# Patient Record
Sex: Female | Born: 1995 | Race: White | Hispanic: No | Marital: Married | Smoking: Never smoker
Health system: Southern US, Community
[De-identification: ages and names within clinical notes are randomized; demographics above are authoritative.]

## PROBLEM LIST (undated history)

## (undated) ENCOUNTER — Inpatient Hospital Stay: Payer: Self-pay

## (undated) DIAGNOSIS — K219 Gastro-esophageal reflux disease without esophagitis: Secondary | ICD-10-CM

## (undated) DIAGNOSIS — M419 Scoliosis, unspecified: Secondary | ICD-10-CM

## (undated) DIAGNOSIS — T753XXA Motion sickness, initial encounter: Secondary | ICD-10-CM

## (undated) DIAGNOSIS — I7300 Raynaud's syndrome without gangrene: Secondary | ICD-10-CM

## (undated) DIAGNOSIS — M5126 Other intervertebral disc displacement, lumbar region: Secondary | ICD-10-CM

## (undated) DIAGNOSIS — G43909 Migraine, unspecified, not intractable, without status migrainosus: Secondary | ICD-10-CM

## (undated) DIAGNOSIS — M238X2 Other internal derangements of left knee: Secondary | ICD-10-CM

## (undated) DIAGNOSIS — O1490 Unspecified pre-eclampsia, unspecified trimester: Secondary | ICD-10-CM

## (undated) DIAGNOSIS — I493 Ventricular premature depolarization: Secondary | ICD-10-CM

## (undated) HISTORY — DX: Scoliosis, unspecified: M41.9

## (undated) HISTORY — DX: Migraine, unspecified, not intractable, without status migrainosus: G43.909

## (undated) HISTORY — PX: DILATION AND CURETTAGE OF UTERUS: SHX78

## (undated) HISTORY — DX: Raynaud's syndrome without gangrene: I73.00

## (undated) HISTORY — PX: WISDOM TOOTH EXTRACTION: SHX21

---
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
## 2014-06-24 ENCOUNTER — Ambulatory Visit (INDEPENDENT_AMBULATORY_CARE_PROVIDER_SITE_OTHER): Payer: 59 | Admitting: Obstetrics and Gynecology

## 2014-06-24 ENCOUNTER — Encounter: Payer: Self-pay | Admitting: Obstetrics and Gynecology

## 2014-06-24 VITALS — BP 118/77 | HR 86 | Ht 67.0 in | Wt 169.8 lb

## 2014-06-24 DIAGNOSIS — R110 Nausea: Secondary | ICD-10-CM | POA: Diagnosis not present

## 2014-06-24 DIAGNOSIS — N912 Amenorrhea, unspecified: Secondary | ICD-10-CM

## 2014-06-24 DIAGNOSIS — Z3201 Encounter for pregnancy test, result positive: Secondary | ICD-10-CM

## 2014-06-24 DIAGNOSIS — N911 Secondary amenorrhea: Secondary | ICD-10-CM

## 2014-06-24 LAB — POCT URINE PREGNANCY: PREG TEST UR: POSITIVE — AB

## 2014-06-24 NOTE — Progress Notes (Signed)
Subjective:    Sydney James is a 19 y.o. P0 female who presents for evaluation of amenorrhea. She believes she could be pregnant. Pregnancy is desired. Sexual Activity: single partner, contraception: none. Current symptoms also include: nausea and positive home pregnancy test. Last period was normal.   Patient's last menstrual period was 05/17/2014.   The following portions of the patient's history were reviewed and updated as appropriate: allergies, current medications, past family history, past medical history, past social history, past surgical history and problem list.  Review of Systems Pertinent items are noted in HPI.     Objective:    BP 118/77 mmHg  Pulse 86  Ht 5\' 7"  (1.702 m)  Wt 169 lb 12.8 oz (77.021 kg)  BMI 26.59 kg/m2  LMP 05/17/2014 General: alert and no acute distress    Lab Review Urine HCG: positive    Assessment:    Absence of menstruation.     Plan:    Pregnancy Test: Positive: EDC: 02/21/2015, ERGA 5.3 weeks.  Briefly discussed pre-natal care options. Encouraged well-balanced diet, plenty of rest when needed, pre-natal vitamins daily and walking for exercise. Discussed self-help for nausea, avoiding OTC medications until consulting provider or pharmacist, other than Tylenol as needed, minimal caffeine (1-2 cups daily) and avoiding alcohol. She will schedule her initial OB visit in the next month Feel free to call with any questions. Patient desires to be seen by midwife.  Will schedule NOB intake and prenatal care accordingly.     Hildred Laser, MD [facility]

## 2014-06-25 DIAGNOSIS — N911 Secondary amenorrhea: Secondary | ICD-10-CM | POA: Insufficient documentation

## 2014-06-25 NOTE — Patient Instructions (Signed)
First Trimester of Pregnancy The first trimester of pregnancy is from week 1 until the end of week 12 (months 1 through 3). A week after a sperm fertilizes an egg, the egg will implant on the wall of the uterus. This embryo will begin to develop into a baby. Genes from you and your partner are forming the baby. The female genes determine whether the baby is a boy or a girl. At 6-8 weeks, the eyes and face are formed, and the heartbeat can be seen on ultrasound. At the end of 12 weeks, all the baby's organs are formed.  Now that you are pregnant, you will want to do everything you can to have a healthy baby. Two of the most important things are to get good prenatal care and to follow your health care provider's instructions. Prenatal care is all the medical care you receive before the baby's birth. This care will help prevent, find, and treat any problems during the pregnancy and childbirth. BODY CHANGES Your body goes through many changes during pregnancy. The changes vary from woman to woman.   You may gain or lose a couple of pounds at first.  You may feel sick to your stomach (nauseous) and throw up (vomit). If the vomiting is uncontrollable, call your health care provider.  You may tire easily.  You may develop headaches that can be relieved by medicines approved by your health care provider.  You may urinate more often. Painful urination may mean you have a bladder infection.  You may develop heartburn as a result of your pregnancy.  You may develop constipation because certain hormones are causing the muscles that push waste through your intestines to slow down.  You may develop hemorrhoids or swollen, bulging veins (varicose veins).  Your breasts may begin to grow larger and become tender. Your nipples may stick out more, and the tissue that surrounds them (areola) may become darker.  Your gums may bleed and may be sensitive to brushing and flossing.  Dark spots or blotches (chloasma,  mask of pregnancy) may develop on your face. This will likely fade after the baby is born.  Your menstrual periods will stop.  You may have a loss of appetite.  You may develop cravings for certain kinds of food.  You may have changes in your emotions from day to day, such as being excited to be pregnant or being concerned that something may go wrong with the pregnancy and baby.  You may have more vivid and strange dreams.  You may have changes in your hair. These can include thickening of your hair, rapid growth, and changes in texture. Some women also have hair loss during or after pregnancy, or hair that feels dry or thin. Your hair will most likely return to normal after your baby is born. WHAT TO EXPECT AT YOUR PRENATAL VISITS During a routine prenatal visit:  You will be weighed to make sure you and the baby are growing normally.  Your blood pressure will be taken.  Your abdomen will be measured to track your baby's growth.  The fetal heartbeat will be listened to starting around week 10 or 12 of your pregnancy.  Test results from any previous visits will be discussed. Your health care provider may ask you:  How you are feeling.  If you are feeling the baby move.  If you have had any abnormal symptoms, such as leaking fluid, bleeding, severe headaches, or abdominal cramping.  If you have any questions. Other tests   that may be performed during your first trimester include:  Blood tests to find your blood type and to check for the presence of any previous infections. They will also be used to check for low iron levels (anemia) and Rh antibodies. Later in the pregnancy, blood tests for diabetes will be done along with other tests if problems develop.  Urine tests to check for infections, diabetes, or protein in the urine.  An ultrasound to confirm the proper growth and development of the baby.  An amniocentesis to check for possible genetic problems.  Fetal screens for  spina bifida and Down syndrome.  You may need other tests to make sure you and the baby are doing well. HOME CARE INSTRUCTIONS  Medicines  Follow your health care provider's instructions regarding medicine use. Specific medicines may be either safe or unsafe to take during pregnancy.  Take your prenatal vitamins as directed.  If you develop constipation, try taking a stool softener if your health care provider approves. Diet  Eat regular, well-balanced meals. Choose a variety of foods, such as meat or vegetable-based protein, fish, milk and low-fat dairy products, vegetables, fruits, and whole grain breads and cereals. Your health care provider will help you determine the amount of weight gain that is right for you.  Avoid raw meat and uncooked cheese. These carry germs that can cause birth defects in the baby.  Eating four or five small meals rather than three large meals a day may help relieve nausea and vomiting. If you start to feel nauseous, eating a few soda crackers can be helpful. Drinking liquids between meals instead of during meals also seems to help nausea and vomiting.  If you develop constipation, eat more high-fiber foods, such as fresh vegetables or fruit and whole grains. Drink enough fluids to keep your urine clear or pale yellow. Activity and Exercise  Exercise only as directed by your health care provider. Exercising will help you:  Control your weight.  Stay in shape.  Be prepared for labor and delivery.  Experiencing pain or cramping in the lower abdomen or low back is a good sign that you should stop exercising. Check with your health care provider before continuing normal exercises.  Try to avoid standing for long periods of time. Move your legs often if you must stand in one place for a long time.  Avoid heavy lifting.  Wear low-heeled shoes, and practice good posture.  You may continue to have sex unless your health care provider directs you  otherwise. Relief of Pain or Discomfort  Wear a good support bra for breast tenderness.   Take warm sitz baths to soothe any pain or discomfort caused by hemorrhoids. Use hemorrhoid cream if your health care provider approves.   Rest with your legs elevated if you have leg cramps or low back pain.  If you develop varicose veins in your legs, wear support hose. Elevate your feet for 15 minutes, 3-4 times a day. Limit salt in your diet. Prenatal Care  Schedule your prenatal visits by the twelfth week of pregnancy. They are usually scheduled monthly at first, then more often in the last 2 months before delivery.  Write down your questions. Take them to your prenatal visits.  Keep all your prenatal visits as directed by your health care provider. Safety  Wear your seat belt at all times when driving.  Make a list of emergency phone numbers, including numbers for family, friends, the hospital, and police and fire departments. General Tips    Ask your health care provider for a referral to a local prenatal education class. Begin classes no later than at the beginning of month 6 of your pregnancy.  Ask for help if you have counseling or nutritional needs during pregnancy. Your health care provider can offer advice or refer you to specialists for help with various needs.  Do not use hot tubs, steam rooms, or saunas.  Do not douche or use tampons or scented sanitary pads.  Do not cross your legs for long periods of time.  Avoid cat litter boxes and soil used by cats. These carry germs that can cause birth defects in the baby and possibly loss of the fetus by miscarriage or stillbirth.  Avoid all smoking, herbs, alcohol, and medicines not prescribed by your health care provider. Chemicals in these affect the formation and growth of the baby.  Schedule a dentist appointment. At home, brush your teeth with a soft toothbrush and be gentle when you floss. SEEK MEDICAL CARE IF:   You have  dizziness.  You have mild pelvic cramps, pelvic pressure, or nagging pain in the abdominal area.  You have persistent nausea, vomiting, or diarrhea.  You have a bad smelling vaginal discharge.  You have pain with urination.  You notice increased swelling in your face, hands, legs, or ankles. SEEK IMMEDIATE MEDICAL CARE IF:   You have a fever.  You are leaking fluid from your vagina.  You have spotting or bleeding from your vagina.  You have severe abdominal cramping or pain.  You have rapid weight gain or loss.  You vomit blood or material that looks like coffee grounds.  You are exposed to German measles and have never had them.  You are exposed to fifth disease or chickenpox.  You develop a severe headache.  You have shortness of breath.  You have any kind of trauma, such as from a fall or a car accident. Document Released: 12/14/2000 Document Revised: 05/06/2013 Document Reviewed: 10/30/2012 ExitCare Patient Information 2015 [employer]. This information is not intended to replace advice given to you by your health care provider. Make sure you discuss any questions you have with your health care provider.  

## 2014-07-15 ENCOUNTER — Ambulatory Visit: Payer: 59 | Admitting: Obstetrics and Gynecology

## 2014-07-15 VITALS — BP 123/73 | HR 83 | Wt 170.1 lb

## 2014-07-15 DIAGNOSIS — Z3491 Encounter for supervision of normal pregnancy, unspecified, first trimester: Secondary | ICD-10-CM

## 2014-07-15 LAB — OB RESULTS CONSOLE HEPATITIS B SURFACE ANTIGEN: Hepatitis B Surface Ag: NEGATIVE

## 2014-07-15 LAB — OB RESULTS CONSOLE GC/CHLAMYDIA
CHLAMYDIA, DNA PROBE: NEGATIVE
Gonorrhea: NEGATIVE

## 2014-07-15 LAB — OB RESULTS CONSOLE HIV ANTIBODY (ROUTINE TESTING): HIV: NONREACTIVE

## 2014-07-15 LAB — OB RESULTS CONSOLE ABO/RH: RH TYPE: POSITIVE

## 2014-07-15 LAB — OB RESULTS CONSOLE VARICELLA ZOSTER ANTIBODY, IGG: Varicella: IMMUNE

## 2014-07-15 LAB — OB RESULTS CONSOLE RUBELLA ANTIBODY, IGM: Rubella: IMMUNE

## 2014-07-15 NOTE — Progress Notes (Cosign Needed)
Pt is here for NOB nurse intake, all info reviewed with pt, pt is having some nausea declined medication at this time Will review info and let our office know if desires any 1st trimester screening test

## 2014-07-16 ENCOUNTER — Telehealth: Payer: Self-pay | Admitting: Obstetrics and Gynecology

## 2014-07-16 DIAGNOSIS — O219 Vomiting of pregnancy, unspecified: Secondary | ICD-10-CM

## 2014-07-16 LAB — CBC WITH DIFFERENTIAL/PLATELET
BASOS: 0 %
Basophils Absolute: 0 10*3/uL (ref 0.0–0.2)
EOS (ABSOLUTE): 0.2 10*3/uL (ref 0.0–0.4)
EOS: 2 %
Hematocrit: 39.4 % (ref 34.0–46.6)
Hemoglobin: 13.2 g/dL (ref 11.1–15.9)
IMMATURE GRANULOCYTES: 0 %
Immature Grans (Abs): 0 10*3/uL (ref 0.0–0.1)
LYMPHS ABS: 2.9 10*3/uL (ref 0.7–3.1)
Lymphs: 28 %
MCH: 29.5 pg (ref 26.6–33.0)
MCHC: 33.5 g/dL (ref 31.5–35.7)
MCV: 88 fL (ref 79–97)
Monocytes Absolute: 1 10*3/uL — ABNORMAL HIGH (ref 0.1–0.9)
Monocytes: 9 %
Neutrophils Absolute: 6.2 10*3/uL (ref 1.4–7.0)
Neutrophils: 61 %
PLATELETS: 317 10*3/uL (ref 150–379)
RBC: 4.47 x10E6/uL (ref 3.77–5.28)
RDW: 14 % (ref 12.3–15.4)
WBC: 10.3 10*3/uL (ref 3.4–10.8)

## 2014-07-16 LAB — URINALYSIS, ROUTINE W REFLEX MICROSCOPIC
BILIRUBIN UA: NEGATIVE
Glucose, UA: NEGATIVE
KETONES UA: NEGATIVE
Leukocytes, UA: NEGATIVE
Nitrite, UA: NEGATIVE
PH UA: 7 (ref 5.0–7.5)
PROTEIN UA: NEGATIVE
RBC, UA: NEGATIVE
Specific Gravity, UA: 1.022 (ref 1.005–1.030)
Urobilinogen, Ur: 0.2 mg/dL (ref 0.2–1.0)

## 2014-07-16 LAB — ABO AND RH: RH TYPE: POSITIVE

## 2014-07-16 LAB — ANTIBODY SCREEN: Antibody Screen: NEGATIVE

## 2014-07-16 MED ORDER — DOXYLAMINE-PYRIDOXINE 10-10 MG PO TBEC: 10.0000 mg | DELAYED_RELEASE_TABLET | Freq: Every day | ORAL | Status: DC

## 2014-07-16 NOTE — Telephone Encounter (Signed)
8 WK 5 DAYS PREGNANT, PT IS VERY NAUSEATED/ AND VOMITTING. SHE WOULD LIKE RX FOR IT. ([facility] [address])

## 2014-07-16 NOTE — Telephone Encounter (Signed)
RX for diclegis sent in. Please inform pt.

## 2014-07-17 LAB — TOXOPLASMA ANTIBODIES- IGG AND  IGM
Toxoplasma Antibody- IgM: <3 AU/mL (ref 0.0–7.9)
Toxoplasma IgG Ratio: <3 IU/mL (ref 0.0–7.1)

## 2014-07-17 LAB — RUBELLA SCREEN: Rubella Antibodies, IGG: 1.81 index (ref >0.99)

## 2014-07-17 LAB — GC/CHLAMYDIA PROBE AMP
Chlamydia trachomatis, NAA: NEGATIVE
NEISSERIA GONORRHOEAE BY PCR: NEGATIVE

## 2014-07-17 LAB — HEP, RPR, HIV PANEL: RPR: NONREACTIVE

## 2014-07-17 LAB — URINE CULTURE

## 2014-07-17 LAB — VARICELLA ZOSTER ANTIBODY, IGG: VARICELLA: 1339 {index} (ref >165)

## 2014-08-07 ENCOUNTER — Ambulatory Visit (INDEPENDENT_AMBULATORY_CARE_PROVIDER_SITE_OTHER): Payer: 59 | Admitting: Obstetrics and Gynecology

## 2014-08-07 ENCOUNTER — Encounter: Payer: Self-pay | Admitting: Obstetrics and Gynecology

## 2014-08-07 VITALS — BP 125/84 | HR 84 | Wt 169.3 lb

## 2014-08-07 DIAGNOSIS — Z3491 Encounter for supervision of normal pregnancy, unspecified, first trimester: Secondary | ICD-10-CM

## 2014-08-07 LAB — POCT URINALYSIS DIPSTICK
Bilirubin, UA: NEGATIVE
GLUCOSE UA: NEGATIVE
Ketones, UA: 5
LEUKOCYTES UA: NEGATIVE
NITRITE UA: NEGATIVE
PH UA: 5
Protein, UA: NEGATIVE
SPEC GRAV UA: 1.015
Urobilinogen, UA: 0.2

## 2014-08-07 NOTE — Progress Notes (Signed)
NEW OB HISTORY AND PHYSICAL  SUBJECTIVE:       Sydney James is a 19 y.o. G1P0 female, Patient's last menstrual period was 05/17/2014 (exact date)., Estimated Date of Delivery: 02/21/15, [redacted]w[redacted]d, presents today for establishment of Prenatal Care. She has no unusual complaints and complains of nausea with vomiting for >30 days      Gynecologic History Patient's last menstrual period was 05/17/2014 (exact date). Normal Contraception: none Last Pap: NA. Results were: NA  Obstetric History OB History  Gravida Para Term Preterm AB SAB TAB Ectopic Multiple Living  1             # Outcome Date GA Lbr Len/2nd Weight Sex Delivery Anes PTL Lv  1 Current               Past Medical History  Diagnosis Date  . Scoliosis     Back brace for 2 years  . Migraine     History reviewed. No pertinent past surgical history.  Current Outpatient Prescriptions on File Prior to Visit  Medication Sig Dispense Refill  . Prenatal Vit-Fe Fumarate-FA (MULTIVITAMIN-PRENATAL) 27-0.8 MG TABS tablet Take 1 tablet by mouth daily at 12 noon.    . Doxylamine-Pyridoxine 10-10 MG TBEC Take 10 mg by mouth at bedtime. Take 2 tablets at bedtime, if no relief may increase to taking 1 in the am, and 1 at noon (Patient not taking: Reported on 08/07/2014) 60 tablet 3   No current facility-administered medications on file prior to visit.    Allergies  Allergen Reactions  . Aloe   . Lubricants     Has to have water based lubricant  . Tape     History   Social History  . Marital Status: Married    Spouse Name: N/A  . Number of Children: N/A  . Years of Education: N/A   Occupational History  . Not on file.   Social History Main Topics  . Smoking status: Never Smoker   . Smokeless tobacco: Never Used  . Alcohol Use: No  . Drug Use: No  . Sexual Activity: Yes    Birth Control/ Protection: None     Comment: Pregnant   Other Topics Concern  . Not on file   Social History Narrative    Family History   Problem Relation Age of Onset  . Hypertension Father   . Hypertension Mother     The following portions of the patient's history were reviewed and updated as appropriate: allergies, current medications, past OB history, past medical history, past surgical history, past family history, past social history, and problem list.    OBJECTIVE: Initial Physical Exam (New OB)  GENERAL APPEARANCE: alert, well appearing, in no apparent distress, oriented to person, place and time, well hydrated HEAD: normocephalic, atraumatic MOUTH: mucous membranes moist, pharynx normal without lesions THYROID: no thyromegaly or masses present BREASTS: no masses noted, no significant tenderness, no palpable axillary nodes, no skin changes LUNGS: clear to auscultation, no wheezes, rales or rhonchi, symmetric air entry HEART: regular rate and rhythm, no murmurs ABDOMEN: soft, nontender, nondistended, no abnormal masses, no epigastric pain EXTREMITIES: no redness or tenderness in the calves or thighs SKIN: normal coloration and turgor, no rashes LYMPH NODES: no adenopathy palpable NEUROLOGIC: alert, oriented, normal speech, no focal findings or movement disorder noted  PELVIC EXAM EXTERNAL GENITALIA: normal appearing vulva with no masses, tenderness or lesions VAGINA: no abnormal discharge or lesions CERVIX: no lesions or cervical motion tenderness UTERUS: gravid and consistent  with 12 weeks ADNEXA: no masses palpable and nontender  ASSESSMENT: Normal pregnancy  PLAN:  [employer] obtained Prenatal care See orders

## 2014-08-07 NOTE — Progress Notes (Signed)
Pt is having some nausea, has Rx at pharmacy just hasnt picked it up

## 2014-08-08 ENCOUNTER — Other Ambulatory Visit: Payer: Self-pay | Admitting: Obstetrics and Gynecology

## 2014-08-08 MED ORDER — ONDANSETRON 4 MG PO TBDP: 4.0000 mg | ORAL_TABLET | Freq: Four times a day (QID) | ORAL | Status: DC | PRN

## 2014-08-15 ENCOUNTER — Encounter: Payer: Self-pay | Admitting: Obstetrics and Gynecology

## 2014-08-19 LAB — CYSTIC FIBROSIS DIAGNOSTIC STUDY: Interpretation-CFDNA:: NEGATIVE

## 2014-08-22 ENCOUNTER — Encounter: Payer: Self-pay | Admitting: Obstetrics and Gynecology

## 2014-08-26 ENCOUNTER — Encounter: Payer: Self-pay | Admitting: Emergency Medicine

## 2014-08-26 ENCOUNTER — Emergency Department
Admission: EM | Admit: 2014-08-26 | Discharge: 2014-08-26 | Disposition: A | Discharge status: Home / Self Care | Payer: 59 | Attending: Emergency Medicine | Admitting: Emergency Medicine

## 2014-08-26 DIAGNOSIS — O210 Mild hyperemesis gravidarum: Secondary | ICD-10-CM | POA: Diagnosis not present

## 2014-08-26 DIAGNOSIS — Z3A14 14 weeks gestation of pregnancy: Secondary | ICD-10-CM | POA: Insufficient documentation

## 2014-08-26 LAB — URINALYSIS COMPLETE WITH MICROSCOPIC (ARMC ONLY)
BILIRUBIN URINE: NEGATIVE
Glucose, UA: NEGATIVE mg/dL
Hgb urine dipstick: NEGATIVE
KETONES UR: NEGATIVE mg/dL
LEUKOCYTES UA: NEGATIVE
Nitrite: NEGATIVE
PH: 6 (ref 5.0–8.0)
PROTEIN: NEGATIVE mg/dL
SPECIFIC GRAVITY, URINE: 1.008 (ref 1.005–1.030)

## 2014-08-26 LAB — CBC
HEMATOCRIT: 38.3 % (ref 35.0–47.0)
HEMOGLOBIN: 12.7 g/dL (ref 12.0–16.0)
MCH: 29.3 pg (ref 26.0–34.0)
MCHC: 33.2 g/dL (ref 32.0–36.0)
MCV: 88 fL (ref 80.0–100.0)
Platelets: 249 10*3/uL (ref 150–440)
RBC: 4.35 MIL/uL (ref 3.80–5.20)
RDW: 13.1 % (ref 11.5–14.5)
WBC: 10.7 10*3/uL (ref 3.6–11.0)

## 2014-08-26 LAB — COMPREHENSIVE METABOLIC PANEL
ALK PHOS: 59 U/L (ref 38–126)
ALT: 17 U/L (ref 14–54)
ANION GAP: 9 (ref 5–15)
AST: 21 U/L (ref 15–41)
Albumin: 3.9 g/dL (ref 3.5–5.0)
BILIRUBIN TOTAL: 0.2 mg/dL — AB (ref 0.3–1.2)
BUN: 8 mg/dL (ref 6–20)
CALCIUM: 9.3 mg/dL (ref 8.9–10.3)
CO2: 24 mmol/L (ref 22–32)
Chloride: 104 mmol/L (ref 101–111)
Creatinine, Ser: 0.51 mg/dL (ref 0.44–1.00)
GLUCOSE: 99 mg/dL (ref 65–99)
POTASSIUM: 3.3 mmol/L — AB (ref 3.5–5.1)
Sodium: 137 mmol/L (ref 135–145)
TOTAL PROTEIN: 7.4 g/dL (ref 6.5–8.1)

## 2014-08-26 MED ORDER — METOCLOPRAMIDE HCL 5 MG/ML IJ SOLN
10.0000 mg | Freq: Once | INTRAMUSCULAR | Status: AC
  Administered 2014-08-26: 10 mg via INTRAVENOUS
  Filled 2014-08-26: qty 2

## 2014-08-26 MED ORDER — SODIUM CHLORIDE 0.9 % IV SOLN
Freq: Once | INTRAVENOUS | Status: AC
  Administered 2014-08-26: 19:00:00 via INTRAVENOUS

## 2014-08-26 MED ORDER — METOCLOPRAMIDE HCL 10 MG PO TABS: 10.0000 mg | ORAL_TABLET | Freq: Four times a day (QID) | ORAL | Status: DC | PRN

## 2014-08-26 NOTE — ED Notes (Signed)
Pt to ed with c/o vomiting x 2 days, unable to hold fluids down for last 2 days.  Pt reports she is approx [redacted] weeks pregnant.  Reports she has tried diclegis without relief.

## 2014-08-26 NOTE — Discharge Instructions (Signed)
Hyperemesis Gravidarum °Hyperemesis gravidarum is a severe form of nausea and vomiting that happens during pregnancy. Hyperemesis is worse than morning sickness. It may cause you to have nausea or vomiting all day for many days. It may keep you from eating and drinking enough food and liquids. Hyperemesis usually occurs during the first half (the first 20 weeks) of pregnancy. It often goes away once a woman is in her second half of pregnancy. However, sometimes hyperemesis continues through an entire pregnancy.  °CAUSES  °The cause of this condition is not completely known but is thought to be related to changes in the body's hormones when pregnant. It could be from the high level of the pregnancy hormone or an increase in estrogen in the body.  °SIGNS AND SYMPTOMS  °· Severe nausea and vomiting. °· Nausea that does not go away. °· Vomiting that does not allow you to keep any food down. °· Weight loss and body fluid loss (dehydration). °· Having no desire to eat or not liking food you have previously enjoyed. °DIAGNOSIS  °Your health care provider will do a physical exam and ask you about your symptoms. He or she may also order blood tests and urine tests to make sure something else is not causing the problem.  °TREATMENT  °You may only need medicine to control the problem. If medicines do not control the nausea and vomiting, you will be treated in the hospital to prevent dehydration, increased acid in the blood (acidosis), weight loss, and changes in the electrolytes in your body that may harm the unborn baby (fetus). You may need IV fluids.  °HOME CARE INSTRUCTIONS  °· Only take over-the-counter or prescription medicines as directed by your health care provider. °· Try eating a couple of dry crackers or toast in the morning before getting out of bed. °· Avoid foods and smells that upset your stomach. °· Avoid fatty and spicy foods. °· Eat 5-6 small meals a day. °· Do not drink when eating meals. Drink between  meals. °· For snacks, eat high-protein foods, such as cheese. °· Eat or suck on things that have ginger in them. Ginger helps nausea. °· Avoid food preparation. The smell of food can spoil your appetite. °· Avoid iron pills and iron in your multivitamins until after 3-4 months of being pregnant. However, consult with your health care provider before stopping any prescribed iron pills. °SEEK MEDICAL CARE IF:  °· Your abdominal pain increases. °· You have a severe headache. °· You have vision problems. °· You are losing weight. °SEEK IMMEDIATE MEDICAL CARE IF:  °· You are unable to keep fluids down. °· You vomit blood. °· You have constant nausea and vomiting. °· You have excessive weakness. °· You have extreme thirst. °· You have dizziness or fainting. °· You have a fever or persistent symptoms for more than 2-3 days. °· You have a fever and your symptoms suddenly get worse. °MAKE SURE YOU:  °· Understand these instructions. °· Will watch your condition. °· Will get help right away if you are not doing well or get worse. °Document Released: 12/20/2004 Document Revised: 10/10/2012 Document Reviewed: 08/01/2012 °ExitCare® Patient Information ©2015 [employer]. This information is not intended to replace advice given to you by your health care provider. Make sure you discuss any questions you have with your health care provider. ° °

## 2014-08-26 NOTE — ED Provider Notes (Signed)
[facility] Provider Note  ____________________________________________  Time seen: Approximately 650 PM  I have reviewed the triage vital signs and the nursing notes.   HISTORY  Chief Complaint Emesis During Pregnancy    HPI Sydney James is a 19 y.o. female who is [redacted] weeks pregnant who is coming in with 3 days of worsening nausea and vomiting. The patient says that she is unable to keep any fluids or solids down. Is taking Dike Cletus at home without any relief. Denies any abdominal pain, vaginal discharge or bleeding. She is a G1. Has follow-up with an obstetrician gynecologist. She is also taking prenatal vitamins.Fetal heart tones documented at 146.   Past Medical History  Diagnosis Date  . Scoliosis     Back brace for 2 years  . Migraine     Patient Active Problem List   Diagnosis Date Noted  . Amenorrhea, secondary 06/25/2014    History reviewed. No pertinent past surgical history.  Current Outpatient Rx  Name  Route  Sig  Dispense  Refill  . Doxylamine-Pyridoxine 10-10 MG TBEC   Oral   Take 10 mg by mouth at bedtime. Take 2 tablets at bedtime, if no relief may increase to taking 1 in the am, and 1 at noon Patient not taking: Reported on 08/07/2014   60 tablet   3   . ondansetron (ZOFRAN ODT) 4 MG disintegrating tablet   Oral   Take 1 tablet (4 mg total) by mouth every 6 (six) hours as needed for nausea.   20 tablet   0   . Prenatal Vit-Fe Fumarate-FA (MULTIVITAMIN-PRENATAL) 27-0.8 MG TABS tablet   Oral   Take 1 tablet by mouth daily at 12 noon.           Allergies Aloe; Lubricants; and Tape  Family History  Problem Relation Age of Onset  . Hypertension Father   . Hypertension Mother     Social History Social History  Substance Use Topics  . Smoking status: Never Smoker   . Smokeless tobacco: Never Used  . Alcohol Use: No    Review of Systems Constitutional: No fever/chills Eyes: No visual  changes. ENT: No sore throat. Cardiovascular: Denies chest pain. Respiratory: Denies shortness of breath. Gastrointestinal: No abdominal pain. No diarrhea.  No constipation. Genitourinary: Negative for dysuria. Musculoskeletal: Negative for back pain. Skin: Negative for rash. Neurological: Negative for headaches, focal weakness or numbness.  10-point ROS otherwise negative.  ____________________________________________   PHYSICAL EXAM:  VITAL SIGNS: ED Triage Vitals  Enc Vitals Group     BP 08/26/14 1752 125/81 mmHg     Pulse Rate 08/26/14 1752 87     Resp 08/26/14 1752 20     Temp 08/26/14 1752 97.7 F (36.5 C)     Temp Source 08/26/14 1752 Oral     SpO2 08/26/14 1752 98 %     Weight 08/26/14 1752 158 lb (71.668 kg)     Height 08/26/14 1752  (1.702 m)     Head Cir --      Peak Flow --      Pain Score 08/26/14 1753 0     Pain Loc --      Pain Edu? --      Excl. in GC? --     Constitutional: Alert and oriented. Well appearing and in no acute distress. Eyes: Conjunctivae are normal. PERRL. EOMI. Head: Atraumatic. Nose: No congestion/rhinnorhea. Mouth/Throat: Mucous membranes are moist.  Oropharynx non-erythematous. Neck: No stridor.  Cardiovascular: Normal rate, regular rhythm. Grossly normal heart sounds.  Good peripheral circulation. Respiratory: Normal respiratory effort.  No retractions. Lungs CTAB. Gastrointestinal: Soft and nontender. No distention. No abdominal bruits. No CVA tenderness. Musculoskeletal: No lower extremity tenderness nor edema.  No joint effusions. Neurologic:  Normal speech and language. No gross focal neurologic deficits are appreciated. No gait instability. Skin:  Skin is warm, dry and intact. No rash noted. Psychiatric: Mood and affect are normal. Speech and behavior are normal.  ____________________________________________   LABS (all labs ordered are listed, but only abnormal results are displayed)  Labs Reviewed   COMPREHENSIVE METABOLIC PANEL - Abnormal; Notable for the following:    Potassium 3.3 (*)    Total Bilirubin 0.2 (*)    All other components within normal limits  URINALYSIS COMPLETEWITH MICROSCOPIC ([facility] ONLY) - Abnormal; Notable for the following:    Color, Urine YELLOW (*)    APPearance HAZY (*)    Bacteria, UA RARE (*)    Squamous Epithelial / LPF 6-30 (*)    All other components within normal limits  CBC   ____________________________________________  EKG   ____________________________________________  RADIOLOGY   ____________________________________________   PROCEDURES   ____________________________________________   INITIAL IMPRESSION / ASSESSMENT AND PLAN / ED COURSE  Pertinent labs & imaging results that were available during my care of the patient were reviewed by me and considered in my medical decision making (see chart for details).  ----------------------------------------- 8:04 PM on 08/26/2014 -----------------------------------------  Patient without any nausea at this time. Now tolerating crackers and juice. To continue diclegis and prenatal vitamins at home.  Will rx reglan prn.  To follow up with ob/gyn as scheduled on September 1. ____________________________________________   FINAL CLINICAL IMPRESSION(S) / ED DIAGNOSES  Acute hyperemesis gravidarum, resolved. Initial visit.    Myrna Blazer, MD 08/26/14 2006

## 2014-09-01 ENCOUNTER — Telehealth: Payer: Self-pay | Admitting: Obstetrics and Gynecology

## 2014-09-01 NOTE — Telephone Encounter (Signed)
Pt called and she has not had an ultrasound since she started coming her, she is about 15 weeks her next appt is 9/1. She has talked to other people that come there and they have said they got an ultrasound sooner and she wanted to know if she could get one just to see the baby, I explained to her that Insurance will not pay for her to have an [place] if she just wants to see the baby and I explained to her that she will probably have one in 4 weeks after her 9/1 visit due to it being time for her anatomy scan and I also explained to her that every provider here is different and taht you do not do an [place] unless its medically necessary. Pt is not having any problems no bleeding or cramping she just wants to see the baby.

## 2014-09-04 ENCOUNTER — Ambulatory Visit (INDEPENDENT_AMBULATORY_CARE_PROVIDER_SITE_OTHER): Payer: 59 | Admitting: Obstetrics and Gynecology

## 2014-09-04 ENCOUNTER — Encounter: Payer: Self-pay | Admitting: Obstetrics and Gynecology

## 2014-09-04 VITALS — BP 115/52 | HR 75 | Wt 167.8 lb

## 2014-09-04 DIAGNOSIS — Z331 Pregnant state, incidental: Secondary | ICD-10-CM

## 2014-09-04 LAB — POCT URINALYSIS DIPSTICK
Bilirubin, UA: NEGATIVE
Blood, UA: NEGATIVE
Glucose, UA: NEGATIVE
Ketones, UA: NEGATIVE
LEUKOCYTES UA: NEGATIVE
NITRITE UA: NEGATIVE
PH UA: 6
PROTEIN UA: NEGATIVE
Spec Grav, UA: 1.015
Urobilinogen, UA: 0.2

## 2014-09-04 NOTE — Progress Notes (Signed)
[name]-only needing Zofran 1-2 x day, to try to increase water intake; reviewed labs all normal, anatomy scan next visit.

## 2014-09-04 NOTE — Progress Notes (Signed)
[name]-still having some nausea, is taking the Zofran, headaches, is having some constipation

## 2014-09-04 NOTE — Patient Instructions (Signed)
Second Trimester of Pregnancy The second trimester is from week 13 through week 28, months 4 through 6. The second trimester is often a time when you feel your best. Your body has also adjusted to being pregnant, and you begin to feel better physically. Usually, morning sickness has lessened or quit completely, you may have more energy, and you may have an increase in appetite. The second trimester is also a time when the fetus is growing rapidly. At the end of the sixth month, the fetus is about 9 inches long and weighs about 1 pounds. You will likely begin to feel the baby move (quickening) between 18 and 20 weeks of the pregnancy. BODY CHANGES Your body goes through many changes during pregnancy. The changes vary from woman to woman.   Your weight will continue to increase. You will notice your lower abdomen bulging out.  You may begin to get stretch marks on your hips, abdomen, and breasts.  You may develop headaches that can be relieved by medicines approved by your health care provider.  You may urinate more often because the fetus is pressing on your bladder.  You may develop or continue to have heartburn as a result of your pregnancy.  You may develop constipation because certain hormones are causing the muscles that push waste through your intestines to slow down.  You may develop hemorrhoids or swollen, bulging veins (varicose veins).  You may have back pain because of the weight gain and pregnancy hormones relaxing your joints between the bones in your pelvis and as a result of a shift in weight and the muscles that support your balance.  Your breasts will continue to grow and be tender.  Your gums may bleed and may be sensitive to brushing and flossing.  Dark spots or blotches (chloasma, mask of pregnancy) may develop on your face. This will likely fade after the baby is born.  A dark line from your belly button to the pubic area (linea nigra) may appear. This will likely fade  after the baby is born.  You may have changes in your hair. These can include thickening of your hair, rapid growth, and changes in texture. Some women also have hair loss during or after pregnancy, or hair that feels dry or thin. Your hair will most likely return to normal after your baby is born. WHAT TO EXPECT AT YOUR PRENATAL VISITS During a routine prenatal visit:  You will be weighed to make sure you and the fetus are growing normally.  Your blood pressure will be taken.  Your abdomen will be measured to track your baby's growth.  The fetal heartbeat will be listened to.  Any test results from the previous visit will be discussed. Your health care provider may ask you:  How you are feeling.  If you are feeling the baby move.  If you have had any abnormal symptoms, such as leaking fluid, bleeding, severe headaches, or abdominal cramping.  If you have any questions. Other tests that may be performed during your second trimester include:  Blood tests that check for:  Low iron levels (anemia).  Gestational diabetes (between 24 and 28 weeks).  Rh antibodies.  Urine tests to check for infections, diabetes, or protein in the urine.  An ultrasound to confirm the proper growth and development of the baby.  An amniocentesis to check for possible genetic problems.  Fetal screens for spina bifida and Down syndrome. HOME CARE INSTRUCTIONS   Avoid all smoking, herbs, alcohol, and unprescribed   drugs. These chemicals affect the formation and growth of the baby.  Follow your health care provider's instructions regarding medicine use. There are medicines that are either safe or unsafe to take during pregnancy.  Exercise only as directed by your health care provider. Experiencing uterine cramps is a good sign to stop exercising.  Continue to eat regular, healthy meals.  Wear a good support bra for breast tenderness.  Do not use hot tubs, steam rooms, or saunas.  Wear your  seat belt at all times when driving.  Avoid raw meat, uncooked cheese, cat litter boxes, and soil used by cats. These carry germs that can cause birth defects in the baby.  Take your prenatal vitamins.  Try taking a stool softener (if your health care provider approves) if you develop constipation. Eat more high-fiber foods, such as fresh vegetables or fruit and whole grains. Drink plenty of fluids to keep your urine clear or pale yellow.  Take warm sitz baths to soothe any pain or discomfort caused by hemorrhoids. Use hemorrhoid cream if your health care provider approves.  If you develop varicose veins, wear support hose. Elevate your feet for 15 minutes, 3-4 times a day. Limit salt in your diet.  Avoid heavy lifting, wear low heel shoes, and practice good posture.  Rest with your legs elevated if you have leg cramps or low back pain.  Visit your dentist if you have not gone yet during your pregnancy. Use a soft toothbrush to brush your teeth and be gentle when you floss.  A sexual relationship may be continued unless your health care provider directs you otherwise.  Continue to go to all your prenatal visits as directed by your health care provider. SEEK MEDICAL CARE IF:   You have dizziness.  You have mild pelvic cramps, pelvic pressure, or nagging pain in the abdominal area.  You have persistent nausea, vomiting, or diarrhea.  You have a bad smelling vaginal discharge.  You have pain with urination. SEEK IMMEDIATE MEDICAL CARE IF:   You have a fever.  You are leaking fluid from your vagina.  You have spotting or bleeding from your vagina.  You have severe abdominal cramping or pain.  You have rapid weight gain or loss.  You have shortness of breath with chest pain.  You notice sudden or extreme swelling of your face, hands, ankles, feet, or legs.  You have not felt your baby move in over an hour.  You have severe headaches that do not go away with  medicine.  You have vision changes. Document Released: 12/14/2000 Document Revised: 12/25/2012 Document Reviewed: 02/21/2012 ExitCare Patient Information 2015 [employer]. This information is not intended to replace advice given to you by your health care provider. Make sure you discuss any questions you have with your health care provider.  

## 2014-09-15 ENCOUNTER — Ambulatory Visit (INDEPENDENT_AMBULATORY_CARE_PROVIDER_SITE_OTHER): Payer: 59 | Admitting: Obstetrics and Gynecology

## 2014-09-15 DIAGNOSIS — Z23 Encounter for immunization: Secondary | ICD-10-CM

## 2014-09-25 ENCOUNTER — Other Ambulatory Visit: Payer: Self-pay | Admitting: *Deleted

## 2014-09-25 MED ORDER — ONDANSETRON 4 MG PO TBDP: 4.0000 mg | ORAL_TABLET | Freq: Four times a day (QID) | ORAL | Status: DC | PRN

## 2014-09-30 ENCOUNTER — Ambulatory Visit: Payer: 59

## 2014-09-30 ENCOUNTER — Encounter: Payer: Self-pay | Admitting: Obstetrics and Gynecology

## 2014-09-30 ENCOUNTER — Ambulatory Visit (INDEPENDENT_AMBULATORY_CARE_PROVIDER_SITE_OTHER): Payer: 59 | Admitting: Obstetrics and Gynecology

## 2014-09-30 VITALS — BP 105/72 | HR 76 | Wt 170.6 lb

## 2014-09-30 DIAGNOSIS — Z3492 Encounter for supervision of normal pregnancy, unspecified, second trimester: Secondary | ICD-10-CM

## 2014-09-30 DIAGNOSIS — Z331 Pregnant state, incidental: Secondary | ICD-10-CM | POA: Diagnosis not present

## 2014-09-30 LAB — POCT URINALYSIS DIPSTICK
Bilirubin, UA: NEGATIVE
Glucose, UA: NEGATIVE
Ketones, UA: NEGATIVE
Leukocytes, UA: NEGATIVE
NITRITE UA: NEGATIVE
PH UA: 7
PROTEIN UA: NEGATIVE
RBC UA: NEGATIVE
SPEC GRAV UA: 1.015
UROBILINOGEN UA: 0.2

## 2014-09-30 NOTE — Patient Instructions (Signed)
Second Trimester of Pregnancy The second trimester is from week 13 through week 28, months 4 through 6. The second trimester is often a time when you feel your best. Your body has also adjusted to being pregnant, and you begin to feel better physically. Usually, morning sickness has lessened or quit completely, you may have more energy, and you may have an increase in appetite. The second trimester is also a time when the fetus is growing rapidly. At the end of the sixth month, the fetus is about 9 inches long and weighs about 1 pounds. You will likely begin to feel the baby move (quickening) between 18 and 20 weeks of the pregnancy. BODY CHANGES Your body goes through many changes during pregnancy. The changes vary from woman to woman.   Your weight will continue to increase. You will notice your lower abdomen bulging out.  You may begin to get stretch marks on your hips, abdomen, and breasts.  You may develop headaches that can be relieved by medicines approved by your health care provider.  You may urinate more often because the fetus is pressing on your bladder.  You may develop or continue to have heartburn as a result of your pregnancy.  You may develop constipation because certain hormones are causing the muscles that push waste through your intestines to slow down.  You may develop hemorrhoids or swollen, bulging veins (varicose veins).  You may have back pain because of the weight gain and pregnancy hormones relaxing your joints between the bones in your pelvis and as a result of a shift in weight and the muscles that support your balance.  Your breasts will continue to grow and be tender.  Your gums may bleed and may be sensitive to brushing and flossing.  Dark spots or blotches (chloasma, mask of pregnancy) may develop on your face. This will likely fade after the baby is born.  A dark line from your belly button to the pubic area (linea nigra) may appear. This will likely fade  after the baby is born.  You may have changes in your hair. These can include thickening of your hair, rapid growth, and changes in texture. Some women also have hair loss during or after pregnancy, or hair that feels dry or thin. Your hair will most likely return to normal after your baby is born. WHAT TO EXPECT AT YOUR PRENATAL VISITS During a routine prenatal visit:  You will be weighed to make sure you and the fetus are growing normally.  Your blood pressure will be taken.  Your abdomen will be measured to track your baby's growth.  The fetal heartbeat will be listened to.  Any test results from the previous visit will be discussed. Your health care provider may ask you:  How you are feeling.  If you are feeling the baby move.  If you have had any abnormal symptoms, such as leaking fluid, bleeding, severe headaches, or abdominal cramping.  If you have any questions. Other tests that may be performed during your second trimester include:  Blood tests that check for:  Low iron levels (anemia).  Gestational diabetes (between 24 and 28 weeks).  Rh antibodies.  Urine tests to check for infections, diabetes, or protein in the urine.  An ultrasound to confirm the proper growth and development of the baby.  An amniocentesis to check for possible genetic problems.  Fetal screens for spina bifida and Down syndrome. HOME CARE INSTRUCTIONS   Avoid all smoking, herbs, alcohol, and unprescribed   drugs. These chemicals affect the formation and growth of the baby.  Follow your health care provider's instructions regarding medicine use. There are medicines that are either safe or unsafe to take during pregnancy.  Exercise only as directed by your health care provider. Experiencing uterine cramps is a good sign to stop exercising.  Continue to eat regular, healthy meals.  Wear a good support bra for breast tenderness.  Do not use hot tubs, steam rooms, or saunas.  Wear your  seat belt at all times when driving.  Avoid raw meat, uncooked cheese, cat litter boxes, and soil used by cats. These carry germs that can cause birth defects in the baby.  Take your prenatal vitamins.  Try taking a stool softener (if your health care provider approves) if you develop constipation. Eat more high-fiber foods, such as fresh vegetables or fruit and whole grains. Drink plenty of fluids to keep your urine clear or pale yellow.  Take warm sitz baths to soothe any pain or discomfort caused by hemorrhoids. Use hemorrhoid cream if your health care provider approves.  If you develop varicose veins, wear support hose. Elevate your feet for 15 minutes, 3-4 times a day. Limit salt in your diet.  Avoid heavy lifting, wear low heel shoes, and practice good posture.  Rest with your legs elevated if you have leg cramps or low back pain.  Visit your dentist if you have not gone yet during your pregnancy. Use a soft toothbrush to brush your teeth and be gentle when you floss.  A sexual relationship may be continued unless your health care provider directs you otherwise.  Continue to go to all your prenatal visits as directed by your health care provider. SEEK MEDICAL CARE IF:   You have dizziness.  You have mild pelvic cramps, pelvic pressure, or nagging pain in the abdominal area.  You have persistent nausea, vomiting, or diarrhea.  You have a bad smelling vaginal discharge.  You have pain with urination. SEEK IMMEDIATE MEDICAL CARE IF:   You have a fever.  You are leaking fluid from your vagina.  You have spotting or bleeding from your vagina.  You have severe abdominal cramping or pain.  You have rapid weight gain or loss.  You have shortness of breath with chest pain.  You notice sudden or extreme swelling of your face, hands, ankles, feet, or legs.  You have not felt your baby move in over an hour.  You have severe headaches that do not go away with  medicine.  You have vision changes. Document Released: 12/14/2000 Document Revised: 12/25/2012 Document Reviewed: 02/21/2012 ExitCare Patient Information 2015 [employer]. This information is not intended to replace advice given to you by your health care provider. Make sure you discuss any questions you have with your health care provider.  

## 2014-09-30 NOTE — Progress Notes (Signed)
ULTRASOUND REPORT  Location: [facility] Date of Service:   Indications:Anatomy U/S Findings:  Singleton intrauterine pregnancy is visualized with FHR at 152 BPM. Biometrics give an (U/S) Gestational age of 36 3/7 weeks and an (U/S) EDD of 02/28/2015; this correlates with the clinically established EDD of 02/21/2015.  Fetal presentation is Vertex.  EFW: 242 g, 9 oz. Placenta: posterior and remote from the cervix. AFI: appears adequate.  Anatomic survey is incomplete and normal; Gender - female  .   Left kidney is suboptimal- probable visualization of renal arteries. DA, AA, T spine, C spine, face, nose/lips, and brain anatomy were not obtained due to fetal position.   Survey of the adnexa demonstrates no adnexal masses. There is no free peritoneal fluid in the cul de sac.  Impression: 1. 18 3/7 week Viable Singleton Intrauterine pregnancy by U/S. 2. (U/S) EDD is consistent with Clinically established (LMP) EDD of 02/21/2015. 3. Incomplete Anatomy Scan  Recommendations: 1.Clinical correlation with the patient's History and Physical Exam. 2. Follow up ultrasound in 3 weeks  Lewis,Amber, [employer] reviewed and agree with findings.  Counseled patient at today's visit  Yolanda Bonine, CNM

## 2014-09-30 NOTE — Progress Notes (Signed)
[name]-denies any new complaints, Zofran is helping some with nausea

## 2014-10-28 ENCOUNTER — Ambulatory Visit: Payer: 59

## 2014-10-28 ENCOUNTER — Encounter: Payer: Self-pay | Admitting: Obstetrics and Gynecology

## 2014-10-28 ENCOUNTER — Ambulatory Visit (INDEPENDENT_AMBULATORY_CARE_PROVIDER_SITE_OTHER): Payer: 59 | Admitting: Obstetrics and Gynecology

## 2014-10-28 VITALS — BP 119/72 | HR 79 | Wt 179.2 lb

## 2014-10-28 DIAGNOSIS — Z3492 Encounter for supervision of normal pregnancy, unspecified, second trimester: Secondary | ICD-10-CM

## 2014-10-28 DIAGNOSIS — O283 Abnormal ultrasonic finding on antenatal screening of mother: Secondary | ICD-10-CM

## 2014-10-28 DIAGNOSIS — O289 Unspecified abnormal findings on antenatal screening of mother: Secondary | ICD-10-CM

## 2014-10-28 LAB — POCT URINALYSIS DIPSTICK
BILIRUBIN UA: NEGATIVE
Glucose, UA: NEGATIVE
Ketones, UA: NEGATIVE
LEUKOCYTES UA: NEGATIVE
NITRITE UA: NEGATIVE
PH UA: 6
PROTEIN UA: NEGATIVE
RBC UA: NEGATIVE
Spec Grav, UA: 1.02
Urobilinogen, UA: 0.2

## 2014-10-28 NOTE — Progress Notes (Signed)
Indications:F/U Anatomy  Findings:  Mason JimSingleton intrauterine pregnancy is visualized with FHR at 152 BPM. Biometrics give an (U/S) Gestational age of [redacted] weeks 3 days and an (U/S) EDD of 02/28/15; this correlates with the clinically established EDD of 02/21/15.  Fetal presentation is Breech.  EFW: 5.2g (1lb 2 oz). Placenta: posterior. MVP: 3.9cm.  Anatomic survey is complete; Gender - female . The left kidney was not visualized.  Both renal arteries were visualized using color flow doppler.     There is no free peritoneal fluid in the cul de sac.  Impression: 1. 22 week 3 day Viable Singleton Intrauterine pregnancy by U/S. 2. (U/S) EDD is consistent with Clinically established (LMP) EDD of 02/21/15. 3. The left kidney was not visualized.  Both renal arteries were visualized using color flow doppler.  Recommendations: 1.Clinical correlation with the patient's History and Physical Exam.   Boyce MediciMaria E Hill  Scan reviewed and agree with findings Reviewed with patient- [facility]  Tacia Hindley Ines BloomerBurr, CNM

## 2014-10-28 NOTE — Progress Notes (Signed)
[name]- pt is feeling some better from the nausea, some hip pain, otherwise doing well

## 2014-11-04 ENCOUNTER — Other Ambulatory Visit: Payer: Self-pay | Admitting: Obstetrics and Gynecology

## 2014-11-04 DIAGNOSIS — O283 Abnormal ultrasonic finding on antenatal screening of mother: Secondary | ICD-10-CM

## 2014-11-13 ENCOUNTER — Ambulatory Visit (HOSPITAL_BASED_OUTPATIENT_CLINIC_OR_DEPARTMENT_OTHER)
Admission: RE | Admit: 2014-11-13 | Discharge: 2014-11-13 | Disposition: A | Discharge status: Home / Self Care | Payer: 59 | Source: Ambulatory Visit | Attending: Obstetrics and Gynecology | Admitting: Obstetrics and Gynecology

## 2014-11-13 ENCOUNTER — Ambulatory Visit
Admission: RE | Admit: 2014-11-13 | Discharge: 2014-11-13 | Disposition: A | Discharge status: Home / Self Care | Payer: 59 | Source: Ambulatory Visit | Attending: Obstetrics and Gynecology | Admitting: Obstetrics and Gynecology

## 2014-11-13 DIAGNOSIS — Z79899 Other long term (current) drug therapy: Secondary | ICD-10-CM | POA: Diagnosis not present

## 2014-11-13 DIAGNOSIS — O283 Abnormal ultrasonic finding on antenatal screening of mother: Secondary | ICD-10-CM

## 2014-11-13 DIAGNOSIS — O358XX0 Maternal care for other (suspected) fetal abnormality and damage, not applicable or unspecified: Secondary | ICD-10-CM | POA: Insufficient documentation

## 2014-11-13 DIAGNOSIS — Z3A25 25 weeks gestation of pregnancy: Secondary | ICD-10-CM | POA: Insufficient documentation

## 2014-11-13 DIAGNOSIS — O35EXX0 Maternal care for other (suspected) fetal abnormality and damage, fetal genitourinary anomalies, not applicable or unspecified: Secondary | ICD-10-CM | POA: Insufficient documentation

## 2014-11-13 NOTE — Progress Notes (Signed)
[facility] Consultation   Chief Complaint: absent fetal kidney   HPI: Ms. Joella PrinceMichaela Vieau is a 19 y.o. G1P0 married female at 3237w5d by LMP 05/17/14 with EDC of 02/21/2015  who presents in consultation from  [facility] In[facility] BloomerBurr CNM for absent fetal kidney noted on u/s   Past Medical History: Patient  has a past medical history of Scoliosis and Migraine.  Past Surgical History: She  has no past surgical history on file.  Obstetric History:  OB History    Gravida Para Term Preterm AB TAB SAB Ectopic Multiple Living   1              Gynecologic History:  Patient's last menstrual period was 05/17/2014 (exact date).    Medications   Current Outpatient Prescriptions on File Prior to Encounter  Medication Sig Dispense Refill  . metoCLOPramide (REGLAN) 10 MG tablet Take 1 tablet (10 mg total) by mouth every 6 (six) hours as needed for nausea or vomiting. (Patient not taking: Reported on 10/28/2014) 5 tablet 0  . ondansetron (ZOFRAN ODT) 4 MG disintegrating tablet Take 1 tablet (4 mg total) by mouth every 6 (six) hours as needed for nausea. (Patient not taking: Reported on 11/13/2014) 30 tablet 3  . Prenatal Vit-Fe Fumarate-FA (MULTIVITAMIN-PRENATAL) 27-0.8 MG TABS tablet Take 1 tablet by mouth daily at 12 noon.     No current facility-administered medications on file prior to encounter.   Allergies: Patient is allergic to aloe; lubricants; and tape.  Social History: Patient  reports that she has never smoked. She has never used smokeless tobacco. She reports that she does not drink alcohol or use illicit drugs.  Family History: family history includes Hypertension in her father and mother. Mother has "thin membranes in her kidneys " that cause hematuria  Review of Systems A full 12 point review of systems was negative or as noted in the History of Present Illness.  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 Also higher rate of renal problems  in parents and siblings - none reported in their families. Left renal artery noted with color doppler - cannot rule out pelvic kidney  Mildly elevated BP - pt attributes to anxiety no h/o HTN Plan: I offered a fetal echo- which was declined  I recommended a third trimester scan which was scheduled.  Notify Peds so neonatal evaluation can be done.  Keep CNM appt next week for BP check   Total time spent with the patient was 30 minutes with greater than 50% spent in counseling and coordination of care. We appreciate this interesting consult and will be happy to be involved in the ongoing care of Ms. Willeen CassBennett in anyway her obstetricians desire.  Jimmey RalphElizabeth Alaija Ruble, MD [facility]aternal-Fetal Medicine [facility]

## 2014-11-13 NOTE — Addendum Note (Signed)
Encounter addended by: Jimmey RalphElizabeth Yudith Norlander, MD on: 11/13/2014 10:06 AM<BR>     Documentation filed: [employer], Orders

## 2014-11-28 ENCOUNTER — Observation Stay
Admission: EM | Admit: 2014-11-28 | Discharge: 2014-11-28 | Disposition: A | Discharge status: Home / Self Care | Payer: 59 | Attending: Obstetrics and Gynecology | Admitting: Obstetrics and Gynecology

## 2014-11-28 ENCOUNTER — Encounter: Payer: Self-pay | Admitting: *Deleted

## 2014-11-28 DIAGNOSIS — R030 Elevated blood-pressure reading, without diagnosis of hypertension: Secondary | ICD-10-CM | POA: Insufficient documentation

## 2014-11-28 DIAGNOSIS — O26892 Other specified pregnancy related conditions, second trimester: Principal | ICD-10-CM | POA: Insufficient documentation

## 2014-11-28 DIAGNOSIS — O35EXX0 Maternal care for other (suspected) fetal abnormality and damage, fetal genitourinary anomalies, not applicable or unspecified: Secondary | ICD-10-CM

## 2014-11-28 DIAGNOSIS — R42 Dizziness and giddiness: Secondary | ICD-10-CM | POA: Diagnosis present

## 2014-11-28 DIAGNOSIS — O358XX0 Maternal care for other (suspected) fetal abnormality and damage, not applicable or unspecified: Secondary | ICD-10-CM

## 2014-11-28 DIAGNOSIS — Z3A27 27 weeks gestation of pregnancy: Secondary | ICD-10-CM | POA: Insufficient documentation

## 2014-11-28 NOTE — Progress Notes (Signed)
Dr Valentino Saxoncherry notified of pt's presence. She will put in orders from home. Will monitor at least 1 hour and reassess.

## 2014-11-28 NOTE — Discharge Instructions (Signed)
Call provider or return to birthplace with: ? ?1. Regular contractions ?2. Leaking of fluid from your vagina ?3. Vaginal bleeding: Bright red or heavy like a period ?4. Decreased Fetal movement  ?

## 2014-11-28 NOTE — Plan of Care (Signed)
Pt presents to l/d with c/o feeling flushed and thinking her bp is up

## 2014-11-28 NOTE — Final Progress Note (Signed)
L&D OB Triage Note  HPI:  Sydney James is a 19 y.o. G1P0 female at 3186w6d. Estimated Date of Delivery: 02/21/15 who presented for "not feeling right".  Patient reports that she was at work and began feeling "strange and lightheaded".  Reports checking BPs at work which were normal.  Denies contractions, LOF, vaginal bleeding.  Notes active fetal movement.     ROS:  Review of Systems - Negative except as noted in HPI   Physical Exam:  Blood pressure 140/76, pulse 85, last menstrual period 05/17/2014.   Pulse Rate:  [85-113] 85 (11/25 1854) BP: (135-161)/(76-97) 140/76 mmHg (11/25 1854)  (161/97 BP noted with wrong cuff)  General appearance: alert and no distress Abdomen: soft, non-tender; bowel sounds normal; no masses,  no organomegaly.  Gravid.  Pelvic: pelvic exam deferred  Extremities: extremities normal, atraumatic, no cyanosis or edema   FETAL SURVEILLANCE TESTING SUMMARY  INDICATIONS: patient reassurance    OBJECTIVE RESULTS:  Mode: External Baseline Rate (A): 140 bpm Variability: Moderate Accelerations: 15 x 15 Decelerations: Variable (mild occasional)     Contraction Frequency (min): occ/irreg  Fetal surveillance: reassuring'   Labs:  Results for orders placed or performed in visit on 10/28/14  POCT urinalysis dipstick  Result Value Ref Range   Color, UA pale yellow    Clarity, UA clear    Glucose, UA neg    Bilirubin, UA neg    Ketones, UA neg    Spec Grav, UA 1.020    Blood, UA neg    pH, UA 6.0    Protein, UA neg    Urobilinogen, UA 0.2    Nitrite, UA neg    Leukocytes, UA Negative Negative    Assessment:  19 y.o. G1P0 at 3286w6d with:  1. Borderline elevated BPs, negative urine protein, likely developing GHTN   Plan:  1. Disscussed GHTN, warning signs of pre-eclampsia, and to return immediately for evaluation if symptomatic.  2. Patient's symptoms mostly subsided while in triage.  Advised to go home and rest today and tomorrow, avoid  stressors.  To keep next scheduled appt on next week.    Hildred LaserAnika Emmarose Klinke, MD [facility]

## 2014-12-02 ENCOUNTER — Ambulatory Visit (INDEPENDENT_AMBULATORY_CARE_PROVIDER_SITE_OTHER): Payer: 59 | Admitting: Obstetrics and Gynecology

## 2014-12-02 ENCOUNTER — Other Ambulatory Visit: Payer: Self-pay | Admitting: Obstetrics and Gynecology

## 2014-12-02 ENCOUNTER — Other Ambulatory Visit: Payer: 59

## 2014-12-02 ENCOUNTER — Encounter: Payer: Self-pay | Admitting: Obstetrics and Gynecology

## 2014-12-02 VITALS — BP 128/79 | HR 94 | Wt 190.4 lb

## 2014-12-02 DIAGNOSIS — Z3493 Encounter for supervision of normal pregnancy, unspecified, third trimester: Secondary | ICD-10-CM | POA: Diagnosis not present

## 2014-12-02 DIAGNOSIS — Z23 Encounter for immunization: Secondary | ICD-10-CM | POA: Diagnosis not present

## 2014-12-02 LAB — POCT URINALYSIS DIPSTICK
Bilirubin, UA: NEGATIVE
Ketones, UA: NEGATIVE
LEUKOCYTES UA: NEGATIVE
NITRITE UA: NEGATIVE
Protein, UA: NEGATIVE
RBC UA: NEGATIVE
Spec Grav, UA: 1.01
UROBILINOGEN UA: 0.2
pH, UA: 6

## 2014-12-02 MED ORDER — TETANUS-DIPHTH-ACELL PERTUSSIS 5-2.5-18.5 LF-MCG/0.5 IM SUSP
0.5000 mL | Freq: Once | INTRAMUSCULAR | Status: AC
  Administered 2014-12-02: 0.5 mL via INTRAMUSCULAR

## 2014-12-02 NOTE — Progress Notes (Signed)
[name]- blood consent signed, info on cord blood donation, CBC, ICC & BFC given. [facility]. URI viral x 1 week, to try tylenol cold & sinus. To continue to watch BP and as long as diastolic <90 OK.

## 2014-12-02 NOTE — Progress Notes (Signed)
[name]-pt is having some BP issues, went to ER 11/28/14, blood consent signed, glucola done, tdap given

## 2014-12-02 NOTE — Patient Instructions (Signed)

## 2014-12-03 ENCOUNTER — Other Ambulatory Visit: Payer: Self-pay | Admitting: Obstetrics and Gynecology

## 2014-12-03 DIAGNOSIS — R7309 Other abnormal glucose: Secondary | ICD-10-CM | POA: Insufficient documentation

## 2014-12-03 DIAGNOSIS — D649 Anemia, unspecified: Secondary | ICD-10-CM

## 2014-12-03 LAB — HEMOGLOBIN AND HEMATOCRIT, BLOOD
HEMATOCRIT: 32 % — AB (ref 34.0–46.6)
HEMOGLOBIN: 10.4 g/dL — AB (ref 11.1–15.9)

## 2014-12-03 LAB — GLUCOSE, 1 HOUR GESTATIONAL: Gestational Diabetes Screen: 160 mg/dL — ABNORMAL HIGH (ref 65–139)

## 2014-12-03 MED ORDER — FUSION PLUS PO CAPS: 1.0000 | ORAL_CAPSULE | Freq: Every day | ORAL | Status: DC

## 2014-12-05 ENCOUNTER — Other Ambulatory Visit: Payer: 59

## 2014-12-09 ENCOUNTER — Other Ambulatory Visit: Payer: 59

## 2014-12-09 ENCOUNTER — Other Ambulatory Visit: Payer: Self-pay | Admitting: Obstetrics and Gynecology

## 2014-12-09 DIAGNOSIS — R7309 Other abnormal glucose: Secondary | ICD-10-CM

## 2014-12-10 LAB — GESTATIONAL GLUCOSE TOLERANCE
GLUCOSE 1 HOUR GTT: 184 mg/dL — AB (ref 65–179)
GLUCOSE 2 HOUR GTT: 133 mg/dL (ref 65–154)
GLUCOSE 3 HOUR GTT: 96 mg/dL (ref 65–139)
GLUCOSE FASTING: 90 mg/dL (ref 65–94)

## 2014-12-15 ENCOUNTER — Ambulatory Visit (INDEPENDENT_AMBULATORY_CARE_PROVIDER_SITE_OTHER): Payer: 59 | Admitting: Certified Nurse Midwife

## 2014-12-15 VITALS — BP 123/86 | HR 89 | Wt 193.4 lb

## 2014-12-15 DIAGNOSIS — Z3490 Encounter for supervision of normal pregnancy, unspecified, unspecified trimester: Secondary | ICD-10-CM

## 2014-12-15 DIAGNOSIS — Z331 Pregnant state, incidental: Secondary | ICD-10-CM

## 2014-12-15 DIAGNOSIS — O99011 Anemia complicating pregnancy, first trimester: Secondary | ICD-10-CM | POA: Insufficient documentation

## 2014-12-15 LAB — POCT URINALYSIS DIPSTICK
BILIRUBIN UA: NEGATIVE
Blood, UA: NEGATIVE
GLUCOSE UA: NEGATIVE
Ketones, UA: NEGATIVE
NITRITE UA: NEGATIVE
Protein, UA: NEGATIVE
Spec Grav, UA: 1.01
UROBILINOGEN UA: NEGATIVE
pH, UA: 6

## 2014-12-15 NOTE — Progress Notes (Signed)
Pt c/o not sleeping well and braxton hicks contractions frequently and they sometimes stop her in her tracks.

## 2014-12-15 NOTE — Progress Notes (Signed)
Return OB.  Discussed preterm labor and fetal kick counts.  Patient having a boy "Sydney James"  [facility] and wants son circumcised.  She is planning cord blood donation.  She is scheduled for child birth class and desires to go un medicated.  Advised benadryl for sleep issues.  Patient plans to breast feed. Problem list reviewed and updated.

## 2014-12-25 ENCOUNTER — Ambulatory Visit
Admission: RE | Admit: 2014-12-25 | Discharge: 2014-12-25 | Disposition: A | Discharge status: Home / Self Care | Payer: 59 | Source: Ambulatory Visit | Attending: Obstetrics & Gynecology | Admitting: Obstetrics & Gynecology

## 2014-12-25 DIAGNOSIS — O358XX0 Maternal care for other (suspected) fetal abnormality and damage, not applicable or unspecified: Secondary | ICD-10-CM

## 2014-12-25 DIAGNOSIS — O35EXX0 Maternal care for other (suspected) fetal abnormality and damage, fetal genitourinary anomalies, not applicable or unspecified: Secondary | ICD-10-CM

## 2014-12-30 ENCOUNTER — Ambulatory Visit (INDEPENDENT_AMBULATORY_CARE_PROVIDER_SITE_OTHER): Payer: 59 | Admitting: Certified Nurse Midwife

## 2014-12-30 ENCOUNTER — Encounter: Payer: Self-pay | Admitting: Certified Nurse Midwife

## 2014-12-30 VITALS — BP 121/87 | HR 101 | Wt 196.1 lb

## 2014-12-30 DIAGNOSIS — Z369 Encounter for antenatal screening, unspecified: Secondary | ICD-10-CM

## 2014-12-30 DIAGNOSIS — R7302 Impaired glucose tolerance (oral): Secondary | ICD-10-CM

## 2014-12-30 DIAGNOSIS — Z1389 Encounter for screening for other disorder: Secondary | ICD-10-CM

## 2014-12-30 DIAGNOSIS — R7309 Other abnormal glucose: Secondary | ICD-10-CM

## 2014-12-30 DIAGNOSIS — Z36 Encounter for antenatal screening of mother: Secondary | ICD-10-CM

## 2014-12-30 LAB — POCT URINALYSIS DIPSTICK
BILIRUBIN UA: NEGATIVE
Blood, UA: NEGATIVE
GLUCOSE UA: NEGATIVE
KETONES UA: NEGATIVE
NITRITE UA: NEGATIVE
Protein, UA: NEGATIVE
Spec Grav, UA: 1.015
Urobilinogen, UA: NEGATIVE
pH, UA: 7

## 2014-12-30 NOTE — Progress Notes (Signed)
[name]-c/o pubic pain on occasion.  Discussed comfort measures:tylenol, tub baths and maternity belt.

## 2014-12-30 NOTE — Patient Instructions (Signed)

## 2015-01-04 NOTE — L&D Delivery Note (Signed)
Delivery Summary for Sydney James  Labor Events:   Preterm labor:   Rupture date:   Rupture time:   Rupture type: Intact  Fluid Color: Clear  Induction:   Augmentation:   Complications:   Cervical ripening:          Delivery:   Episiotomy:   Lacerations:   Repair suture:   Repair # of packets:   Blood loss (ml): 200   Information for the patient's newborn:  [name], [name] [[id number]]    Delivery 01/25/2015 3:15 PM by  Vaginal, Spontaneous Delivery Sex:  female Gestational Age: [redacted]w[redacted]d Delivery Clinician:  Hildred Laser Living?:         APGARS  One minute Five minutes Ten minutes  Skin color: 0   1      Heart rate: 2   2      Grimace: 2   2      Muscle tone: 2   2      Breathing: 2   2      Totals: 8  9      Presentation/position: Vertex     Resuscitation: None  Cord information:    Disposition of cord blood: Yes    Blood gases sent? No Complications: None  Placenta: Delivered: 01/25/2015 3:35 PM  Spontaneous  Intact appearance Newborn Measurements: Weight: 6 lb 4.2 oz (2840 g)  Height: 19.69"  Head circumference: 29 cm  Chest circumference: 29 cm  Other providers: Delivery Nurse Registered Nurse Marjie Skiff Clifton James  Additional  information: Forceps:   Vacuum:   Breech:   Observed anomalies          Delivery Note At 3:15 PM a viable and healthy female was delivered via Vaginal, Spontaneous Delivery (Presentation:vertex ; Right Occiput Anterior).  APGAR: 8, 9; weight 6 lb 4.2 oz (2840 g).   Placenta status: Intact, Spontaneous.  Cord:  with the following complications: None.  Cord pH: not obtained.   Anesthesia: Epidural  Episiotomy:  None Lacerations:  1st degree labial Suture Repair: 3.0 vicryl Est. Blood Loss (mL):  200  Mom to postpartum.  Baby to Couplet care / Skin to Skin.  Hildred Laser 01/25/2015, 9:44 PM

## 2015-01-13 ENCOUNTER — Encounter: Payer: 59 | Admitting: Obstetrics and Gynecology

## 2015-01-15 ENCOUNTER — Encounter: Payer: Self-pay | Admitting: Obstetrics and Gynecology

## 2015-01-15 ENCOUNTER — Encounter: Payer: 59 | Admitting: Obstetrics and Gynecology

## 2015-01-15 ENCOUNTER — Ambulatory Visit (INDEPENDENT_AMBULATORY_CARE_PROVIDER_SITE_OTHER): Payer: 59 | Admitting: Obstetrics and Gynecology

## 2015-01-15 VITALS — BP 134/79 | HR 93 | Wt 205.5 lb

## 2015-01-15 DIAGNOSIS — Z331 Pregnant state, incidental: Secondary | ICD-10-CM

## 2015-01-15 LAB — POCT URINALYSIS DIPSTICK
Bilirubin, UA: NEGATIVE
Blood, UA: NEGATIVE
Glucose, UA: NEGATIVE
Ketones, UA: NEGATIVE
LEUKOCYTES UA: NEGATIVE
Nitrite, UA: NEGATIVE
PH UA: 6.5
PROTEIN UA: NEGATIVE
SPEC GRAV UA: 1.01
UROBILINOGEN UA: 0.2

## 2015-01-15 NOTE — Progress Notes (Signed)
[name]- pt is having increased pelvic pressure, having braxton hicks qd multiple times

## 2015-01-15 NOTE — Progress Notes (Signed)
[name]- discussed normal end of pregnancy symptoms, culture next visit, has f/u appt with [facility] on the 19th.

## 2015-01-15 NOTE — Patient Instructions (Signed)
Braxton Hicks Contractions °Contractions of the uterus can occur throughout pregnancy. Contractions are not always a sign that you are in labor.  °WHAT ARE BRAXTON HICKS CONTRACTIONS?  °Contractions that occur before labor are called Braxton Hicks contractions, or false labor. Toward the end of pregnancy (32-34 weeks), these contractions can develop more often and may become more forceful. This is not true labor because these contractions do not result in opening (dilatation) and thinning of the cervix. They are sometimes difficult to tell apart from true labor because these contractions can be forceful and people have different pain tolerances. You should not feel embarrassed if you go to the hospital with false labor. Sometimes, the only way to tell if you are in true labor is for your health care provider to look for changes in the cervix. °If there are no prenatal problems or other health problems associated with the pregnancy, it is completely safe to be sent home with false labor and await the onset of true labor. °HOW CAN YOU TELL THE DIFFERENCE BETWEEN TRUE AND FALSE LABOR? °False Labor °· The contractions of false labor are usually shorter and not as hard as those of true labor.   °· The contractions are usually irregular.   °· The contractions are often felt in the front of the lower abdomen and in the groin.   °· The contractions may go away when you walk around or change positions while lying down.   °· The contractions get weaker and are shorter lasting as time goes on.   °· The contractions do not usually become progressively stronger, regular, and closer together as with true labor.   °True Labor °· Contractions in true labor last 30-70 seconds, become very regular, usually become more intense, and increase in frequency.   °· The contractions do not go away with walking.   °· The discomfort is usually felt in the top of the uterus and spreads to the lower abdomen and low back.   °· True labor can be  determined by your health care provider with an exam. This will show that the cervix is dilating and getting thinner.   °WHAT TO REMEMBER °· Keep up with your usual exercises and follow other instructions given by your health care provider.   °· Take medicines as directed by your health care provider.   °· Keep your regular prenatal appointments.   °· Eat and drink lightly if you think you are going into labor.   °· If Braxton Hicks contractions are making you uncomfortable:   °¨ Change your position from lying down or resting to walking, or from walking to resting.   °¨ Sit and rest in a tub of warm water.   °¨ Drink 2-3 glasses of water. Dehydration may cause these contractions.   °¨ Do slow and deep breathing several times an hour.   °WHEN SHOULD I SEEK IMMEDIATE MEDICAL CARE? °Seek immediate medical care if: °· Your contractions become stronger, more regular, and closer together.   °· You have fluid leaking or gushing from your vagina.   °· You have a fever.   °· You pass blood-tinged mucus.   °· You have vaginal bleeding.   °· You have continuous abdominal pain.   °· You have low back pain that you never had before.   °· You feel your baby's head pushing down and causing pelvic pressure.   °· Your baby is not moving as much as it used to.   °  °This information is not intended to replace advice given to you by your health care provider. Make sure you discuss any questions you have with your health care   provider. °  °Document Released: 12/20/2004 Document Revised: 12/25/2012 Document Reviewed: 10/01/2012 °Elsevier Interactive Patient Education ©2016 [employer]. ° °

## 2015-01-22 ENCOUNTER — Ambulatory Visit
Admission: RE | Admit: 2015-01-22 | Discharge: 2015-01-22 | Disposition: A | Discharge status: Home / Self Care | Payer: 59 | Source: Ambulatory Visit | Attending: Maternal & Fetal Medicine | Admitting: Maternal & Fetal Medicine

## 2015-01-22 DIAGNOSIS — Q602 Renal agenesis, unspecified: Secondary | ICD-10-CM

## 2015-01-22 DIAGNOSIS — O358XX0 Maternal care for other (suspected) fetal abnormality and damage, not applicable or unspecified: Secondary | ICD-10-CM

## 2015-01-22 DIAGNOSIS — Z3A35 35 weeks gestation of pregnancy: Secondary | ICD-10-CM

## 2015-01-23 ENCOUNTER — Encounter: Payer: Self-pay | Admitting: Obstetrics and Gynecology

## 2015-01-23 ENCOUNTER — Ambulatory Visit (INDEPENDENT_AMBULATORY_CARE_PROVIDER_SITE_OTHER): Payer: 59 | Admitting: Obstetrics and Gynecology

## 2015-01-23 ENCOUNTER — Inpatient Hospital Stay
Admission: EM | Admit: 2015-01-23 | Discharge: 2015-01-24 | Disposition: A | Discharge status: Home / Self Care | Payer: 59 | Source: Home / Self Care | Admitting: Obstetrics and Gynecology

## 2015-01-23 VITALS — BP 147/83 | HR 92 | Wt 215.9 lb

## 2015-01-23 DIAGNOSIS — Z3A36 36 weeks gestation of pregnancy: Secondary | ICD-10-CM

## 2015-01-23 DIAGNOSIS — O133 Gestational [pregnancy-induced] hypertension without significant proteinuria, third trimester: Secondary | ICD-10-CM

## 2015-01-23 DIAGNOSIS — O99011 Anemia complicating pregnancy, first trimester: Secondary | ICD-10-CM

## 2015-01-23 DIAGNOSIS — O358XX0 Maternal care for other (suspected) fetal abnormality and damage, not applicable or unspecified: Secondary | ICD-10-CM

## 2015-01-23 DIAGNOSIS — O1403 Mild to moderate pre-eclampsia, third trimester: Secondary | ICD-10-CM | POA: Insufficient documentation

## 2015-01-23 DIAGNOSIS — O35EXX0 Maternal care for other (suspected) fetal abnormality and damage, fetal genitourinary anomalies, not applicable or unspecified: Secondary | ICD-10-CM

## 2015-01-23 DIAGNOSIS — Z331 Pregnant state, incidental: Secondary | ICD-10-CM

## 2015-01-23 DIAGNOSIS — O163 Unspecified maternal hypertension, third trimester: Secondary | ICD-10-CM

## 2015-01-23 DIAGNOSIS — O139 Gestational [pregnancy-induced] hypertension without significant proteinuria, unspecified trimester: Secondary | ICD-10-CM

## 2015-01-23 LAB — POCT URINALYSIS DIPSTICK
BILIRUBIN UA: NEGATIVE
Blood, UA: NEGATIVE
GLUCOSE UA: NEGATIVE
KETONES UA: NEGATIVE
LEUKOCYTES UA: NEGATIVE
NITRITE UA: NEGATIVE
PH UA: 7
Spec Grav, UA: 1.01
Urobilinogen, UA: 0.2

## 2015-01-23 MED ORDER — BETAMETHASONE SOD PHOS & ACET 6 (3-3) MG/ML IJ SUSP
12.0000 mg | Freq: Once | INTRAMUSCULAR | Status: AC
  Administered 2015-01-24: 12 mg via INTRAMUSCULAR

## 2015-01-23 MED ORDER — ZOLPIDEM TARTRATE 5 MG PO TABS
5.0000 mg | ORAL_TABLET | Freq: Every evening | ORAL | Status: DC | PRN
  Administered 2015-01-23: 5 mg via ORAL
  Filled 2015-01-23: qty 1

## 2015-01-23 MED ORDER — BETAMETHASONE SOD PHOS & ACET 6 (3-3) MG/ML IJ SUSP
12.0000 mg | Freq: Once | INTRAMUSCULAR | Status: AC
  Administered 2015-01-23: 12 mg via INTRAMUSCULAR
  Filled 2015-01-23: qty 2

## 2015-01-23 NOTE — Progress Notes (Signed)
OB WORK IN- increased swelling, feet, hands, she feels like her face is puffy

## 2015-01-23 NOTE — Patient Instructions (Signed)

## 2015-01-23 NOTE — OB Triage Provider Note (Signed)
L&D OB Triage Note  Yvett Rossel is a 20 y.o. G1P0 female at [redacted]w[redacted]d, EDD Estimated Date of Delivery: 02/21/15 who presented to triage for complaints of elevated blood pressure at home, after being seen in office today, sent home on modified bedrest.  She was evaluated by the nurses with findings significant for PIH. Vital signs elevated but improve with left lateral positioning. An NST was performed and has been reviewed by myself. She was treated with betamethasone, 24 hour urine started. Reviewed all info. with Dr Valentino Saxon  NST INTERPRETATION: Indications: pregnancy-induced hypertension  Mode: External Baseline Rate (A): 140 bpm Variability: Moderate Accelerations: 15 x 15 Decelerations: None     Contraction Frequency (min): none  Impression: reactive   Plan: NST performed was reviewed and was found to be reactive.last BP 141/87. Will keep observation for 24 hours to collect 24 hr urine for protein and give steroid dose, monitor BP, will d/c tomorrow if stable.   Hafsa Lohn Suzan Nailer, CNM

## 2015-01-23 NOTE — Progress Notes (Signed)
Workin OB- reports sudden onset of swelling this am, denies decreased FM or HA or visual changes. PIH precautions discussed and sent home on modified bedrest, with start bi-weekly NST

## 2015-01-23 NOTE — OB Triage Note (Signed)
Pt here after taking BP at home and it was elevated. Pt seen in MD office earlier today. Labs done there.

## 2015-01-24 ENCOUNTER — Inpatient Hospital Stay
Admission: EM | Admit: 2015-01-24 | Discharge: 2015-01-27 | DRG: 775 | Disposition: A | Discharge status: Home / Self Care | Payer: 59 | Attending: Obstetrics and Gynecology | Admitting: Obstetrics and Gynecology

## 2015-01-24 DIAGNOSIS — Z3A36 36 weeks gestation of pregnancy: Secondary | ICD-10-CM

## 2015-01-24 DIAGNOSIS — O9902 Anemia complicating childbirth: Secondary | ICD-10-CM | POA: Diagnosis present

## 2015-01-24 DIAGNOSIS — R030 Elevated blood-pressure reading, without diagnosis of hypertension: Secondary | ICD-10-CM | POA: Diagnosis not present

## 2015-01-24 DIAGNOSIS — O164 Unspecified maternal hypertension, complicating childbirth: Secondary | ICD-10-CM | POA: Diagnosis present

## 2015-01-24 DIAGNOSIS — Z8249 Family history of ischemic heart disease and other diseases of the circulatory system: Secondary | ICD-10-CM

## 2015-01-24 DIAGNOSIS — Z3480 Encounter for supervision of other normal pregnancy, unspecified trimester: Secondary | ICD-10-CM | POA: Diagnosis not present

## 2015-01-24 DIAGNOSIS — Q602 Renal agenesis, unspecified: Secondary | ICD-10-CM

## 2015-01-24 DIAGNOSIS — O1404 Mild to moderate pre-eclampsia, complicating childbirth: Principal | ICD-10-CM | POA: Diagnosis present

## 2015-01-24 DIAGNOSIS — R51 Headache: Secondary | ICD-10-CM | POA: Diagnosis present

## 2015-01-24 DIAGNOSIS — D649 Anemia, unspecified: Secondary | ICD-10-CM | POA: Diagnosis present

## 2015-01-24 LAB — PROTEIN / CREATININE RATIO, URINE
Creatinine, Urine: 84.4 mg/dL
PROTEIN UR: 43.7 mg/dL
PROTEIN/CREAT RATIO: 518 mg/g{creat} — AB (ref 0–200)

## 2015-01-24 LAB — COMPREHENSIVE METABOLIC PANEL
A/G RATIO: 1.2 (ref 1.1–2.5)
ALBUMIN: 3.1 g/dL — AB (ref 3.5–5.5)
ALT: 16 IU/L (ref 0–32)
AST: 14 IU/L (ref 0–40)
Alkaline Phosphatase: 119 IU/L — ABNORMAL HIGH (ref 39–117)
BUN / CREAT RATIO: 19 (ref 8–20)
BUN: 8 mg/dL (ref 6–20)
Bilirubin Total: <0.2 mg/dL (ref 0.0–1.2)
CALCIUM: 8.9 mg/dL (ref 8.7–10.2)
CO2: 20 mmol/L (ref 18–29)
CREATININE: 0.43 mg/dL — AB (ref 0.57–1.00)
Chloride: 103 mmol/L (ref 96–106)
GFR, EST AFRICAN AMERICAN: 168 mL/min/{1.73_m2} (ref >59)
GFR, EST NON AFRICAN AMERICAN: 146 mL/min/{1.73_m2} (ref >59)
GLOBULIN, TOTAL: 2.6 g/dL (ref 1.5–4.5)
Glucose: 86 mg/dL (ref 65–99)
POTASSIUM: 4.5 mmol/L (ref 3.5–5.2)
SODIUM: 139 mmol/L (ref 134–144)
TOTAL PROTEIN: 5.7 g/dL — AB (ref 6.0–8.5)

## 2015-01-24 LAB — CBC
Hematocrit: 31 % — ABNORMAL LOW (ref 34.0–46.6)
Hemoglobin: 10.7 g/dL — ABNORMAL LOW (ref 11.1–15.9)
MCH: 29.5 pg (ref 26.6–33.0)
MCHC: 34.5 g/dL (ref 31.5–35.7)
MCV: 85 fL (ref 79–97)
PLATELETS: 286 10*3/uL (ref 150–379)
RBC: 3.63 x10E6/uL — AB (ref 3.77–5.28)
RDW: 13.9 % (ref 12.3–15.4)
WBC: 12.3 10*3/uL — ABNORMAL HIGH (ref 3.4–10.8)

## 2015-01-24 LAB — URIC ACID: URIC ACID: 5.5 mg/dL (ref 2.5–7.1)

## 2015-01-24 NOTE — Progress Notes (Signed)
Antenatal Progress Note  Subjective:     Patient ID: Sydney James is a 20 y.o. G1P0 female [redacted]w[redacted]d , Estimated Date of Delivery: 02/21/15 who was admitted for observation to rule out severe pre-eclampsia.  HD# 2.   Subjective:  Patient denies complaints today.    Review of Systems Denies contractions, leakage of fluids, vaginal bleeding, and reports good fetal movement. Denies headaches, blurred vision, RUQ pain.     Objective:   Filed Vitals:   01/24/15 0238 01/24/15 0428 01/24/15 0825 01/24/15 1159  BP: 134/93 115/66 128/82 141/89  Pulse: 78 74 75 93  Temp:  98.4 F (36.9 C) 98.4 F (36.9 C) 98.2 F (36.8 C)  TempSrc:  Oral Oral Oral  Resp:  General appearance: alert and no distress Lungs: clear to auscultation bilaterally Heart: regular rate and rhythm, S1, S2 normal, no murmur, click, rub or gallop Abdomen: soft, non-tender; bowel sounds normal; no masses,  no organomegaly and gravid Pelvic: deferred Extremities: extremities normal, atraumatic, no cyanosis or edema   NST performed today was reviewed and was found to be reactive.  FHT: baseline 135 bpm, accels present, decels absent.  Variability: moderate Toco: occasional contractions   Labs:  Results for orders placed or performed in visit on 01/23/15  Comprehensive metabolic panel  Result Value Ref Range   Glucose 86 65 - 99 mg/dL   BUN 8 6 - 20 mg/dL   Creatinine, Ser 1.61 (L) 0.57 - 1.00 mg/dL   GFR calc non Af Amer 146 >59 mL/min/1.73   GFR calc Af Amer 168 >59 mL/min/1.73   BUN/Creatinine Ratio 19 8 - 20   Sodium 139 134 - 144 mmol/L   Potassium 4.5 3.5 - 5.2 mmol/L   Chloride 103 96 - 106 mmol/L   CO2 20 18 - 29 mmol/L   Calcium 8.9 8.7 - 10.2 mg/dL   Total Protein 5.7 (L) 6.0 - 8.5 g/dL   Albumin 3.1 (L) 3.5 - 5.5 g/dL   Globulin, Total 2.6 1.5 - 4.5 g/dL   Albumin/Globulin Ratio 1.2 1.1 - 2.5   Bilirubin Total <0.2 0.0 - 1.2 mg/dL   Alkaline Phosphatase 119 (H) 39 - 117 IU/L   AST  14 0 - 40 IU/L   ALT 16 0 - 32 IU/L  CBC  Result Value Ref Range   WBC 12.3 (H) 3.4 - 10.8 x10E3/uL   RBC 3.63 (L) 3.77 - 5.28 x10E6/uL   Hemoglobin 10.7 (L) 11.1 - 15.9 g/dL   Hematocrit 09.6 (L) 04.5 - 46.6 %   MCV 85 79 - 97 fL   MCH 29.5 26.6 - 33.0 pg   MCHC 34.5 31.5 - 35.7 g/dL   RDW 40.9 81.1 - 91.4 %   Platelets 286 150 - 379 x10E3/uL  Uric acid  Result Value Ref Range   Uric Acid 5.5 2.5 - 7.1 mg/dL  Protein / creatinine ratio, urine  Result Value Ref Range   Creatinine, Urine 84.4 Not Estab. mg/dL   Protein, Ur 78.2 Not Estab. mg/dL   Protein/Creat Ratio 956 (H) 0 - 200 mg/g creat  POCT urinalysis dipstick  Result Value Ref Range   Color, UA yellow    Clarity, UA clear    Glucose, UA neg    Bilirubin, UA neg    Ketones, UA neg    Spec Grav, UA 1.010    Blood, UA neg    pH, UA 7.0    Protein, UA 3+  Urobilinogen, UA 0.2    Nitrite, UA neg    Leukocytes, UA Negative Negative   Results for Sydney, James (MRN [id number]) as of 01/24/2015 13:34  Ref. Range 01/23/2015 10:58  Protein Urine Random Latest Ref Range: Not Estab. mg/dL 82.9  Creatinine, Urine Latest Ref Range: Not Estab. mg/dL 56.2  Protein/Creat Ratio Latest Ref Range: 0-200 mg/g creat 518 (H)  Total Protein Latest Ref Range: 6.0-8.5 g/dL 5.7 (L)    Assessment:  20 y.o. female [redacted]w[redacted]d, Estimated Date of Delivery: 02/21/15 with:  Mild pre-eclampsia   Plan:  Continue to monitor BPs. BPs elevated at home (160s/100s). Have been in normal to mildly elevated range since admission.  No severe range BPs noted. Continue 24 hr urine protein collection.  Advised patient on appropriate monitoring of BPs at home (notes checking every hour).  Can decrease to 2-3 times daily.  Is receiving course of antenatal steroids this admission.  Due for final dose at 5 pm.  If BPs remain in mild range, can d/c home this evening, continue bed rest.  Pre-eclampsia precautions given.  Will need to have f/u twice weekly in  office for lab/BP monitoring and NSTs.  Plan for delivery after 37 weeks unless severe symptoms develop.    Hildred Laser, MD [facility]

## 2015-01-24 NOTE — Discharge Summary (Signed)
    OB Discharge Summary     Patient Name: Sydney James DOB: [birth year] MRN: [id number]  Date of admission: 01/23/2015 Delivering MD: This patient has no babies on file.  Date of discharge: 01/24/2015  Admitting diagnosis: 35.[redacted] weeks gestation,elevated blood pressues Intrauterine pregnancy: [redacted]w[redacted]d     Secondary diagnosis:  Active Problems:   PIH (pregnancy induced hypertension), antepartum  Additional problems: None     Discharge diagnosis: Preeclampsia (mild)                                          Complications: None  Hospital course:  The patient was admitted to observation for history of elevated blood pressures, rule out pre-eclampsia.  She underwent BP monitoring, a 24 hour urine protein collection, and received a course of antenatal steroids.  BPs remained below 160/100s, and patient was discharged after 24 hours of observation.    Physical exam  Filed Vitals:   01/24/15 1159 01/24/15 1537 01/24/15 1810 01/24/15 1849  BP: 141/89 120/75 155/96 145/83  Pulse: 93 83 78 74  Temp: 98.2 F (36.8 C) 97.6 F (36.4 C) 98.4 F (36.9 C)   TempSrc: Oral Oral Axillary   Resp: General: alert and no distress Abdomen: gravid, soft Pelvis: deferred DVT Evaluation: No evidence of DVT seen on physical exam. Negative Homan's sign. No cords or calf tenderness. No significant calf/ankle edema.   NST INTERPRETATION:  Indications: pregnancy-induced hypertension  Mode: External Baseline Rate (A): 135 bpm Variability: Moderate Accelerations: 15 x 15 Decelerations: None     Contraction Frequency (min): occasional   Impression: reactive    Labs: Lab Results  Component Value Date   WBC 12.3* 01/23/2015   HGB 12.7 08/26/2014   HCT 31.0* 01/23/2015   MCV 85 01/23/2015   PLT 286 01/23/2015   CMP Latest Ref Rng 01/23/2015  Glucose 65 - 99 mg/dL 86  BUN 6 - 20 mg/dL 8  Creatinine 2.02 - 5.42 mg/dL 7.06(C)  Sodium 376 - 283 mmol/L 139  Potassium 3.5 -  5.2 mmol/L 4.5  Chloride 96 - 106 mmol/L 103  CO2 18 - 29 mmol/L 20  Calcium 8.7 - 10.2 mg/dL 8.9  Total Protein 6.0 - 8.5 g/dL 1.5(V)  Total Bilirubin 0.0 - 1.2 mg/dL <7.6  Alkaline Phos 39 - 117 IU/L 119(H)  AST 0 - 40 IU/L 14  ALT 0 - 32 IU/L 16    Discharge instruction:  Follow up in office in 3 days for repeat NST, BP evaluation, and labs.   After visit meds:    Medication List    TAKE these medications        FUSION PLUS Caps  Take 1 capsule by mouth daily.     multivitamin-prenatal 27-0.8 MG Tabs tablet  Take 1 tablet by mouth daily at 12 noon.        Diet: routine diet  Activity: Modified bed rest   Outpatient follow up: 3 days   Follow up Appt:Future Appointments Date Time Provider Department Center  01/27/2015 10:30 AM Melody Suzan Nailer, CNM [facility] None  02/03/2015 10:00 AM Melody Suzan Nailer, CNM [facility] None  02/10/2015 8:45 AM Melody Suzan Nailer, CNM [facility] None     01/24/2015 Hildred Laser, MD

## 2015-01-24 NOTE — Discharge Instructions (Signed)

## 2015-01-25 ENCOUNTER — Inpatient Hospital Stay: Payer: 59 | Admitting: Anesthesiology

## 2015-01-25 DIAGNOSIS — O164 Unspecified maternal hypertension, complicating childbirth: Secondary | ICD-10-CM | POA: Diagnosis present

## 2015-01-25 DIAGNOSIS — O1404 Mild to moderate pre-eclampsia, complicating childbirth: Secondary | ICD-10-CM | POA: Diagnosis present

## 2015-01-25 DIAGNOSIS — Z3A36 36 weeks gestation of pregnancy: Secondary | ICD-10-CM | POA: Diagnosis not present

## 2015-01-25 DIAGNOSIS — D649 Anemia, unspecified: Secondary | ICD-10-CM | POA: Diagnosis present

## 2015-01-25 DIAGNOSIS — R51 Headache: Secondary | ICD-10-CM | POA: Diagnosis present

## 2015-01-25 DIAGNOSIS — O9902 Anemia complicating childbirth: Secondary | ICD-10-CM | POA: Diagnosis present

## 2015-01-25 DIAGNOSIS — Q602 Renal agenesis, unspecified: Secondary | ICD-10-CM | POA: Diagnosis not present

## 2015-01-25 DIAGNOSIS — Z8249 Family history of ischemic heart disease and other diseases of the circulatory system: Secondary | ICD-10-CM | POA: Diagnosis not present

## 2015-01-25 LAB — TYPE AND SCREEN
ABO/RH(D): O POS
Antibody Screen: NEGATIVE

## 2015-01-25 LAB — ABO/RH: ABO/RH(D): O POS

## 2015-01-25 LAB — CBC
HEMATOCRIT: 32.3 % — AB (ref 35.0–47.0)
HEMOGLOBIN: 10.7 g/dL — AB (ref 12.0–16.0)
MCH: 28.6 pg (ref 26.0–34.0)
MCHC: 33.2 g/dL (ref 32.0–36.0)
MCV: 86.3 fL (ref 80.0–100.0)
Platelets: 301 10*3/uL (ref 150–440)
RBC: 3.75 MIL/uL — ABNORMAL LOW (ref 3.80–5.20)
RDW: 13.9 % (ref 11.5–14.5)
WBC: 18.6 10*3/uL — AB (ref 3.6–11.0)

## 2015-01-25 LAB — CHLAMYDIA/NGC RT PCR (ARMC ONLY)
CHLAMYDIA TR: NOT DETECTED
N GONORRHOEAE: NOT DETECTED

## 2015-01-25 MED ORDER — LIDOCAINE HCL (PF) 1 % IJ SOLN
INTRAMUSCULAR | Status: DC | PRN
  Administered 2015-01-25: 1 mL via INTRADERMAL

## 2015-01-25 MED ORDER — OXYTOCIN 40 UNITS IN LACTATED RINGERS INFUSION - SIMPLE MED
1.0000 m[IU]/min | INTRAVENOUS | Status: DC
  Administered 2015-01-25: 1 m[IU]/min via INTRAVENOUS

## 2015-01-25 MED ORDER — FENTANYL 2.5 MCG/ML W/ROPIVACAINE 0.2% IN NS 100 ML EPIDURAL INFUSION (ARMC-ANES)
EPIDURAL | Status: AC
  Administered 2015-01-25: 10 mL/h via EPIDURAL
  Filled 2015-01-25: qty 100

## 2015-01-25 MED ORDER — NALBUPHINE HCL 10 MG/ML IJ SOLN: 5.0000 mg | Freq: Once | INTRAMUSCULAR | Status: DC | PRN

## 2015-01-25 MED ORDER — OXYCODONE-ACETAMINOPHEN 5-325 MG PO TABS
2.0000 | ORAL_TABLET | ORAL | Status: DC | PRN
  Filled 2015-01-25: qty 2

## 2015-01-25 MED ORDER — BUTORPHANOL TARTRATE 1 MG/ML IJ SOLN
1.0000 mg | INTRAMUSCULAR | Status: DC | PRN
  Administered 2015-01-25: 1 mg via INTRAVENOUS
  Filled 2015-01-25: qty 1

## 2015-01-25 MED ORDER — ZOLPIDEM TARTRATE 5 MG PO TABS: 5.0000 mg | ORAL_TABLET | Freq: Every evening | ORAL | Status: DC | PRN

## 2015-01-25 MED ORDER — NALBUPHINE HCL 10 MG/ML IJ SOLN: 5.0000 mg | INTRAMUSCULAR | Status: DC | PRN

## 2015-01-25 MED ORDER — OXYCODONE-ACETAMINOPHEN 5-325 MG PO TABS
1.0000 | ORAL_TABLET | ORAL | Status: DC | PRN
Start: 2015-01-25 — End: 2015-01-27
  Administered 2015-01-25: 1 via ORAL

## 2015-01-25 MED ORDER — ONDANSETRON HCL 4 MG PO TABS: 4.0000 mg | ORAL_TABLET | ORAL | Status: DC | PRN

## 2015-01-25 MED ORDER — LIDOCAINE-EPINEPHRINE (PF) 1.5 %-1:200000 IJ SOLN
INTRAMUSCULAR | Status: DC | PRN
  Administered 2015-01-25: 3 mL via EPIDURAL

## 2015-01-25 MED ORDER — PRENATAL MULTIVITAMIN CH
1.0000 | ORAL_TABLET | Freq: Every day | ORAL | Status: DC
  Administered 2015-01-26: 1 via ORAL
  Filled 2015-01-25: qty 1

## 2015-01-25 MED ORDER — ONDANSETRON HCL 4 MG/2ML IJ SOLN
4.0000 mg | Freq: Four times a day (QID) | INTRAMUSCULAR | Status: DC | PRN
  Administered 2015-01-25: 4 mg via INTRAVENOUS
  Filled 2015-01-25: qty 2

## 2015-01-25 MED ORDER — LACTATED RINGERS IV SOLN: 500.0000 mL | INTRAVENOUS | Status: DC | PRN

## 2015-01-25 MED ORDER — NALBUPHINE HCL 10 MG/ML IJ SOLN
5.0000 mg | INTRAMUSCULAR | Status: DC | PRN
Start: 2015-01-25 — End: 2015-01-25

## 2015-01-25 MED ORDER — LACTATED RINGERS IV SOLN
INTRAVENOUS | Status: DC
  Administered 2015-01-25 (×3): via INTRAVENOUS

## 2015-01-25 MED ORDER — DIPHENHYDRAMINE HCL 25 MG PO CAPS: 25.0000 mg | ORAL_CAPSULE | ORAL | Status: DC | PRN

## 2015-01-25 MED ORDER — SODIUM CHLORIDE 0.9 % IJ SOLN: 3.0000 mL | INTRAMUSCULAR | Status: DC | PRN

## 2015-01-25 MED ORDER — SIMETHICONE 80 MG PO CHEW: 80.0000 mg | CHEWABLE_TABLET | ORAL | Status: DC | PRN

## 2015-01-25 MED ORDER — DIBUCAINE 1 % RE OINT: 1.0000 "application " | TOPICAL_OINTMENT | RECTAL | Status: DC | PRN

## 2015-01-25 MED ORDER — BENZOCAINE-MENTHOL 20-0.5 % EX AERO
1.0000 "application " | INHALATION_SPRAY | CUTANEOUS | Status: DC | PRN
  Filled 2015-01-25: qty 56

## 2015-01-25 MED ORDER — SODIUM CHLORIDE 0.9 % IV SOLN
1.0000 g | INTRAVENOUS | Status: DC
Start: 2015-01-25 — End: 2015-01-25
  Administered 2015-01-25 (×3): 1 g via INTRAVENOUS
  Filled 2015-01-25 (×3): qty 1000

## 2015-01-25 MED ORDER — ONDANSETRON HCL 4 MG/2ML IJ SOLN: 4.0000 mg | Freq: Three times a day (TID) | INTRAMUSCULAR | Status: DC | PRN

## 2015-01-25 MED ORDER — FENTANYL 2.5 MCG/ML W/ROPIVACAINE 0.2% IN NS 100 ML EPIDURAL INFUSION (ARMC-ANES): 10.0000 mL/h | EPIDURAL | Status: DC

## 2015-01-25 MED ORDER — ACETAMINOPHEN 325 MG PO TABS: 650.0000 mg | ORAL_TABLET | ORAL | Status: DC | PRN

## 2015-01-25 MED ORDER — NALOXONE HCL 2 MG/2ML IJ SOSY
1.0000 ug/kg/h | PREFILLED_SYRINGE | INTRAVENOUS | Status: DC | PRN
  Filled 2015-01-25: qty 2

## 2015-01-25 MED ORDER — DIPHENHYDRAMINE HCL 50 MG/ML IJ SOLN: 12.5000 mg | INTRAMUSCULAR | Status: DC | PRN

## 2015-01-25 MED ORDER — LANOLIN HYDROUS EX OINT: TOPICAL_OINTMENT | CUTANEOUS | Status: DC | PRN

## 2015-01-25 MED ORDER — DOCUSATE SODIUM 100 MG PO CAPS
100.0000 mg | ORAL_CAPSULE | Freq: Two times a day (BID) | ORAL | Status: DC
  Administered 2015-01-26 – 2015-01-27 (×2): 100 mg via ORAL
  Filled 2015-01-25 (×2): qty 1

## 2015-01-25 MED ORDER — WITCH HAZEL-GLYCERIN EX PADS: 1.0000 "application " | MEDICATED_PAD | CUTANEOUS | Status: DC | PRN

## 2015-01-25 MED ORDER — LIDOCAINE HCL (PF) 1 % IJ SOLN: 30.0000 mL | INTRAMUSCULAR | Status: DC | PRN

## 2015-01-25 MED ORDER — NALOXONE HCL 0.4 MG/ML IJ SOLN: 0.4000 mg | INTRAMUSCULAR | Status: DC | PRN

## 2015-01-25 MED ORDER — BUPIVACAINE HCL (PF) 0.25 % IJ SOLN
INTRAMUSCULAR | Status: DC | PRN
  Administered 2015-01-25: 5 mL via EPIDURAL

## 2015-01-25 MED ORDER — OXYTOCIN BOLUS FROM INFUSION: 500.0000 mL | INTRAVENOUS | Status: DC

## 2015-01-25 MED ORDER — SODIUM CHLORIDE 0.9 % IV SOLN
2.0000 g | Freq: Once | INTRAVENOUS | Status: AC
  Administered 2015-01-25: 2 g via INTRAVENOUS
  Filled 2015-01-25: qty 2000

## 2015-01-25 MED ORDER — OXYTOCIN 40 UNITS IN LACTATED RINGERS INFUSION - SIMPLE MED
2.5000 [IU]/h | INTRAVENOUS | Status: DC
  Filled 2015-01-25: qty 1000

## 2015-01-25 MED ORDER — ONDANSETRON HCL 4 MG/2ML IJ SOLN: 4.0000 mg | INTRAMUSCULAR | Status: DC | PRN

## 2015-01-25 MED ORDER — DIPHENHYDRAMINE HCL 25 MG PO CAPS: 25.0000 mg | ORAL_CAPSULE | Freq: Four times a day (QID) | ORAL | Status: DC | PRN

## 2015-01-25 MED ORDER — TERBUTALINE SULFATE 1 MG/ML IJ SOLN: 0.2500 mg | Freq: Once | INTRAMUSCULAR | Status: DC | PRN

## 2015-01-25 MED ORDER — FERROUS SULFATE 325 (65 FE) MG PO TABS
325.0000 mg | ORAL_TABLET | Freq: Two times a day (BID) | ORAL | Status: DC
  Administered 2015-01-26 – 2015-01-27 (×3): 325 mg via ORAL
  Filled 2015-01-25 (×3): qty 1

## 2015-01-25 MED ORDER — IBUPROFEN 800 MG PO TABS
800.0000 mg | ORAL_TABLET | Freq: Four times a day (QID) | ORAL | Status: DC
  Administered 2015-01-25 – 2015-01-27 (×7): 800 mg via ORAL
  Filled 2015-01-25 (×7): qty 1

## 2015-01-25 NOTE — Anesthesia Preprocedure Evaluation (Signed)
Anesthesia Evaluation  Patient identified by MRN, date of birth, ID band Patient awake    Reviewed: Allergy & Precautions, H&P , NPO status , Patient's Chart, lab work & pertinent test results  History of Anesthesia Complications Negative for: history of anesthetic complications  Airway Mallampati: III  TM Distance: >3 FB Neck ROM: full    Dental  (+) Poor Dentition   Pulmonary neg pulmonary ROS,    Pulmonary exam normal breath sounds clear to auscultation       Cardiovascular Exercise Tolerance: Good hypertension, Normal cardiovascular exam Rhythm:regular Rate:Normal     Neuro/Psych  Headaches, negative neurological ROS  negative psych ROS   GI/Hepatic negative GI ROS, Neg liver ROS,   Endo/Other  negative endocrine ROS  Renal/GU negative Renal ROS  negative genitourinary   Musculoskeletal   Abdominal   Peds  Hematology negative hematology ROS (+)   Anesthesia Other Findings Past Medical History:   Scoliosis                                                      Comment:Back brace for 2 years   Migraine                                                    History reviewed. No pertinent surgical history.  BMI    Body Mass Index   33.66 kg/m 2      Reproductive/Obstetrics (+) Pregnancy                             Anesthesia Physical Anesthesia Plan  ASA: III  Anesthesia Plan: Epidural   Post-op Pain Management:    Induction:   Airway Management Planned:   Additional Equipment:   Intra-op Plan:   Post-operative Plan:   Informed Consent: I have reviewed the patients History and Physical, chart, labs and discussed the procedure including the risks, benefits and alternatives for the proposed anesthesia with the patient or authorized representative who has indicated his/her understanding and acceptance.   Dental Advisory Given  Plan Discussed with: Anesthesiologist, CRNA  and Surgeon  Anesthesia Plan Comments: (Patient and husband consented that due to her scoliosis she is at increased risks from epidural placement, including but not limited to, inability to place catheter, one sided block, dural puncture and inadequate analgesia.  They both voiced understanding.)        Anesthesia Quick Evaluation

## 2015-01-25 NOTE — OB Triage Note (Signed)
Patient presented to L&D complaining of leaking of fluid. Denies vaginal bleeding, or decreased fetal movement

## 2015-01-25 NOTE — Anesthesia Procedure Notes (Signed)
Epidural Patient location during procedure: OB Start time: 01/25/2015 9:47 AM End time: 01/25/2015 9:50 AM  Staffing Anesthesiologist: Margorie John K Performed by: anesthesiologist   Preanesthetic Checklist Completed: patient identified, site marked, surgical consent, pre-op evaluation, timeout performed, IV checked, risks and benefits discussed and monitors and equipment checked  Epidural Patient position: sitting Prep: Betadine Patient monitoring: heart rate, continuous pulse ox and blood pressure Approach: midline Location: L4-L5 Injection technique: LOR saline  Needle:  Needle type: Tuohy  Needle gauge: 17 G Needle length: 9 cm and 9 Needle insertion depth: 7 cm Catheter type: closed end flexible Catheter size: 19 Gauge Catheter at skin depth: 11 cm Test dose: negative and 1.5% lidocaine with Epi 1:200 K  Assessment Sensory level: T8 Events: blood not aspirated, injection not painful, no injection resistance, negative IV test and no paresthesia  Additional Notes   Patient tolerated the insertion well without immediate complications.Reason for block:procedure for pain

## 2015-01-25 NOTE — H&P (Signed)
Obstetric History and Physical  Sydney James is a 20 y.o. G1P0 with IUP at [redacted]w[redacted]d presenting for PROM overnight (approximately 11 pm). Patient states she has been having  irregular, every 4-5 minutes contractions, none vaginal bleeding, ruptured, clear fluid membranes, with active fetal movement.  Of note, patient was discharged yesterday from 24 hr observation of BPs, currently with mild pre-eclampsia.  Patient received a full course of antenatal steroids during recent admission.   Prenatal Course Source of Care: [facility] with onset of care at 12 weeks Pregnancy complications or risks: Patient Active Problem List   Diagnosis Date Noted  . Labor and delivery, indication for care 01/25/2015  . PIH (pregnancy induced hypertension), antepartum 01/23/2015  . Anemia affecting pregnancy in first trimester, antepartum 12/15/2014  . Elevated glucose tolerance test 12/03/2014  . Anemia 12/03/2014  . Lightheadedness 11/28/2014  . Renal agenesis, fetal, affecting care of mother, antepartum 11/13/2014  . Amenorrhea, secondary 06/25/2014   She plans to breastfeed She desires oral contraceptives (estrogen/progesterone) for postpartum contraception.   Prenatal labs and studies: ABO, Rh: --/--/O POS (01/22 0126) Antibody: NEG (01/22 0059) Rubella: 1.81 (07/12 0420) RPR: Non Reactive (07/12 0420)  HBsAg: Negative (07/12 0000)  HIV: Non-reactive (07/12 0000)  GBS: unknown 1 hr Glucola  Abnormal (elevated, but 3 hr GTT normal.  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 Scoliosis     Back brace for 2 years  . Migraine     History reviewed. No pertinent past surgical history.  OB History  Gravida Para Term Preterm AB SAB TAB Ectopic Multiple Living  1             # Outcome Date GA Lbr Len/2nd Weight Sex Delivery Anes PTL Lv  1 Current               Social History   Social History  . Marital Status: Married    Spouse Name: N/A  . Number of Children: N/A  . Years of Education: N/A   Social History Main Topics  . Smoking status: Never Smoker   . Smokeless tobacco: Never Used  . Alcohol Use: No  . Drug Use: No  . Sexual Activity: Yes    Birth Control/ Protection: None     Comment: Pregnant   Other Topics Concern  . None   Social History Narrative    Family History  Problem Relation Age of Onset  . Hypertension Father   . Hypertension Mother     Prescriptions prior to admission  Medication Sig Dispense Refill Last Dose  . Iron-FA-B Cmp-C-Biot-Probiotic (FUSION PLUS) CAPS Take 1 capsule by mouth daily. 60 capsule 1 Taking  . Prenatal Vit-Fe Fumarate-FA (MULTIVITAMIN-PRENATAL) 27-0.8 MG TABS tablet Take 1 tablet by mouth daily at 12 noon.   Taking    Allergies  Allergen Reactions  . Aloe   . Lubricants     Has to have water based lubricant  . Tape     Review of Systems: Negative except for what is mentioned in HPI.  Physical Exam: BP 145/74 mmHg  Pulse 73  Temp(Src) 98.2 F (36.8 C) (Oral)  Resp  16  Ht  (1.702 m)  Wt 215 lb (97.523 kg)  BMI 33.67 kg/m2  SpO2 99%  LMP 05/17/2014 (Exact Date) CONSTITUTIONAL: Well-developed, well-nourished female in no acute distress.  HENT:  Normocephalic, atraumatic, External right and left ear normal. Oropharynx is clear and moist EYES: Conjunctivae and EOM are normal. Pupils are equal, round, and reactive to light. No scleral icterus.  NECK: Normal range of motion, supple, no masses SKIN: Skin is warm and dry. No rash noted. Not diaphoretic. No erythema. No  pallor. NEUROLGIC: Alert and oriented to person, place, and time. Normal reflexes, muscle tone coordination. No cranial nerve deficit noted. PSYCHIATRIC: Normal mood and affect. Normal behavior. Normal judgment and thought content. CARDIOVASCULAR: Normal heart rate noted, regular rhythm RESPIRATORY: Effort and breath sounds normal, no problems with respiration noted ABDOMEN: Soft, nontender, nondistended, gravid. MUSCULOSKELETAL: Normal range of motion. No edema and no tenderness. 2+ distal pulses.  Cervical Exam: Dilatation 4 cm   Effacement 90%   Station -1  Presentation: cephalic FHT:  Baseline rate 125 bpm   Variability moderate  Accelerations present   Decelerations none Contractions: Every 2-4 mins   Pertinent Labs/Studies:   Results for orders placed or performed during the hospital encounter of 01/24/15 (from the past 24 hour(s))  CBC     Status: Abnormal   Collection Time: 01/25/15 12:59 AM  Result Value Ref Range   WBC 18.6 (H) 3.6 - 11.0 K/uL   RBC 3.75 (L) 3.80 - 5.20 MIL/uL   Hemoglobin 10.7 (L) 12.0 - 16.0 g/dL   HCT 16.1 (L) 09.6 - 04.5 %   MCV 86.3 80.0 - 100.0 fL   MCH 28.6 26.0 - 34.0 pg   MCHC 33.2 32.0 - 36.0 g/dL   RDW 40.9 81.1 - 91.4 %   Platelets 301 150 - 440 K/uL  Type and screen [facility]     Status: None   Collection Time: 01/25/15 12:59 AM  Result Value Ref Range   ABO/RH(D) O POS    Antibody Screen NEG    Sample Expiration 01/28/2015   Chlamydia/NGC rt PCR ([facility] only)     Status: None   Collection Time: 01/25/15  1:25 AM  Result Value Ref Range   Specimen source GC/Chlam URINE, RANDOM    Chlamydia Tr NOT DETECTED NOT DETECTED   N gonorrhoeae NOT DETECTED NOT DETECTED  ABO/Rh     Status: None   Collection Time: 01/25/15  1:26 AM  Result Value Ref Range   ABO/RH(D) O POS    Results for Sydney James (MRN [id number]) as of 01/25/2015 11:28  Ref. Range 01/23/2015 10:58  Protein Urine Random Latest Ref Range: Not Estab.  mg/dL 08.6  Creatinine, Urine Latest Ref Range: Not Estab. mg/dL 57.8  Protein/Creat Ratio Latest Ref Range: 0-200 mg/g creat 518 (H)  Total Protein Latest Ref Range: 6.0-8.5 g/dL 5.7 (L)   Assessment : Sydney James is a 20 y.o. G1P0 at [redacted]w[redacted]d being admitted for labor.  Plan: Labor: Active management.  Induction with Pitocin, per protocol.  Continue to monitor BPs and signs/symptoms of severe pre-eclampsia.  FWB: Reassuring fetal heart tracing.  GBS unknown.  Will treat with Ampicillin.  Received full course of antenatal steroids this weekend.  Delivery plan: Hopeful for vaginal delivery.  Will make Pediatricians aware of fetal renal agenesis.    Hildred Laser, MD [facility]

## 2015-01-26 LAB — CULTURE, BETA STREP (GROUP B ONLY)

## 2015-01-26 LAB — CBC
HEMATOCRIT: 27 % — AB (ref 35.0–47.0)
Hemoglobin: 8.9 g/dL — ABNORMAL LOW (ref 12.0–16.0)
MCH: 28.9 pg (ref 26.0–34.0)
MCHC: 33 g/dL (ref 32.0–36.0)
MCV: 87.6 fL (ref 80.0–100.0)
Platelets: 236 10*3/uL (ref 150–440)
RBC: 3.09 MIL/uL — AB (ref 3.80–5.20)
RDW: 14 % (ref 11.5–14.5)
WBC: 17.7 10*3/uL — AB (ref 3.6–11.0)

## 2015-01-26 LAB — PROTEIN, URINE, 24 HOUR
COLLECTION INTERVAL-UPROT: 24 h
PROTEIN, 24H URINE: 815 mg/d — AB (ref 50–100)
Protein, Urine: 19 mg/dL
URINE TOTAL VOLUME-UPROT: 4290 mL

## 2015-01-26 LAB — RPR: RPR: NONREACTIVE

## 2015-01-26 NOTE — Progress Notes (Signed)
Post Partum Day #1 s/p SVD, 1st degree right labial laceration repair.  Subjective: no complaints, up ad lib, voiding, tolerating PO and + flatus  Objective: Blood pressure 136/96, pulse 66, temperature 97.1 F (36.2 C), temperature source Axillary, resp. rate 18, height  (1.702 m), weight 215 lb (97.523 kg), last menstrual period 05/17/2014, SpO2 93 %, unknown if currently breastfeeding.  Physical Exam:  General: alert and no distress Lochia: appropriate Uterine Fundus: firm Incision: None DVT Evaluation: No evidence of DVT seen on physical exam. Negative Homan's sign. No cords or calf tenderness. No significant calf/ankle edema.   Recent Labs  01/25/15 0059 01/26/15 0624  HGB 10.7* 8.9*  HCT 32.3* 27.0*    Assessment/Plan: Plan for discharge tomorrow, Breastfeeding, Circumcision prior to discharge and Contraception to be discussed Anemia of pregnancy - asymptomatic.  Will treat with ferrous sulfate BID.    LOS: 1 day   Hildred Laser 01/26/2015, 9:27 AM

## 2015-01-26 NOTE — Progress Notes (Signed)
Post Partum Day 1 Subjective: no complaints and up ad lib  Objective: Blood pressure 136/96, pulse 66, temperature 97.1 F (36.2 C), temperature source Axillary, resp. rate 18, height  (1.702 m), weight 97.523 kg (215 lb), last menstrual period 05/17/2014, SpO2 93 %, unknown if currently breastfeeding.  Physical Exam:  General: alert, cooperative, appears stated age and pale Lochia: appropriate Uterine Fundus: not examined, patient up in SCN    Recent Labs  01/25/15 0059 01/26/15 0624  HGB 10.7* 8.9*  HCT 32.3* 27.0*    Assessment/Plan: Plan for discharge tomorrow and Breastfeeding Infant feeding soley Breast;    LOS: 1 day   Melody N Shambley 01/26/2015, 10:59 AM

## 2015-01-27 ENCOUNTER — Encounter: Payer: 59 | Admitting: Obstetrics and Gynecology

## 2015-01-27 MED ORDER — DOCUSATE SODIUM 100 MG PO CAPS: 100.0000 mg | ORAL_CAPSULE | Freq: Two times a day (BID) | ORAL | Status: DC | PRN

## 2015-01-27 MED ORDER — FERROUS SULFATE 325 (65 FE) MG PO TABS: 325.0000 mg | ORAL_TABLET | Freq: Two times a day (BID) | ORAL | Status: DC

## 2015-01-27 NOTE — Progress Notes (Signed)
Patient understands all discharge instructions and the need to make follow up appointments. Patient discharge via wheelchair with auxillary. 

## 2015-01-27 NOTE — Discharge Instructions (Signed)
General Postpartum Discharge Instructions ° °Do not drink alcohol or take tranquilizers.  °Do not take medicine that has not been prescribed by your doctor.  °Take showers instead of baths until your doctor gives you permission to take baths.  °No sexual intercourse or placement of anything in the vagina for 6 weeks or as instructed by your doctor. °Only take prescription or over-the-counter medicines  for pain, discomfort, or fever as directed by your doctor. Take medicines (antibiotics) that kill germs if they are prescribed for you. °  °Call the office or go to the Emergency Room if:  °You feel sick to your stomach (nauseous).  °You start to throw up (vomit).  °You have trouble eating or drinking.  °You have an oral temperature above 101.  °You have constipation that is not helped by adjusting diet or increasing fluid intake. Pain medicines are a common cause of constipation.  °You have foul smelling vaginal discharge or odor.  °You have bleeding requiring changing more than 1 pad per hour. °You have any other concerns. ° °SEEK IMMEDIATE MEDICAL CARE IF:  °You have persistent dizziness.  °You have difficulty breathing or shortness of breath.  °You have an oral temperature above 102.5, not controlled by medicine.  ° ° ° °Bleeding: Your bleeding could continue up to 6 weeks, the flow should gradually decrease and the color should become dark then lightened over the next couple of weeks. If you notice you are bleeding heavily or passing clots larger than the size of your fist, PLEASE call your physician. No TAMPONS, DOUCHING, ENEMAS OR SEXUAL INTERCOURSE for 6 weeks.  ° °Stitches: Shower daily with mild soap and water. Stitches will dissolve over the next couple of weeks, if you experience any discomfort in the vaginal area you may sit in warm water 15-20 minutes, 3-4 times per day. Just enough water to cover vaginal area.  ° °AfterPains: This is the uterus contracting back to its normal position and size. Use  medications prescribed or recommended by your physician to help relieve this discomfort.  ° °Bowels/Hemorrhoids: Drink plenty of water and stay active. Increase fiber, fresh fruits and vegetables in your diet.  ° °Rest/Activity: Rest when the baby is resting ° °Bathing: Shower daily! ° °Diet: Continue to eat extra calories until your follow up visit to help replenish nutrients and vitamins. If breastfeeding eat an extra 500-1000 and increase your fluid intake to 12 glasses a day.  ° °Contraception: Consult with your physician on what method of birth control you would like to use.  ° °Postpartum "BLUES": It is common to emotional days after delivery, however if it persist for greater than 2 weeks or if you feel concerned please let your physician know immediately. This is hormone driven and nothing you can control so please let someone know how you feel. ° °Follow Up Visit: Please schedule a follow up visit with your physician.  °

## 2015-01-27 NOTE — Discharge Summary (Signed)
Obstetric Discharge Summary Reason for Admission: onset of labor Prenatal Procedures: NST, Preeclampsia (mild) and ultrasound Intrapartum Procedures: spontaneous vaginal delivery Postpartum Procedures: none Complications-Operative and Postpartum: 1st degree labial laceration (right)  CBC Latest Ref Rng 01/26/2015 01/25/2015 01/23/2015  WBC 3.6 - 11.0 K/uL 17.7(H) 18.6(H) 12.3(H)  Hemoglobin 12.0 - 16.0 g/dL 1.6(X) 10.7(L) -  Hematocrit 35.0 - 47.0 % 27.0(L) 32.3(L) 31.0(L)  Platelets 150 - 440 K/uL 236 301 286    Physical Exam:  General: alert and no distress Lochia: appropriate Uterine Fundus: firm Incision: None DVT Evaluation: No evidence of DVT seen on physical exam. Negative Homan's sign. No cords or calf tenderness. No significant calf/ankle edema.  Discharge Diagnoses: Premature labor and Preelampsia (mild)  Discharge Information: Date: 01/27/2015 Activity: pelvic rest Diet: routine Medications: PNV, Ibuprofen, Colace and Iron Condition: stable Instructions: refer to practice specific booklet Discharge to: home Follow-up Information    Follow up with Melody Suzan Nailer, CNM In 6 weeks.   Specialties:  Obstetrics and Gynecology, Radiology   Why:  Postpartum visit   Contact information:   [address] [place] [address] [phone]       Newborn Data: Live born female  Birth Weight: 6 lb 4.2 oz (2840 g) APGAR: 8, 9  Home with mother.  Sydney James 01/27/2015, 9:28 AM

## 2015-01-27 NOTE — Progress Notes (Signed)
Post Partum Day #1 s/p SVD, 1st degree right labial laceration repair, h/o mild pre-elampsia.  Subjective: no complaints, up ad lib, voiding, tolerating PO and + flatus  Objective: [name] Kitchen Physical Exam:  General: alert and no distress Lochia: appropriate Uterine Fundus: firm Incision: None DVT Evaluation: No evidence of DVT seen on physical exam. Negative Homan's sign. No cords or calf tenderness. No significant calf/ankle edema.   Recent Labs  01/25/15 0059 01/26/15 0624  HGB 10.7* 8.9*  HCT 32.3* 27.0*    Assessment/Plan: Discharge home, Breastfeeding, Circumcision prior to discharge and Contraception desires Depo Provera  Anemia of pregnancy - asymptomatic.  Will treat with ferrous sulfate BID.  Mild pre-eclampsia - BPs with mild elevations.  Does not require medication postpartum.    LOS: 2 days   Hildred Laser 01/27/2015, 9:26 AM

## 2015-01-27 NOTE — Anesthesia Postprocedure Evaluation (Signed)
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  Procedure(s) Performed: * No procedures listed *  Patient location during evaluation: Mother Baby Anesthesia Type: Epidural Level of consciousness: awake and alert and oriented Pain management: pain level controlled Vital Signs Assessment: post-procedure vital signs reviewed and stable Respiratory status: spontaneous breathing and nonlabored ventilation Cardiovascular status: blood pressure returned to baseline and stable Postop Assessment: no headache and no backache Anesthetic complications: no    Last Vitals:  Filed Vitals:   01/27/15 0355 01/27/15 0356  BP: 125/84   Pulse: 72 73  Temp: 36.7 C   Resp: 18     Last Pain:  Filed Vitals:   01/27/15 0356  PainSc: 0-No pain                 Ginger Carne

## 2015-01-29 ENCOUNTER — Ambulatory Visit: Payer: Self-pay

## 2015-01-29 NOTE — Lactation Note (Signed)
This note was copied from the chart of Sydney James. Lactation Consultation Note  Patient Name: Sydney Miyoko Hashimi [id number] Date: 01/29/2015 Reason for consult: Follow-up assessment   Maternal Data  A nipple shield was used.  Feeding Feeding Type: Breast Fed (and bottle fed) Nipple Type: Slow - flow Length of feed: 40 min  LATCH Score/Interventions Latch: Repeated attempts needed to sustain latch, nipple held in mouth throughout feeding, stimulation needed to elicit sucking reflex. Intervention(s): Assist with latch;Adjust position;Breast compression  Audible Swallowing: Spontaneous and intermittent Intervention(s): Alternate breast massage;Hand expression  Type of Nipple: Flat Intervention(s): Reverse pressure  Comfort (Breast/Nipple): Engorged, cracked, bleeding, large blisters, severe discomfort Problem noted: Cracked, bleeding, blisters, bruises;Engorgment     Intervention(s): Breastfeeding basics reviewed;Support Pillows;Position options     Lactation Tools Discussed/Used     Consult Status Consult Status: Follow-up Follow-up type: In-patient    Trudee Grip 01/29/2015, 5:31 PM

## 2015-01-31 ENCOUNTER — Encounter: Payer: Self-pay | Admitting: Emergency Medicine

## 2015-01-31 ENCOUNTER — Emergency Department
Admission: EM | Admit: 2015-01-31 | Discharge: 2015-01-31 | Disposition: A | Discharge status: Home / Self Care | Payer: 59 | Attending: Emergency Medicine | Admitting: Emergency Medicine

## 2015-01-31 DIAGNOSIS — Z792 Long term (current) use of antibiotics: Secondary | ICD-10-CM | POA: Insufficient documentation

## 2015-01-31 DIAGNOSIS — I159 Secondary hypertension, unspecified: Secondary | ICD-10-CM | POA: Diagnosis not present

## 2015-01-31 DIAGNOSIS — O1093 Unspecified pre-existing hypertension complicating the puerperium: Secondary | ICD-10-CM | POA: Diagnosis present

## 2015-01-31 DIAGNOSIS — N390 Urinary tract infection, site not specified: Secondary | ICD-10-CM

## 2015-01-31 DIAGNOSIS — B9789 Other viral agents as the cause of diseases classified elsewhere: Secondary | ICD-10-CM | POA: Insufficient documentation

## 2015-01-31 DIAGNOSIS — Z79899 Other long term (current) drug therapy: Secondary | ICD-10-CM | POA: Diagnosis not present

## 2015-01-31 DIAGNOSIS — O1043 Pre-existing secondary hypertension complicating the puerperium: Secondary | ICD-10-CM | POA: Diagnosis not present

## 2015-01-31 DIAGNOSIS — R51 Headache: Secondary | ICD-10-CM | POA: Insufficient documentation

## 2015-01-31 DIAGNOSIS — R319 Hematuria, unspecified: Secondary | ICD-10-CM

## 2015-01-31 DIAGNOSIS — O8620 Urinary tract infection following delivery, unspecified: Secondary | ICD-10-CM | POA: Diagnosis not present

## 2015-01-31 HISTORY — DX: Unspecified pre-eclampsia, unspecified trimester: O14.90

## 2015-01-31 LAB — URINALYSIS COMPLETE WITH MICROSCOPIC (ARMC ONLY)
Bilirubin Urine: NEGATIVE
Glucose, UA: NEGATIVE mg/dL
Ketones, ur: NEGATIVE mg/dL
NITRITE: NEGATIVE
PH: 7 (ref 5.0–8.0)
PROTEIN: 30 mg/dL — AB
Specific Gravity, Urine: 1.008 (ref 1.005–1.030)

## 2015-01-31 LAB — CBC WITH DIFFERENTIAL/PLATELET
BASOS PCT: 0 %
Basophils Absolute: 0 10*3/uL (ref 0–0.1)
EOS ABS: 0.4 10*3/uL (ref 0–0.7)
EOS PCT: 3 %
HCT: 30.6 % — ABNORMAL LOW (ref 35.0–47.0)
Hemoglobin: 10.4 g/dL — ABNORMAL LOW (ref 12.0–16.0)
Lymphocytes Relative: 13 %
Lymphs Abs: 1.6 10*3/uL (ref 1.0–3.6)
MCH: 29.5 pg (ref 26.0–34.0)
MCHC: 33.8 g/dL (ref 32.0–36.0)
MCV: 87.2 fL (ref 80.0–100.0)
MONO ABS: 0.8 10*3/uL (ref 0.2–0.9)
MONOS PCT: 7 %
Neutro Abs: 9.6 10*3/uL — ABNORMAL HIGH (ref 1.4–6.5)
Neutrophils Relative %: 77 %
PLATELETS: 336 10*3/uL (ref 150–440)
RBC: 3.51 MIL/uL — ABNORMAL LOW (ref 3.80–5.20)
RDW: 14.3 % (ref 11.5–14.5)
WBC: 12.5 10*3/uL — ABNORMAL HIGH (ref 3.6–11.0)

## 2015-01-31 LAB — COMPREHENSIVE METABOLIC PANEL
ALBUMIN: 2.7 g/dL — AB (ref 3.5–5.0)
ALT: 49 U/L (ref 14–54)
ANION GAP: 11 (ref 5–15)
AST: 28 U/L (ref 15–41)
Alkaline Phosphatase: 104 U/L (ref 38–126)
BUN: 12 mg/dL (ref 6–20)
CALCIUM: 8.5 mg/dL — AB (ref 8.9–10.3)
CO2: 21 mmol/L — AB (ref 22–32)
CREATININE: 0.67 mg/dL (ref 0.44–1.00)
Chloride: 107 mmol/L (ref 101–111)
GFR calc non Af Amer: >60 mL/min (ref >60)
GLUCOSE: 99 mg/dL (ref 65–99)
Potassium: 3.6 mmol/L (ref 3.5–5.1)
SODIUM: 139 mmol/L (ref 135–145)
TOTAL PROTEIN: 6.6 g/dL (ref 6.5–8.1)
Total Bilirubin: 0.6 mg/dL (ref 0.3–1.2)

## 2015-01-31 MED ORDER — CEPHALEXIN 500 MG PO CAPS: 500.0000 mg | ORAL_CAPSULE | Freq: Two times a day (BID) | ORAL | Status: DC

## 2015-01-31 MED ORDER — CEPHALEXIN 500 MG PO CAPS
500.0000 mg | ORAL_CAPSULE | Freq: Once | ORAL | Status: AC
  Administered 2015-01-31: 500 mg via ORAL
  Filled 2015-01-31: qty 1

## 2015-01-31 MED ORDER — LABETALOL HCL 200 MG PO TABS
200.0000 mg | ORAL_TABLET | Freq: Once | ORAL | Status: AC
  Administered 2015-01-31: 200 mg via ORAL
  Filled 2015-01-31: qty 1

## 2015-01-31 MED ORDER — LABETALOL HCL 200 MG PO TABS: 200.0000 mg | ORAL_TABLET | Freq: Two times a day (BID) | ORAL | Status: DC

## 2015-01-31 NOTE — ED Notes (Signed)
Pt states her BP has been "high, in the 160s". Upon room assignment BP 144/88.

## 2015-01-31 NOTE — ED Notes (Signed)
Reports vaginal delivery on Sunday, preeclampsia and still having swelling and htn.

## 2015-01-31 NOTE — ED Provider Notes (Signed)
[facility] Emergency Department Provider Note   ____________________________________________  Time seen: Approximately 12 PM I have reviewed the triage vital signs and the triage nursing note.  HISTORY  Chief Complaint Hypertension   Historian Patient  HPI Sydney James is a 20 y.o. female who vaginally delivered on Sunday, 6 days ago, at approximately 36 weeks due to spontaneous labor. The day before she had been admitted for high blood pressures and rule out preeclampsia. She was in the middle of receiving her 24-hour urine collection when her water broke and she delivered her son.  She reports lower extremity edema throughout the last few weeks of her pregnancy and continuing on now. She has been checking her blood pressure at home and has been in the 140s systolically and up to the 160s over 100s. She's had a mild generalized headache for a couple of days. No upper abdominal pain. No easy bruising. Denies frequency of urination. Denies lower abdominal pain.  No chest pain or trouble breathing or shortness of breath. Symptoms are mild to moderate.    Past Medical History  Diagnosis Date  . Scoliosis     Back brace for 2 years  . Migraine   . Preeclampsia   . Scoliosis     Patient Active Problem List   Diagnosis Date Noted  . Labor and delivery, indication for care 01/25/2015  . PIH (pregnancy induced hypertension), antepartum 01/23/2015  . Anemia affecting pregnancy in first trimester, antepartum 12/15/2014  . Elevated glucose tolerance test 12/03/2014  . Anemia 12/03/2014  . Lightheadedness 11/28/2014  . Renal agenesis, fetal, affecting care of mother, antepartum 11/13/2014  . Amenorrhea, secondary 06/25/2014    History reviewed. No pertinent past surgical history.  Current Outpatient Rx  Name  Route  Sig  Dispense  Refill  . ferrous sulfate 325 (65 FE) MG tablet   Oral   Take 1 tablet (325 mg total) by mouth 2 (two) times daily with  a meal.   60 tablet   2   . Prenatal Vit-Fe Fumarate-FA (MULTIVITAMIN-PRENATAL) 27-0.8 MG TABS tablet   Oral   Take 1 tablet by mouth daily at 12 noon.         . cephALEXin (KEFLEX) 500 MG capsule   Oral   Take 1 capsule (500 mg total) by mouth 2 (two) times daily.   13 capsule   0   . docusate sodium (COLACE) 100 MG capsule   Oral   Take 1 capsule (100 mg total) by mouth 2 (two) times daily as needed for mild constipation.   60 capsule   1   . labetalol (NORMODYNE) 200 MG tablet   Oral   Take 1 tablet (200 mg total) by mouth 2 (two) times daily.   60 tablet   0     Allergies Aloe; Lubricants; and Tape  Family History  Problem Relation Age of Onset  . Hypertension Father   . Hypertension Mother     Social History Social History  Substance Use Topics  . Smoking status: Never Smoker   . Smokeless tobacco: Never Used  . Alcohol Use: No    Review of Systems  Constitutional: Negative for fever. Eyes: Negative for visual changes. ENT: Negative for sore throat. Cardiovascular: Negative for chest pain. Respiratory: Negative for shortness of breath. Gastrointestinal: Negative for abdominal pain, vomiting and diarrhea. Genitourinary: Negative for dysuria. Musculoskeletal: Negative for back pain. Skin: Negative for rash. Neurological: Positive for headache. 10 point Review of Systems otherwise negative  ____________________________________________   PHYSICAL EXAM:  VITAL SIGNS: ED Triage Vitals  Enc Vitals Group     BP 01/31/15 1023 161/96 mmHg     Pulse Rate 01/31/15 1023 93     Resp 01/31/15 1023 20     Temp 01/31/15 1023 98.3 F (36.8 C)     Temp Source 01/31/15 1023 Oral     SpO2 01/31/15 1023 97 %     Weight 01/31/15 1023 193 lb (87.544 kg)     Height 01/31/15 1023  (1.702 m)     Head Cir --      Peak Flow --      Pain Score 01/31/15 1024 1     Pain Loc --      Pain Edu? --      Excl. in GC? --      Constitutional: Alert and  oriented. Well appearing and in no distress. Eyes: Conjunctivae are normal. PERRL. Normal extraocular movements. ENT   Head: Normocephalic and atraumatic.   Nose: No congestion/rhinnorhea.   Mouth/Throat: Mucous membranes are moist.   Neck: No stridor. Cardiovascular/Chest: Normal rate, regular rhythm.  No murmurs, rubs, or gallops. Respiratory: Normal respiratory effort without tachypnea nor retractions. Breath sounds are clear and equal bilaterally. No wheezes/rales/rhonchi. Gastrointestinal: Soft. No distention, no guarding, no rebound. Nontender in 4 quadrants.  Genitourinary/rectal:Deferred Musculoskeletal: Nontender with normal range of motion in all extremities. No joint effusions.  No lower extremity tenderness. 2+ lower extremity pitting edema bilaterally Neurologic:  Normal speech and language. No gross or focal neurologic deficits are appreciated. Skin:  Skin is warm, dry and intact. No rash noted. Psychiatric: Mood and affect are normal. Speech and behavior are normal. Patient exhibits appropriate insight and judgment.  ____________________________________________   EKG I, Governor Rooks, MD, the attending physician have personally viewed and interpreted all ECGs.  None ____________________________________________  LABS (pertinent positives/negatives)  Urinalysis 30 protein, leukocytes 3+, red blood cells and white blood cells too numerous to count, rare bacteria and squamous epithelial cells 0-5 next line comprehensive metabolic panel without significant abnormality with normal LFTs White blood count 12.5, hemoglobin 10.4 and platelet count 336  ____________________________________________  RADIOLOGY All Xrays were viewed by me. Imaging interpreted by Radiologist.  None __________________________________________  PROCEDURES  Procedure(s) performed: None  Critical Care performed: None  ____________________________________________   ED COURSE /  ASSESSMENT AND PLAN  Pertinent labs & imaging results that were available during my care of the patient were reviewed by me and considered in my medical decision making (see chart for details).   Patient is here with elevated blood pressure and mild generalized headache, and needs to be ruled out for preeclampsia.  No right upper quadrant tenderness on exam. She does have lower extremity pitting edema 2+ bilateral lower x-rays. No shortness of breath.  On laboratory evaluation, her LFTs are within normal limits, no low platelets, normal creatinine, and mild proteinuria at 30.  No evidence of preeclampsia complication. Patient will be started on antihypertensive.  Her urinalysis is consistent with urinary tract infection. I did press on her abdomen again, she has no suprapubic tenderness. Denies any change in vaginal discharge and I'm not suspicious for endometritis.  I discussed this case with Dr. Valentino Saxon, OB/GYN who recommended labetalol 200 mg twice a day and follow-up in the next 2-4 days in the office. Patient will call on Monday. She will be started on Keflex for urinary tract infection. A culture was sent.    CONSULTATIONS:   Dr. Valentino Saxon,  OB gyn, phone consultation.   Patient / Family / Caregiver informed of clinical course, medical decision-making process, and agree with plan.   I discussed return precautions, follow-up instructions, and discharged instructions with patient and/or family.   ___________________________________________   FINAL CLINICAL IMPRESSION(S) / ED DIAGNOSES   Final diagnoses:  Secondary hypertension, unspecified  Urinary tract infection with hematuria, site unspecified              Note: This dictation was prepared with Dragon dictation. Any transcriptional errors that result from this process are unintentional   Governor Rooks, MD 01/31/15 1349

## 2015-01-31 NOTE — Discharge Instructions (Signed)
You were evaluated for high blood pressure and your exam and evaluation are reassuring other than the high blood pressure. You're being started on labetalol for blood pressure, twice daily. He will also found to have evidence of urinary tract infection and are being placed on antibiotic Keflex.  Return to the emergency room for any worsening condition including blood pressure higher than 200/110, headache, confusion, weakness or numbness, new or worsening lower extremity swelling, any abdominal pain especially in the right upper abdomen, or any easy bruising or bleeding.    Urinary Tract Infection Urinary tract infections (UTIs) can develop anywhere along your urinary tract. Your urinary tract is your body's drainage system for removing wastes and extra water. Your urinary tract includes two kidneys, two ureters, a bladder, and a urethra. Your kidneys are a pair of bean-shaped organs. Each kidney is about the size of your fist. They are located below your ribs, one on each side of your spine. CAUSES Infections are caused by microbes, which are microscopic organisms, including fungi, viruses, and bacteria. These organisms are so small that they can only be seen through a microscope. Bacteria are the microbes that most commonly cause UTIs. SYMPTOMS  Symptoms of UTIs may vary by age and gender of the patient and by the location of the infection. Symptoms in young women typically include a frequent and intense urge to urinate and a painful, burning feeling in the bladder or urethra during urination. Older women and men are more likely to be tired, shaky, and weak and have muscle aches and abdominal pain. A fever may mean the infection is in your kidneys. Other symptoms of a kidney infection include pain in your back or sides below the ribs, nausea, and vomiting. DIAGNOSIS To diagnose a UTI, your caregiver will ask you about your symptoms. Your caregiver will also ask you to provide a urine sample. The urine  sample will be tested for bacteria and white blood cells. White blood cells are made by your body to help fight infection. TREATMENT  Typically, UTIs can be treated with medication. Because most UTIs are caused by a bacterial infection, they usually can be treated with the use of antibiotics. The choice of antibiotic and length of treatment depend on your symptoms and the type of bacteria causing your infection. HOME CARE INSTRUCTIONS  If you were prescribed antibiotics, take them exactly as your caregiver instructs you. Finish the medication even if you feel better after you have only taken some of the medication.  Drink enough water and fluids to keep your urine clear or pale yellow.  Avoid caffeine, tea, and carbonated beverages. They tend to irritate your bladder.  Empty your bladder often. Avoid holding urine for long periods of time.  Empty your bladder before and after sexual intercourse.  After a bowel movement, women should cleanse from front to back. Use each tissue only once. SEEK MEDICAL CARE IF:   You have back pain.  You develop a fever.  Your symptoms do not begin to resolve within 3 days. SEEK IMMEDIATE MEDICAL CARE IF:   You have severe back pain or lower abdominal pain.  You develop chills.  You have nausea or vomiting.  You have continued burning or discomfort with urination. MAKE SURE YOU:   Understand these instructions.  Will watch your condition.  Will get help right away if you are not doing well or get worse.   This information is not intended to replace advice given to you by your health care  provider. Make sure you discuss any questions you have with your health care provider.   Document Released: 09/29/2004 Document Revised: 09/10/2014 Document Reviewed: 01/28/2011 Elsevier Interactive Patient Education 2016 [employer].  Hypertension Hypertension is another name for high blood pressure. High blood pressure forces your heart to work harder  to pump blood. A blood pressure reading has two numbers, which includes a higher number over a lower number (example: 110/72). HOME CARE   Have your blood pressure rechecked by your doctor.  Only take medicine as told by your doctor. Follow the directions carefully. The medicine does not work as well if you skip doses. Skipping doses also puts you at risk for problems.  Do not smoke.  Monitor your blood pressure at home as told by your doctor. GET HELP IF:  You think you are having a reaction to the medicine you are taking.  You have repeat headaches or feel dizzy.  You have puffiness (swelling) in your ankles.  You have trouble with your vision. GET HELP RIGHT AWAY IF:   You get a very bad headache and are confused.  You feel weak, numb, or faint.  You get chest or belly (abdominal) pain.  You throw up (vomit).  You cannot breathe very well. MAKE SURE YOU:   Understand these instructions.  Will watch your condition.  Will get help right away if you are not doing well or get worse.   This information is not intended to replace advice given to you by your health care provider. Make sure you discuss any questions you have with your health care provider.   Document Released: 06/08/2007 Document Revised: 12/25/2012 Document Reviewed: 10/12/2012 Elsevier Interactive Patient Education 2016 [employer] Inc[employer]   Preeclampsia and Eclampsia Preeclampsia is a serious condition that develops only during pregnancy. It is also called toxemia of pregnancy. This condition causes high blood pressure along with other symptoms, such as swelling and headaches. These may develop as the condition gets worse. Preeclampsia may occur 20 weeks or later into your pregnancy.  Diagnosing and treating preeclampsia early is very important. If not treated early, it can cause serious problems for you and your baby. One problem it can lead to is eclampsia, which is a condition that causes muscle jerking or  shaking (convulsions) in the mother. Delivering your baby is the best treatment for preeclampsia or eclampsia.  RISK FACTORS The cause of preeclampsia is not known. You may be more likely to develop preeclampsia if you have certain risk factors. These include:   Being pregnant for the first time.  Having preeclampsia in a past pregnancy.  Having a family history of preeclampsia.  Having high blood pressure.  Being pregnant with twins or triplets.  Being 26 or older.  Being African American.  Having kidney disease or diabetes.  Having medical conditions such as lupus or blood diseases.  Being very overweight (obese). SIGNS AND SYMPTOMS  The earliest signs of preeclampsia are:  High blood pressure.  Increased protein in your urine. Your health care provider will check for this at every prenatal visit. Other symptoms that can develop include:   Severe headaches.  Sudden weight gain.  Swelling of your hands, face, legs, and feet.  Feeling sick to your stomach (nauseous) and throwing up (vomiting).  Vision problems (blurred or double vision).  Numbness in your face, arms, legs, and feet.  Dizziness.  Slurred speech.  Sensitivity to bright lights.  Abdominal pain. DIAGNOSIS  There are no screening tests for preeclampsia. Your  health care provider will ask you about symptoms and check for signs of preeclampsia during your prenatal visits. You may also have tests, including:  Urine testing.  Blood testing.  Checking your baby's heart rate.  Checking the health of your baby and your placenta using images created with sound waves (ultrasound). TREATMENT  You can work out the best treatment approach together with your health care provider. It is very important to keep all prenatal appointments. If you have an increased risk of preeclampsia, you may need more frequent prenatal exams.  Your health care provider may prescribe bed rest.  You may have to eat as little  salt as possible.  You may need to take medicine to lower your blood pressure if the condition does not respond to more conservative measures.  You may need to stay in the hospital if your condition is severe. There, treatment will focus on controlling your blood pressure and fluid retention. You may also need to take medicine to prevent seizures.  If the condition gets worse, your baby may need to be delivered early to protect you and the baby. You may have your labor started with medicine (be induced), or you may have a cesarean delivery.  Preeclampsia usually goes away after the baby is born. HOME CARE INSTRUCTIONS   Only take over-the-counter or prescription medicines as directed by your health care provider.  Lie on your left side while resting. This keeps pressure off your baby.  Elevate your feet while resting.  Get regular exercise. Ask your health care provider what type of exercise is safe for you.  Avoid caffeine and alcohol.  Do not smoke.  Drink 6-8 glasses of water every day.  Eat a balanced diet that is low in salt. Do not add salt to your food.  Avoid stressful situations as much as possible.  Get plenty of rest and sleep.  Keep all prenatal appointments and tests as scheduled. SEEK MEDICAL CARE IF:  You are gaining more weight than expected.  You have any headaches, abdominal pain, or nausea.  You are bruising more than usual.  You feel dizzy or light-headed. SEEK IMMEDIATE MEDICAL CARE IF:   You develop sudden or severe swelling anywhere in your body. This usually happens in the legs.  You gain 5 lb (2.3 kg) or more in a week.  You have a severe headache, dizziness, problems with your vision, or confusion.  You have severe abdominal pain.  You have lasting nausea or vomiting.  You have a seizure.  You have trouble moving any part of your body.  You develop numbness in your body.  You have trouble speaking.  You have any abnormal  bleeding.  You develop a stiff neck.  You pass out. MAKE SURE YOU:   Understand these instructions.  Will watch your condition.  Will get help right away if you are not doing well or get worse.   This information is not intended to replace advice given to you by your health care provider. Make sure you discuss any questions you have with your health care provider.   Document Released: 12/18/1999 Document Revised: 12/25/2012 Document Reviewed: 10/12/2012 Elsevier Interactive Patient Education [employer]

## 2015-01-31 NOTE — ED Notes (Signed)
Pt verbalized understanding of discharge instructions. NAD at this time. 

## 2015-02-01 LAB — URINE CULTURE

## 2015-02-03 ENCOUNTER — Encounter: Payer: 59 | Admitting: Obstetrics and Gynecology

## 2015-02-05 ENCOUNTER — Ambulatory Visit (INDEPENDENT_AMBULATORY_CARE_PROVIDER_SITE_OTHER): Payer: 59 | Admitting: Obstetrics and Gynecology

## 2015-02-05 ENCOUNTER — Encounter: Payer: Self-pay | Admitting: Obstetrics and Gynecology

## 2015-02-05 VITALS — BP 113/84 | HR 82 | Ht 67.0 in | Wt 190.4 lb

## 2015-02-05 DIAGNOSIS — O165 Unspecified maternal hypertension, complicating the puerperium: Secondary | ICD-10-CM

## 2015-02-05 MED ORDER — NORETHINDRONE 0.35 MG PO TABS: 1.0000 | ORAL_TABLET | Freq: Every day | ORAL | Status: DC

## 2015-02-05 NOTE — Progress Notes (Signed)
Here for BP check at 2 weeks PP, denies concerns, down 25#s Filed Vitals:   02/05/15 1652  Height:  (1.702 m)  Weight: 190 lb 6.4 oz (86.365 kg)  113/73 A: PP HTN  P: continue current BP med and dose, add Camila this Sunday for [facility]. RTC in 4 weeks for routine PPV.  Sydney James [place], CNM

## 2015-02-10 ENCOUNTER — Encounter: Payer: 59 | Admitting: Obstetrics and Gynecology

## 2015-03-10 ENCOUNTER — Encounter: Payer: Self-pay | Admitting: Obstetrics and Gynecology

## 2015-03-10 ENCOUNTER — Ambulatory Visit (INDEPENDENT_AMBULATORY_CARE_PROVIDER_SITE_OTHER): Payer: 59 | Admitting: Obstetrics and Gynecology

## 2015-03-10 DIAGNOSIS — R197 Diarrhea, unspecified: Secondary | ICD-10-CM

## 2015-03-10 MED ORDER — METOCLOPRAMIDE HCL 10 MG PO TABS: 20.0000 mg | ORAL_TABLET | Freq: Three times a day (TID) | ORAL | Status: DC

## 2015-03-10 MED ORDER — ACIDOPHILUS 90-25 MG PO CHEW: 1.0000 | CHEWABLE_TABLET | Freq: Three times a day (TID) | ORAL | Status: DC

## 2015-03-10 NOTE — Progress Notes (Signed)
  Subjective:     Sydney James is a 20 y.o. female who presents for a postpartum visit. She is 6 weeks postpartum following a spontaneous vaginal delivery. I have fully reviewed the prenatal and intrapartum course. The delivery was at 36 gestational weeks. Outcome: spontaneous vaginal delivery. Anesthesia: epidural. Postpartum course has been complicated by low milk supply. Baby's course has been uneventful. Baby is feeding by breast. Bleeding no bleeding. Bowel function is abnormal: diarrhea daily and decreased appetite since delivery- did get antibiotics in labor. Complains of clitoral tenderness since delivery. Bladder function is normal. Patient is sexually active. Contraception method is condoms. Postpartum depression screening: negative.  The following portions of the patient's history were reviewed and updated as appropriate: allergies, current medications, past family history, past medical history, past social history, past surgical history and problem list.  Review of Systems Pertinent items noted in HPI and remainder of comprehensive ROS otherwise negative.   Objective:    BP 133/87 mmHg  Pulse 82  Ht 5\' 7"  (1.702 m)  Wt 183 lb 4.8 oz (83.144 kg)  BMI 28.70 kg/m2  Breastfeeding? Yes  General:  alert, cooperative, appears stated age and mildly obese   Breasts:  inspection negative, no nipple discharge or bleeding, no masses or nodularity palpable  Lungs: clear to auscultation bilaterally  Heart:  regular rate and rhythm, S1, S2 normal, no murmur, click, rub or gallop  Abdomen: soft, non-tender; bowel sounds normal; no masses,  no organomegaly   Vulva:  normal  Vagina: normal vagina, no discharge, exudate, lesion, or erythema  Cervix:  multiparous appearance  Corpus: normal size, contour, position, consistency, mobility, non-tender  Adnexa:  normal adnexa and no mass, fullness, tenderness  Rectal Exam: Not performed.        Assessment:     6 weeks postpartum exam. Pap smear  not done at today's visit.   Plan:    1. Contraception: condoms and oral progesterone-only contraceptive 2. Estrace cream to apply to clitoris nightly x 10 days and prn. Diarrhea secondary to antibiotics- to add acidophilous daily 3. Follow up in: 4 months or as needed.

## 2015-03-10 NOTE — Patient Instructions (Signed)
  Place postpartum visit patient instructions here.  

## 2015-03-11 ENCOUNTER — Other Ambulatory Visit: Payer: Self-pay | Admitting: Obstetrics and Gynecology

## 2015-03-11 DIAGNOSIS — E559 Vitamin D deficiency, unspecified: Secondary | ICD-10-CM

## 2015-03-11 LAB — CBC
Hematocrit: 40.9 % (ref 34.0–46.6)
Hemoglobin: 13.7 g/dL (ref 11.1–15.9)
MCH: 28.2 pg (ref 26.6–33.0)
MCHC: 33.5 g/dL (ref 31.5–35.7)
MCV: 84 fL (ref 79–97)
PLATELETS: 320 10*3/uL (ref 150–379)
RBC: 4.85 x10E6/uL (ref 3.77–5.28)
RDW: 13.8 % (ref 12.3–15.4)
WBC: 6.7 10*3/uL (ref 3.4–10.8)

## 2015-03-11 LAB — IRON: IRON: 50 ug/dL (ref 27–159)

## 2015-03-11 LAB — VITAMIN D 25 HYDROXY (VIT D DEFICIENCY, FRACTURES): VIT D 25 HYDROXY: 17.1 ng/mL — AB (ref 30.0–100.0)

## 2015-03-11 MED ORDER — VITAMIN D (ERGOCALCIFEROL) 1.25 MG (50000 UNIT) PO CAPS: 50000.0000 [IU] | ORAL_CAPSULE | ORAL | Status: DC

## 2015-05-26 ENCOUNTER — Encounter: Payer: Self-pay | Admitting: Obstetrics and Gynecology

## 2015-06-05 ENCOUNTER — Encounter: Payer: Self-pay | Admitting: Obstetrics and Gynecology

## 2015-06-09 ENCOUNTER — Encounter: Payer: Self-pay | Admitting: Obstetrics and Gynecology

## 2015-06-12 ENCOUNTER — Encounter: Payer: Self-pay | Admitting: Obstetrics and Gynecology

## 2015-07-10 ENCOUNTER — Encounter: Payer: Self-pay | Admitting: Obstetrics and Gynecology

## 2015-07-10 ENCOUNTER — Ambulatory Visit (INDEPENDENT_AMBULATORY_CARE_PROVIDER_SITE_OTHER): Payer: 59 | Admitting: Obstetrics and Gynecology

## 2015-07-10 ENCOUNTER — Other Ambulatory Visit: Payer: Self-pay | Admitting: Obstetrics and Gynecology

## 2015-07-10 VITALS — BP 138/84 | HR 76 | Ht 67.0 in | Wt 183.4 lb

## 2015-07-10 DIAGNOSIS — F419 Anxiety disorder, unspecified: Secondary | ICD-10-CM

## 2015-07-10 DIAGNOSIS — Z01419 Encounter for gynecological examination (general) (routine) without abnormal findings: Secondary | ICD-10-CM | POA: Diagnosis not present

## 2015-07-10 DIAGNOSIS — E663 Overweight: Secondary | ICD-10-CM | POA: Diagnosis not present

## 2015-07-10 MED ORDER — NORGESTIMATE-ETH ESTRADIOL 0.25-35 MG-MCG PO TABS: 1.0000 | ORAL_TABLET | Freq: Every day | ORAL | Status: DC

## 2015-07-10 MED ORDER — ALPRAZOLAM 0.5 MG PO TABS: 0.5000 mg | ORAL_TABLET | Freq: Three times a day (TID) | ORAL | Status: DC | PRN

## 2015-07-10 NOTE — Patient Instructions (Addendum)
  Place annual gynecologic exam patient instructions here.   Generalized Anxiety Disorder Generalized anxiety disorder (GAD) is a mental disorder. It interferes with life functions, including relationships, work, and school. GAD is different from normal anxiety, which everyone experiences at some point in their lives in response to specific life events and activities. Normal anxiety actually helps us prepare for and get through these life events and activities. Normal anxiety goes away after the event or activity is over.  GAD causes anxiety that is not necessarily related to specific events or activities. It also causes excess anxiety in proportion to specific events or activities. The anxiety associated with GAD is also difficult to control. GAD can vary from mild to severe. People with severe GAD can have intense waves of anxiety with physical symptoms (panic attacks).  SYMPTOMS The anxiety and worry associated with GAD are difficult to control. This anxiety and worry are related to many life events and activities and also occur more days than not for 6 months or longer. People with GAD also have three or more of the following symptoms (one or more in children):  Restlessness.   Fatigue.  Difficulty concentrating.   Irritability.  Muscle tension.  Difficulty sleeping or unsatisfying sleep. DIAGNOSIS GAD is diagnosed through an assessment by your health care provider. Your health care provider will ask you questions aboutyour mood,physical symptoms, and events in your life. Your health care provider may ask you about your medical history and use of alcohol or drugs, including prescription medicines. Your health care provider may also do a physical exam and blood tests. Certain medical conditions and the use of certain substances can cause symptoms similar to those associated with GAD. Your health care provider may refer you to a mental health specialist for further  evaluation. TREATMENT The following therapies are usually used to treat GAD:   Medication. Antidepressant medication usually is prescribed for long-term daily control. Antianxiety medicines may be added in severe cases, especially when panic attacks occur.   Talk therapy (psychotherapy). Certain types of talk therapy can be helpful in treating GAD by providing support, education, and guidance. A form of talk therapy called cognitive behavioral therapy can teach you healthy ways to think about and react to daily life events and activities.  Stress managementtechniques. These include yoga, meditation, and exercise and can be very helpful when they are practiced regularly. A mental health specialist can help determine which treatment is best for you. Some people see improvement with one therapy. However, other people require a combination of therapies.   This information is not intended to replace advice given to you by your health care provider. Make sure you discuss any questions you have with your health care provider.   Document Released: 04/16/2012 Document Revised: 01/10/2014 Document Reviewed: 04/16/2012 Elsevier Interactive Patient Education [employer]2016 [employer]

## 2015-07-10 NOTE — Progress Notes (Signed)
  Subjective:     Sydney PrinceMichaela James is a 20 y.o. female and is here for a comprehensive physical exam. The patient reports problems - daily anxiety- worse since delivery and moving.  Social History   Social History  . Marital Status: Married    Spouse Name: N/A  . Number of Children: N/A  . Years of Education: N/A   Occupational History  . Not on file.   Social History Main Topics  . Smoking status: Never Smoker   . Smokeless tobacco: Never Used  . Alcohol Use: No  . Drug Use: No  . Sexual Activity: Yes    Birth Control/ Protection: None   Other Topics Concern  . Not on file   Social History Narrative   Health Maintenance  Topic Date Due  . PAP SMEAR  10/30/2014  . CHLAMYDIA SCREENING  07/15/2015  . INFLUENZA VACCINE  08/04/2015  . TETANUS/TDAP  12/01/2024  . HIV Screening  Completed    The following portions of the patient's history were reviewed and updated as appropriate: allergies, current medications, past family history, past medical history, past social history, past surgical history and problem list.  Review of Systems Pertinent items noted in HPI and remainder of comprehensive ROS otherwise negative.   Objective:    General appearance: alert, cooperative and appears stated age Neck: no adenopathy, no carotid bruit, no JVD, supple, symmetrical, trachea midline and thyroid not enlarged, symmetric, no tenderness/mass/nodules Lungs: clear to auscultation bilaterally Breasts: normal appearance, no masses or tenderness Heart: regular rate and rhythm, S1, S2 normal, no murmur, click, rub or gallop Abdomen: soft, non-tender; bowel sounds normal; no masses,  no organomegaly Pelvic: cervix normal in appearance, external genitalia normal, no adnexal masses or tenderness, no cervical motion tenderness, rectovaginal septum normal, uterus normal size, shape, and consistency and vagina normal without discharge    Assessment:    Healthy female exam. Overweight, anxiety  OCP use      Plan:  Changed pills to Sprintec since no longer breast feeding, rx for xanax 0.5mg  tid prn, will let me know if desires SSRI. Discussed weight loss before trying for another pregnancy in near future.  RTC 1 year or PRN  Jaielle Dlouhy, CNM   See After Visit Summary for Counseling Recommendations

## 2015-07-20 LAB — CYTOLOGY - PAP

## 2015-10-12 ENCOUNTER — Encounter: Payer: Self-pay | Admitting: Obstetrics and Gynecology

## 2016-01-27 ENCOUNTER — Encounter: Payer: Self-pay | Admitting: Obstetrics and Gynecology

## 2016-01-27 ENCOUNTER — Ambulatory Visit (INDEPENDENT_AMBULATORY_CARE_PROVIDER_SITE_OTHER): Payer: 59 | Admitting: Obstetrics and Gynecology

## 2016-01-27 VITALS — BP 131/85 | HR 90 | Ht 67.0 in | Wt 185.6 lb

## 2016-01-27 DIAGNOSIS — O219 Vomiting of pregnancy, unspecified: Secondary | ICD-10-CM

## 2016-01-27 DIAGNOSIS — N926 Irregular menstruation, unspecified: Secondary | ICD-10-CM

## 2016-01-27 DIAGNOSIS — E559 Vitamin D deficiency, unspecified: Secondary | ICD-10-CM

## 2016-01-27 LAB — POCT URINE PREGNANCY: PREG TEST UR: POSITIVE — AB

## 2016-01-27 NOTE — Progress Notes (Signed)
Subjective:     Patient ID: Sydney James, female   DOB: 05/30/1995, 21 y.o.   MRN: [id number]  HPI Reports light and short menses on 12/29/15 with + home UPT last week. Nausea x 1 week. Denies any other concerns. [facility]GA 1575w1d, [facility]  Review of Systems Negative except stated in HPI    Objective:   Physical Exam A&Ox4 Well groomed female Blood pressure 131/85, pulse 90, height 5\' 7"  (1.702 m), weight 185 lb 9.6 oz (84.2 kg), last menstrual period 12/25/2015, not currently breastfeeding. +UPT Pelvic exam not indicated    Assessment:     Missed menses H/O PIH with last pregnancy Nausea      Plan:     diclegis samples given and instructed on use. Pregnancy blood levels obtained today RTC in 2 weeks for viability scan and nurse intake/labs  Harlow MaresMelody Ariba James, CNM

## 2016-01-28 ENCOUNTER — Other Ambulatory Visit: Payer: Self-pay | Admitting: Obstetrics and Gynecology

## 2016-01-28 LAB — BETA HCG QUANT (REF LAB): hCG Quant: 2318 m[IU]/mL

## 2016-01-28 LAB — VITAMIN D 25 HYDROXY (VIT D DEFICIENCY, FRACTURES): VIT D 25 HYDROXY: 19.6 ng/mL — AB (ref 30.0–100.0)

## 2016-01-28 MED ORDER — VITAMIN D (ERGOCALCIFEROL) 1.25 MG (50000 UNIT) PO CAPS: 50000.0000 [IU] | ORAL_CAPSULE | ORAL | 2 refills | Status: DC

## 2016-02-12 ENCOUNTER — Other Ambulatory Visit: Payer: Self-pay

## 2016-02-19 ENCOUNTER — Ambulatory Visit (INDEPENDENT_AMBULATORY_CARE_PROVIDER_SITE_OTHER): Payer: Self-pay | Admitting: Obstetrics and Gynecology

## 2016-02-19 ENCOUNTER — Ambulatory Visit (INDEPENDENT_AMBULATORY_CARE_PROVIDER_SITE_OTHER): Payer: Self-pay

## 2016-02-19 VITALS — BP 111/77 | HR 78 | Ht 67.0 in | Wt 183.5 lb

## 2016-02-19 DIAGNOSIS — N926 Irregular menstruation, unspecified: Secondary | ICD-10-CM

## 2016-02-19 DIAGNOSIS — Z113 Encounter for screening for infections with a predominantly sexual mode of transmission: Secondary | ICD-10-CM

## 2016-02-19 DIAGNOSIS — Z1389 Encounter for screening for other disorder: Secondary | ICD-10-CM

## 2016-02-19 DIAGNOSIS — Z3401 Encounter for supervision of normal first pregnancy, first trimester: Secondary | ICD-10-CM

## 2016-02-19 NOTE — Progress Notes (Signed)
Sydney PrinceMichaela James presents for NOB nurse interview visit. Pregnancy confirmation done on 01/27/16 by [facility]. UPT-positive, [id number]. Ultrasound today resulted in EGA: 7.6wks, EDD: 10/01/2016.  G-2. P-1101 Pregnancy education material explained and given. No cats in the home. NOB labs ordered.  HIV labs and Drug screen were explained optional and she did not decline. Drug screen ordered. PNV encouraged. Genetic screening options discussed. Genetic testing: will do MaterniT.  Pt will have with NOB appt with provider. Pt has continued to stomach pains and having to go to bathroom, like when she had her first child. Take probiotic and this does help with pain but continues to go to bathroom. To discuss with provider at visit. Continues to c/o n/v and Diclegis helped but is out. Will get Vitamin B6 and Unisom OTC, but if this does not do as well as Diclegis will get prescription. Pt. To follow up with provider in 3-4 weeks for NOB physical.  All questions answered.

## 2016-02-19 NOTE — Patient Instructions (Signed)
Pregnancy and Zika Virus Disease Introduction Zika virus disease, or Zika, is an illness that can spread to people from mosquitoes that carry the virus. It may also spread from person to person through infected body fluids. Zika first occurred in [place], but recently it has spread to new areas. The virus occurs in tropical climates. The location of Zika continues to change. Most people who become infected with Zika virus do not develop serious illness. However, Zika may cause birth defects in an unborn baby whose mother is infected with the virus. It may also increase the risk of miscarriage. What are the symptoms of Zika virus disease? In many cases, people who have been infected with Zika virus do not develop any symptoms. If symptoms appear, they usually start about a week after the person is infected. Symptoms are usually mild. They may include:  Fever.  Rash.  Red eyes.  Joint pain. How does Zika virus disease spread? The main way that Zika virus spreads is through the bite of a certain type of mosquito. Unlike most types of mosquitos, which bite only at night, the type of mosquito that carries Zika virus bites both at night and during the day. Zika virus can also spread through sexual contact, through a blood transfusion, and from a mother to her baby before or during birth. Once you have had Zika virus disease, it is unlikely that you will get it again. Can I pass Zika to my baby during pregnancy? Yes, Zika can pass from a mother to her baby before or during birth. What problems can Zika cause for my baby? A woman who is infected with Zika virus while pregnant is at risk of having her baby born with a condition in which the brain or head is smaller than expected (microcephaly). Babies who have microcephaly can have developmental delays, seizures, hearing problems, and vision problems. Having Zika virus disease during pregnancy can also increase the risk of miscarriage. How can Zika  virus disease be prevented? There is no vaccine to prevent Zika. The best way to prevent the disease is to avoid infected mosquitoes and avoid exposure to body fluids that can spread the virus. Avoid any possible exposure to Zika by taking the following precautions. For women and their sex partners:  Avoid traveling to high-risk areas. The locations where Zika is being reported change often. To identify high-risk areas, check the CDC travel website: www.cdc.gov/zika/geo/index.html  If you or your sex partner must travel to a high-risk area, talk with a health care provider before and after traveling.  Take all precautions to avoid mosquito bites if you live in, or travel to, any of the high-risk areas. Insect repellents are safe to use during pregnancy.  Ask your health care provider when it is safe to have sexual contact. For women:  If you are pregnant or trying to become pregnant, avoid sexual contact with persons who may have been exposed to Zika virus, persons who have possible symptoms of Zika, or persons whose history you are unsure about. If you choose to have sexual contact with someone who may have been exposed to Zika virus, use condoms correctly during the entire duration of sexual activity, every time. Do not share sexual devices, as you may be exposed to body fluids.  Ask your health care provider about when it is safe to attempt pregnancy after a possible exposure to Zika virus. What steps should I take to avoid mosquito bites? Take these steps to avoid mosquito bites when you   are in a high-risk area:  Wear loose clothing that covers your arms and legs.  Limit your outdoor activities.  Do not open windows unless they have window screens.  Sleep under mosquito nets.  Use insect repellent. The best insect repellents have:  DEET, picaridin, oil of lemon eucalyptus (OLE), or IR3535 in them.  Higher amounts of an active ingredient in them.  Remember that insect repellents  are safe to use during pregnancy.  Do not use OLE on children who are younger than 3 years of age. Do not use insect repellent on babies who are younger than 2 months of age.  Cover your child's stroller with mosquito netting. Make sure the netting fits snugly and that any loose netting does not cover your child's mouth or nose. Do not use a blanket as a mosquito-protection cover.  Do not apply insect repellent underneath clothing.  If you are using sunscreen, apply the sunscreen before applying the insect repellent.  Treat clothing with permethrin. Do not apply permethrin directly to your skin. Follow label directions for safe use.  Get rid of standing water, where mosquitoes may reproduce. Standing water is often found in items such as buckets, bowls, animal food dishes, and flowerpots. When you return from traveling to any high-risk area, continue taking actions to protect yourself against mosquito bites for 3 weeks, even if you show no signs of illness. This will prevent spreading Zika virus to uninfected mosquitoes. What should I know about the sexual transmission of Zika? People can spread Zika to their sexual partners during vaginal, anal, or oral sex, or by sharing sexual devices. Many people with Zika do not develop symptoms, so a person could spread the disease without knowing that they are infected. The greatest risk is to women who are pregnant or who may become pregnant. Zika virus can live longer in semen than it can live in blood. Couples can prevent sexual transmission of the virus by:  Using condoms correctly during the entire duration of sexual activity, every time. This includes vaginal, anal, and oral sex.  Not sharing sexual devices. Sharing increases your risk of being exposed to body fluid from another person.  Avoiding all sexual activity until your health care provider says it is safe. Should I be tested for Zika virus? A sample of your blood can be tested for Zika  virus. A pregnant woman should be tested if she may have been exposed to the virus or if she has symptoms of Zika. She may also have additional tests done during her pregnancy, such ultrasound testing. Talk with your health care provider about which tests are recommended. This information is not intended to replace advice given to you by your health care provider. Make sure you discuss any questions you have with your health care provider. Document Released: 09/10/2014 Document Revised: 05/28/2015 Document Reviewed: 09/03/2014  2017 Elsevier Minor Illnesses and Medications in Pregnancy  Cold/Flu:  Sudafed for congestion- Robitussin (plain) for cough- Tylenol for discomfort.  Please follow the directions on the label.  Try not to take any more than needed.  OTC Saline nasal spray and air humidifier or cool-mist  Vaporizer to sooth nasal irritation and to loosen congestion.  It is also important to increase intake of non carbonated fluids, especially if you have a fever.  Constipation:  Colace-2 capsules at bedtime; Metamucil- follow directions on label; Senokot- 1 tablet at bedtime.  Any one of these medications can be used.  It is also very important to increase   fluids and fruits along with regular exercise.  If problem persists please call the office.  Diarrhea:  Kaopectate as directed on the label.  Eat a bland diet and increase fluids.  Avoid highly seasoned foods.  Headache:  Tylenol 1 or 2 tablets every 3-4 hours as needed  Indigestion:  Maalox, Mylanta, Tums or Rolaids- as directed on label.  Also try to eat small meals and avoid fatty, greasy or spicy foods.  Nausea with or without Vomiting:  Nausea in pregnancy is caused by increased levels of hormones in the body which influence the digestive system and cause irritation when stomach acids accumulate.  Symptoms usually subside after 1st trimester of pregnancy.  Try the following: 1. Keep saltines, graham crackers or dry toast by your bed to  eat upon awakening. 2. Don't let your stomach get empty.  Try to eat 5-6 small meals per day instead of 3 large ones. 3. Avoid greasy fatty or highly seasoned foods.  4. Take OTC Unisom 1 tablet at bed time along with OTC Vitamin B6 25-50 mg 3 times per day.    If nausea continues with vomiting and you are unable to keep down food and fluids you may need a prescription medication.  Please notify your provider.   Sore throat:  Chloraseptic spray, throat lozenges and or plain Tylenol.  Vaginal Yeast Infection:  OTC Monistat for 7 days as directed on label.  If symptoms do not resolve within a week notify provider.  If any of the above problems do not subside with recommended treatment please call the office for further assistance.   Do not take Aspirin, Advil, Motrin or Ibuprofen.  * * OTC= Over the counter Hyperemesis Gravidarum Hyperemesis gravidarum is a severe form of nausea and vomiting that happens during pregnancy. Hyperemesis is worse than morning sickness. It may cause you to have nausea or vomiting all day for many days. It may keep you from eating and drinking enough food and liquids. Hyperemesis usually occurs during the first half (the first 20 weeks) of pregnancy. It often goes away once a woman is in her second half of pregnancy. However, sometimes hyperemesis continues through an entire pregnancy. What are the causes? The cause of this condition is not known. It may be related to changes in chemicals (hormones) in the body during pregnancy, such as the high level of pregnancy hormone (human chorionic gonadotropin) or the increase in the female sex hormone (estrogen). What are the signs or symptoms? Symptoms of this condition include:  Severe nausea and vomiting.  Nausea that does not go away.  Vomiting that does not allow you to keep any food down.  Weight loss.  Body fluid loss (dehydration).  Having no desire to eat, or not liking food that you have previously  enjoyed. How is this diagnosed? This condition may be diagnosed based on:  A physical exam.  Your medical history.  Your symptoms.  Blood tests.  Urine tests. How is this treated? This condition may be managed with medicine. If medicines to do not help relieve nausea and vomiting, you may need to receive fluids through an IV tube at the hospital. Follow these instructions at home:  Take over-the-counter and prescription medicines only as told by your health care provider.  Avoid iron pills and multivitamins that contain iron for the first 3-4 months of pregnancy. If you take prescription iron pills, do not stop taking them unless your health care provider approves.  Take the following actions to help   prevent nausea and vomiting:  In the morning, before getting out of bed, try eating a couple of dry crackers or a piece of toast.  Avoid foods and smells that upset your stomach. Fatty and spicy foods may make nausea worse.  Eat 5-6 small meals a day.  Do not drink fluids while eating meals. Drink between meals.  Eat or suck on things that have ginger in them. Ginger can help relieve nausea.  Avoid food preparation. The smell of food can spoil your appetite or trigger nausea.  Follow instructions from your health care provider about eating or drinking restrictions.  For snacks, eat high-protein foods, such as cheese.  Keep all follow-up and pre-birth (prenatal) visits as told by your health care provider. This is important. Contact a health care provider if:  You have pain in your abdomen.  You have a severe headache.  You have vision problems.  You are losing weight. Get help right away if:  You cannot drink fluids without vomiting.  You vomit blood.  You have constant nausea and vomiting.  You are very weak.  You are very thirsty.  You feel dizzy.  You faint.  You have a fever or other symptoms that last for more than 2-3 days.  You have a fever and  your symptoms suddenly get worse. Summary  Hyperemesis gravidarum is a severe form of nausea and vomiting that happens during pregnancy.  Making some changes to your eating habits may help relieve nausea and vomiting.  This condition may be managed with medicine.  If medicines to do not help relieve nausea and vomiting, you may need to receive fluids through an IV tube at the hospital. This information is not intended to replace advice given to you by your health care provider. Make sure you discuss any questions you have with your health care provider. Document Released: 12/20/2004 Document Revised: 08/19/2015 Document Reviewed: 08/19/2015 Elsevier Interactive Patient Education  2017 [employer] Commonly Asked Questions During Pregnancy  Cats: A parasite can be excreted in cat feces.  To avoid exposure you need to have another person empty the little box.  If you must empty the litter box you will need to wear gloves.  Wash your hands after handling your cat.  This parasite can also be found in raw or undercooked meat so this should also be avoided.  Colds, Sore Throats, Flu: Please check your medication sheet to see what you can take for symptoms.  If your symptoms are unrelieved by these medications please call the office.  Dental Work: Most any dental work your dentist recommends is permitted.  X-rays should only be taken during the first trimester if absolutely necessary.  Your abdomen should be shielded with a lead apron during all x-rays.  Please notify your provider prior to receiving any x-rays.  Novocaine is fine; gas is not recommended.  If your dentist requires a note from us prior to dental work please call the office and we will provide one for you.  Exercise: Exercise is an important part of staying healthy during your pregnancy.  You may continue most exercises you were accustomed to prior to pregnancy.  Later in your pregnancy you will most likely notice you have difficulty  with activities requiring balance like riding a bicycle.  It is important that you listen to your body and avoid activities that put you at a higher risk of falling.  Adequate rest and staying well hydrated are a must!  If you have questions   about the safety of specific activities ask your provider.    Exposure to Children with illness: Try to avoid obvious exposure; report any symptoms to us when noted,  If you have chicken pos, red measles or mumps, you should be immune to these diseases.   Please do not take any vaccines while pregnant unless you have checked with your OB provider.  Fetal Movement: After 28 weeks we recommend you do "kick counts" twice daily.  Lie or sit down in a calm quiet environment and count your baby movements "kicks".  You should feel your baby at least 10 times per hour.  If you have not felt 10 kicks within the first hour get up, walk around and have something sweet to eat or drink then repeat for an additional hour.  If count remains less than 10 per hour notify your provider.  Fumigating: Follow your pest control agent's advice as to how long to stay out of your home.  Ventilate the area well before re-entering.  Hemorrhoids:   Most over-the-counter preparations can be used during pregnancy.  Check your medication to see what is safe to use.  It is important to use a stool softener or fiber in your diet and to drink lots of liquids.  If hemorrhoids seem to be getting worse please call the office.   Hot Tubs:  Hot tubs Jacuzzis and saunas are not recommended while pregnant.  These increase your internal body temperature and should be avoided.  Intercourse:  Sexual intercourse is safe during pregnancy as long as you are comfortable, unless otherwise advised by your provider.  Spotting may occur after intercourse; report any bright red bleeding that is heavier than spotting.  Labor:  If you know that you are in labor, please go to the hospital.  If you are unsure, please  call the office and let us help you decide what to do.  Lifting, straining, etc:  If your job requires heavy lifting or straining please check with your provider for any limitations.  Generally, you should not lift items heavier than that you can lift simply with your hands and arms (no back muscles)  Painting:  Paint fumes do not harm your pregnancy, but may make you ill and should be avoided if possible.  Latex or water based paints have less odor than oils.  Use adequate ventilation while painting.  Permanents & Hair Color:  Chemicals in hair dyes are not recommended as they cause increase hair dryness which can increase hair loss during pregnancy.  " Highlighting" and permanents are allowed.  Dye may be absorbed differently and permanents may not hold as well during pregnancy.  Sunbathing:  Use a sunscreen, as skin burns easily during pregnancy.  Drink plenty of fluids; avoid over heating.  Tanning Beds:  Because their possible side effects are still unknown, tanning beds are not recommended.  Ultrasound Scans:  Routine ultrasounds are performed at approximately 20 weeks.  You will be able to see your baby's general anatomy an if you would like to know the gender this can usually be determined as well.  If it is questionable when you conceived you may also receive an ultrasound early in your pregnancy for dating purposes.  Otherwise ultrasound exams are not routinely performed unless there is a medical necessity.  Although you can request a scan we ask that you pay for it when conducted because insurance does not cover " patient request" scans.  Work: If your pregnancy proceeds without complications you   may work until your due date, unless your physician or employer advises otherwise.  Round Ligament Pain/Pelvic Discomfort:  Sharp, shooting pains not associated with bleeding are fairly common, usually occurring in the second trimester of pregnancy.  They tend to be worse when standing up or when  you remain standing for long periods of time.  These are the result of pressure of certain pelvic ligaments called "round ligaments".  Rest, Tylenol and heat seem to be the most effective relief.  As the womb and fetus grow, they rise out of the pelvis and the discomfort improves.  Please notify the office if your pain seems different than that described.  It may represent a more serious condition.   

## 2016-02-20 LAB — CBC WITH DIFFERENTIAL/PLATELET
BASOS ABS: 0 10*3/uL (ref 0.0–0.2)
Basos: 0 %
EOS (ABSOLUTE): 0.1 10*3/uL (ref 0.0–0.4)
Eos: 1 %
HEMOGLOBIN: 13.5 g/dL (ref 11.1–15.9)
Hematocrit: 40 % (ref 34.0–46.6)
Immature Grans (Abs): 0 10*3/uL (ref 0.0–0.1)
Immature Granulocytes: 0 %
Lymphocytes Absolute: 2.4 10*3/uL (ref 0.7–3.1)
Lymphs: 26 %
MCH: 29.2 pg (ref 26.6–33.0)
MCHC: 33.8 g/dL (ref 31.5–35.7)
MCV: 87 fL (ref 79–97)
MONOCYTES: 6 %
Monocytes Absolute: 0.6 10*3/uL (ref 0.1–0.9)
NEUTROS ABS: 6.2 10*3/uL (ref 1.4–7.0)
Neutrophils: 67 %
Platelets: 335 10*3/uL (ref 150–379)
RBC: 4.62 x10E6/uL (ref 3.77–5.28)
RDW: 13.4 % (ref 12.3–15.4)
WBC: 9.3 10*3/uL (ref 3.4–10.8)

## 2016-02-20 LAB — ANTIBODY SCREEN: ANTIBODY SCREEN: NEGATIVE

## 2016-02-20 LAB — RPR: RPR Ser Ql: NONREACTIVE

## 2016-02-20 LAB — RH TYPE: RH TYPE: POSITIVE

## 2016-02-20 LAB — RUBELLA SCREEN: Rubella Antibodies, IGG: 1.99 index (ref >0.99)

## 2016-02-20 LAB — HEPATITIS B SURFACE ANTIGEN: HEP B S AG: NEGATIVE

## 2016-02-20 LAB — HIV ANTIBODY (ROUTINE TESTING W REFLEX): HIV Screen 4th Generation wRfx: NONREACTIVE

## 2016-02-20 LAB — VARICELLA ZOSTER ANTIBODY, IGG: VARICELLA: 1179 {index} (ref >165)

## 2016-02-20 LAB — ABO

## 2016-02-21 LAB — MONITOR DRUG PROFILE 14(MW)
Amphetamine Scrn, Ur: NEGATIVE ng/mL
BARBITURATE SCREEN URINE: NEGATIVE ng/mL
BENZODIAZEPINE SCREEN, URINE: NEGATIVE ng/mL
BUPRENORPHINE, URINE: NEGATIVE ng/mL
CANNABINOIDS UR QL SCN: NEGATIVE ng/mL
COCAINE(METAB.)SCREEN, URINE: NEGATIVE ng/mL
CREATININE(CRT), U: 27.1 mg/dL (ref 20.0–300.0)
Fentanyl, Urine: NEGATIVE pg/mL
MEPERIDINE SCREEN, URINE: NEGATIVE ng/mL
METHADONE SCREEN, URINE: NEGATIVE ng/mL
OPIATE SCREEN URINE: NEGATIVE ng/mL
OXYCODONE+OXYMORPHONE UR QL SCN: NEGATIVE ng/mL
PHENCYCLIDINE QUANTITATIVE URINE: NEGATIVE ng/mL
Ph of Urine: 6.1 (ref 4.5–8.9)
Propoxyphene Scrn, Ur: NEGATIVE ng/mL
SPECIFIC GRAVITY: 1.005
Tramadol Screen, Urine: NEGATIVE ng/mL

## 2016-02-21 LAB — URINALYSIS, ROUTINE W REFLEX MICROSCOPIC
BILIRUBIN UA: NEGATIVE
Glucose, UA: NEGATIVE
Ketones, UA: NEGATIVE
Nitrite, UA: NEGATIVE
PH UA: 6.5 (ref 5.0–7.5)
Protein, UA: NEGATIVE
RBC UA: NEGATIVE
Specific Gravity, UA: 1.006 (ref 1.005–1.030)
UUROB: 0.2 mg/dL (ref 0.2–1.0)

## 2016-02-21 LAB — MICROSCOPIC EXAMINATION: Casts: NONE SEEN /lpf

## 2016-02-21 LAB — NICOTINE SCREEN, URINE: Cotinine Ql Scrn, Ur: NEGATIVE ng/mL

## 2016-02-22 LAB — GC/CHLAMYDIA PROBE AMP
Chlamydia trachomatis, NAA: NEGATIVE
NEISSERIA GONORRHOEAE BY PCR: NEGATIVE

## 2016-02-24 ENCOUNTER — Other Ambulatory Visit: Payer: Self-pay | Admitting: Obstetrics and Gynecology

## 2016-02-24 DIAGNOSIS — R8271 Bacteriuria: Secondary | ICD-10-CM

## 2016-02-24 MED ORDER — FLUCONAZOLE 100 MG PO TABS: 100.0000 mg | ORAL_TABLET | Freq: Every day | ORAL | 0 refills | Status: DC

## 2016-02-26 ENCOUNTER — Other Ambulatory Visit: Payer: Self-pay | Admitting: Obstetrics and Gynecology

## 2016-02-26 LAB — URINE CULTURE, OB REFLEX

## 2016-02-26 LAB — CULTURE, OB URINE

## 2016-03-04 ENCOUNTER — Encounter: Payer: Self-pay | Admitting: Obstetrics and Gynecology

## 2016-03-04 ENCOUNTER — Other Ambulatory Visit: Payer: Self-pay | Admitting: *Deleted

## 2016-03-04 MED ORDER — DOXYLAMINE-PYRIDOXINE 10-10 MG PO TBEC: 1.0000 | DELAYED_RELEASE_TABLET | Freq: Every day | ORAL | 2 refills | Status: DC

## 2016-03-11 ENCOUNTER — Encounter: Payer: Self-pay | Admitting: Obstetrics and Gynecology

## 2016-03-14 ENCOUNTER — Encounter: Payer: Self-pay | Admitting: Obstetrics and Gynecology

## 2016-03-15 ENCOUNTER — Ambulatory Visit: Admitting: Anesthesiology

## 2016-03-15 ENCOUNTER — Encounter: Payer: Self-pay | Admitting: Obstetrics and Gynecology

## 2016-03-15 ENCOUNTER — Encounter: Payer: Self-pay | Admitting: *Deleted

## 2016-03-15 ENCOUNTER — Other Ambulatory Visit (INDEPENDENT_AMBULATORY_CARE_PROVIDER_SITE_OTHER)

## 2016-03-15 ENCOUNTER — Encounter
Admission: RE | Disposition: A | Discharge status: Home / Self Care | Payer: Self-pay | Source: Ambulatory Visit | Attending: Obstetrics and Gynecology

## 2016-03-15 ENCOUNTER — Ambulatory Visit
Admission: RE | Admit: 2016-03-15 | Discharge: 2016-03-15 | Disposition: A | Discharge status: Home / Self Care | Source: Ambulatory Visit | Attending: Obstetrics and Gynecology | Admitting: Obstetrics and Gynecology

## 2016-03-15 ENCOUNTER — Ambulatory Visit (INDEPENDENT_AMBULATORY_CARE_PROVIDER_SITE_OTHER): Admitting: Obstetrics and Gynecology

## 2016-03-15 VITALS — BP 138/82 | HR 77 | Wt 184.9 lb

## 2016-03-15 DIAGNOSIS — Z3492 Encounter for supervision of normal pregnancy, unspecified, second trimester: Secondary | ICD-10-CM | POA: Diagnosis not present

## 2016-03-15 DIAGNOSIS — R8271 Bacteriuria: Secondary | ICD-10-CM

## 2016-03-15 DIAGNOSIS — O021 Missed abortion: Secondary | ICD-10-CM | POA: Diagnosis not present

## 2016-03-15 DIAGNOSIS — O209 Hemorrhage in early pregnancy, unspecified: Secondary | ICD-10-CM | POA: Diagnosis not present

## 2016-03-15 DIAGNOSIS — Z9889 Other specified postprocedural states: Secondary | ICD-10-CM

## 2016-03-15 HISTORY — PX: DILATION AND EVACUATION: SHX1459

## 2016-03-15 LAB — CBC WITH DIFFERENTIAL/PLATELET
Basophils Absolute: 0 10*3/uL (ref 0–0.1)
Basophils Relative: 0 %
Eosinophils Absolute: 0.2 10*3/uL (ref 0–0.7)
Eosinophils Relative: 2 %
HCT: 40.6 % (ref 35.0–47.0)
HEMOGLOBIN: 13.7 g/dL (ref 12.0–16.0)
LYMPHS ABS: 2.5 10*3/uL (ref 1.0–3.6)
LYMPHS PCT: 30 %
MCH: 29.8 pg (ref 26.0–34.0)
MCHC: 33.8 g/dL (ref 32.0–36.0)
MCV: 88 fL (ref 80.0–100.0)
MONOS PCT: 6 %
Monocytes Absolute: 0.5 10*3/uL (ref 0.2–0.9)
NEUTROS PCT: 62 %
Neutro Abs: 5.1 10*3/uL (ref 1.4–6.5)
Platelets: 275 10*3/uL (ref 150–440)
RBC: 4.61 MIL/uL (ref 3.80–5.20)
RDW: 13.4 % (ref 11.5–14.5)
WBC: 8.3 10*3/uL (ref 3.6–11.0)

## 2016-03-15 LAB — RAPID HIV SCREEN (HIV 1/2 AB+AG)
HIV 1/2 Antibodies: NONREACTIVE
HIV-1 P24 Antigen - HIV24: NONREACTIVE

## 2016-03-15 LAB — TYPE AND SCREEN
ABO/RH(D): O POS
ANTIBODY SCREEN: NEGATIVE

## 2016-03-15 LAB — POCT URINALYSIS DIPSTICK
Bilirubin, UA: NEGATIVE
Glucose, UA: NEGATIVE
KETONES UA: NEGATIVE
Nitrite, UA: NEGATIVE
PH UA: 6
Spec Grav, UA: 1.01
Urobilinogen, UA: 0.2

## 2016-03-15 SURGERY — DILATION AND EVACUATION, UTERUS
Anesthesia: General | Site: Vagina | Wound class: Clean Contaminated

## 2016-03-15 MED ORDER — MIDAZOLAM HCL 5 MG/5ML IJ SOLN
INTRAMUSCULAR | Status: DC | PRN
  Administered 2016-03-15: 2 mg via INTRAVENOUS

## 2016-03-15 MED ORDER — ONDANSETRON HCL 4 MG/2ML IJ SOLN: 4.0000 mg | Freq: Once | INTRAMUSCULAR | Status: DC | PRN

## 2016-03-15 MED ORDER — ONDANSETRON HCL 4 MG/2ML IJ SOLN
INTRAMUSCULAR | Status: DC | PRN
  Administered 2016-03-15: 4 mg via INTRAVENOUS

## 2016-03-15 MED ORDER — MIDAZOLAM HCL 2 MG/2ML IJ SOLN
INTRAMUSCULAR | Status: AC
  Filled 2016-03-15: qty 2

## 2016-03-15 MED ORDER — PROPOFOL 10 MG/ML IV BOLUS
INTRAVENOUS | Status: DC | PRN
  Administered 2016-03-15: 180 mg via INTRAVENOUS

## 2016-03-15 MED ORDER — ONDANSETRON HCL 4 MG/2ML IJ SOLN
INTRAMUSCULAR | Status: AC
  Filled 2016-03-15: qty 2

## 2016-03-15 MED ORDER — LIDOCAINE HCL (PF) 2 % IJ SOLN
INTRAMUSCULAR | Status: AC
  Filled 2016-03-15: qty 2

## 2016-03-15 MED ORDER — SEVOFLURANE IN SOLN
RESPIRATORY_TRACT | Status: AC
  Filled 2016-03-15: qty 250

## 2016-03-15 MED ORDER — KETOROLAC TROMETHAMINE 30 MG/ML IJ SOLN
INTRAMUSCULAR | Status: AC
  Filled 2016-03-15: qty 1

## 2016-03-15 MED ORDER — DEXAMETHASONE SODIUM PHOSPHATE 10 MG/ML IJ SOLN
INTRAMUSCULAR | Status: DC | PRN
  Administered 2016-03-15: 5 mg via INTRAVENOUS

## 2016-03-15 MED ORDER — OXYCODONE-ACETAMINOPHEN 5-325 MG PO TABS: 1.0000 | ORAL_TABLET | ORAL | 0 refills | Status: DC | PRN

## 2016-03-15 MED ORDER — FENTANYL CITRATE (PF) 100 MCG/2ML IJ SOLN
INTRAMUSCULAR | Status: DC | PRN
  Administered 2016-03-15 (×2): 25 ug via INTRAVENOUS
  Administered 2016-03-15: 50 ug via INTRAVENOUS

## 2016-03-15 MED ORDER — FENTANYL CITRATE (PF) 100 MCG/2ML IJ SOLN
INTRAMUSCULAR | Status: AC
  Filled 2016-03-15: qty 2

## 2016-03-15 MED ORDER — IBUPROFEN 800 MG PO TABS: 800.0000 mg | ORAL_TABLET | Freq: Three times a day (TID) | ORAL | 1 refills | Status: DC

## 2016-03-15 MED ORDER — DEXAMETHASONE SODIUM PHOSPHATE 10 MG/ML IJ SOLN
INTRAMUSCULAR | Status: AC
  Filled 2016-03-15: qty 1

## 2016-03-15 MED ORDER — LIDOCAINE HCL (CARDIAC) 20 MG/ML IV SOLN
INTRAVENOUS | Status: DC | PRN
  Administered 2016-03-15: 60 mg via INTRAVENOUS

## 2016-03-15 MED ORDER — LACTATED RINGERS IV SOLN
INTRAVENOUS | Status: DC
  Administered 2016-03-15: 14:00:00 via INTRAVENOUS

## 2016-03-15 MED ORDER — KETOROLAC TROMETHAMINE 30 MG/ML IJ SOLN
INTRAMUSCULAR | Status: DC | PRN
  Administered 2016-03-15: 30 mg via INTRAVENOUS

## 2016-03-15 MED ORDER — PROPOFOL 10 MG/ML IV BOLUS
INTRAVENOUS | Status: AC
  Filled 2016-03-15: qty 20

## 2016-03-15 MED ORDER — FENTANYL CITRATE (PF) 100 MCG/2ML IJ SOLN: 25.0000 ug | INTRAMUSCULAR | Status: DC | PRN

## 2016-03-15 SURGICAL SUPPLY — 19 items
CATH ROBINSON RED A/P 16FR (CATHETERS) ×2 IMPLANT
DRSG TELFA 3X8 NADH (GAUZE/BANDAGES/DRESSINGS) ×2 IMPLANT
GLOVE BIO SURGEON STRL SZ8 (GLOVE) ×2 IMPLANT
GOWN STRL REUS W/ TWL LRG LVL3 (GOWN DISPOSABLE) ×1 IMPLANT
GOWN STRL REUS W/ TWL XL LVL3 (GOWN DISPOSABLE) ×1 IMPLANT
GOWN STRL REUS W/TWL LRG LVL3 (GOWN DISPOSABLE) ×1
GOWN STRL REUS W/TWL XL LVL3 (GOWN DISPOSABLE) ×1
KIT BERKELEY 1ST TRIMESTER 3/8 (MISCELLANEOUS) ×2 IMPLANT
KIT RM TURNOVER CYSTO AR (KITS) ×2 IMPLANT
PACK DNC HYST (MISCELLANEOUS) ×2 IMPLANT
PAD OB MATERNITY 4.3X12.25 (PERSONAL CARE ITEMS) ×2 IMPLANT
PAD PREP 24X41 OB/GYN DISP (PERSONAL CARE ITEMS) ×2 IMPLANT
SET BERKELEY SUCTION TUBING (SUCTIONS) ×2 IMPLANT
SOL PREP PVP 2OZ (MISCELLANEOUS) ×2
SOLUTION PREP PVP 2OZ (MISCELLANEOUS) ×1 IMPLANT
SPONGE XRAY 4X4 16PLY STRL (MISCELLANEOUS) ×2 IMPLANT
TOWEL OR 17X26 4PK STRL BLUE (TOWEL DISPOSABLE) ×2 IMPLANT
VACURETTE 10 RIGID CVD (CANNULA) ×2 IMPLANT
VACURETTE 8 RIGID CVD (CANNULA) IMPLANT

## 2016-03-15 NOTE — Anesthesia Procedure Notes (Signed)
Procedure Name: LMA Insertion Date/Time: 03/15/2016 3:02 PM Performed by: Lily KocherPERALTA, Sydney Fredericks Pre-anesthesia Checklist: Patient identified, Patient being monitored, Timeout performed, Emergency Drugs available and Suction available Patient Re-evaluated:Patient Re-evaluated prior to inductionOxygen Delivery Method: Circle system utilized Preoxygenation: Pre-oxygenation with 100% oxygen Intubation Type: IV induction Ventilation: Mask ventilation without difficulty LMA: LMA inserted LMA Size: 4.0 Tube type: Oral Number of attempts: 1 Placement Confirmation: positive ETCO2 and breath sounds checked- equal and bilateral Tube secured with: Tape Dental Injury: Teeth and Oropharynx as per pre-operative assessment

## 2016-03-15 NOTE — H&P (Signed)
Subjective:   PREOPERATIVE HISTORY AND PHYSICAL   Date of surgery: 03/15/2016 Diagnosis: Missed AB Procedure: Suction D&C    Patient is a 21 y.o. [id number] scheduled for suction D&C for management of missed abortion. During her new OB history and physical appointment today patient was noted to have an intrauterine fetal demise with ultrasound demonstrating crown-rump length of 9 weeks with fetus showing no fetal cardiac activity patient has had mild spotting and cramping without passage of tissue.   OB History    Gravida Para Term Preterm AB Living   2 1   1    0   SAB TAB Ectopic Multiple Live Births         0       stop Patient's last menstrual period was 12/25/2015 (approximate).    Past Medical History:  Diagnosis Date  . Migraine   . Preeclampsia   . Scoliosis    Back brace for 2 years  . Scoliosis     No past surgical history on file.  OB History  Gravida Para Term Preterm AB Living  2 1   1    0  SAB TAB Ectopic Multiple Live Births        0      # Outcome Date GA Lbr Len/2nd Weight Sex Delivery Anes PTL Lv  2 Current           1 Preterm 01/25/15 [redacted]w[redacted]d 03:30 / 01:45 6 lb 4.2 oz (2.84 kg) M Vag-Spont EPI        Social History   Social History  . Marital status: Married    Spouse name: N/A  . Number of children: N/A  . Years of education: N/A   Social History Main Topics  . Smoking status: Never Smoker  . Smokeless tobacco: Never Used  . Alcohol use No  . Drug use: No  . Sexual activity: Yes    Birth control/ protection: None   Other Topics Concern  . Not on file   Social History Narrative  . No narrative on file    Family History  Problem Relation Age of Onset  . Hypertension Father   . Hypertension Mother      (Not in a hospital admission)  Allergies  Allergen Reactions  . Aloe Other (See Comments)    Unknown reaction  . Lubricants     Has to have water based lubricant  . Tape Itching and Rash    Review of  Systems Constitutional: No recent fever/chills/sweats Respiratory: No recent cough/bronchitis Cardiovascular: No chest pain Gastrointestinal: No recent nausea/vomiting/diarrhea Genitourinary: No UTI symptoms Hematologic/lymphatic:No history of coagulopathy or recent blood thinner use    Objective:    BP 138/82   Pulse 77   Wt 184 lb 14.4 oz (83.9 kg)   LMP 12/25/2015 (Approximate)   BMI 28.96 kg/m   General:   Normal  Skin:   normal  HEENT:  Normal  Neck:  Supple without Adenopathy or Thyromegaly  Lungs:   Heart:              Breasts:   Abdomen:  Pelvis:  M/S   Extremeties:  Neuro:    clear to auscultation bilaterally   Normal without murmur   Not Examined   soft, non-tender; bowel sounds normal; no masses,  no organomegaly   Exam deferred to OR  No CVAT  Warm/Dry   Normal         Assessment:    Missed AB  Plan:  Suction  D&C  Preoperative counseling: Patient is to undergo suction D&C for missed AB. She is understanding of the planned procedure and is aware of and is accepting of all surgical risks which include but are not limited to bleeding, infection, pelvic organ injury with need for repair, uterine perforation, anesthesia risks, blood clot disorders, etc. All questions have been answered. Informed consent is given. Patient is ready and willing to proceed with surgery as scheduled.  Herold HarmsMartin A Melvern Ramone, MD  Note: This dictation was prepared with Dragon dictation along with smaller phrase technology. Any transcriptional errors that result from this process are unintentional.

## 2016-03-15 NOTE — Op Note (Signed)
Sydney PrinceMichaela James PROCEDURE DATE: 03/15/2016 3:38 PM  PREOPERATIVE DIAGNOSIS: MISSED AB POSTOPERATIVE DIAGNOSIS: MISSED AB PROCEDURE: Procedure(s): DILATATION AND EVACUATION (N/A) SURGEON:  Dr. Daphine DeutscherMartin A Orvell Careaga ASSISTANT: PA-S Frances FurbishKaren Siege ANESTHESIA: General  INDICATIONS: 21 y.o. G2P0100 with history ofMissed AB, Fetal Demise and Anembryonic Gestation" . at 11.[redacted] weeks gestation presents for surgical management.   Please see preoperative notes for further details.   FINDINGS:  Uterus wasAnterior and 10 week size. Tissue consistent with products of conception was removed and sent to Pathology.   I/O's: Total I/O In: 700 [I.V.:700] Out: 200 [Urine:100; Blood:100] SPECIMENS: POC  (1/2 to Pathology; 1/2 to Anora COMPLICATIONS: None immediate COUNTS:  YES  PROCEDURE IN DETAIL: The patient was brought to the operating room where she was placed into the supine position.  General LMA anesthesia was induced without incident. She was placed in the dorsal lithotomy position using candy cane stirrups, and was examined; A Betadine prep and drape was performed in sterile fashion.   A  Red Robinson catheter was used to drain the bladder of urine. A weighted speculum was placed into  the vagina and a single tooth tenaculum was applied to the anterior lip of the cervix. The cervix was gently dilated using Hank's Dilators to accommodate a 10 mm suction curette, that was gently advanced to the uterine fundus.  The suction device was then activated and the curette was slowly rotated to clear the uterine cavity of products of conception.  A sharp curettage with a serrated curette  was then performed to confirm complete emptying of the uterus. Minimal bleeding was encountered. The tenaculum was removed along with all instruments  from the  vagina.   The patient was awakened, mobilized and taken to the recovery room in satisfactory condition.  The procedure was well-tolerated.  The patient will be discharged to  home as per PACU criteria.  Routine postoperative instructions were given along with a prescription for analgesics.  She will follow up in the clinic in 1 week for postoperative evaluation.  Herold HarmsMartin A Emelio Schneller, MD [facility]

## 2016-03-15 NOTE — Progress Notes (Signed)
[name]- pt is having some spotting has been going on since Thursday night late, called office I gave her instructions on what to do, pt is having some lower abd cramping

## 2016-03-15 NOTE — Anesthesia Post-op Follow-up Note (Cosign Needed)
Anesthesia QCDR form completed.        

## 2016-03-15 NOTE — Anesthesia Postprocedure Evaluation (Signed)
Anesthesia Post Note  Patient: Sydney James  Procedure(s) Performed: Procedure(s) (LRB): DILATATION AND EVACUATION (N/A)  Patient location during evaluation: PACU Anesthesia Type: General Level of consciousness: awake and alert Pain management: pain level controlled Vital Signs Assessment: post-procedure vital signs reviewed and stable Respiratory status: spontaneous breathing, nonlabored ventilation, respiratory function stable and patient connected to nasal cannula oxygen Cardiovascular status: blood pressure returned to baseline and stable Postop Assessment: no signs of nausea or vomiting Anesthetic complications: no     Last Vitals:  Vitals:   03/15/16 1630 03/15/16 1653  BP: 118/74 121/71  Pulse: 70   Resp: 16 16  Temp: 36.8 C     Last Pain:  Vitals:   03/15/16 1617  TempSrc:   PainSc: 0-No pain                 Lenard SimmerAndrew Facundo Allemand

## 2016-03-15 NOTE — H&P (Signed)
H&P Encounter Date: 03/15/2016 10:00 AM Herold HarmsMartin A Dniya Neuhaus, MD  Obstetrics and Gynecology    [] Hide copied text [] Hover for attribution information Subjective:   PREOPERATIVE HISTORY AND PHYSICAL   Date of surgery: 03/15/2016 Diagnosis: Missed AB Procedure: Suction D&C    Patient is a 21 y.o. G2P011700female scheduled for suction D&C for management of missed abortion. During her new OB history and physical appointment today patient was noted to have an intrauterine fetal demise with ultrasound demonstrating crown-rump length of 9 weeks with fetus showing no fetal cardiac activity patient has had mild spotting and cramping without passage of tissue.           OB History    Gravida Para Term Preterm AB Living   2 1   1    0   SAB TAB Ectopic Multiple Live Births         0       stop Patient's last menstrual period was 12/25/2015 (approximate).      Past Medical History:  Diagnosis Date  . Migraine   . Preeclampsia   . Scoliosis    Back brace for 2 years  . Scoliosis     No past surgical history on file.                  OB History  Gravida Para Term Preterm AB Living  2 1   1    0  SAB TAB Ectopic Multiple Live Births        0      # Outcome Date GA Lbr Len/2nd Weight Sex Delivery Anes PTL Lv  2 Current           1 Preterm 01/25/15 4280w1d 03:30 / 01:45 6 lb 4.2 oz (2.84 kg) M Vag-Spont EPI        Social History        Social History  . Marital status: Married    Spouse name: N/A  . Number of children: N/A  . Years of education: N/A        Social History Main Topics  . Smoking status: Never Smoker  . Smokeless tobacco: Never Used  . Alcohol use No  . Drug use: No  . Sexual activity: Yes    Birth control/ protection: None       Other Topics Concern  . Not on file      Social History Narrative  . No narrative on file         Family History  Problem Relation Age of Onset  . Hypertension Father   .  Hypertension Mother      (Not in a hospital admission)       Allergies  Allergen Reactions  . Aloe Other (See Comments)    Unknown reaction  . Lubricants     Has to have water based lubricant  . Tape Itching and Rash    Review of Systems Constitutional: No recent fever/chills/sweats Respiratory: No recent cough/bronchitis Cardiovascular: No chest pain Gastrointestinal: No recent nausea/vomiting/diarrhea Genitourinary: No UTI symptoms Hematologic/lymphatic:No history of coagulopathy or recent blood thinner use    Objective:    BP 138/82   Pulse 77   Wt 184 lb 14.4 oz (83.9 kg)   LMP 12/25/2015 (Approximate)   BMI 28.96 kg/m   General:   Normal  Skin:   normal  HEENT:  Normal  Neck:  Supple without Adenopathy or Thyromegaly  Lungs:   Heart:  Breasts:   Abdomen:  Pelvis:  M/S   Extremeties:  Neuro:    clear to auscultation bilaterally   Normal without murmur   Not Examined   soft, non-tender; bowel sounds normal; no masses,  no organomegaly   Exam deferred to OR  No CVAT  Warm/Dry   Normal         Assessment:    Missed AB  Plan:  Suction D&C  Preoperative counseling: Patient is to undergo suction D&C for missed AB. She is understanding of the planned procedure and is aware of and is accepting of all surgical risks which include but are not limited to bleeding, infection, pelvic organ injury with need for repair, uterine perforation, anesthesia risks, blood clot disorders, etc. All questions have been answered. Informed consent is given. Patient is ready and willing to proceed with surgery as scheduled.  Herold Harms, MD  Note: This dictation was prepared with Dragon dictation along with smaller phrase technology. Any transcriptional errors that result from this process are unintentional.      Electronically signed by Herold Harms, MD at 03/15/2016 12:28 PM      Initial Prenatal on 03/15/2016         Routing History        Detailed Report

## 2016-03-15 NOTE — Discharge Instructions (Signed)
AMBULATORY SURGERY  °DISCHARGE INSTRUCTIONS ° ° °1) The drugs that you were given will stay in your system until tomorrow so for the next 24 hours you should not: ° °A) Drive an automobile °B) Make any legal decisions °C) Drink any alcoholic beverage ° ° °2) You may resume regular meals tomorrow.  Today it is better to start with liquids and gradually work up to solid foods. ° °You may eat anything you prefer, but it is better to start with liquids, then soup and crackers, and gradually work up to solid foods. ° ° °3) Please notify your doctor immediately if you have any unusual bleeding, trouble breathing, redness and pain at the surgery site, drainage, fever, or pain not relieved by medication. ° ° ° °4) Additional Instructions: ° ° ° ° ° ° ° °Please contact your physician with any problems or Same Day Surgery at [phone], Monday through Friday 6 am to 4 pm, or [place] [facility] [place] Main number at [phone]. °

## 2016-03-15 NOTE — Transfer of Care (Signed)
Immediate Anesthesia Transfer of Care Note  Patient: Joella PrinceMichaela Dier  Procedure(s) Performed: Procedure(s): DILATATION AND EVACUATION (N/A)  Patient Location: PACU  Anesthesia Type:General  Level of Consciousness: sedated  Airway & Oxygen Therapy: Patient Spontanous Breathing and Patient connected to face mask oxygen  Post-op Assessment: Report given to RN and Post -op Vital signs reviewed and stable  Post vital signs: Reviewed and stable  Last Vitals:  Vitals:   03/15/16 1358 03/15/16 1549  BP: 133/84 (P) 97/67  Pulse: 86 97/67  Resp: 20 14  Temp: 36.4 C (P) 36.4 C    Last Pain:  Vitals:   03/15/16 1358  TempSrc: Oral  PainSc: 2       Patients Stated Pain Goal: 1 (03/15/16 1358)  Complications: No apparent anesthesia complications

## 2016-03-15 NOTE — Anesthesia Preprocedure Evaluation (Signed)
Anesthesia Evaluation  Patient identified by MRN, date of birth, ID band Patient awake    Reviewed: Allergy & Precautions, NPO status , Patient's Chart, lab work & pertinent test results  Airway Mallampati: II  TM Distance: >3 FB     Dental   Pulmonary neg pulmonary ROS,    Pulmonary exam normal        Cardiovascular hypertension, Normal cardiovascular exam     Neuro/Psych  Headaches, negative psych ROS   GI/Hepatic negative GI ROS, Neg liver ROS,   Endo/Other  negative endocrine ROS  Renal/GU negative Renal ROS  negative genitourinary   Musculoskeletal scoliosis   Abdominal Normal abdominal exam  (+)   Peds negative pediatric ROS (+)  Hematology negative hematology ROS (+)   Anesthesia Other Findings Past Medical History: No date: Migraine No date: Preeclampsia No date: Scoliosis     Comment: Back brace for 2 years No date: Scoliosis  Reproductive/Obstetrics (+) Pregnancy                             Anesthesia Physical Anesthesia Plan  ASA: II  Anesthesia Plan: General   Post-op Pain Management:    Induction: Intravenous  Airway Management Planned: LMA and Oral ETT  Additional Equipment:   Intra-op Plan:   Post-operative Plan: Extubation in OR  Informed Consent: I have reviewed the patients History and Physical, chart, labs and discussed the procedure including the risks, benefits and alternatives for the proposed anesthesia with the patient or authorized representative who has indicated his/her understanding and acceptance.     Plan Discussed with: CRNA and Surgeon  Anesthesia Plan Comments:         Anesthesia Quick Evaluation

## 2016-03-15 NOTE — Progress Notes (Signed)
NEW OB HISTORY AND PHYSICAL  SUBJECTIVE:       Sydney James is a 21 y.o. 23P0100 female, Patient's last menstrual period was 12/25/2015 (approximate)., Estimated Date of Delivery: 09/30/16, 3159w4d, presents today for establishment of Prenatal Care. She has no unusual complaints and complains of moderate to heavy spotting with cramping last few days.      Gynecologic History Patient's last menstrual period was 12/25/2015 (approximate). Normal Contraception: none   Obstetric History OB History  Gravida Para Term Preterm AB Living  2 1   1    0  SAB TAB Ectopic Multiple Live Births        0      # Outcome Date GA Lbr Len/2nd Weight Sex Delivery Anes PTL Lv  2 Current           1 Preterm 01/25/15 [redacted]w[redacted]d 03:30 / 01:45 6 lb 4.2 oz (2.84 kg) M Vag-Spont EPI        Past Medical History:  Diagnosis Date  . Migraine   . Preeclampsia   . Scoliosis    Back brace for 2 years  . Scoliosis     No past surgical history on file.  Current Outpatient Prescriptions on File Prior to Visit  Medication Sig Dispense Refill  . Prenatal Vit-Fe Fumarate-FA (MULTIVITAMIN-PRENATAL) 27-0.8 MG TABS tablet Take 1 tablet by mouth daily at 12 noon. Reported on 07/10/2015    . Vitamin D, Ergocalciferol, (DRISDOL) 50000 units CAPS capsule Take 1 capsule (50,000 Units total) by mouth 2 (two) times a week. 30 capsule 2  . Doxylamine-Pyridoxine 10-10 MG TBEC Take 1 tablet by mouth daily. (Patient not taking: Reported on 03/15/2016) 60 tablet 2  . fluconazole (DIFLUCAN) 100 MG tablet Take 1 tablet (100 mg total) by mouth daily. (Patient not taking: Reported on 03/15/2016) 7 tablet 0  . Probiotic Product (ACIDOPHILUS) 90-25 MG CHEW Chew 1 tablet by mouth 3 x daily with food. (Patient not taking: Reported on 07/10/2015) 90 each 0   No current facility-administered medications on file prior to visit.     Allergies  Allergen Reactions  . Aloe Other (See Comments)    Unknown reaction  . Lubricants     Has to have  water based lubricant  . Tape Itching and Rash    Social History   Social History  . Marital status: Married    Spouse name: N/A  . Number of children: N/A  . Years of education: N/A   Occupational History  . Not on file.   Social History Main Topics  . Smoking status: Never Smoker  . Smokeless tobacco: Never Used  . Alcohol use No  . Drug use: No  . Sexual activity: Yes    Birth control/ protection: None   Other Topics Concern  . Not on file   Social History Narrative  . No narrative on file    Family History  Problem Relation Age of Onset  . Hypertension Father   . Hypertension Mother     The following portions of the patient's history were reviewed and updated as appropriate: allergies, current medications, past OB history, past medical history, past surgical history, past family history, past social history, and problem list.    OBJECTIVE: Initial Physical Exam (New OB)  GENERAL APPEARANCE: alert, well appearing, in no apparent distress, oriented to person, place and time HEAD: normocephalic, atraumatic MOUTH: mucous membranes moist, pharynx normal without lesions THYROID: no thyromegaly or masses present BREASTS: not examined LUNGS: clear to auscultation, no  wheezes, rales or rhonchi, symmetric air entry HEART: regular rate and rhythm, no murmurs ABDOMEN: soft, nontender, nondistended, no abnormal masses, no epigastric pain, FHT not heard and dark red blood noted. EXTREMITIES: no redness or tenderness in the calves or thighs SKIN: normal coloration and turgor, no rashes LYMPH NODES: no adenopathy palpable NEUROLOGIC: alert, oriented, normal speech, no focal findings or movement disorder noted  PELVIC EXAM EXTERNAL GENITALIA: normal appearing vulva with no masses, tenderness or lesions VAGINA: blood present, cervix closed and thick, non-tender.  ASSESSMENT: Missed abortion  PLAN: Desires D&C, Dr Greggory Keen with [facility].

## 2016-03-16 ENCOUNTER — Encounter: Payer: Self-pay | Admitting: Obstetrics and Gynecology

## 2016-03-16 LAB — RPR: RPR Ser Ql: NONREACTIVE

## 2016-03-17 LAB — SURGICAL PATHOLOGY

## 2016-03-22 ENCOUNTER — Ambulatory Visit (INDEPENDENT_AMBULATORY_CARE_PROVIDER_SITE_OTHER): Admitting: Obstetrics and Gynecology

## 2016-03-22 ENCOUNTER — Encounter: Payer: Self-pay | Admitting: Obstetrics and Gynecology

## 2016-03-22 VITALS — BP 145/83 | HR 94 | Ht 67.0 in | Wt 185.3 lb

## 2016-03-22 DIAGNOSIS — Z9889 Other specified postprocedural states: Secondary | ICD-10-CM

## 2016-03-22 DIAGNOSIS — O021 Missed abortion: Secondary | ICD-10-CM | POA: Insufficient documentation

## 2016-03-22 DIAGNOSIS — Z09 Encounter for follow-up examination after completed treatment for conditions other than malignant neoplasm: Secondary | ICD-10-CM

## 2016-03-22 NOTE — Patient Instructions (Signed)
1. Avoid intercourse for one more week 2.resume all other activities without restrictions 3. Continue with prenatal vitamins daily 4. Return if conception occurs or for annual exam as scheduled

## 2016-03-22 NOTE — Progress Notes (Signed)
Chief complaint: 1. Postop check 2. History of missed AB 3. Status post suction D&C  Patient presents for final postop check status post suction D&C for miscarriage. She is doing well with normal bowel and bladder function. She is not expressing any significant pain.She denies any significant vaginal bleeding or discharge. Emotionally she is coping well with her pregnancy loss. She and her husband are interested in trying to conceive in the near future.  OBJECTIVE: BP (!) 145/83   Pulse 94   Ht 5\' 7"  (1.702 m)   Wt 185 lb 4.8 oz (84.1 kg)   LMP 12/25/2015 (Approximate)   BMI 29.02 kg/m  Physical exam-deferred  Pathology: POC  ASSESSMENT: 1. Normal postop check status post suction D&C for missed AB  PLAN: 1. Avoid intercourse for one more week 2. Other activities without restriction 3. Continue taking prenatal vitamins 4. Return when conception occurs or for annual exam as scheduled.  Herold HarmsMartin A Kenlee Maler, MD   Note: This dictation was prepared with Dragon dictation along with smaller phrase technology. Any transcriptional errors that result from this process are unintentional.

## 2016-03-23 ENCOUNTER — Encounter: Admitting: Obstetrics and Gynecology

## 2016-03-31 ENCOUNTER — Encounter: Payer: Self-pay | Admitting: Obstetrics and Gynecology

## 2016-04-28 IMAGING — US US MFM OB FOLLOW-UP
1 series · 14 of 28 positions shown · non-contrast
Comparison: none

[Series 1: us mfm ob follow-up · 0.26mm/px · 14 of 53 slices shown]
[im 2/53]
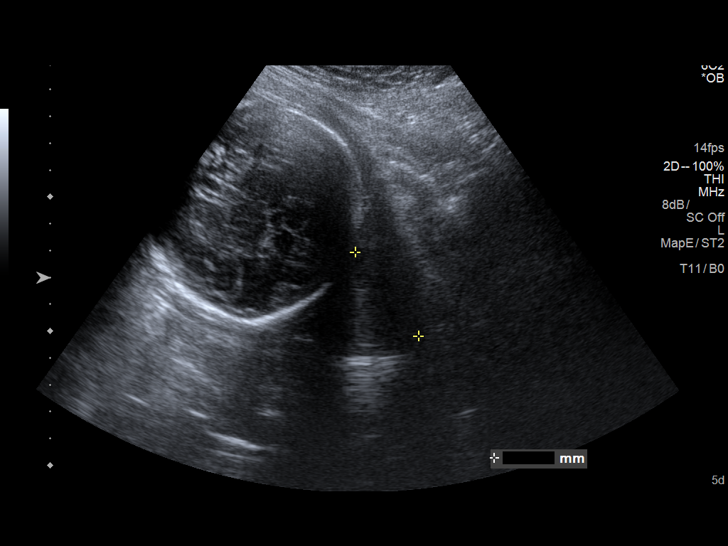
[im 6/53]
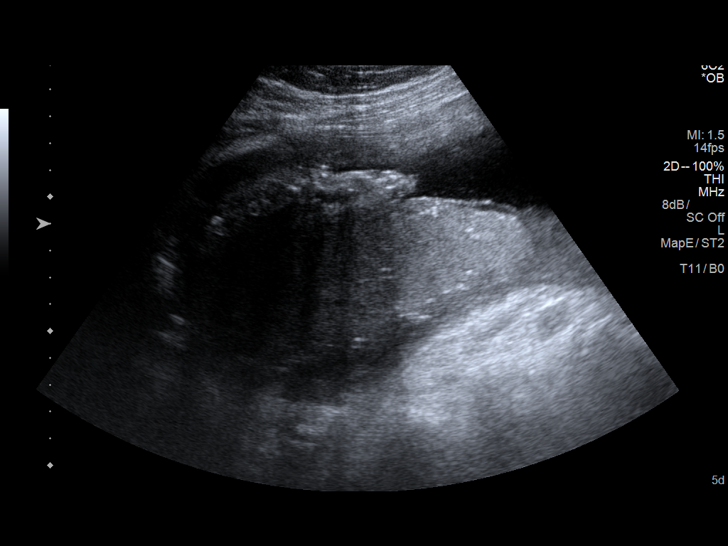
[im 10/53]
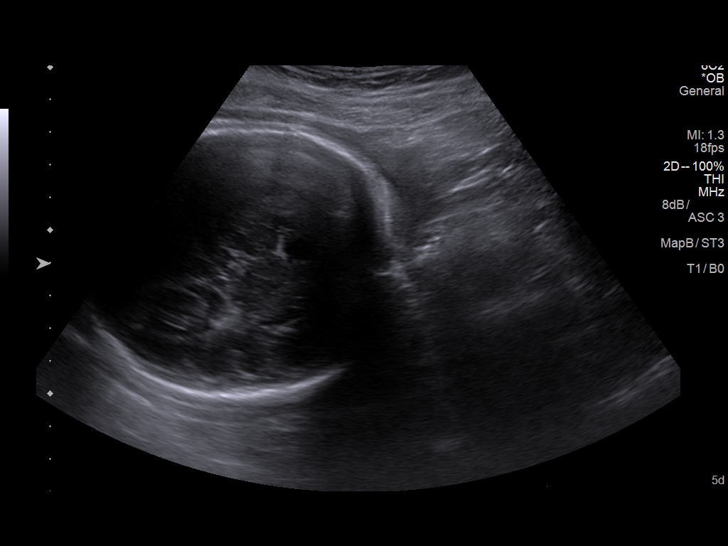
[im 14/53]
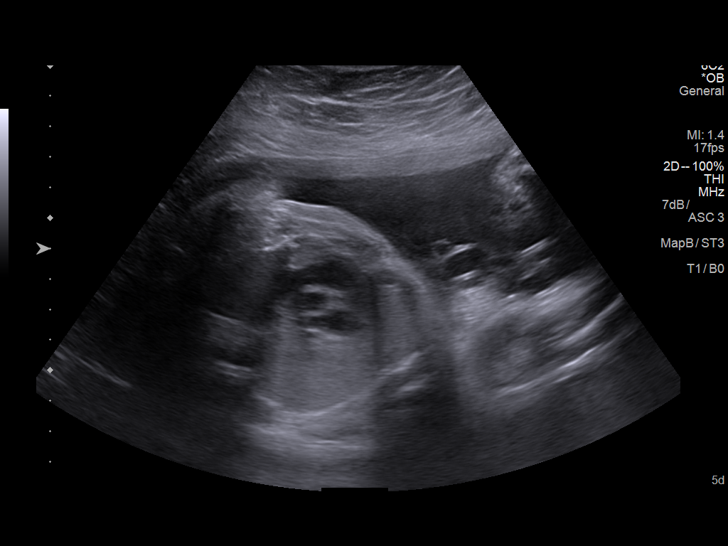
[im 18/53]
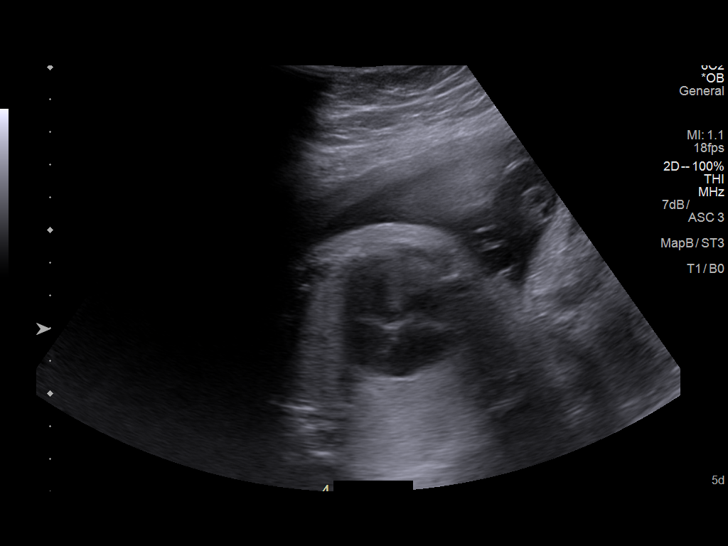
[im 22/53]
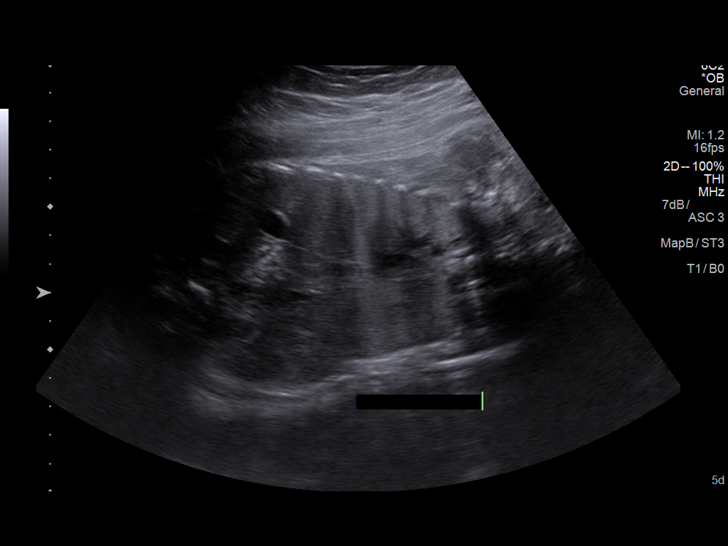
[im 26/53]
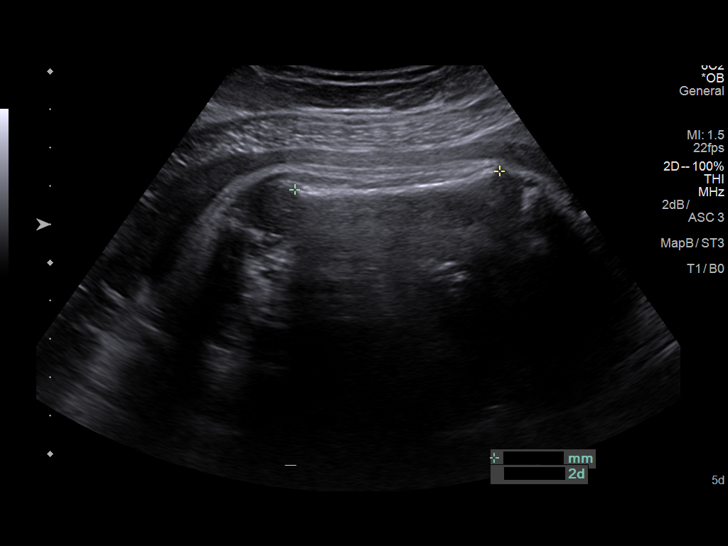
[im 29/53]
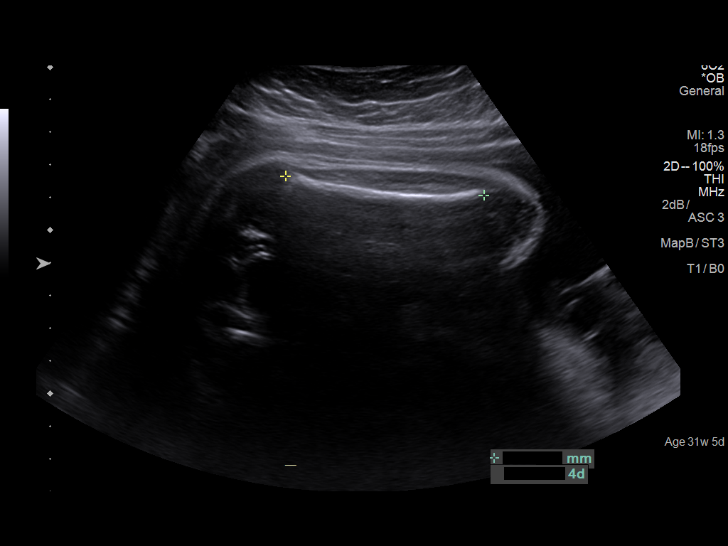
[im 33/53]
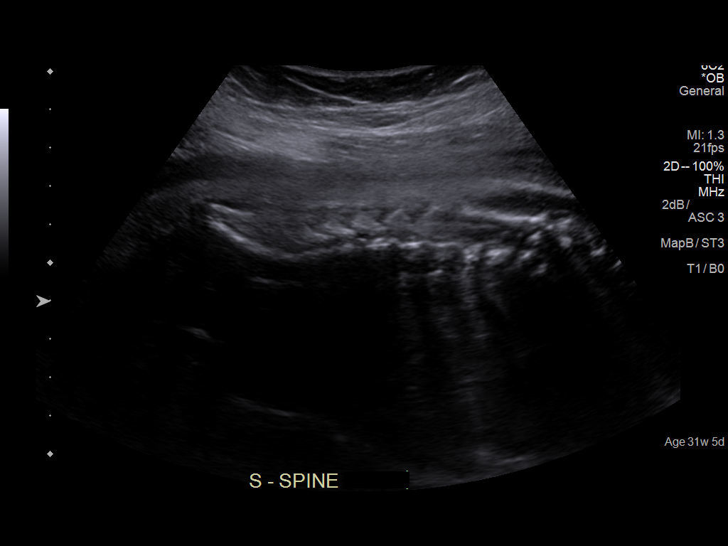
[im 37/53]
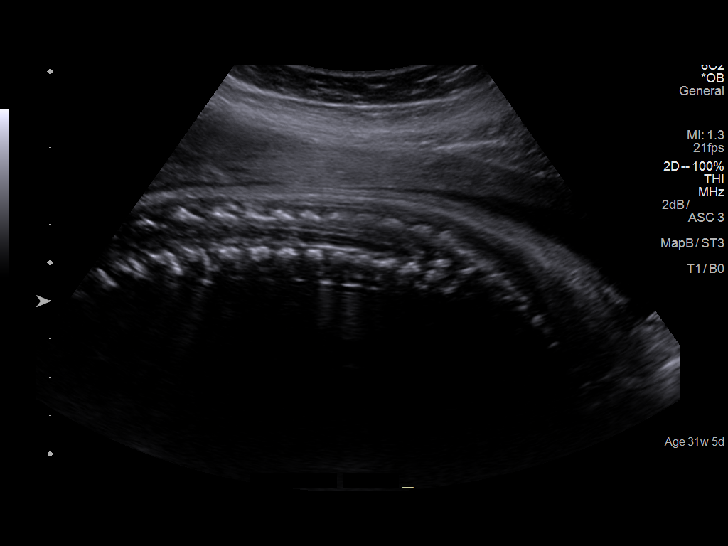
[im 41/53]
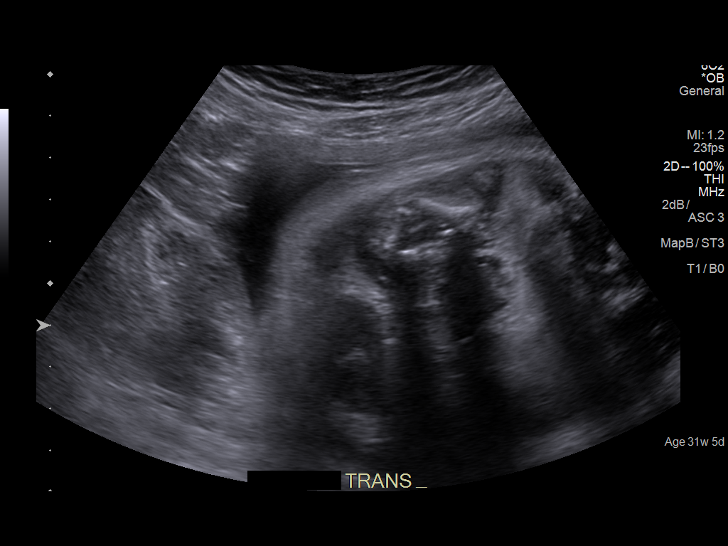
[im 45/53]
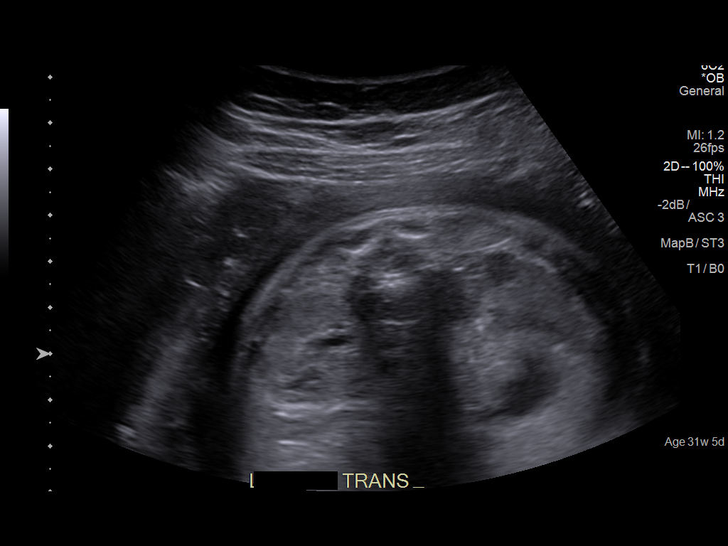
[im 49/53]
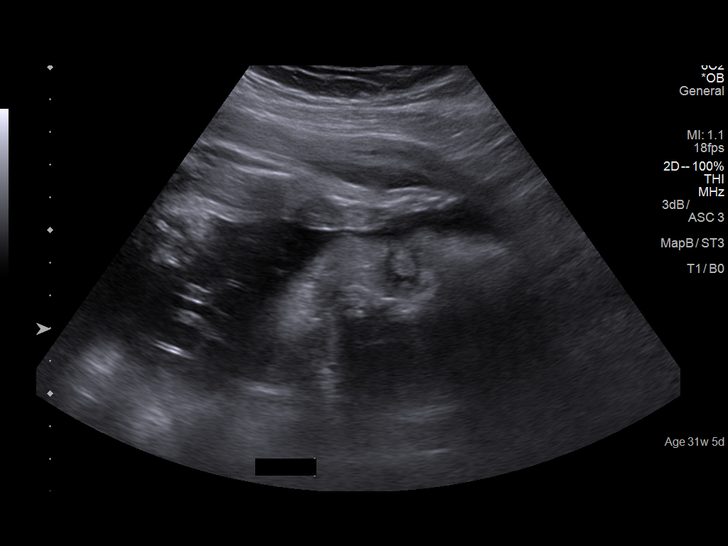
[im 53/53]
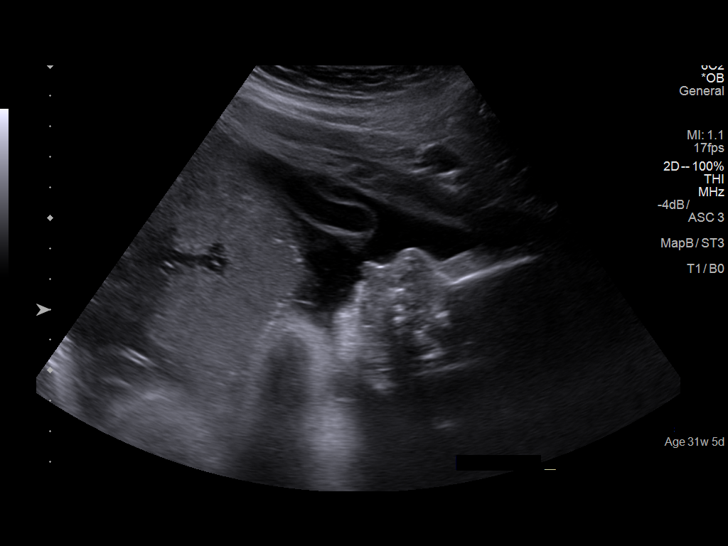

[14 of 28 positions shown; findings below may reference images not displayed]

Canned report from images found in remote index.

Refer to host system for actual result text.

## 2016-05-04 ENCOUNTER — Emergency Department
Admission: EM | Admit: 2016-05-04 | Discharge: 2016-05-04 | Disposition: A | Discharge status: Home / Self Care | Attending: Emergency Medicine | Admitting: Emergency Medicine

## 2016-05-04 ENCOUNTER — Encounter: Payer: Self-pay | Admitting: Emergency Medicine

## 2016-05-04 ENCOUNTER — Emergency Department

## 2016-05-04 DIAGNOSIS — W541XXA Struck by dog, initial encounter: Secondary | ICD-10-CM | POA: Diagnosis not present

## 2016-05-04 DIAGNOSIS — Y9389 Activity, other specified: Secondary | ICD-10-CM | POA: Diagnosis not present

## 2016-05-04 DIAGNOSIS — S4991XA Unspecified injury of right shoulder and upper arm, initial encounter: Secondary | ICD-10-CM | POA: Diagnosis present

## 2016-05-04 DIAGNOSIS — Y929 Unspecified place or not applicable: Secondary | ICD-10-CM | POA: Diagnosis not present

## 2016-05-04 DIAGNOSIS — M7541 Impingement syndrome of right shoulder: Secondary | ICD-10-CM | POA: Diagnosis not present

## 2016-05-04 DIAGNOSIS — Y999 Unspecified external cause status: Secondary | ICD-10-CM | POA: Diagnosis not present

## 2016-05-04 DIAGNOSIS — Z791 Long term (current) use of non-steroidal anti-inflammatories (NSAID): Secondary | ICD-10-CM | POA: Diagnosis not present

## 2016-05-04 MED ORDER — HYDROCODONE-ACETAMINOPHEN 5-325 MG PO TABS
1.0000 | ORAL_TABLET | Freq: Once | ORAL | Status: AC
  Administered 2016-05-04: 1 via ORAL
  Filled 2016-05-04: qty 1

## 2016-05-04 MED ORDER — KETOROLAC TROMETHAMINE 60 MG/2ML IM SOLN
60.0000 mg | Freq: Once | INTRAMUSCULAR | Status: AC
  Administered 2016-05-04: 60 mg via INTRAMUSCULAR
  Filled 2016-05-04: qty 2

## 2016-05-04 MED ORDER — MELOXICAM 15 MG PO TABS: 15.0000 mg | ORAL_TABLET | Freq: Every day | ORAL | 0 refills | Status: DC

## 2016-05-04 NOTE — ED Triage Notes (Signed)
Reports dog slammed her into door jam.  Patient complaining of right clavicle pain.

## 2016-05-04 NOTE — ED Notes (Signed)
See triage note..the patient c/o R shoulder/clavicle pain r/t injury. Pt denies other medical complaints at this time. Pt able to move arm w/ pain, no bruising noted. NAD

## 2016-05-04 NOTE — ED Provider Notes (Signed)
[facility] Emergency Department Provider Note  ____________________________________________  Time seen: Approximately 8:14 PM  I have reviewed the triage vital signs and the nursing notes.   HISTORY  Chief Complaint Clavicle Injury    HPI Sydney James is a 21 y.o. female who presents to emergency department complaining of right shoulder/clavicle injury. Patient reports that she was attempting to let her dog out when he jumped, pushing her into the door frame. Patient reports that she struck directly on the door frame with her right clavicle. She is having significant pain to the anterior right shoulder. She reports full range of motion of all digits right hand. No radicular symptoms. Patient reports that pain extends from the clavicle into the right upper arm. She has limited range of motion of her shoulder due to pain. No other injury. She did not hit her head or lose consciousness. No difficulty breathing, shortness of breath, coughing. No medications prior to arrival for pain.   Past Medical History:  Diagnosis Date  . Migraine   . Preeclampsia   . Scoliosis    Back brace for 2 years  . Scoliosis     Patient Active Problem List   Diagnosis Date Noted  . Status post D&C 03/22/2016  . Missed abortion 03/22/2016  . GBS bacteriuria 02/24/2016  . PIH (pregnancy induced hypertension), antepartum 01/23/2015    Past Surgical History:  Procedure Laterality Date  . DILATION AND EVACUATION N/A 03/15/2016   Procedure: DILATATION AND EVACUATION;  Surgeon: Herold Harms, MD;  Location: [facility] ORS;  Service: Gynecology;  Laterality: N/A;    Prior to Admission medications   Medication Sig Start Date End Date Taking? Authorizing Provider  ibuprofen (ADVIL,MOTRIN) 800 MG tablet Take 1 tablet (800 mg total) by mouth 3 (three) times daily. 03/15/16   Prentice Docker Defrancesco, MD  meloxicam (MOBIC) 15 MG tablet Take 1 tablet (15 mg total) by mouth daily. 05/04/16    Delorise Royals D'Arcy Abraha, PA-C  Prenatal Vit-Fe Fumarate-FA (MULTIVITAMIN-PRENATAL) 27-0.8 MG TABS tablet Take 1 tablet by mouth daily at 12 noon. Reported on 07/10/2015    Historical Provider, MD  Vitamin D, Ergocalciferol, (DRISDOL) 50000 units CAPS capsule Take 1 capsule (50,000 Units total) by mouth 2 (two) times a week. 01/28/16   Melody N Shambley, CNM    Allergies Aloe; Lubricants; and Tape  Family History  Problem Relation Age of Onset  . Hypertension Father   . Hypertension Mother     Social History Social History  Substance Use Topics  . Smoking status: Never Smoker  . Smokeless tobacco: Never Used  . Alcohol use No     Review of Systems  Constitutional: No fever/chills Eyes: No visual changes.  Cardiovascular: no chest pain. Respiratory: no cough. No SOB. Musculoskeletal: Positive for right shoulder/clavicular pain Skin: Negative for rash, abrasions, lacerations, ecchymosis. Neurological: Negative for headaches, focal weakness or numbness. 10-point ROS otherwise negative.  ____________________________________________   PHYSICAL EXAM:  VITAL SIGNS: ED Triage Vitals  Enc Vitals Group     BP 05/04/16 2005 135/85     Pulse Rate 05/04/16 2005 62     Resp 05/04/16 2005 18     Temp 05/04/16 2005 98 F (36.7 C)     Temp Source 05/04/16 2005 Oral     SpO2 05/04/16 2005 97 %     Weight --      Height --      Head Circumference --      Peak Flow --  Pain Score 05/04/16 2007 8     Pain Loc --      Pain Edu? --      Excl. in GC? --      Constitutional: Alert and oriented. Well appearing and in no acute distress. Eyes: Conjunctivae are normal. PERRL. EOMI. Head: Atraumatic. Neck: No stridor.  No cervical spine tenderness to palpation.  Cardiovascular: Normal rate, regular rhythm. Normal S1 and S2.  Good peripheral circulation. Respiratory: Normal respiratory effort without tachypnea or retractions. Lungs CTAB. Good air entry to the bases with no decreased or  absent breath sounds. Musculoskeletal: Limited range of motion to the right upper extremity. Patient has visible edema to the right anterior shoulder. Patient is nontender to palpation of the osseous and muscular structures of the posterior and lateral shoulder. Patient is tender to palpation starting at the [facility] approximately along the clavicle. No Palpable abnormality.. Radial pulses intact distally. Sensation intact all 5 digits right upper shoulder me. Neurologic:  Normal speech and language. No gross focal neurologic deficits are appreciated.  Skin:  Skin is warm, dry and intact. No rash noted. Psychiatric: Mood and affect are normal. Speech and behavior are normal. Patient exhibits appropriate insight and judgement.   ____________________________________________   LABS (all labs ordered are listed, but only abnormal results are displayed)  Labs Reviewed - No data to display ____________________________________________  EKG   ____________________________________________  RADIOLOGY Festus Barren Mattalynn Crandle, personally viewed and evaluated these images (plain radiographs) as part of my medical decision making, as well as reviewing the written report by the radiologist.  Dg Clavicle Right  Result Date: 05/04/2016 CLINICAL DATA:  Shoulder pain following walking into door frame, initial encounter EXAM: RIGHT CLAVICLE - 2+ VIEWS COMPARISON:  None. FINDINGS: There is no evidence of fracture or other focal bone lesions. Soft tissues are unremarkable. IMPRESSION: No acute abnormality noted. Electronically Signed   By: Alcide Clever M.D.   On: 05/04/2016 20:40    ____________________________________________    PROCEDURES  Procedure(s) performed:    Procedures    Medications  ketorolac (TORADOL) injection 60 mg (not administered)  HYDROcodone-acetaminophen (NORCO/VICODIN) 5-325 MG per tablet 1 tablet (1 tablet Oral Given 05/04/16 2037)      ____________________________________________   INITIAL IMPRESSION / ASSESSMENT AND PLAN / ED COURSE  Pertinent labs & imaging results that were available during my care of the patient were reviewed by me and considered in my medical decision making (see chart for details).  Review of the [place] CSRS was performed in accordance of the [facility] prior to dispensing any controlled drugs.     Patient's diagnosis is consistent with right shoulder injury resulting impingement syndrome of the right shoulder. Patient's x-rays returned reassuring results with no clavicle fracture orAC joint separation. Due to location of injury, symptoms, physical exam, likely mild impingement due to inflammation from trauma. No indication at this time for complete tearing of the rotator cuff tendons. Patient is given sling for symptom control. She is given shot of Toradol emergency department.. Patient will be discharged home with prescriptions for anti-inflammatory medications for symptom control. Patient is to follow up with orthopedics as needed or otherwise directed. Patient is given ED precautions to return to the ED for any worsening or new symptoms.     ____________________________________________  FINAL CLINICAL IMPRESSION(S) / ED DIAGNOSES  Final diagnoses:  Injury of right shoulder, initial encounter  Impingement syndrome of right shoulder      NEW MEDICATIONS STARTED DURING THIS VISIT:  New Prescriptions  MELOXICAM (MOBIC) 15 MG TABLET    Take 1 tablet (15 mg total) by mouth daily.        This chart was dictated using voice recognition software/Dragon. Despite best efforts to proofread, errors can occur which can change the meaning. Any change was purely unintentional.    Racheal Patches, PA-C 05/04/16 2207    Minna Antis, MD 05/04/16 2214

## 2016-06-01 ENCOUNTER — Encounter: Payer: Self-pay | Admitting: Obstetrics and Gynecology

## 2016-06-02 ENCOUNTER — Other Ambulatory Visit

## 2016-06-02 ENCOUNTER — Other Ambulatory Visit: Payer: Self-pay | Admitting: Obstetrics and Gynecology

## 2016-06-02 ENCOUNTER — Encounter: Payer: Self-pay | Admitting: Obstetrics and Gynecology

## 2016-06-02 DIAGNOSIS — N939 Abnormal uterine and vaginal bleeding, unspecified: Secondary | ICD-10-CM

## 2016-06-03 LAB — CBC
HEMATOCRIT: 40.4 % (ref 34.0–46.6)
Hemoglobin: 13.7 g/dL (ref 11.1–15.9)
MCH: 28.8 pg (ref 26.6–33.0)
MCHC: 33.9 g/dL (ref 31.5–35.7)
MCV: 85 fL (ref 79–97)
Platelets: 353 10*3/uL (ref 150–379)
RBC: 4.76 x10E6/uL (ref 3.77–5.28)
RDW: 12.7 % (ref 12.3–15.4)
WBC: 8.2 10*3/uL (ref 3.4–10.8)

## 2016-06-03 LAB — FERRITIN: Ferritin: 28 ng/mL (ref 15–150)

## 2016-07-13 ENCOUNTER — Encounter: Payer: Self-pay | Admitting: Obstetrics and Gynecology

## 2016-07-13 ENCOUNTER — Ambulatory Visit (INDEPENDENT_AMBULATORY_CARE_PROVIDER_SITE_OTHER): Admitting: Obstetrics and Gynecology

## 2016-07-13 VITALS — BP 136/81 | HR 87 | Ht 67.0 in | Wt 186.8 lb

## 2016-07-13 DIAGNOSIS — Z01419 Encounter for gynecological examination (general) (routine) without abnormal findings: Secondary | ICD-10-CM | POA: Diagnosis not present

## 2016-07-13 DIAGNOSIS — N920 Excessive and frequent menstruation with regular cycle: Secondary | ICD-10-CM | POA: Diagnosis not present

## 2016-07-13 DIAGNOSIS — E663 Overweight: Secondary | ICD-10-CM | POA: Diagnosis not present

## 2016-07-13 MED ORDER — CYANOCOBALAMIN 1000 MCG/ML IJ SOLN: 1000.0000 ug | INTRAMUSCULAR | 1 refills | Status: DC

## 2016-07-13 MED ORDER — MEDROXYPROGESTERONE ACETATE 150 MG/ML IM SUSP: 150.0000 mg | INTRAMUSCULAR | 4 refills | Status: DC

## 2016-07-13 NOTE — Progress Notes (Signed)
   Subjective:     Sydney PrinceMichaela James is a married white 21 y.o. female and is here for a comprehensive physical exam. Last pap on 07/2015 was negative.The patient reports no problems.  [facility], has to change pads every hour, even through the night, with large clots. Desires BC until weight loss happens.  Been exercising regular with personal trainer to lose weight. Lost 15# on [employer] diet.  Cut out gluten and dairy/sugar. Increased water intake.   Social History   Social History  . Marital status: Married    Spouse name: N/A  . Number of children: N/A  . Years of education: N/A   Occupational History  . Not on file.   Social History Main Topics  . Smoking status: Never Smoker  . Smokeless tobacco: Never Used  . Alcohol use No  . Drug use: No  . Sexual activity: Yes    Birth control/ protection: None   Other Topics Concern  . Not on file   Social History Narrative  . No narrative on file   Health Maintenance  Topic Date Due  . INFLUENZA VACCINE  08/03/2016  . CHLAMYDIA SCREENING  02/18/2017  . PAP SMEAR  07/10/2018  . TETANUS/TDAP  12/01/2024  . HIV Screening  Completed    The following portions of the patient's history were reviewed and updated as appropriate: allergies, current medications, past family history, past medical history, past social history, past surgical history and problem list.  Review of Systems A comprehensive review of systems was negative.   Objective:  Blood pressure 136/81, pulse 87, height 5\' 7"  (1.702 m), weight 186 lb 12.8 oz (84.7 kg), last menstrual period 07/05/2016, unknown if currently breastfeeding. Body mass index is 29.26 kg/m.    General appearance: alert, cooperative and appears stated age Neck: no adenopathy, no carotid bruit, no JVD, supple, symmetrical, trachea midline and thyroid not enlarged, symmetric, no tenderness/mass/nodules Lungs: clear to auscultation bilaterally Breasts: normal appearance, no  masses or tenderness Heart: regular rate and rhythm, S1, S2 normal, no murmur, click, rub or gallop Abdomen: soft, non-tender; bowel sounds normal; no masses,  no organomegaly Pelvic: cervix normal in appearance, external genitalia normal, no adnexal masses or tenderness, no cervical motion tenderness, rectovaginal septum normal, uterus normal size, shape, and consistency and vagina normal without discharge Extremities: extremities normal, atraumatic, no cyanosis or edema    Assessment:    Healthy female exam. Over weight; menorrhagia; contraception counseling     Plan:  Labs obtained, will follow up accordingly. RTC tomorrow with depo to start and also plans to start weight loss program.  Harlow MaresMelody Alix Lahmann, CNM   See After Visit Summary for Counseling Recommendations

## 2016-07-13 NOTE — Patient Instructions (Signed)
Place annual gynecologic exam patient instructions here.

## 2016-07-14 ENCOUNTER — Ambulatory Visit (INDEPENDENT_AMBULATORY_CARE_PROVIDER_SITE_OTHER): Admitting: Obstetrics and Gynecology

## 2016-07-14 ENCOUNTER — Encounter: Payer: Self-pay | Admitting: Obstetrics and Gynecology

## 2016-07-14 VITALS — BP 127/80 | HR 75 | Ht 67.0 in | Wt 184.3 lb

## 2016-07-14 DIAGNOSIS — E663 Overweight: Secondary | ICD-10-CM | POA: Diagnosis not present

## 2016-07-14 DIAGNOSIS — Z30013 Encounter for initial prescription of injectable contraceptive: Secondary | ICD-10-CM

## 2016-07-14 LAB — CBC
Hematocrit: 39.9 % (ref 34.0–46.6)
Hemoglobin: 13.1 g/dL (ref 11.1–15.9)
MCH: 28.4 pg (ref 26.6–33.0)
MCHC: 32.8 g/dL (ref 31.5–35.7)
MCV: 86 fL (ref 79–97)
PLATELETS: 321 10*3/uL (ref 150–379)
RBC: 4.62 x10E6/uL (ref 3.77–5.28)
RDW: 13 % (ref 12.3–15.4)
WBC: 7 10*3/uL (ref 3.4–10.8)

## 2016-07-14 LAB — COMPREHENSIVE METABOLIC PANEL
ALBUMIN: 4.3 g/dL (ref 3.5–5.5)
ALT: 25 IU/L (ref 0–32)
AST: 17 IU/L (ref 0–40)
Albumin/Globulin Ratio: 1.7 (ref 1.2–2.2)
Alkaline Phosphatase: 60 IU/L (ref 39–117)
BUN / CREAT RATIO: 19 (ref 9–23)
BUN: 13 mg/dL (ref 6–20)
Bilirubin Total: 0.3 mg/dL (ref 0.0–1.2)
CALCIUM: 9.3 mg/dL (ref 8.7–10.2)
CHLORIDE: 102 mmol/L (ref 96–106)
CO2: 23 mmol/L (ref 20–29)
CREATININE: 0.69 mg/dL (ref 0.57–1.00)
GFR calc non Af Amer: 124 mL/min/{1.73_m2} (ref >59)
GFR, EST AFRICAN AMERICAN: 143 mL/min/{1.73_m2} (ref >59)
GLUCOSE: 75 mg/dL (ref 65–99)
Globulin, Total: 2.5 g/dL (ref 1.5–4.5)
Potassium: 4.7 mmol/L (ref 3.5–5.2)
Sodium: 140 mmol/L (ref 134–144)
TOTAL PROTEIN: 6.8 g/dL (ref 6.0–8.5)

## 2016-07-14 LAB — FERRITIN: FERRITIN: 21 ng/mL (ref 15–150)

## 2016-07-14 LAB — B12 AND FOLATE PANEL
Folate: 11.8 ng/mL (ref >3.0)
VITAMIN B 12: 571 pg/mL (ref 232–1245)

## 2016-07-14 LAB — TSH: TSH: 1.36 u[IU]/mL (ref 0.450–4.500)

## 2016-07-14 MED ORDER — CYANOCOBALAMIN 1000 MCG/ML IJ SOLN
1000.0000 ug | Freq: Once | INTRAMUSCULAR | Status: AC
  Administered 2016-07-14: 1000 ug via INTRAMUSCULAR

## 2016-07-14 MED ORDER — MEDROXYPROGESTERONE ACETATE 150 MG/ML IM SUSP
150.0000 mg | Freq: Once | INTRAMUSCULAR | Status: AC
  Administered 2016-07-14: 150 mg via INTRAMUSCULAR

## 2016-07-14 NOTE — Patient Instructions (Signed)

## 2016-07-14 NOTE — Progress Notes (Signed)
Date last pap: 07/10/15 Last Depo-Provera: Initiated today 07/14/16 Side Effects if any: N/A Serum HCG indicated? No pt's first injection  Depo-Provera 150 mg IM given by: Dorita Fray. Yetta Marceaux, CMA Pt tolerated well. Injection given in RT gluteal muscle.  Next appointment due: 09/29/16 - 10/13/16   Pt presents for weight, B/P, B-12 injection. No side effects of medication-Phentermine, or B-12.  Weight loss/gain of __0___ lbs. As this is initial visit. Encouraged eating healthy and exercise.

## 2016-07-21 ENCOUNTER — Other Ambulatory Visit: Payer: Self-pay | Admitting: *Deleted

## 2016-07-21 ENCOUNTER — Telehealth: Payer: Self-pay | Admitting: Obstetrics and Gynecology

## 2016-07-21 MED ORDER — PHENTERMINE HCL 37.5 MG PO TABS: 37.5000 mg | ORAL_TABLET | Freq: Every day | ORAL | 2 refills | Status: DC

## 2016-07-22 NOTE — Telephone Encounter (Signed)
ERROR

## 2016-08-11 ENCOUNTER — Ambulatory Visit (INDEPENDENT_AMBULATORY_CARE_PROVIDER_SITE_OTHER): Admitting: Obstetrics and Gynecology

## 2016-08-11 ENCOUNTER — Encounter: Payer: Self-pay | Admitting: Obstetrics and Gynecology

## 2016-08-11 VITALS — BP 128/80 | HR 83 | Wt 179.0 lb

## 2016-08-11 DIAGNOSIS — E663 Overweight: Secondary | ICD-10-CM | POA: Diagnosis not present

## 2016-08-11 MED ORDER — CYANOCOBALAMIN 1000 MCG/ML IJ SOLN
1000.0000 ug | Freq: Once | INTRAMUSCULAR | Status: AC
  Administered 2016-08-11: 1000 ug via INTRAMUSCULAR

## 2016-08-11 NOTE — Progress Notes (Signed)
Pt is here for wt, bp check and b-12 inj She is doing well, denies any s/e and is very happy with her weight loss  08/11/16 wt- 179lb 07/14/16 wt- 184lb

## 2016-08-31 ENCOUNTER — Encounter: Payer: Self-pay | Admitting: Obstetrics and Gynecology

## 2016-09-12 ENCOUNTER — Encounter: Payer: Self-pay | Admitting: Obstetrics and Gynecology

## 2016-09-12 ENCOUNTER — Ambulatory Visit (INDEPENDENT_AMBULATORY_CARE_PROVIDER_SITE_OTHER): Admitting: Obstetrics and Gynecology

## 2016-09-12 VITALS — BP 133/84 | HR 84 | Wt 169.4 lb

## 2016-09-12 DIAGNOSIS — E663 Overweight: Secondary | ICD-10-CM | POA: Diagnosis not present

## 2016-09-12 MED ORDER — CYANOCOBALAMIN 1000 MCG/ML IJ SOLN
1000.0000 ug | Freq: Once | INTRAMUSCULAR | Status: AC
Start: 2016-09-12 — End: 2016-09-12
  Administered 2016-09-12: 1000 ug via INTRAMUSCULAR

## 2016-09-12 NOTE — Progress Notes (Signed)
Pt is here for wt, bp check,b-12 inj She is doing GREAT!!! Denies any s/e  09/12/16 wt- 169.4lb 08/11/16 wt- 179lb

## 2016-09-22 ENCOUNTER — Telehealth: Payer: Self-pay | Admitting: Obstetrics and Gynecology

## 2016-09-22 NOTE — Telephone Encounter (Signed)
Patient called to speak with you regarding blood pressure issues.

## 2016-09-23 NOTE — Telephone Encounter (Signed)
Discussed her BP being elevated, advised pt to cut phentermine in half and check BP over weekend and give [place] a call on Monday

## 2016-10-10 ENCOUNTER — Encounter: Payer: Self-pay | Admitting: Obstetrics and Gynecology

## 2016-10-10 ENCOUNTER — Ambulatory Visit (INDEPENDENT_AMBULATORY_CARE_PROVIDER_SITE_OTHER): Admitting: Obstetrics and Gynecology

## 2016-10-10 VITALS — BP 141/90 | HR 108 | Wt 164.0 lb

## 2016-10-10 DIAGNOSIS — E663 Overweight: Secondary | ICD-10-CM

## 2016-10-10 MED ORDER — CYANOCOBALAMIN 1000 MCG/ML IJ SOLN
1000.0000 ug | Freq: Once | INTRAMUSCULAR | Status: AC
  Administered 2016-10-10: 1000 ug via INTRAMUSCULAR

## 2016-10-10 NOTE — Progress Notes (Signed)
Pt is here for wt, bp check, b-12 inj, pt states she stopped taking the phentermine 09/22/16, due to BP elevated.  She is planning on restarting the medication this week.  Pt also discussed she has been bleeding since Aug 12,2018.  Would like to discuss the bleeding, advised pt we would get back to her thru my chart she voiced understanding.  10/10/16 wt- 164lb 9/10/189 wt- 169lb 08/11/16 wt- 179lb

## 2016-10-14 ENCOUNTER — Telehealth: Payer: Self-pay | Admitting: Obstetrics and Gynecology

## 2016-10-14 ENCOUNTER — Ambulatory Visit: Admitting: Obstetrics and Gynecology

## 2016-10-14 NOTE — Telephone Encounter (Signed)
Patient called and stated that she is waiting for a call back from Melody or Amy, No other information was disclosed. Please advise.

## 2016-10-18 NOTE — Telephone Encounter (Signed)
appt made for 10/25/16

## 2016-10-18 NOTE — Telephone Encounter (Signed)
Bleeding could be due to weight loss, but if it is more than spotting I will need to see her and examine her.

## 2016-10-18 NOTE — Telephone Encounter (Signed)
Hey pls see note where she came in last week for nurse visit, pls advise

## 2016-10-25 ENCOUNTER — Ambulatory Visit (INDEPENDENT_AMBULATORY_CARE_PROVIDER_SITE_OTHER): Admitting: Obstetrics and Gynecology

## 2016-10-25 ENCOUNTER — Encounter: Payer: Self-pay | Admitting: Obstetrics and Gynecology

## 2016-10-25 VITALS — BP 113/76 | HR 82 | Ht 67.0 in | Wt 169.9 lb

## 2016-10-25 DIAGNOSIS — N938 Other specified abnormal uterine and vaginal bleeding: Secondary | ICD-10-CM

## 2016-10-25 NOTE — Addendum Note (Signed)
Addended by: Rosine BeatLONTZ, AMY L on: 10/25/2016 03:42 PM   Modules accepted: Orders

## 2016-10-25 NOTE — Progress Notes (Signed)
Subjective:     Patient ID: Sydney James, female   DOB: 1995-11-18, 21 y.o.   MRN: [id number]  HPI Reports onset menses August 12th, heavy most of the time, then slowed down to 'panty liner amount' the first of October. Never stopped completely. No pain. Acne flared up at start of menses in August. But resolved a week ago.   This week feels really tired, feet swelling, and gained a few pounds back. Can't sleep well. Feels nauseated. Negative UPT 3 days ago.   Review of Systems Negative except stated above.    Objective:   Physical Exam A&Ox4 Well groomed female in no distress  Blood pressure 113/76, pulse 82, height 5\' 7"  (1.702 m), weight 169 lb 14.4 oz (77.1 kg), last menstrual period 08/14/2016, not currently breastfeeding. Body mass index is 26.61 kg/m.  Pelvic exam: normal external genitalia, vulva, vagina, cervix, uterus and adnexa. UPT-    Assessment:     DUB, BTB on Depo Fatigue overweight     Plan:     Reassured. Doesn't want to do depo anymore as they desire a pregnancy. Will restart PNV and wait till one normal menses before trying for pregnancy. Labs obtained- will follow up accordingly.  Alexiss Iturralde,CNM

## 2016-10-26 LAB — VITAMIN B12: VITAMIN B 12: 547 pg/mL (ref 232–1245)

## 2016-10-26 LAB — PROGESTERONE: Progesterone: 0.2 ng/mL

## 2016-10-26 LAB — FERRITIN: Ferritin: 15 ng/mL (ref 15–150)

## 2016-10-26 LAB — FSH/LH
FSH: 4 m[IU]/mL
LH: 6.1 m[IU]/mL

## 2016-10-26 LAB — BETA HCG QUANT (REF LAB): hCG Quant: <1 m[IU]/mL

## 2016-10-26 LAB — CBC
HEMATOCRIT: 42.2 % (ref 34.0–46.6)
HEMOGLOBIN: 13.9 g/dL (ref 11.1–15.9)
MCH: 28.4 pg (ref 26.6–33.0)
MCHC: 32.9 g/dL (ref 31.5–35.7)
MCV: 86 fL (ref 79–97)
Platelets: 279 10*3/uL (ref 150–379)
RBC: 4.89 x10E6/uL (ref 3.77–5.28)
RDW: 13.4 % (ref 12.3–15.4)
WBC: 7.9 10*3/uL (ref 3.4–10.8)

## 2016-10-26 LAB — ESTRADIOL: ESTRADIOL: 29.1 pg/mL

## 2016-10-26 LAB — THYROID PANEL WITH TSH
FREE THYROXINE INDEX: 1.8 (ref 1.2–4.9)
T3 Uptake Ratio: 27 % (ref 24–39)
T4, Total: 6.8 ug/dL (ref 4.5–12.0)
TSH: 1.01 u[IU]/mL (ref 0.450–4.500)

## 2016-10-26 LAB — VITAMIN D 25 HYDROXY (VIT D DEFICIENCY, FRACTURES): VIT D 25 HYDROXY: 24.9 ng/mL — AB (ref 30.0–100.0)

## 2016-10-27 ENCOUNTER — Encounter: Payer: Self-pay | Admitting: Obstetrics and Gynecology

## 2016-10-27 LAB — URINE CULTURE

## 2016-10-28 ENCOUNTER — Other Ambulatory Visit: Payer: Self-pay | Admitting: *Deleted

## 2016-10-28 MED ORDER — FLUCONAZOLE 150 MG PO TABS: 150.0000 mg | ORAL_TABLET | Freq: Every day | ORAL | 0 refills | Status: DC

## 2016-10-28 MED ORDER — AMPICILLIN 500 MG PO CAPS: 500.0000 mg | ORAL_CAPSULE | Freq: Four times a day (QID) | ORAL | 0 refills | Status: DC

## 2016-11-01 ENCOUNTER — Other Ambulatory Visit: Payer: Self-pay

## 2016-11-01 ENCOUNTER — Emergency Department

## 2016-11-01 ENCOUNTER — Encounter: Payer: Self-pay | Admitting: Emergency Medicine

## 2016-11-01 ENCOUNTER — Encounter: Payer: Self-pay | Admitting: *Deleted

## 2016-11-01 ENCOUNTER — Emergency Department
Admission: EM | Admit: 2016-11-01 | Discharge: 2016-11-01 | Disposition: A | Discharge status: Home / Self Care | Source: Home / Self Care

## 2016-11-01 ENCOUNTER — Emergency Department
Admission: EM | Admit: 2016-11-01 | Discharge: 2016-11-01 | Disposition: A | Discharge status: Home / Self Care | Attending: Emergency Medicine | Admitting: Emergency Medicine

## 2016-11-01 DIAGNOSIS — Z5321 Procedure and treatment not carried out due to patient leaving prior to being seen by health care provider: Secondary | ICD-10-CM

## 2016-11-01 DIAGNOSIS — R079 Chest pain, unspecified: Secondary | ICD-10-CM | POA: Diagnosis present

## 2016-11-01 DIAGNOSIS — K219 Gastro-esophageal reflux disease without esophagitis: Secondary | ICD-10-CM | POA: Diagnosis not present

## 2016-11-01 LAB — CBC
HCT: 40 % (ref 35.0–47.0)
HEMOGLOBIN: 13.5 g/dL (ref 12.0–16.0)
MCH: 29.2 pg (ref 26.0–34.0)
MCHC: 33.8 g/dL (ref 32.0–36.0)
MCV: 86.2 fL (ref 80.0–100.0)
PLATELETS: 243 10*3/uL (ref 150–440)
RBC: 4.64 MIL/uL (ref 3.80–5.20)
RDW: 13.4 % (ref 11.5–14.5)
WBC: 9.9 10*3/uL (ref 3.6–11.0)

## 2016-11-01 LAB — BASIC METABOLIC PANEL
Anion gap: 7 (ref 5–15)
BUN: 10 mg/dL (ref 6–20)
CALCIUM: 9 mg/dL (ref 8.9–10.3)
CHLORIDE: 104 mmol/L (ref 101–111)
CO2: 27 mmol/L (ref 22–32)
Creatinine, Ser: 0.77 mg/dL (ref 0.44–1.00)
Glucose, Bld: 105 mg/dL — ABNORMAL HIGH (ref 65–99)
Potassium: 3.5 mmol/L (ref 3.5–5.1)
SODIUM: 138 mmol/L (ref 135–145)

## 2016-11-01 LAB — POCT PREGNANCY, URINE: PREG TEST UR: NEGATIVE

## 2016-11-01 LAB — TROPONIN I

## 2016-11-01 MED ORDER — FAMOTIDINE 20 MG PO TABS: 20.0000 mg | ORAL_TABLET | Freq: Two times a day (BID) | ORAL | 1 refills | Status: DC

## 2016-11-01 MED ORDER — ALUM & MAG HYDROXIDE-SIMETH 400-400-40 MG/5ML PO SUSP
5.0000 mL | Freq: Four times a day (QID) | ORAL | 0 refills | Status: DC | PRN
Start: 2016-11-01 — End: 2017-02-06

## 2016-11-01 NOTE — ED Triage Notes (Signed)
Patient ambulatory to triage with steady gait, without difficulty or distress noted; pt reports mid CP radiating into back x "20 months" accomp by dizziness

## 2016-11-01 NOTE — ED Notes (Signed)
Vikki PortsValerie RN aware of placement in Room 9.

## 2016-11-01 NOTE — ED Provider Notes (Signed)
[facility] Emergency Department Provider Note  ____________________________________________  Time seen: Approximately 11:11 AM  I have reviewed the triage vital signs and the nursing notes.   HISTORY  Chief Complaint Chest Pain   HPI Sydney James is a 21 y.o. female no significant past medical history who presents for evaluation of chest pain. Patient reports that she's been having this chest pain for 20 months since having her son. She reports that the pain is sharp, located in the center of her chest, comes on 2-3 times a day, lasts 10-15 seconds and resolves without intervention. Yesterday evening she had a more severe episode that lasted for several hours which prompted her visit to the emergency room. She had a chest x-ray, EKG, and lab work done however left without being seen from the waiting room due to the wait time. She has no pain at this time but returned to finish her evaluation.Patient reports that the pain is usually worse at nighttime. She has no shortness of breath, no fever or chills, no nausea or vomiting, no diarrhea, no melena, no hemoptysis, no cough or congestion, no abdominal pain, no dysuria or hematuria. No personal or family history blood clots, no recent travel or immobilization, no leg pain or swelling, no exogenous hormones.  Past Medical History:  Diagnosis Date  . Migraine   . Preeclampsia   . Scoliosis    Back brace for 2 years  . Scoliosis     There are no active problems to display for this patient.   Past Surgical History:  Procedure Laterality Date  . DILATION AND EVACUATION N/A 03/15/2016   Procedure: DILATATION AND EVACUATION;  Surgeon: Herold HarmsMartin A Defrancesco, MD;  Location: [facility] ORS;  Service: Gynecology;  Laterality: N/A;    Prior to Admission medications   Medication Sig Start Date End Date Taking? Authorizing Provider  alum & mag hydroxide-simeth (MAALOX MAX) 400-400-40 MG/5ML suspension Take 5 mLs by mouth  every 6 (six) hours as needed for indigestion. 11/01/16   Nita SickleVeronese, [place], MD  ampicillin (PRINCIPEN) 500 MG capsule Take 1 capsule (500 mg total) by mouth 4 (four) times daily. 10/28/16   Lawhorn, Vanessa DurhamJenkins Michelle, CNM  cyanocobalamin (,VITAMIN B-12,) 1000 MCG/ML injection Inject 1 mL (1,000 mcg total) into the muscle every 30 (thirty) days. Patient not taking: Reported on 10/25/2016 07/13/16   Shambley, Melody N, CNM  famotidine (PEPCID) 20 MG tablet Take 1 tablet (20 mg total) by mouth 2 (two) times daily. 11/01/16 11/01/17  Nita SickleVeronese, [place], MD  fluconazole (DIFLUCAN) 150 MG tablet Take 1 tablet (150 mg total) by mouth daily. 10/28/16   Gunnar BullaLawhorn, Jenkins Michelle, CNM    Allergies Aloe; Lubricants; and Tape  Family History  Problem Relation Age of Onset  . Hypertension Father   . Hypertension Mother     Social History Social History  Substance Use Topics  . Smoking status: Never Smoker  . Smokeless tobacco: Never Used  . Alcohol use No    Review of Systems  Constitutional: Negative for fever. Eyes: Negative for visual changes. ENT: Negative for sore throat. Neck: No neck pain  Cardiovascular: + chest pain. Respiratory: Negative for shortness of breath. Gastrointestinal: Negative for abdominal pain, vomiting or diarrhea. Genitourinary: Negative for dysuria. Musculoskeletal: Negative for back pain. Skin: Negative for rash. Neurological: Negative for headaches, weakness or numbness. Psych: No SI or HI  ____________________________________________   PHYSICAL EXAM:  VITAL SIGNS: ED Triage Vitals  Enc Vitals Group     BP 11/01/16 1014  131/83     Pulse Rate 11/01/16 1014 90     Resp 11/01/16 1014 18     Temp 11/01/16 1014 98.7 F (37.1 C)     Temp Source 11/01/16 1014 Oral     SpO2 11/01/16 1014 98 %     Weight 11/01/16 1012 169 lb (76.7 kg)     Height 11/01/16 1012 5\' 7"  (1.702 m)     Head Circumference --      Peak Flow --      Pain Score 11/01/16 1011 8       Pain Loc --      Pain Edu? --      Excl. in GC? --     Constitutional: Alert and oriented. Well appearing and in no apparent distress. HEENT:      Head: Normocephalic and atraumatic.         Eyes: Conjunctivae are normal. Sclera is non-icteric.       Mouth/Throat: Mucous membranes are moist.       Neck: Supple with no signs of meningismus. Cardiovascular: Regular rate and rhythm. No murmurs, gallops, or rubs. 2+ symmetrical distal pulses are present in all extremities. No JVD. Respiratory: Normal respiratory effort. Lungs are clear to auscultation bilaterally. No wheezes, crackles, or rhonchi.  Gastrointestinal: Soft, non tender, and non distended with positive bowel sounds. No rebound or guarding. Genitourinary: No CVA tenderness. Musculoskeletal: Nontender with normal range of motion in all extremities. No edema, cyanosis, or erythema of extremities. Neurologic: Normal speech and language. Face is symmetric. Moving all extremities. No gross focal neurologic deficits are appreciated. Skin: Skin is warm, dry and intact. No rash noted. Psychiatric: Mood and affect are normal. Speech and behavior are normal.  ____________________________________________   LABS (all labs ordered are listed, but only abnormal results are displayed)  Labs Reviewed  POC URINE PREG, ED  POCT PREGNANCY, URINE   ____________________________________________  EKG  ED ECG REPORT I, Nita Sickle, the attending physician, personally viewed and interpreted this ECG.  Normal sinus rhythm, rate of 93, normal intervals, normal axis, no ST elevations or depressions. Unchanged from prior from this morning ____________________________________________  RADIOLOGY  CXR:  1.  No acute pulmonary process. 2. Mild increase in thoracic scoliosis from remote prior exam.   ____________________________________________   PROCEDURES  Procedure(s) performed: None Procedures Critical Care performed:   None ____________________________________________   INITIAL IMPRESSION / ASSESSMENT AND PLAN / ED COURSE  21 y.o. female no significant past medical history who presents for evaluation of atypical chest pain for 20 months. Patient is in no distress and has no pain at this time. Her EKG is unchanged from the one this morning. Patient had labs done 11 hours ago which showed negative troponin, BNP, CBC, pregnancy test, and chest x-ray. Do not believe patient needs any further imaging or lab work at this time since her symptoms have been ongoing for 20 months. The fact that they're worse at night time makes me wonder if patient has peptic ulcer disease or gastritis. I will start her on Pepcid daily and Maalox as needed and I will refer her to see a GI specialist. Heart score of 0. PERC negative. No abdominal tenderness on exam.      As part of my medical decision making, I reviewed the following data within the electronic MEDICAL RECORD NUMBER Nursing notes reviewed and incorporated, Labs reviewed , EKG interpreted , Old EKG reviewed, Old chart reviewed, Radiograph reviewed , Notes from prior ED visits and  [place] Controlled Substance Database    Pertinent labs & imaging results that were available during my care of the patient were reviewed by me and considered in my medical decision making (see chart for details).    ____________________________________________   FINAL CLINICAL IMPRESSION(S) / ED DIAGNOSES  Final diagnoses:  Chest pain, unspecified type  Gastroesophageal reflux disease, esophagitis presence not specified      NEW MEDICATIONS STARTED DURING THIS VISIT:  New Prescriptions   ALUM & MAG HYDROXIDE-SIMETH (MAALOX MAX) 400-400-40 MG/5ML SUSPENSION    Take 5 mLs by mouth every 6 (six) hours as needed for indigestion.   FAMOTIDINE (PEPCID) 20 MG TABLET    Take 1 tablet (20 mg total) by mouth 2 (two) times daily.     Note:  This document was prepared using Dragon voice recognition  software and may include unintentional dictation errors.    Nita Sickle, MD 11/01/16 [phone]

## 2016-11-01 NOTE — Discharge Instructions (Signed)

## 2016-11-01 NOTE — ED Triage Notes (Signed)
States mid chest pressure for 20 months that seems to be getting worse, states she LWBS yesterday in this ED, awake and alert in no acute distress

## 2016-11-02 ENCOUNTER — Other Ambulatory Visit: Payer: Self-pay | Admitting: Obstetrics and Gynecology

## 2016-11-02 DIAGNOSIS — E559 Vitamin D deficiency, unspecified: Secondary | ICD-10-CM | POA: Insufficient documentation

## 2016-11-02 MED ORDER — VITAMIN D (ERGOCALCIFEROL) 1.25 MG (50000 UNIT) PO CAPS: 50000.0000 [IU] | ORAL_CAPSULE | ORAL | 1 refills | Status: DC

## 2016-11-09 ENCOUNTER — Encounter: Payer: Self-pay | Admitting: Obstetrics and Gynecology

## 2017-01-05 ENCOUNTER — Encounter: Payer: Self-pay | Admitting: Obstetrics and Gynecology

## 2017-01-06 ENCOUNTER — Ambulatory Visit (INDEPENDENT_AMBULATORY_CARE_PROVIDER_SITE_OTHER): Admitting: Obstetrics and Gynecology

## 2017-01-06 ENCOUNTER — Encounter: Payer: Self-pay | Admitting: Obstetrics and Gynecology

## 2017-01-06 VITALS — BP 127/89 | HR 86 | Ht 67.0 in | Wt 181.6 lb

## 2017-01-06 DIAGNOSIS — E663 Overweight: Secondary | ICD-10-CM

## 2017-01-06 MED ORDER — CYANOCOBALAMIN 1000 MCG/ML IJ SOLN: 1000.0000 ug | INTRAMUSCULAR | 1 refills | Status: DC

## 2017-01-06 MED ORDER — PHENTERMINE HCL 37.5 MG PO TABS: 37.5000 mg | ORAL_TABLET | Freq: Every day | ORAL | 2 refills | Status: DC

## 2017-01-06 NOTE — Progress Notes (Signed)
SUBJECTIVE:  22 y.o. here for follow-up weight loss visit, previously seen 13 weeks ago. Denies any concerns and desires to restart medications for weight loss as she has regained 18#s. It worked well for her in the past OBJECTIVE:  BP 127/89   Pulse 86   Ht 5\' 7"  (1.702 m)   Wt 181 lb 9.6 oz (82.4 kg)   LMP 12/25/2016   BMI 28.44 kg/m   Body mass index is 28.44 kg/m. Patient appears well. ASSESSMENT:  Overweigth PLAN:  To restart medications. B12 105700mcg/ml injection given RTC in 4 weeks as planned  Melody SandersvilleShambley, CNM

## 2017-01-20 ENCOUNTER — Encounter: Admitting: Certified Nurse Midwife

## 2017-01-23 ENCOUNTER — Ambulatory Visit (INDEPENDENT_AMBULATORY_CARE_PROVIDER_SITE_OTHER): Admitting: Certified Nurse Midwife

## 2017-01-23 ENCOUNTER — Encounter: Payer: Self-pay | Admitting: Certified Nurse Midwife

## 2017-01-23 VITALS — BP 136/87 | HR 76 | Ht 67.0 in | Wt 178.7 lb

## 2017-01-23 DIAGNOSIS — N898 Other specified noninflammatory disorders of vagina: Secondary | ICD-10-CM | POA: Diagnosis not present

## 2017-01-23 MED ORDER — FLUCONAZOLE 150 MG PO TABS: 150.0000 mg | ORAL_TABLET | Freq: Once | ORAL | 0 refills | Status: AC

## 2017-01-23 NOTE — Progress Notes (Signed)
GYN ENCOUNTER NOTE  Subjective:       Sydney James is a 22 y.o. 712P0100 female is here for gynecologic evaluation of the following issues:  1. Increased vaginal discharge with an  odor and vaginal pain  That started last month right before her period. It increases after intercourse. She denies burning and fever. She denies any knew partners and potential for STD, she declines STD testing. She is unable to use monistat due to allergy.    Gynecologic History Patient's last menstrual period was 01/07/2016.   Obstetric History OB History  Gravida Para Term Preterm AB Living  2 1   1    0  SAB TAB Ectopic Multiple Live Births        0      # Outcome Date GA Lbr Len/2nd Weight Sex Delivery Anes PTL Lv  2 Gravida           1 Preterm 01/25/15 [id number] 03:30 / 01:45 6 lb 4.2 oz (2.84 kg) M Vag-Spont EPI        Past Medical History:  Diagnosis Date  . Migraine   . Preeclampsia   . Scoliosis    Back brace for 2 years  . Scoliosis     Past Surgical History:  Procedure Laterality Date  . DILATION AND EVACUATION N/A 03/15/2016   Procedure: DILATATION AND EVACUATION;  Surgeon: Herold HarmsMartin A Defrancesco, MD;  Location: [facility];  Service: Gynecology;  Laterality: N/A;    Current Outpatient Medications on File Prior to Visit  Medication Sig Dispense Refill  . phentermine (ADIPEX-P) 37.5 MG tablet Take 1 tablet (37.5 mg total) by mouth daily before breakfast. 30 tablet 2  . alum & mag hydroxide-simeth (MAALOX MAX) 400-400-40 MG/5ML suspension Take 5 mLs by mouth every 6 (six) hours as needed for indigestion. (Patient not taking: Reported on 01/06/2017) 355 mL 0  . cyanocobalamin (,VITAMIN B-12,) 1000 MCG/ML injection Inject 1 mL (1,000 mcg total) into the muscle every 30 (thirty) days. (Patient not taking: Reported on 01/23/2017) 10 mL 1  . famotidine (PEPCID) 20 MG tablet Take 1 tablet (20 mg total) by mouth 2 (two) times daily. (Patient not taking: Reported on 01/06/2017) 60 tablet 1  . fluconazole  (DIFLUCAN) 150 MG tablet Take 1 tablet (150 mg total) by mouth daily. (Patient not taking: Reported on 01/06/2017) 2 tablet 0  . Vitamin D, Ergocalciferol, (DRISDOL) 50000 units CAPS capsule Take 1 capsule (50,000 Units total) by mouth 2 (two) times a week. (Patient not taking: Reported on 01/23/2017) 30 capsule 1   No current facility-administered medications on file prior to visit.     Allergies  Allergen Reactions  . Aloe Other (See Comments)    Unknown reaction  . Lubricants     Has to have water based lubricant  . Tape Itching and Rash    Social History   Socioeconomic History  . Marital status: Married    Spouse name: Not on file  . Number of children: Not on file  . Years of education: Not on file  . Highest education level: Not on file  Social Needs  . Financial resource strain: Not on file  . Food insecurity - worry: Not on file  . Food insecurity - inability: Not on file  . Transportation needs - medical: Not on file  . Transportation needs - non-medical: Not on file  Occupational History  . Not on file  Tobacco Use  . Smoking status: Never Smoker  . Smokeless tobacco: Never Used  Substance and Sexual Activity  . Alcohol use: No  . Drug use: No  . Sexual activity: Yes  Other Topics Concern  . Not on file  Social History Narrative  . Not on file    Family History  Problem Relation Age of Onset  . Hypertension Father   . Hypertension Mother     The following portions of the patient's history were reviewed and updated as appropriate: allergies, current medications, past family history, past medical history, past social history, past surgical history and problem list.  Review of Systems Review of Systems - Negative except as mentioned in HPI Review of Systems - General ROS: negative for - chills, fatigue, fever, hot flashes, malaise or night sweats Hematological and Lymphatic ROS: negative for - bleeding problems or swollen lymph nodes Gastrointestinal ROS:  negative for - abdominal pain, blood in stools, change in bowel habits and nausea/vomiting Musculoskeletal ROS: negative for - joint pain, muscle pain or muscular weakness Genito-Urinary ROS: negative for - change in menstrual cycle, dysmenorrhea, dyspareunia, dysuria,genital ulcers, hematuria, incontinence, irregular/heavy menses, nocturia or pelvic pain. Positive:  genital discharge   Objective:   BP 136/87 (BP Location: Right Arm, Patient Position: Sitting, Cuff Size: Normal)   Pulse 76   Ht 5\' 7"  (1.702 m)   Wt 178 lb 11.2 oz (81.1 kg)   LMP 01/07/2016   BMI 27.99 kg/m  CONSTITUTIONAL: Well-developed, well-nourished female in no acute distress.  HENT:  Normocephalic, atraumatic.  NECK: Normal range of motion, supple, SKIN: Skin is warm and dry. No rash noted. Not diaphoretic. No erythema. No pallor. NEUROLGIC: Alert and oriented to person, place, and time.  PSYCHIATRIC: Normal mood and affect. Normal behavior. Normal judgment and thought content. CARDIOVASCULAR:Not Examined RESPIRATORY: Not Examined BREASTS: Not Examined ABDOMEN: Soft, non distended; Non tender.  No Organomegaly. PELVIC:  External Genitalia: Normal  BUS: Normal  Vagina: Normal, white discharge no odor noted, no redness or signs of any lesions   Cervix: Normal MUSCULOSKELETAL: Normal range of motion. No tenderness.  No cyanosis, clubbing, or edema.   Assessment:  Vaginal discharge   Plan:   Nuswab today, discussed use of boric acid during period, after intercourse , after antibiitc use to prevent vaginal infections. Pt verbalizes understanding. Diflucan ordered. Will follow up with results.   Doreene Burke, CNM

## 2017-01-23 NOTE — Addendum Note (Signed)
Addended by: Garfield CorneaMABRY, JASMINE L on: 01/23/2017 11:54 AM   Modules accepted: Orders

## 2017-01-23 NOTE — Patient Instructions (Signed)

## 2017-01-25 ENCOUNTER — Encounter: Payer: Self-pay | Admitting: Certified Nurse Midwife

## 2017-01-25 LAB — NUSWAB VG+, CANDIDA 6SP
CANDIDA KRUSEI, NAA: NEGATIVE
CHLAMYDIA TRACHOMATIS, NAA: NEGATIVE
Candida albicans, NAA: NEGATIVE
Candida glabrata, NAA: NEGATIVE
Candida lusitaniae, NAA: NEGATIVE
Candida parapsilosis, NAA: NEGATIVE
Candida tropicalis, NAA: NEGATIVE
NEISSERIA GONORRHOEAE, NAA: NEGATIVE
TRICH VAG BY NAA: NEGATIVE

## 2017-01-31 ENCOUNTER — Encounter: Payer: Self-pay | Admitting: Obstetrics and Gynecology

## 2017-02-01 ENCOUNTER — Other Ambulatory Visit

## 2017-02-01 ENCOUNTER — Encounter: Admitting: Certified Nurse Midwife

## 2017-02-06 ENCOUNTER — Encounter: Payer: Self-pay | Admitting: Certified Nurse Midwife

## 2017-02-06 ENCOUNTER — Other Ambulatory Visit

## 2017-02-06 ENCOUNTER — Ambulatory Visit (INDEPENDENT_AMBULATORY_CARE_PROVIDER_SITE_OTHER): Admitting: Certified Nurse Midwife

## 2017-02-06 ENCOUNTER — Ambulatory Visit: Admitting: Obstetrics and Gynecology

## 2017-02-06 VITALS — BP 125/82 | HR 92 | Wt 183.3 lb

## 2017-02-06 DIAGNOSIS — N939 Abnormal uterine and vaginal bleeding, unspecified: Secondary | ICD-10-CM

## 2017-02-06 MED ORDER — TRANEXAMIC ACID 650 MG PO TABS: 1300.0000 mg | ORAL_TABLET | Freq: Three times a day (TID) | ORAL | 0 refills | Status: AC

## 2017-02-06 NOTE — Progress Notes (Addendum)
GYN ENCOUNTER NOTE  Subjective:       Sydney James is a 22 y.o. G40P0100 female is here for gynecologic evaluation of the following issues:  1. Abnormal uterine bleeding. She states that she has had abnormal bleeding since having her D&C in  March after her miscarriage and using the depo provera injection in July. In November her cycle was 17-27, December 23-30 th , and in January she has bleed from the 4th-10th again on the 22nd-27th. She states she has spotting in between. She has associated symptoms of feeling light headed , dizzy, exhausted, nausea and vomiting x 1 in January. She states that she had nausea with pregnancy and thought it she maybe. She did a urine test that was negative. She has been trying to get pregnant since the summer.  Today she states she is spotting. She denies the bleeding being heavy, stating that she can wear panty liner all day on the days she is spotting without needing to change it.    Gynecologic History Patient's last menstrual period was 01/17/2017. Contraception: none /Last Pap:  07/2015 Results were: normal Last mammogram: n/a.  Obstetric History OB History  Gravida Para Term Preterm AB Living  2 1   1    0  SAB TAB Ectopic Multiple Live Births        0      # Outcome Date GA Lbr Len/2nd Weight Sex Delivery Anes PTL Lv  2 Gravida           1 Preterm 01/25/15 [redacted]w[redacted]d 03:30 / 01:45 6 lb 4.2 oz (2.84 kg) M Vag-Spont EPI        Past Medical History:  Diagnosis Date  . Migraine   . Preeclampsia   . Scoliosis    Back brace for 2 years  . Scoliosis     Past Surgical History:  Procedure Laterality Date  . DILATION AND EVACUATION N/A 03/15/2016   Procedure: DILATATION AND EVACUATION;  Surgeon: Herold Harms, MD;  Location: [facility];  Service: Gynecology;  Laterality: N/A;    Current Outpatient Medications on File Prior to Visit  Medication Sig Dispense Refill  . Vitamin D, Ergocalciferol, (DRISDOL) 50000 units CAPS capsule Take 1 capsule  (50,000 Units total) by mouth 2 (two) times a week. 30 capsule 1   No current facility-administered medications on file prior to visit.     Allergies  Allergen Reactions  . Aloe Other (See Comments)    Unknown reaction  . Lubricants     Has to have water based lubricant  . Tape Itching and Rash    Social History   Socioeconomic History  . Marital status: Married    Spouse name: Not on file  . Number of children: Not on file  . Years of education: Not on file  . Highest education level: Not on file  Social Needs  . Financial resource strain: Not on file  . Food insecurity - worry: Not on file  . Food insecurity - inability: Not on file  . Transportation needs - medical: Not on file  . Transportation needs - non-medical: Not on file  Occupational History  . Not on file  Tobacco Use  . Smoking status: Never Smoker  . Smokeless tobacco: Never Used  Substance and Sexual Activity  . Alcohol use: No  . Drug use: No  . Sexual activity: Yes    Birth control/protection: None  Other Topics Concern  . Not on file  Social History Narrative  .  Not on file    Family History  Problem Relation Age of Onset  . Hypertension Father   . Hypertension Mother     The following portions of the patient's history were reviewed and updated as appropriate: allergies, current medications, past family history, past medical history, past social history, past surgical history and problem list.  Review of Systems Review of Systems - Negative except as mentioned in HPI Review of Systems - General ROS: negative for - chills, fatigue, fever, hot flashes, night sweats. Positive :  malaise Hematological and Lymphatic ROS: negative for - bleeding problems or swollen lymph nodes Gastrointestinal ROS: negative for - abdominal pain, blood in stools, change in bowel habits and nausea/vomiting Musculoskeletal ROS: negative for - joint pain, muscle pain or muscular weakness Genito-Urinary ROS: negative  for - , dysmenorrhea, dyspareunia, dysuria, genital discharge, genital ulcers, hematuria, incontinence, heavy menses, nocturia or pelvic pain. Positive for  change in menstrual cycle / irregular  Objective:   BP 125/82   Pulse 92   Wt 183 lb 4.8 oz (83.1 kg)   LMP 01/17/2017 Comment: irregular  BMI 28.71 kg/m  CONSTITUTIONAL: Well-developed, well-nourished female in no acute distress.  HENT:  Normocephalic, atraumatic.  NECK: Normal range of motion  SKIN: Skin is warm and dry. No rash noted. Not diaphoretic. No erythema. No pallor. NEUROLGIC: Alert and oriented to person, place, and time.  PSYCHIATRIC: Normal mood and affect. Normal behavior. Normal judgment and thought content. CARDIOVASCULAR:Not Examined RESPIRATORY: Not Examined BREASTS: Not Examined ABDOMEN: Soft, non distended; Non tender.  No Organomegaly. PELVIC:  External Genitalia: Normal  BUS: Normal  Vagina: Normal, light amount of blood   Cervix: Normal, friable, ectropion present   Uterus: Normal size, shape,consistency, mobile  Adnexa: Normal  RV: Normal   Bladder: Nontender MUSCULOSKELETAL: Normal range of motion. No tenderness.  No cyanosis, clubbing, or edema.    Assessment:   1. Abnormal uterine bleeding - TSH - CBC - Beta HCG, Quant - Ferritin - Testosterone - US PELVIS (TRANSABDOMINAL ONLY); Future     Plan:   Discussed use of lysteda for prolonged bleeding. Order placed with instructions on use.  Labs and ultrasound ordered. Will follow up with results.   Doreene BurkeAnnie Ellanore Vanhook, CNM

## 2017-02-06 NOTE — Progress Notes (Signed)
PT feeling tired, n/v, no energy to do anything.

## 2017-02-06 NOTE — Patient Instructions (Signed)

## 2017-02-07 ENCOUNTER — Encounter: Payer: Self-pay | Admitting: Certified Nurse Midwife

## 2017-02-07 LAB — TSH: TSH: 1.3 u[IU]/mL (ref 0.450–4.500)

## 2017-02-07 LAB — CBC
Hematocrit: 41.5 % (ref 34.0–46.6)
Hemoglobin: 14.2 g/dL (ref 11.1–15.9)
MCH: 28.8 pg (ref 26.6–33.0)
MCHC: 34.2 g/dL (ref 31.5–35.7)
MCV: 84 fL (ref 79–97)
Platelets: 319 10*3/uL (ref 150–379)
RBC: 4.93 x10E6/uL (ref 3.77–5.28)
RDW: 13.4 % (ref 12.3–15.4)
WBC: 7.8 10*3/uL (ref 3.4–10.8)

## 2017-02-07 LAB — PROLACTIN: Prolactin: 17.1 ng/mL (ref 4.8–23.3)

## 2017-02-07 LAB — BETA HCG QUANT (REF LAB)

## 2017-02-07 LAB — TESTOSTERONE: Testosterone: 12 ng/dL (ref 8–48)

## 2017-02-07 LAB — FERRITIN: Ferritin: 22 ng/mL (ref 15–150)

## 2017-02-08 ENCOUNTER — Ambulatory Visit (INDEPENDENT_AMBULATORY_CARE_PROVIDER_SITE_OTHER)

## 2017-02-08 DIAGNOSIS — N939 Abnormal uterine and vaginal bleeding, unspecified: Secondary | ICD-10-CM | POA: Diagnosis not present

## 2017-02-13 ENCOUNTER — Encounter: Payer: Self-pay | Admitting: Certified Nurse Midwife

## 2017-03-28 ENCOUNTER — Other Ambulatory Visit: Payer: Self-pay | Admitting: Family Medicine

## 2017-03-28 DIAGNOSIS — R079 Chest pain, unspecified: Secondary | ICD-10-CM

## 2017-03-29 ENCOUNTER — Other Ambulatory Visit: Payer: Self-pay | Admitting: Sports Medicine

## 2017-03-29 DIAGNOSIS — M41125 Adolescent idiopathic scoliosis, thoracolumbar region: Secondary | ICD-10-CM

## 2017-03-30 ENCOUNTER — Ambulatory Visit
Admission: RE | Admit: 2017-03-30 | Discharge: 2017-03-30 | Disposition: A | Discharge status: Home / Self Care | Source: Ambulatory Visit | Attending: Sports Medicine | Admitting: Sports Medicine

## 2017-03-30 ENCOUNTER — Other Ambulatory Visit: Payer: Self-pay | Admitting: Sports Medicine

## 2017-03-30 DIAGNOSIS — M41125 Adolescent idiopathic scoliosis, thoracolumbar region: Secondary | ICD-10-CM | POA: Insufficient documentation

## 2017-04-05 ENCOUNTER — Other Ambulatory Visit: Payer: Self-pay | Admitting: Student

## 2017-04-05 DIAGNOSIS — M5416 Radiculopathy, lumbar region: Secondary | ICD-10-CM

## 2017-04-14 ENCOUNTER — Ambulatory Visit
Admission: RE | Admit: 2017-04-14 | Discharge: 2017-04-14 | Disposition: A | Discharge status: Home / Self Care | Source: Ambulatory Visit | Attending: Student | Admitting: Student

## 2017-04-19 ENCOUNTER — Ambulatory Visit
Admission: RE | Admit: 2017-04-19 | Discharge: 2017-04-19 | Disposition: A | Discharge status: Home / Self Care | Source: Ambulatory Visit | Attending: Student | Admitting: Student

## 2017-04-19 DIAGNOSIS — M5136 Other intervertebral disc degeneration, lumbar region: Secondary | ICD-10-CM | POA: Insufficient documentation

## 2017-04-19 DIAGNOSIS — M5126 Other intervertebral disc displacement, lumbar region: Secondary | ICD-10-CM | POA: Insufficient documentation

## 2017-04-19 DIAGNOSIS — M5416 Radiculopathy, lumbar region: Secondary | ICD-10-CM | POA: Diagnosis present

## 2017-05-23 ENCOUNTER — Ambulatory Visit (INDEPENDENT_AMBULATORY_CARE_PROVIDER_SITE_OTHER): Admitting: Certified Nurse Midwife

## 2017-05-23 ENCOUNTER — Encounter: Payer: Self-pay | Admitting: Certified Nurse Midwife

## 2017-05-23 VITALS — BP 123/84 | HR 91 | Ht 67.0 in | Wt 197.2 lb

## 2017-05-23 DIAGNOSIS — N898 Other specified noninflammatory disorders of vagina: Secondary | ICD-10-CM

## 2017-05-23 DIAGNOSIS — N9089 Other specified noninflammatory disorders of vulva and perineum: Secondary | ICD-10-CM | POA: Diagnosis not present

## 2017-05-23 MED ORDER — FLUCONAZOLE 150 MG PO TABS: 150.0000 mg | ORAL_TABLET | Freq: Once | ORAL | 0 refills | Status: AC

## 2017-05-23 NOTE — Patient Instructions (Signed)
Vaginal Yeast infection, Adult Vaginal yeast infection is a condition that causes soreness, swelling, and redness (inflammation) of the vagina. It also causes vaginal discharge. This is a common condition. Some women get this infection frequently. What are the causes? This condition is caused by a change in the normal balance of the yeast (candida) and bacteria that live in the vagina. This change causes an overgrowth of yeast, which causes the inflammation. What increases the risk? This condition is more likely to develop in:  Women who take antibiotic medicines.  Women who have diabetes.  Women who take birth control pills.  Women who are pregnant.  Women who douche often.  Women who have a weak defense (immune) system.  Women who have been taking steroid medicines for a long time.  Women who frequently wear tight clothing.  What are the signs or symptoms? Symptoms of this condition include:  White, thick vaginal discharge.  Swelling, itching, redness, and irritation of the vagina. The lips of the vagina (vulva) may be affected as well.  Pain or a burning feeling while urinating.  Pain during sex.  How is this diagnosed? This condition is diagnosed with a medical history and physical exam. This will include a pelvic exam. Your health care provider will examine a sample of your vaginal discharge under a microscope. Your health care provider may send this sample for testing to confirm the diagnosis. How is this treated? This condition is treated with medicine. Medicines may be over-the-counter or prescription. You may be told to use one or more of the following:  Medicine that is taken orally.  Medicine that is applied as a cream.  Medicine that is inserted directly into the vagina (suppository).  Follow these instructions at home:  Take or apply over-the-counter and prescription medicines only as told by your health care provider.  Do not have sex until your health  care provider has approved. Tell your sex partner that you have a yeast infection. That person should go to his or her health care provider if he or she develops symptoms.  Do not wear tight clothes, such as pantyhose or tight pants.  Avoid using tampons until your health care provider approves.  Eat more yogurt. This may help to keep your yeast infection from returning.  Try taking a sitz bath to help with discomfort. This is a warm water bath that is taken while you are sitting down. The water should only come up to your hips and should cover your buttocks. Do this 3-4 times per day or as told by your health care provider.  Do not douche.  Wear breathable, cotton underwear.  If you have diabetes, keep your blood sugar levels under control. Contact a health care provider if:  You have a fever.  Your symptoms go away and then return.  Your symptoms do not get better with treatment.  Your symptoms get worse.  You have new symptoms.  You develop blisters in or around your vagina.  You have blood coming from your vagina and it is not your menstrual period.  You develop pain in your abdomen. This information is not intended to replace advice given to you by your health care provider. Make sure you discuss any questions you have with your health care provider. Document Released: 09/29/2004 Document Revised: 06/03/2015 Document Reviewed: 06/23/2014 Elsevier Interactive Patient Education  2018 [employer]. Common Medications Safe in Pregnancy  Acne:      Constipation:  Benzoyl Peroxide     Colace  Clindamycin      Dulcolax Suppository  Topica Erythromycin     Fibercon  Salicylic Acid      Metamucil         Miralax AVOID:        Senakot   Accutane    Cough:  Retin-A       Cough Drops  Tetracycline      Phenergan w/ Codeine if Rx  Minocycline      Robitussin (Plain &  DM)  Antibiotics:     Crabs/Lice:  Ceclor       RID  Cephalosporins    AVOID:  E-Mycins      Kwell  Keflex  Macrobid/Macrodantin   Diarrhea:  Penicillin      Kao-Pectate  Zithromax      Imodium AD         PUSH FLUIDS AVOID:       Cipro     Fever:  Tetracycline      Tylenol (Regular or Extra  Minocycline       Strength)  Levaquin      Extra Strength-Do not          Exceed 8 tabs/24 hrs Caffeine:        <23m/day (equiv. To 1 cup of coffee or  approx. 3 12 oz sodas)         Gas: Cold/Hayfever:       Gas-X  Benadryl      Mylicon  Claritin       Phazyme  **Claritin-D        Chlor-Trimeton    Headaches:  Dimetapp      ASA-Free Excedrin  Drixoral-Non-Drowsy     Cold Compress  Mucinex (Guaifenasin)     Tylenol (Regular or Extra  Sudafed/Sudafed-12 Hour     Strength)  **Sudafed PE Pseudoephedrine   Tylenol Cold & Sinus     Vicks Vapor Rub  Zyrtec  **AVOID if Problems With Blood Pressure         Heartburn: Avoid lying down for at least 1 hour after meals  Aciphex      Maalox     Rash:  Milk of Magnesia     Benadryl    Mylanta       1% Hydrocortisone Cream  Pepcid  Pepcid Complete   Sleep Aids:  Prevacid      Ambien   Prilosec       Benadryl  Rolaids       Chamomile Tea  Tums (Limit 4/day)     Unisom  Zantac       Tylenol PM         Warm milk-add vanilla or  Hemorrhoids:       Sugar for taste  Anusol/Anusol H.C.  (RX: Analapram 2.5%)  Sugar Substitutes:  Hydrocortisone OTC     Ok in moderation  Preparation H      Tucks        Vaseline lotion applied to tissue with wiping    Herpes:     Throat:  Acyclovir      Oragel  Famvir  Valtrex     Vaccines:         Flu Shot Leg Cramps:       *Gardasil  Benadryl      Hepatitis A         Hepatitis B Nasal Spray:       Pneumovax  Saline Nasal Spray     Polio Booster  Tetanus Nausea:       Tuberculosis test or PPD  Vitamin B6 25 mg TID   AVOID:    Dramamine      *Gardasil  Emetrol       Live  Poliovirus  Ginger Root 250 mg QID    MMR (measles, mumps &  High Complex Carbs @ Bedtime    rebella)  Sea Bands-Accupressure    Varicella (Chickenpox)  Unisom 1/2 tab TID     *No known complications           If received before Pain:         Known pregnancy;   Darvocet       Resume series after  Lortab        Delivery  Percocet    Yeast:   Tramadol      Femstat  Tylenol 3      Gyne-lotrimin  Ultram       Monistat  Vicodin           MISC:         All Sunscreens           Hair Coloring/highlights          Insect Repellant's          (Including DEET)         Mystic Tans Fluconazole tablets What is this medicine? FLUCONAZOLE (floo KON na zole) is an antifungal medicine. It is used to treat certain kinds of fungal or yeast infections. This medicine may be used for other purposes; ask your health care provider or pharmacist if you have questions. COMMON BRAND NAME(S): Diflucan What should I tell my health care provider before I take this medicine? They need to know if you have any of these conditions: -history of irregular heart beat -kidney disease -an unusual or allergic reaction to fluconazole, other azole antifungals, medicines, foods, dyes, or preservatives -pregnant or trying to get pregnant -breast-feeding How should I use this medicine? Take this medicine by mouth. Follow the directions on the prescription label. Do not take your medicine more often than directed. Talk to your pediatrician regarding the use of this medicine in children. Special care may be needed. This medicine has been used in children as young as 58 months of age. Overdosage: If you think you have taken too much of this medicine contact a poison control center or emergency room at once. NOTE: This medicine is only for you. Do not share this medicine with others. What if I miss a dose? If you miss a dose, take it as soon as you can. If it is almost time for your next dose, take only that dose. Do not take  double or extra doses. What may interact with this medicine? Do not take this medicine with any of the following medications: -astemizole -certain medicines for irregular heart beat like dofetilide, dronedarone, quinidine -cisapride -erythromycin -lomitapide -other medicines that prolong the QT interval (cause an abnormal heart rhythm) -pimozide -terfenadine -thioridazine -tolvaptan -ziprasidone This medicine may also interact with the following medications: -antiviral medicines for HIV or AIDS -birth control pills -certain antibiotics like rifabutin, rifampin -certain medicines for blood pressure like amlodipine, isradipine, felodipine, hydrochlorothiazide, losartan, nifedipine -certain medicines for cancer like cyclophosphamide, vinblastine, vincristine -certain medicines for cholesterol like atorvastatin, lovastatin, fluvastatin, simvastatin -certain medicines for depression, anxiety, or psychotic disturbances like amitriptyline, midazolam, nortriptyline, triazolam -certain medicines for diabetes like glipizide, glyburide, tolbutamide -certain medicines for pain like alfentanil, fentanyl, methadone -certain medicines for seizures  like carbamazepine, phenytoin -certain medicines that treat or prevent blood clots like warfarin -halofantrine -medicines that lower your chance of fighting infection like cyclosporine, prednisone, tacrolimus -NSAIDS, medicines for pain and inflammation, like celecoxib, diclofenac, flurbiprofen, ibuprofen, meloxicam, naproxen -other medicines for fungal infections -sirolimus -theophylline -tofacitinib This list may not describe all possible interactions. Give your health care provider a list of all the medicines, herbs, non-prescription drugs, or dietary supplements you use. Also tell them if you smoke, drink alcohol, or use illegal drugs. Some items may interact with your medicine. What should I watch for while using this medicine? Visit your doctor or  health care professional for regular checkups. If you are taking this medicine for a long time you may need blood work. Tell your doctor if your symptoms do not improve. Some fungal infections need many weeks or months of treatment to cure. Alcohol can increase possible damage to your liver. Avoid alcoholic drinks. If you have a vaginal infection, do not have sex until you have finished your treatment. You can wear a sanitary napkin. Do not use tampons. Wear freshly washed cotton, not synthetic, panties. What side effects may I notice from receiving this medicine? Side effects that you should report to your doctor or health care professional as soon as possible: -allergic reactions like skin rash or itching, hives, swelling of the lips, mouth, tongue, or throat -dark urine -feeling dizzy or faint -irregular heartbeat or chest pain -redness, blistering, peeling or loosening of the skin, including inside the mouth -trouble breathing -unusual bruising or bleeding -vomiting -yellowing of the eyes or skin Side effects that usually do not require medical attention (report to your doctor or health care professional if they continue or are bothersome): -changes in how food tastes -diarrhea -headache -stomach upset or nausea This list may not describe all possible side effects. Call your doctor for medical advice about side effects. You may report side effects to FDA at 1-800-FDA-1088. Where should I keep my medicine? Keep out of the reach of children. Store at room temperature below 30 degrees C (86 degrees F). Throw away any medicine after the expiration date. NOTE: This sheet is a summary. It may not cover all possible information. If you have questions about this medicine, talk to your doctor, pharmacist, or health care provider.  2018 Elsevier/Gold Standard (2012-07-28 19:37:38)

## 2017-05-23 NOTE — Progress Notes (Signed)
Pt is here with c/o yeast infection. Pt has tried OTCs and nothing worked. C/o itching and burning. Pt states she is [redacted] weeks pregnant.

## 2017-05-23 NOTE — Progress Notes (Signed)
GYN ENCOUNTER NOTE  Subjective:       Sydney James is a 22 y.o. G66P0100 female here for gynecologic evaluation of the following issues:  1. Yeast infection unresponsive to home treatment.   Reports white vaginal discharge, vaginal itching and vulvar irritation. No relief with home treatment measures. States yeast infections typically happen "when they are trying to conceive" and requires Diflucan.   Denies difficulty breathing or respiratory distress, chest pain, abdominal pain, vaginal bleeding, dysuria, and leg pain or swelling.    Gynecologic History  Patient's last menstrual period was 04/29/2017 (exact date). Approximately 3 weeks 3 days pregnant.   Contraception: none  Last Pap: 07/2015. Results were: normal  Obstetric History  OB History  Gravida Para Term Preterm AB Living  0  SAB TAB Ectopic Multiple Live Births        0      # Outcome Date GA Lbr Len/2nd Weight Sex Delivery Anes PTL Lv  3 Current           2 Preterm 01/25/15 [redacted]w[redacted]d 03:30 / 01:45 6 lb 4.2 oz (2.84 kg) M Vag-Spont EPI    1 Gravida             Past Medical History:  Diagnosis Date  . Migraine   . Preeclampsia   . Scoliosis    Back brace for 2 years  . Scoliosis     Past Surgical History:  Procedure Laterality Date  . DILATION AND EVACUATION N/A 03/15/2016   Procedure: DILATATION AND EVACUATION;  Surgeon: Herold Harms, MD;  Location: [facility] ORS;  Service: Gynecology;  Laterality: N/A;    Current Outpatient Medications on File Prior to Visit  Medication Sig Dispense Refill  . ferrous sulfate 325 (65 FE) MG tablet Take by mouth.    [name] Kitchen PRENATAL 28-0.8 MG TABS Take by mouth.    . Prenatal Vit-Fe Fumarate-FA (PRENATAL MULTIVITAMIN) TABS tablet Take 1 tablet by mouth daily at 12 noon.    . Vitamin D, Ergocalciferol, (DRISDOL) 50000 units CAPS capsule Take 1 capsule (50,000 Units total) by mouth 2 (two) times a week. (Patient not taking: Reported on 05/23/2017) 30 capsule 1   No  current facility-administered medications on file prior to visit.     Allergies  Allergen Reactions  . Aloe Other (See Comments)    Unknown reaction  . Lubricants     Has to have water based lubricant  . Sulfa Antibiotics Itching and Rash  . Tape Itching and Rash    Social History   Socioeconomic History  . Marital status: Married    Spouse name: Not on file  . Number of children: Not on file  . Years of education: Not on file  . Highest education level: Not on file  Occupational History  . Not on file  Social Needs  . Financial resource strain: Not on file  . Food insecurity:    Worry: Not on file    Inability: Not on file  . Transportation needs:    Medical: Not on file    Non-medical: Not on file  Tobacco Use  . Smoking status: Never Smoker  . Smokeless tobacco: Never Used  Substance and Sexual Activity  . Alcohol use: No  . Drug use: No  . Sexual activity: Yes    Birth control/protection: None  Lifestyle  . Physical activity:    Days per week: Not on file    Minutes per session: Not on  file  . Stress: Not on file  Relationships  . Social connections:    Talks on phone: Not on file    Gets together: Not on file    Attends religious service: Not on file    Active member of club or organization: Not on file    Attends meetings of clubs or organizations: Not on file    Relationship status: Not on file  . Intimate partner violence:    Fear of current or ex partner: Not on file    Emotionally abused: Not on file    Physically abused: Not on file    Forced sexual activity: Not on file  Other Topics Concern  . Not on file  Social History Narrative  . Not on file    Family History  Problem Relation Age of Onset  . Hypertension Father   . Hypertension Mother     The following portions of the patient's history were reviewed and updated as appropriate: allergies, current medications, past family history, past medical history, past social history, past  surgical history and problem list.  Review of Systems  Review of Systems - Negative except as noted above.  History obtained from the patient.   Objective:   BP 123/84   Pulse 91   Ht  (1.702 m)   Wt 197 lb 4 oz (89.5 kg)   LMP 04/29/2017 (Exact Date)   BMI 30.89 kg/m    CONSTITUTIONAL: Well-developed, well-nourished female in no acute distress.   ABDOMEN: Soft, non distended; Non tender.  No Organomegaly.  PELVIC:  External Genitalia: Erythema noted  Vagina: Thick, white discharge present  Cervix: Normal  Uterus: Normal size, shape,consistency, mobile  Adnexa: Normal    Assessment:   1. Vaginal discharge  - NuSwab Vaginitis (VG)  2. Vaginal itching  - NuSwab Vaginitis (VG)  3. Vulvar irritation  - NuSwab Vaginitis (VG)    Plan:   Discussed risks associated with Diflucan dosing in early pregnancy, pt verbalized understanding.   Rx: Diflucan, see orders.   Reviewed red flag symptoms and when to call.   RTC as previously scheduled or sooner if needed.    Gunnar Bulla, CNM [facility], [facility]

## 2017-05-26 LAB — NUSWAB VAGINITIS (VG)
CANDIDA ALBICANS, NAA: POSITIVE — AB
Candida glabrata, NAA: NEGATIVE
Trich vag by NAA: NEGATIVE

## 2017-06-05 ENCOUNTER — Encounter: Payer: Self-pay | Admitting: Obstetrics and Gynecology

## 2017-06-06 ENCOUNTER — Encounter: Payer: Self-pay | Admitting: Obstetrics and Gynecology

## 2017-06-06 ENCOUNTER — Ambulatory Visit (INDEPENDENT_AMBULATORY_CARE_PROVIDER_SITE_OTHER): Admitting: Obstetrics and Gynecology

## 2017-06-06 ENCOUNTER — Other Ambulatory Visit (INDEPENDENT_AMBULATORY_CARE_PROVIDER_SITE_OTHER)

## 2017-06-06 VITALS — BP 149/93 | HR 88 | Ht 67.0 in | Wt 199.6 lb

## 2017-06-06 DIAGNOSIS — N926 Irregular menstruation, unspecified: Secondary | ICD-10-CM

## 2017-06-06 LAB — POCT URINE PREGNANCY: PREG TEST UR: POSITIVE — AB

## 2017-06-06 NOTE — Progress Notes (Signed)
  Subjective:     Patient ID: Sydney PrinceMichaela Camp, female   DOB: 1995/11/21, 22 y.o.   MRN: [id number]  HPI Here for pregnancy confirmation Unsure about exact LMP but thinks it was around 04/06/17,  Home pregnancy test + on 05/02/17 and then developed breast tenderness and fatigue. Denies spotting. Had sudden onset sharp ligament pain in left lower abdomen, especially when sneezes and coughs. Back pain due to bulging disc.  [id number] with 2 normal pregnancies followed by missed AB at 11 weeks with D&C, then 5 weeks SAB.  Review of Systems  Constitutional: Positive for fatigue.  Eyes: Negative.   Respiratory: Negative.   Cardiovascular: Negative.   Gastrointestinal: Negative.   Endocrine: Negative.   Musculoskeletal: Positive for back pain.  Skin: Negative.   Neurological: Positive for headaches.  Hematological: Negative.   Psychiatric/Behavioral: Negative.        Objective:   Physical Exam A&Ox4 Well groomed obese female in no distress Blood pressure (!) 149/93, pulse 88, height 5\' 7"  (1.702 m), weight 199 lb 9.6 oz (90.5 kg), last menstrual period 04/06/2017. UPT+   pelvic u/s today reveals:  Findings:  A single gestational sac is visualized within the uterus measuring approximately 5 3/[redacted] weeks gestation.  A yolk sac is not visualized at this time. A possible fetal pole is visualized, but is not large enough to register dates.  FHR: Very small possible fetal pole visualized, but no FHT at this time. **Possible** CRL measurement: 1.9 mm  Right Ovary measures 2.7 x 2.2 x 2.3 cm and contains a probable corpus luteal cyst.  Left Ovary measures 3.7 x 2.2 x 1.2 cm. It is normal appearance. There is evidence of a corpus luteal cyst in the right ovary. Survey of the adnexa demonstrates no adnexal masses. There is free peritoneal fluid in the cul de sac. Assessment:     Missed menses Previous SAB x 2 after 2 normal pregnancies 1. A gestational sac measuring approximately 5 3/[redacted] weeks  gestation is noted. 2. A possible fetal pole is identified within the sac, however, it is to small to register dates or obtain FHT. 3. Yolk sac is not visualized on today's exam. 4. Bilateral ovaries appear WNL. - Right ovary contains probable corpus luteal cyst.    Plan:     Reviewed findings, will repeat quantBhcg in 48h and u/s in 10 days.    Melody Shambley,CNM

## 2017-06-06 NOTE — Patient Instructions (Signed)
First Trimester of Pregnancy The first trimester of pregnancy is from week 1 until the end of week 13 (months 1 through 3). During this time, your baby will begin to develop inside you. At 6-8 weeks, the eyes and face are formed, and the heartbeat can be seen on ultrasound. At the end of 12 weeks, all the baby's organs are formed. Prenatal care is all the medical care you receive before the birth of your baby. Make sure you get good prenatal care and follow all of your doctor's instructions. Follow these instructions at home: Medicines  Take over-the-counter and prescription medicines only as told by your doctor. Some medicines are safe and some medicines are not safe during pregnancy.  Take a prenatal vitamin that contains at least 600 micrograms (mcg) of folic acid.  If you have trouble pooping (constipation), take medicine that will make your stool soft (stool softener) if your doctor approves. Eating and drinking  Eat regular, healthy meals.  Your doctor will tell you the amount of weight gain that is right for you.  Avoid raw meat and uncooked cheese.  If you feel sick to your stomach (nauseous) or throw up (vomit): ? Eat 4 or 5 small meals a day instead of 3 large meals. ? Try eating a few soda crackers. ? Drink liquids between meals instead of during meals.  To prevent constipation: ? Eat foods that are high in fiber, like fresh fruits and vegetables, whole grains, and beans. ? Drink enough fluids to keep your pee (urine) clear or pale yellow. Activity  Exercise only as told by your doctor. Stop exercising if you have cramps or pain in your lower belly (abdomen) or low back.  Do not exercise if it is too hot, too humid, or if you are in a place of great height (high altitude).  Try to avoid standing for long periods of time. Move your legs often if you must stand in one place for a long time.  Avoid heavy lifting.  Wear low-heeled shoes. Sit and stand up straight.  You  can have sex unless your doctor tells you not to. Relieving pain and discomfort  Wear a good support bra if your breasts are sore.  Take warm water baths (sitz baths) to soothe pain or discomfort caused by hemorrhoids. Use hemorrhoid cream if your doctor says it is okay.  Rest with your legs raised if you have leg cramps or low back pain.  If you have puffy, bulging veins (varicose veins) in your legs: ? Wear support hose or compression stockings as told by your doctor. ? Raise (elevate) your feet for 15 minutes, 3-4 times a day. ? Limit salt in your food. Prenatal care  Schedule your prenatal visits by the twelfth week of pregnancy.  Write down your questions. Take them to your prenatal visits.  Keep all your prenatal visits as told by your doctor. This is important. Safety  Wear your seat belt at all times when driving.  Make a list of emergency phone numbers. The list should include numbers for family, friends, the hospital, and police and fire departments. General instructions  Ask your doctor for a referral to a local prenatal class. Begin classes no later than at the start of month 6 of your pregnancy.  Ask for help if you need counseling or if you need help with nutrition. Your doctor can give you advice or tell you where to go for help.  Do not use hot tubs, steam rooms, or   saunas.  Do not douche or use tampons or scented sanitary pads.  Do not cross your legs for long periods of time.  Avoid all herbs and alcohol. Avoid drugs that are not approved by your doctor.  Do not use any tobacco products, including cigarettes, chewing tobacco, and electronic cigarettes. If you need help quitting, ask your doctor. You may get counseling or other support to help you quit.  Avoid cat litter boxes and soil used by cats. These carry germs that can cause birth defects in the baby and can cause a loss of your baby (miscarriage) or stillbirth.  Visit your dentist. At home, brush  your teeth with a soft toothbrush. Be gentle when you floss. Contact a doctor if:  You are dizzy.  You have mild cramps or pressure in your lower belly.  You have a nagging pain in your belly area.  You continue to feel sick to your stomach, you throw up, or you have watery poop (diarrhea).  You have a bad smelling fluid coming from your vagina.  You have pain when you pee (urinate).  You have increased puffiness (swelling) in your face, hands, legs, or ankles. Get help right away if:  You have a fever.  You are leaking fluid from your vagina.  You have spotting or bleeding from your vagina.  You have very bad belly cramping or pain.  You gain or lose weight rapidly.  You throw up blood. It may look like coffee grounds.  You are around people who have German measles, fifth disease, or chickenpox.  You have a very bad headache.  You have shortness of breath.  You have any kind of trauma, such as from a fall or a car accident. Summary  The first trimester of pregnancy is from week 1 until the end of week 13 (months 1 through 3).  To take care of yourself and your unborn baby, you will need to eat healthy meals, take medicines only if your doctor tells you to do so, and do activities that are safe for you and your baby.  Keep all follow-up visits as told by your doctor. This is important as your doctor will have to ensure that your baby is healthy and growing well. This information is not intended to replace advice given to you by your health care provider. Make sure you discuss any questions you have with your health care provider. Document Released: 06/08/2007 Document Revised: 12/29/2015 Document Reviewed: 12/29/2015 Elsevier Interactive Patient Education  2017 Elsevier Inc.  

## 2017-06-07 ENCOUNTER — Other Ambulatory Visit: Payer: Self-pay | Admitting: Obstetrics and Gynecology

## 2017-06-07 DIAGNOSIS — N926 Irregular menstruation, unspecified: Secondary | ICD-10-CM

## 2017-06-07 LAB — BETA HCG QUANT (REF LAB): HCG QUANT: 6583 m[IU]/mL

## 2017-06-08 ENCOUNTER — Other Ambulatory Visit

## 2017-06-08 ENCOUNTER — Encounter: Payer: Self-pay | Admitting: Obstetrics and Gynecology

## 2017-06-08 DIAGNOSIS — N926 Irregular menstruation, unspecified: Secondary | ICD-10-CM

## 2017-06-09 LAB — BETA HCG QUANT (REF LAB): hCG Quant: 11603 m[IU]/mL

## 2017-06-14 ENCOUNTER — Encounter: Admitting: Obstetrics and Gynecology

## 2017-06-20 ENCOUNTER — Ambulatory Visit (INDEPENDENT_AMBULATORY_CARE_PROVIDER_SITE_OTHER)

## 2017-06-20 DIAGNOSIS — N926 Irregular menstruation, unspecified: Secondary | ICD-10-CM

## 2017-07-03 ENCOUNTER — Other Ambulatory Visit (HOSPITAL_COMMUNITY)
Admission: RE | Admit: 2017-07-03 | Discharge: 2017-07-03 | Disposition: A | Discharge status: Home / Self Care | Source: Ambulatory Visit | Attending: Obstetrics and Gynecology | Admitting: Obstetrics and Gynecology

## 2017-07-03 ENCOUNTER — Ambulatory Visit: Admitting: Obstetrics and Gynecology

## 2017-07-03 VITALS — BP 130/84 | HR 84 | Ht 67.0 in | Wt 200.5 lb

## 2017-07-03 DIAGNOSIS — Z3491 Encounter for supervision of normal pregnancy, unspecified, first trimester: Secondary | ICD-10-CM | POA: Diagnosis present

## 2017-07-03 DIAGNOSIS — Z3A09 9 weeks gestation of pregnancy: Secondary | ICD-10-CM

## 2017-07-03 NOTE — Progress Notes (Signed)
Sydney PrinceMichaela James presents for NOB nurse interview visit. Pregnancy confirmation done here at [facility]. G- 3.  P- 1   . Pregnancy education material explained and given. There is  cats in the home. NOB labs ordered. (TSH/HbgA1c due to Increased BMI),. HIV labs and Drug screen were explained optional and she did not decline. Drug screen ordered. PNV encouraged. Genetic screening options discussed. Genetic testing was ordered she wanted to do Maternit 21 . Pt. To follow up with provider in _2_ weeks for NOB physical.  All questions answered.

## 2017-07-03 NOTE — Patient Instructions (Signed)
First Trimester of Pregnancy The first trimester of pregnancy is from week 1 until the end of week 13 (months 1 through 3). During this time, your baby will begin to develop inside you. At 6-8 weeks, the eyes and face are formed, and the heartbeat can be seen on ultrasound. At the end of 12 weeks, all the baby's organs are formed. Prenatal care is all the medical care you receive before the birth of your baby. Make sure you get good prenatal care and follow all of your doctor's instructions. Follow these instructions at home: Medicines  Take over-the-counter and prescription medicines only as told by your doctor. Some medicines are safe and some medicines are not safe during pregnancy.  Take a prenatal vitamin that contains at least 600 micrograms (mcg) of folic acid.  If you have trouble pooping (constipation), take medicine that will make your stool soft (stool softener) if your doctor approves. Eating and drinking  Eat regular, healthy meals.  Your doctor will tell you the amount of weight gain that is right for you.  Avoid raw meat and uncooked cheese.  If you feel sick to your stomach (nauseous) or throw up (vomit): ? Eat 4 or 5 small meals a day instead of 3 large meals. ? Try eating a few soda crackers. ? Drink liquids between meals instead of during meals.  To prevent constipation: ? Eat foods that are high in fiber, like fresh fruits and vegetables, whole grains, and beans. ? Drink enough fluids to keep your pee (urine) clear or pale yellow. Activity  Exercise only as told by your doctor. Stop exercising if you have cramps or pain in your lower belly (abdomen) or low back.  Do not exercise if it is too hot, too humid, or if you are in a place of great height (high altitude).  Try to avoid standing for long periods of time. Move your legs often if you must stand in one place for a long time.  Avoid heavy lifting.  Wear low-heeled shoes. Sit and stand up straight.  You  can have sex unless your doctor tells you not to. Relieving pain and discomfort  Wear a good support bra if your breasts are sore.  Take warm water baths (sitz baths) to soothe pain or discomfort caused by hemorrhoids. Use hemorrhoid cream if your doctor says it is okay.  Rest with your legs raised if you have leg cramps or low back pain.  If you have puffy, bulging veins (varicose veins) in your legs: ? Wear support hose or compression stockings as told by your doctor. ? Raise (elevate) your feet for 15 minutes, 3-4 times a day. ? Limit salt in your food. Prenatal care  Schedule your prenatal visits by the twelfth week of pregnancy.  Write down your questions. Take them to your prenatal visits.  Keep all your prenatal visits as told by your doctor. This is important. Safety  Wear your seat belt at all times when driving.  Make a list of emergency phone numbers. The list should include numbers for family, friends, the hospital, and police and fire departments. General instructions  Ask your doctor for a referral to a local prenatal class. Begin classes no later than at the start of month 6 of your pregnancy.  Ask for help if you need counseling or if you need help with nutrition. Your doctor can give you advice or tell you where to go for help.  Do not use hot tubs, steam rooms, or   saunas.  Do not douche or use tampons or scented sanitary pads.  Do not cross your legs for long periods of time.  Avoid all herbs and alcohol. Avoid drugs that are not approved by your doctor.  Do not use any tobacco products, including cigarettes, chewing tobacco, and electronic cigarettes. If you need help quitting, ask your doctor. You may get counseling or other support to help you quit.  Avoid cat litter boxes and soil used by cats. These carry germs that can cause birth defects in the baby and can cause a loss of your baby (miscarriage) or stillbirth.  Visit your dentist. At home, brush  your teeth with a soft toothbrush. Be gentle when you floss. Contact a doctor if:  You are dizzy.  You have mild cramps or pressure in your lower belly.  You have a nagging pain in your belly area.  You continue to feel sick to your stomach, you throw up, or you have watery poop (diarrhea).  You have a bad smelling fluid coming from your vagina.  You have pain when you pee (urinate).  You have increased puffiness (swelling) in your face, hands, legs, or ankles. Get help right away if:  You have a fever.  You are leaking fluid from your vagina.  You have spotting or bleeding from your vagina.  You have very bad belly cramping or pain.  You gain or lose weight rapidly.  You throw up blood. It may look like coffee grounds.  You are around people who have German measles, fifth disease, or chickenpox.  You have a very bad headache.  You have shortness of breath.  You have any kind of trauma, such as from a fall or a car accident. Summary  The first trimester of pregnancy is from week 1 until the end of week 13 (months 1 through 3).  To take care of yourself and your unborn baby, you will need to eat healthy meals, take medicines only if your doctor tells you to do so, and do activities that are safe for you and your baby.  Keep all follow-up visits as told by your doctor. This is important as your doctor will have to ensure that your baby is healthy and growing well. This information is not intended to replace advice given to you by your health care provider. Make sure you discuss any questions you have with your health care provider. Document Released: 06/08/2007 Document Revised: 12/29/2015 Document Reviewed: 12/29/2015 Elsevier Interactive Patient Education  2017 Elsevier Inc.  

## 2017-07-04 LAB — CBC WITH DIFFERENTIAL/PLATELET
BASOS: 0 %
Basophils Absolute: 0 10*3/uL (ref 0.0–0.2)
EOS (ABSOLUTE): 0.3 10*3/uL (ref 0.0–0.4)
Eos: 3 %
Hematocrit: 41.8 % (ref 34.0–46.6)
Hemoglobin: 14.4 g/dL (ref 11.1–15.9)
IMMATURE GRANULOCYTES: 1 %
Immature Grans (Abs): 0.1 10*3/uL (ref 0.0–0.1)
Lymphocytes Absolute: 2.8 10*3/uL (ref 0.7–3.1)
Lymphs: 28 %
MCH: 29.7 pg (ref 26.6–33.0)
MCHC: 34.4 g/dL (ref 31.5–35.7)
MCV: 86 fL (ref 79–97)
MONOS ABS: 0.8 10*3/uL (ref 0.1–0.9)
Monocytes: 8 %
NEUTROS PCT: 60 %
Neutrophils Absolute: 5.9 10*3/uL (ref 1.4–7.0)
PLATELETS: 330 10*3/uL (ref 150–450)
RBC: 4.85 x10E6/uL (ref 3.77–5.28)
RDW: 14.1 % (ref 12.3–15.4)
WBC: 9.8 10*3/uL (ref 3.4–10.8)

## 2017-07-04 LAB — URINALYSIS, ROUTINE W REFLEX MICROSCOPIC
Bilirubin, UA: NEGATIVE
GLUCOSE, UA: NEGATIVE
KETONES UA: NEGATIVE
Leukocytes, UA: NEGATIVE
NITRITE UA: NEGATIVE
Protein, UA: NEGATIVE
RBC UA: NEGATIVE
Specific Gravity, UA: 1.015 (ref 1.005–1.030)
Urobilinogen, Ur: 0.2 mg/dL (ref 0.2–1.0)
pH, UA: 6.5 (ref 5.0–7.5)

## 2017-07-04 LAB — TOXOPLASMA ANTIBODIES- IGG AND  IGM: Toxoplasma IgG Ratio: <3 IU/mL (ref 0.0–7.1)

## 2017-07-04 LAB — RPR: RPR: NONREACTIVE

## 2017-07-04 LAB — TSH: TSH: 1.26 u[IU]/mL (ref 0.450–4.500)

## 2017-07-04 LAB — RUBELLA SCREEN: RUBELLA: 2.25 {index} (ref >0.99)

## 2017-07-04 LAB — HIV ANTIBODY (ROUTINE TESTING W REFLEX): HIV Screen 4th Generation wRfx: NONREACTIVE

## 2017-07-04 LAB — HEMOGLOBIN A1C
Est. average glucose Bld gHb Est-mCnc: 111 mg/dL
Hgb A1c MFr Bld: 5.5 % (ref 4.8–5.6)

## 2017-07-04 LAB — VARICELLA ZOSTER ANTIBODY, IGG: VARICELLA: 1103 {index} (ref >165)

## 2017-07-04 LAB — GC/CHLAMYDIA PROBE AMP (~~LOC~~) NOT AT ARMC
Chlamydia: NEGATIVE
Neisseria Gonorrhea: NEGATIVE

## 2017-07-04 LAB — HEPATITIS B SURFACE ANTIGEN: Hepatitis B Surface Ag: NEGATIVE

## 2017-07-04 LAB — ABO AND RH: RH TYPE: POSITIVE

## 2017-07-04 LAB — ANTIBODY SCREEN: Antibody Screen: NEGATIVE

## 2017-07-05 LAB — MONITOR DRUG PROFILE 14(MW)
AMPHETAMINE SCREEN URINE: NEGATIVE ng/mL
BARBITURATE SCREEN URINE: NEGATIVE ng/mL
BENZODIAZEPINE SCREEN, URINE: NEGATIVE ng/mL
Buprenorphine, Urine: NEGATIVE ng/mL
CANNABINOIDS UR QL SCN: NEGATIVE ng/mL
Cocaine (Metab) Scrn, Ur: NEGATIVE ng/mL
Creatinine(Crt), U: 106.1 mg/dL (ref 20.0–300.0)
Fentanyl, Urine: NEGATIVE pg/mL
Meperidine Screen, Urine: NEGATIVE ng/mL
Methadone Screen, Urine: NEGATIVE ng/mL
OXYCODONE+OXYMORPHONE UR QL SCN: NEGATIVE ng/mL
Opiate Scrn, Ur: NEGATIVE ng/mL
PH UR, DRUG SCRN: 6.3 (ref 4.5–8.9)
PROPOXYPHENE SCREEN URINE: NEGATIVE ng/mL
Phencyclidine Qn, Ur: NEGATIVE ng/mL
SPECIFIC GRAVITY: 1.013
Tramadol Screen, Urine: NEGATIVE ng/mL

## 2017-07-05 LAB — URINE CULTURE

## 2017-07-09 LAB — MATERNIT 21 PLUS CORE, BLOOD
CHROMOSOME 18: NEGATIVE
Chromosome 13: NEGATIVE
Chromosome 21: NEGATIVE
Y CHROMOSOME: DETECTED

## 2017-07-10 ENCOUNTER — Telehealth: Payer: Self-pay

## 2017-07-10 NOTE — Telephone Encounter (Signed)
Pt informed of neg MaterniT 21. DOES NOT WANT TO KNOW GENDER! Gender put in envelope for pickup.

## 2017-07-19 ENCOUNTER — Other Ambulatory Visit: Payer: Self-pay | Admitting: Obstetrics and Gynecology

## 2017-07-19 ENCOUNTER — Encounter: Payer: Self-pay | Admitting: Obstetrics and Gynecology

## 2017-07-19 ENCOUNTER — Ambulatory Visit (INDEPENDENT_AMBULATORY_CARE_PROVIDER_SITE_OTHER): Admitting: Obstetrics and Gynecology

## 2017-07-19 VITALS — BP 133/92 | HR 84 | Wt 200.2 lb

## 2017-07-19 DIAGNOSIS — Z3491 Encounter for supervision of normal pregnancy, unspecified, first trimester: Secondary | ICD-10-CM | POA: Diagnosis not present

## 2017-07-19 LAB — POCT URINALYSIS DIPSTICK
BILIRUBIN UA: NEGATIVE
Glucose, UA: NEGATIVE
KETONES UA: NEGATIVE
Leukocytes, UA: NEGATIVE
NITRITE UA: NEGATIVE
PH UA: 6 (ref 5.0–8.0)
PROTEIN UA: NEGATIVE
RBC UA: NEGATIVE
Spec Grav, UA: 1.015 (ref 1.010–1.025)
UROBILINOGEN UA: 0.2 U/dL

## 2017-07-19 NOTE — Patient Instructions (Signed)
Second Trimester of Pregnancy The second trimester is from week 13 through week 28, month 4 through 6. This is often the time in pregnancy that you feel your best. Often times, morning sickness has lessened or quit. You may have more energy, and you may get hungry more often. Your unborn baby (fetus) is growing rapidly. At the end of the sixth month, he or she is about 9 inches long and weighs about 1 pounds. You will likely feel the baby move (quickening) between 18 and 20 weeks of pregnancy. Follow these instructions at home:  Avoid all smoking, herbs, and alcohol. Avoid drugs not approved by your doctor.  Do not use any tobacco products, including cigarettes, chewing tobacco, and electronic cigarettes. If you need help quitting, ask your doctor. You may get counseling or other support to help you quit.  Only take medicine as told by your doctor. Some medicines are safe and some are not during pregnancy.  Exercise only as told by your doctor. Stop exercising if you start having cramps.  Eat regular, healthy meals.  Wear a good support bra if your breasts are tender.  Do not use hot tubs, steam rooms, or saunas.  Wear your seat belt when driving.  Avoid raw meat, uncooked cheese, and liter boxes and soil used by cats.  Take your prenatal vitamins.  Take 1500-2000 milligrams of calcium daily starting at the 20th week of pregnancy until you deliver your baby.  Try taking medicine that helps you poop (stool softener) as needed, and if your doctor approves. Eat more fiber by eating fresh fruit, vegetables, and whole grains. Drink enough fluids to keep your pee (urine) clear or pale yellow.  Take warm water baths (sitz baths) to soothe pain or discomfort caused by hemorrhoids. Use hemorrhoid cream if your doctor approves.  If you have puffy, bulging veins (varicose veins), wear support hose. Raise (elevate) your feet for 15 minutes, 3-4 times a day. Limit salt in your diet.  Avoid heavy  lifting, wear low heals, and sit up straight.  Rest with your legs raised if you have leg cramps or low back pain.  Visit your dentist if you have not gone during your pregnancy. Use a soft toothbrush to brush your teeth. Be gentle when you floss.  You can have sex (intercourse) unless your doctor tells you not to.  Go to your doctor visits. Get help if:  You feel dizzy.  You have mild cramps or pressure in your lower belly (abdomen).  You have a nagging pain in your belly area.  You continue to feel sick to your stomach (nauseous), throw up (vomit), or have watery poop (diarrhea).  You have bad smelling fluid coming from your vagina.  You have pain with peeing (urination). Get help right away if:  You have a fever.  You are leaking fluid from your vagina.  You have spotting or bleeding from your vagina.  You have severe belly cramping or pain.  You lose or gain weight rapidly.  You have trouble catching your breath and have chest pain.  You notice sudden or extreme puffiness (swelling) of your face, hands, ankles, feet, or legs.  You have not felt the baby move in over an hour.  You have severe headaches that do not go away with medicine.  You have vision changes. This information is not intended to replace advice given to you by your health care provider. Make sure you discuss any questions you have with your health care   provider. Document Released: 03/16/2009 Document Revised: 05/28/2015 Document Reviewed: 02/21/2012 Elsevier Interactive Patient Education  2017 Elsevier Inc.  

## 2017-07-19 NOTE — Progress Notes (Signed)
NOB PE- pt has been sick with ear ache, did have 2 headaches over the weekend

## 2017-07-19 NOTE — Progress Notes (Signed)
NEW OB HISTORY AND PHYSICAL  SUBJECTIVE:       Sydney James is a 22 y.o. 173P0100 female, Patient's last menstrual period was 04/28/2017., Estimated Date of Delivery: 02/02/18, 2667w5d, presents today for establishment of Prenatal Care. She has no unusual complaints and complains of headache      Gynecologic History Patient's last menstrual period was 04/28/2017. Unknown Contraception: none Last Pap: 2017. Results were: normal  Obstetric History OB History  Gravida Para Term Preterm AB Living  3 1   1    0  SAB TAB Ectopic Multiple Live Births        0      # Outcome Date GA Lbr Len/2nd Weight Sex Delivery Anes PTL Lv  3 Current           2 Preterm 01/25/15 6875w1d 03:30 / 01:45 6 lb 4.2 oz (2.84 kg) M Vag-Spont EPI    1 Gravida             Past Medical History:  Diagnosis Date  . Migraine   . Preeclampsia   . Scoliosis    Back brace for 2 years  . Scoliosis     Past Surgical History:  Procedure Laterality Date  . DILATION AND EVACUATION N/A 03/15/2016   Procedure: DILATATION AND EVACUATION;  Surgeon: Herold HarmsMartin A Defrancesco, MD;  Location: [facility] ORS;  Service: Gynecology;  Laterality: N/A;    Current Outpatient Medications on File Prior to Visit  Medication Sig Dispense Refill  . amoxicillin-clavulanate (AUGMENTIN) 875-125 MG tablet Take 1 tablet by mouth 2 (two) times daily.    . ferrous sulfate 325 (65 FE) MG tablet Take by mouth.    . predniSONE (STERAPRED UNI-PAK 21 TAB) 5 MG (21) TBPK tablet Take 5 mg by mouth daily.    [name] Kitchen. PRENATAL 28-0.8 MG TABS Take by mouth.    . Prenatal Vit-Fe Fumarate-FA (PRENATAL MULTIVITAMIN) TABS tablet Take 1 tablet by mouth daily at 12 noon.    . Vitamin D, Ergocalciferol, (DRISDOL) 50000 units CAPS capsule Take 1 capsule (50,000 Units total) by mouth 2 (two) times a week. (Patient not taking: Reported on 05/23/2017) 30 capsule 1   No current facility-administered medications on file prior to visit.     Allergies  Allergen Reactions  .  Aloe Other (See Comments)    Unknown reaction  . Lubricants     Has to have water based lubricant  . Sulfa Antibiotics Itching and Rash  . Tape Itching and Rash    Social History   Socioeconomic History  . Marital status: Married    Spouse name: Not on file  . Number of children: Not on file  . Years of education: Not on file  . Highest education level: Not on file  Occupational History  . Not on file  Social Needs  . Financial resource strain: Not on file  . Food insecurity:    Worry: Not on file    Inability: Not on file  . Transportation needs:    Medical: Not on file    Non-medical: Not on file  Tobacco Use  . Smoking status: Never Smoker  . Smokeless tobacco: Never Used  Substance and Sexual Activity  . Alcohol use: No  . Drug use: No  . Sexual activity: Yes    Birth control/protection: None  Lifestyle  . Physical activity:    Days per week: Not on file    Minutes per session: Not on file  . Stress: Not on file  Relationships  .  Social connections:    Talks on phone: Not on file    Gets together: Not on file    Attends religious service: Not on file    Active member of club or organization: Not on file    Attends meetings of clubs or organizations: Not on file    Relationship status: Not on file  . Intimate partner violence:    Fear of current or ex partner: Not on file    Emotionally abused: Not on file    Physically abused: Not on file    Forced sexual activity: Not on file  Other Topics Concern  . Not on file  Social History Narrative  . Not on file    Family History  Problem Relation Age of Onset  . Hypertension Father   . Hypertension Mother     The following portions of the patient's history were reviewed and updated as appropriate: allergies, current medications, past OB history, past medical history, past surgical history, past family history, past social history, and problem list.    OBJECTIVE: Initial Physical Exam (New  OB)  GENERAL APPEARANCE: alert, well appearing, in no apparent distress, oriented to person, place and time, overweight HEAD: normocephalic, atraumatic MOUTH: mucous membranes moist, pharynx normal without lesions and dental hygiene good THYROID: no thyromegaly or masses present BREASTS: not examined LUNGS: clear to auscultation, no wheezes, rales or rhonchi, symmetric air entry HEART: regular rate and rhythm, no murmurs ABDOMEN: soft, nontender, nondistended, no abnormal masses, no epigastric pain, fundus not palpable and FHT present EXTREMITIES: no redness or tenderness in the calves or thighs SKIN: normal coloration and turgor, no rashes LYMPH NODES: no adenopathy palpable NEUROLOGIC: alert, oriented, normal speech, no focal findings or movement disorder noted  PELVIC EXAM EXTERNAL GENITALIA: normal appearing vulva with no masses, tenderness or lesions VAGINA: no abnormal discharge or lesions CERVIX: no lesions or cervical motion tenderness UTERUS: gravid ADNEXA: no masses palpable and nontender  ASSESSMENT: Normal pregnancy Prev. 36 week delivery due to pre-eclampsia  PLAN: Will start on baby ASA at 13 weeks. Prenatal care See orders

## 2017-07-21 LAB — CYTOLOGY - PAP

## 2017-08-17 ENCOUNTER — Other Ambulatory Visit: Payer: Self-pay

## 2017-08-17 ENCOUNTER — Ambulatory Visit (INDEPENDENT_AMBULATORY_CARE_PROVIDER_SITE_OTHER): Admitting: Certified Nurse Midwife

## 2017-08-17 VITALS — BP 115/79 | HR 95 | Wt 203.1 lb

## 2017-08-17 DIAGNOSIS — Z8759 Personal history of other complications of pregnancy, childbirth and the puerperium: Secondary | ICD-10-CM | POA: Insufficient documentation

## 2017-08-17 DIAGNOSIS — Z8751 Personal history of pre-term labor: Secondary | ICD-10-CM | POA: Insufficient documentation

## 2017-08-17 DIAGNOSIS — O9989 Other specified diseases and conditions complicating pregnancy, childbirth and the puerperium: Secondary | ICD-10-CM

## 2017-08-17 DIAGNOSIS — R102 Pelvic and perineal pain: Secondary | ICD-10-CM

## 2017-08-17 DIAGNOSIS — O99891 Other specified diseases and conditions complicating pregnancy: Secondary | ICD-10-CM

## 2017-08-17 DIAGNOSIS — O26899 Other specified pregnancy related conditions, unspecified trimester: Secondary | ICD-10-CM

## 2017-08-17 DIAGNOSIS — M549 Dorsalgia, unspecified: Secondary | ICD-10-CM

## 2017-08-17 DIAGNOSIS — Z3492 Encounter for supervision of normal pregnancy, unspecified, second trimester: Secondary | ICD-10-CM

## 2017-08-17 DIAGNOSIS — Z3A15 15 weeks gestation of pregnancy: Secondary | ICD-10-CM

## 2017-08-17 LAB — POCT URINALYSIS DIPSTICK
BILIRUBIN UA: NEGATIVE
Blood, UA: NEGATIVE
Glucose, UA: NEGATIVE
KETONES UA: NEGATIVE
Leukocytes, UA: NEGATIVE
Nitrite, UA: NEGATIVE
PROTEIN UA: NEGATIVE
Spec Grav, UA: <=1.005 — AB (ref 1.010–1.025)
Urobilinogen, UA: 0.2 E.U./dL
pH, UA: 7 (ref 5.0–8.0)

## 2017-08-17 MED ORDER — ASPIRIN EC 81 MG PO TBEC: 81.0000 mg | DELAYED_RELEASE_TABLET | Freq: Every day | ORAL | 2 refills | Status: DC

## 2017-08-17 MED ORDER — ABDOMINAL SUPPORT/L-XL MISC: 1.0000 | Freq: Every day | 0 refills | Status: DC

## 2017-08-17 NOTE — Progress Notes (Signed)
Pt is here for an [name] visit. 

## 2017-08-17 NOTE — Progress Notes (Signed)
[name]-  Reports new onset heart palpitations, worse after eating and with position change. Heart RRR. Discussed home treatment measures. Encouraged symptom log, will send to Cardiology if symptoms persist.  RX ASA, see orders. Education regarding preeclampsia prevention provided. Request belly band, RX given. Reviewed red flag symptoms and when to call. RTC x 4 weeks for anatomy scan & [name] or sooner if needed.    Sydney James, Nurse Practitioner Student [employer]

## 2017-08-17 NOTE — Patient Instructions (Addendum)
Common Medications Safe in Pregnancy  Acne:      Constipation:  Benzoyl Peroxide     Colace  Clindamycin      Dulcolax Suppository  Topica Erythromycin     Fibercon  Salicylic Acid      Metamucil         Miralax AVOID:        Senakot   Accutane    Cough:  Retin-A       Cough Drops  Tetracycline      Phenergan w/ Codeine if Rx  Minocycline      Robitussin (Plain & DM)  Antibiotics:     Crabs/Lice:  Ceclor       RID  Cephalosporins    AVOID:  E-Mycins      Kwell  Keflex  Macrobid/Macrodantin   Diarrhea:  Penicillin      Kao-Pectate  Zithromax      Imodium AD         PUSH FLUIDS AVOID:       Cipro     Fever:  Tetracycline      Tylenol (Regular or Extra  Minocycline       Strength)  Levaquin      Extra Strength-Do not          Exceed 8 tabs/24 hrs Caffeine:        <200mg/day (equiv. To 1 cup of coffee or  approx. 3 12 oz sodas)         Gas: Cold/Hayfever:       Gas-X  Benadryl      Mylicon  Claritin       Phazyme  **Claritin-D        Chlor-Trimeton    Headaches:  Dimetapp      ASA-Free Excedrin  Drixoral-Non-Drowsy     Cold Compress  Mucinex (Guaifenasin)     Tylenol (Regular or Extra  Sudafed/Sudafed-12 Hour     Strength)  **Sudafed PE Pseudoephedrine   Tylenol Cold & Sinus     Vicks Vapor Rub  Zyrtec  **AVOID if Problems With Blood Pressure         Heartburn: Avoid lying down for at least 1 hour after meals  Aciphex      Maalox     Rash:  Milk of Magnesia     Benadryl    Mylanta       1% Hydrocortisone Cream  Pepcid  Pepcid Complete   Sleep Aids:  Prevacid      Ambien   Prilosec       Benadryl  Rolaids       Chamomile Tea  Tums (Limit 4/day)     Unisom  Zantac       Tylenol PM         Warm milk-add vanilla or  Hemorrhoids:       Sugar for taste  Anusol/Anusol H.C.  (RX: Analapram 2.5%)  Sugar Substitutes:  Hydrocortisone OTC     Ok in moderation  Preparation H      Tucks        Vaseline lotion applied to tissue with  wiping    Herpes:     Throat:  Acyclovir      Oragel  Famvir  Valtrex     Vaccines:         Flu Shot Leg Cramps:       *Gardasil  Benadryl      Hepatitis A         Hepatitis B Nasal Spray:         Pneumovax  Saline Nasal Spray     Polio Booster         Tetanus Nausea:       Tuberculosis test or PPD  Vitamin B6 25 mg TID   AVOID:    Dramamine      *Gardasil  Emetrol       Live Poliovirus  Ginger Root 250 mg QID    MMR (measles, mumps &  High Complex Carbs @ Bedtime    rebella)  Sea Bands-Accupressure    Varicella (Chickenpox)  Unisom 1/2 tab TID     *No known complications           If received before Pain:         Known pregnancy;   Darvocet       Resume series after  Lortab        Delivery  Percocet    Yeast:   Tramadol      Femstat  Tylenol 3      Gyne-lotrimin  Ultram       Monistat  Vicodin           MISC:         All Sunscreens           Hair Coloring/highlights          Insect Repellant's          (Including DEET)         Mystic Tans WHAT OB PATIENTS CAN EXPECT   Confirmation of pregnancy and ultrasound ordered if medically indicated-[redacted] weeks gestation  New OB (NOB) intake with nurse and New OB (NOB) labs- [redacted] weeks gestation  New OB (NOB) physical examination with provider- 11/[redacted] weeks gestation  Flu vaccine-[redacted] weeks gestation  Anatomy scan-[redacted] weeks gestation  Glucose tolerance test, blood work to test for anemia, T-dap vaccine-[redacted] weeks gestation  Vaginal swabs/cultures-STD/Group B strep-[redacted] weeks gestation  Appointments every 4 weeks until 28 weeks  Every 2 weeks from 28 weeks until 36 weeks  Weekly visits from 36 weeks until delivery  Back Pain in Pregnancy Back pain during pregnancy is common. Back pain may be caused by several factors that are related to changes during your pregnancy. Follow these instructions at home: Managing pain, stiffness, and swelling  If directed, apply ice for sudden (acute) back pain. ? Put ice in a plastic bag. ? Place  a towel between your skin and the bag. ? Leave the ice on for 20 minutes, 2-3 times per day.  If directed, apply heat to the affected area before you exercise: ? Place a towel between your skin and the heat pack or heating pad. ? Leave the heat on for 20-30 minutes. ? Remove the heat if your skin turns bright red. This is especially important if you are unable to feel pain, heat, or cold. You may have a greater risk of getting burned. Activity  Exercise as told by your health care provider. Exercising is the best way to prevent or manage back pain.  Listen to your body when lifting. If lifting hurts, ask for help or bend your knees. This uses your leg muscles instead of your back muscles.  Squat down when picking up something from the floor. Do not bend over.  Only use bed rest as told by your health care provider. Bed rest should only be used for the most severe episodes of back pain. Standing, Sitting, and Lying Down  Do not stand in one place for long periods of time.  Use good posture when sitting. Make sure your head rests over your shoulders and is not hanging forward. Use a pillow on your lower back if necessary.  Try sleeping on your side, preferably the left side, with a pillow or two between your legs. If you are sore after a night's rest, your bed may be too soft. A firm mattress may provide more support for your back during pregnancy. General instructions  Do not wear high heels.  Eat a healthy diet. Try to gain weight within your health care provider's recommendations.  Use a maternity girdle, elastic sling, or back brace as told by your health care provider.  Take over-the-counter and prescription medicines only as told by your health care provider.  Keep all follow-up visits as told by your health care provider. This is important. This includes any visits with any specialists, such as a physical therapist. Contact a health care provider if:  Your back pain  interferes with your daily activities.  You have increasing pain in other parts of your body. Get help right away if:  You develop numbness, tingling, weakness, or problems with the use of your arms or legs.  You develop severe back pain that is not controlled with medicine.  You have a sudden change in bowel or bladder control.  You develop shortness of breath, dizziness, or you faint.  You develop nausea, vomiting, or sweating.  You have back pain that is a rhythmic, cramping pain similar to labor pains. Labor pain is usually 1-2 minutes apart, lasts for about 1 minute, and involves a bearing down feeling or pressure in your pelvis.  You have back pain and your water breaks or you have vaginal bleeding.  You have back pain or numbness that travels down your leg.  Your back pain developed after you fell.  You develop pain on one side of your back.  You see blood in your urine.  You develop skin blisters in the area of your back pain. This information is not intended to replace advice given to you by your health care provider. Make sure you discuss any questions you have with your health care provider. Document Released: 03/30/2005 Document Revised: 05/28/2015 Document Reviewed: 09/03/2014 Elsevier Interactive Patient Education  2018 [employer]. Round Ligament Pain The round ligament is a cord of muscle and tissue that helps to support the uterus. It can become a source of pain during pregnancy if it becomes stretched or twisted as the baby grows. The pain usually begins in the second trimester of pregnancy, and it can come and go until the baby is delivered. It is not a serious problem, and it does not cause harm to the baby. Round ligament pain is usually a short, sharp, and pinching pain, but it can also be a dull, lingering, and aching pain. The pain is felt in the lower side of the abdomen or in the groin. It usually starts deep in the groin and moves up to the outside of the  hip area. Pain can occur with:  A sudden change in position.  Rolling over in bed.  Coughing or sneezing.  Physical activity.  Follow these instructions at home: Watch your condition for any changes. Take these steps to help with your pain:  When the pain starts, relax. Then try: ? Sitting down. ? Flexing your knees up to your abdomen. ? Lying on your side with one pillow under your abdomen and another pillow between your legs. ? Sitting in a warm bath  for 15-20 minutes or until the pain goes away.  Take over-the-counter and prescription medicines only as told by your health care provider.  Move slowly when you sit and stand.  Avoid long walks if they cause pain.  Stop or lessen your physical activities if they cause pain.  Contact a health care provider if:  Your pain does not go away with treatment.  You feel pain in your back that you did not have before.  Your medicine is not helping. Get help right away if:  You develop a fever or chills.  You develop uterine contractions.  You develop vaginal bleeding.  You develop nausea or vomiting.  You develop diarrhea.  You have pain when you urinate. This information is not intended to replace advice given to you by your health care provider. Make sure you discuss any questions you have with your health care provider. Document Released: 09/29/2007 Document Revised: 05/28/2015 Document Reviewed: 02/26/2014 Elsevier Interactive Patient Education  [employer].

## 2017-08-26 ENCOUNTER — Encounter: Payer: Self-pay | Admitting: Emergency Medicine

## 2017-08-26 ENCOUNTER — Emergency Department
Admission: EM | Admit: 2017-08-26 | Discharge: 2017-08-26 | Disposition: A | Discharge status: Home / Self Care | Attending: Emergency Medicine | Admitting: Emergency Medicine

## 2017-08-26 ENCOUNTER — Other Ambulatory Visit: Payer: Self-pay

## 2017-08-26 ENCOUNTER — Emergency Department

## 2017-08-26 DIAGNOSIS — R102 Pelvic and perineal pain: Secondary | ICD-10-CM | POA: Insufficient documentation

## 2017-08-26 DIAGNOSIS — Z7982 Long term (current) use of aspirin: Secondary | ICD-10-CM | POA: Insufficient documentation

## 2017-08-26 DIAGNOSIS — O26892 Other specified pregnancy related conditions, second trimester: Secondary | ICD-10-CM | POA: Diagnosis present

## 2017-08-26 DIAGNOSIS — Z3A17 17 weeks gestation of pregnancy: Secondary | ICD-10-CM | POA: Insufficient documentation

## 2017-08-26 DIAGNOSIS — O26899 Other specified pregnancy related conditions, unspecified trimester: Secondary | ICD-10-CM

## 2017-08-26 DIAGNOSIS — R109 Unspecified abdominal pain: Secondary | ICD-10-CM

## 2017-08-26 LAB — CBC
HEMATOCRIT: 35.8 % (ref 35.0–47.0)
Hemoglobin: 12.4 g/dL (ref 12.0–16.0)
MCH: 30.2 pg (ref 26.0–34.0)
MCHC: 34.8 g/dL (ref 32.0–36.0)
MCV: 86.9 fL (ref 80.0–100.0)
Platelets: 285 10*3/uL (ref 150–440)
RBC: 4.12 MIL/uL (ref 3.80–5.20)
RDW: 13.2 % (ref 11.5–14.5)
WBC: 12.4 10*3/uL — AB (ref 3.6–11.0)

## 2017-08-26 LAB — COMPREHENSIVE METABOLIC PANEL
ALBUMIN: 3.4 g/dL — AB (ref 3.5–5.0)
ALT: 18 U/L (ref 0–44)
ANION GAP: 6 (ref 5–15)
AST: 18 U/L (ref 15–41)
Alkaline Phosphatase: 59 U/L (ref 38–126)
BUN: 7 mg/dL (ref 6–20)
CHLORIDE: 106 mmol/L (ref 98–111)
CO2: 24 mmol/L (ref 22–32)
Calcium: 8.9 mg/dL (ref 8.9–10.3)
Creatinine, Ser: 0.43 mg/dL — ABNORMAL LOW (ref 0.44–1.00)
GFR calc Af Amer: >60 mL/min (ref >60)
GFR calc non Af Amer: >60 mL/min (ref >60)
Glucose, Bld: 96 mg/dL (ref 70–99)
Potassium: 3.6 mmol/L (ref 3.5–5.1)
SODIUM: 136 mmol/L (ref 135–145)
Total Bilirubin: 0.2 mg/dL — ABNORMAL LOW (ref 0.3–1.2)
Total Protein: 6.7 g/dL (ref 6.5–8.1)

## 2017-08-26 LAB — URINALYSIS, COMPLETE (UACMP) WITH MICROSCOPIC
BACTERIA UA: NONE SEEN
BILIRUBIN URINE: NEGATIVE
Glucose, UA: NEGATIVE mg/dL
Hgb urine dipstick: NEGATIVE
KETONES UR: NEGATIVE mg/dL
LEUKOCYTES UA: NEGATIVE
Nitrite: NEGATIVE
PH: 6 (ref 5.0–8.0)
PROTEIN: NEGATIVE mg/dL
Specific Gravity, Urine: 1.017 (ref 1.005–1.030)

## 2017-08-26 MED ORDER — SODIUM CHLORIDE 0.9 % IV SOLN
1000.0000 mL | Freq: Once | INTRAVENOUS | Status: AC
  Administered 2017-08-26: 1000 mL via INTRAVENOUS

## 2017-08-26 NOTE — ED Provider Notes (Signed)
[facility] Emergency Department Provider Note   ____________________________________________    I have reviewed the triage vital signs and the nursing notes.   HISTORY  Chief Complaint Abdominal Pain     HPI Sydney James is a 22 y.o. female who presents with complaints of pelvic cramping.  Patient reports she is [redacted] weeks pregnant.  She reports throughout the day she has had significant cramping in her pelvis and feels a pressure in her vagina.  She has been resting throughout the day and trying to stay hydrated.  She is a G4, P1.  Follows with [facility].  Denies vaginal bleeding, no rush of fluids or discharge   Past Medical History:  Diagnosis Date  . Migraine   . Preeclampsia   . Scoliosis    Back brace for 2 years  . Scoliosis     Patient Active Problem List   Diagnosis Date Noted  . History of pre-eclampsia 08/17/2017  . History of preterm delivery 08/17/2017  . Vitamin D deficiency 11/02/2016    Past Surgical History:  Procedure Laterality Date  . DILATION AND EVACUATION N/A 03/15/2016   Procedure: DILATATION AND EVACUATION;  Surgeon: Herold HarmsMartin A Defrancesco, MD;  Location: [facility] ORS;  Service: Gynecology;  Laterality: N/A;    Prior to Admission medications   Medication Sig Start Date End Date Taking? Authorizing Provider  aspirin EC 81 MG tablet Take 1 tablet (81 mg total) by mouth daily. Take after 12 weeks for prevention of preeclampssia later in pregnancy 08/17/17  Yes Lawhorn, Vanessa DurhamJenkins Michelle, CNM  butalbital-acetaminophen-caffeine (FIORICET, [facility]) [phone] MG tablet Take 1 tablet by mouth daily as needed for headache.  08/16/17 08/26/17 Yes [provider]  Elastic Bandages & Supports (ABDOMINAL SUPPORT/L-XL) MISC 1 each by Does not apply route daily. Dispense one (1) abdominal support, [id number]. Diagnosis code: M54.5 08/17/17  Yes Lawhorn, Vanessa DurhamJenkins Michelle, CNM  ferrous sulfate 325 (65 FE) MG tablet Take 325 mg by mouth  daily with breakfast.    Yes [provider]  Prenatal Vit-Fe Fumarate-FA (PRENATAL MULTIVITAMIN) TABS tablet Take 1 tablet by mouth daily at 12 noon.   Yes [provider]     Allergies Aloe; Lubricants; Sulfa antibiotics; and Tape  Family History  Problem Relation Age of Onset  . Hypertension Father   . Hypertension Mother     Social History Social History   Tobacco Use  . Smoking status: Never Smoker  . Smokeless tobacco: Never Used  Substance Use Topics  . Alcohol use: No  . Drug use: No    Review of Systems  Constitutional: No fever/chills Eyes: No visual changes.  ENT: No sore throat. Cardiovascular: Denies chest pain. Respiratory: Denies shortness of breath. Gastrointestinal: As above Genitourinary: No dysuria Musculoskeletal: Negative for back pain. Skin: Negative for rash. Neurological: Negative for headaches or weakness   ____________________________________________   PHYSICAL EXAM:  VITAL SIGNS: ED Triage Vitals  Enc Vitals Group     BP 08/26/17 1909 (!) 138/94     Pulse Rate 08/26/17 1909 93     Resp 08/26/17 1909 20     Temp 08/26/17 1909 97.9 F (36.6 C)     Temp Source 08/26/17 1909 Oral     SpO2 08/26/17 1909 99 %     Weight 08/26/17 1910 92.1 kg (203 lb)     Height 08/26/17 1910 1.702 m (5\' 7" )     Head Circumference --      Peak Flow --  Pain Score 08/26/17 1910 0     Pain Loc --      Pain Edu? --      Excl. in GC? --     Constitutional: Alert and oriented. Eyes: Conjunctivae are normal.   Nose: No congestion/rhinnorhea. Mouth/Throat: Mucous membranes are moist.    Cardiovascular: Normal rate, regular rhythm. Grossly normal heart sounds.  Good peripheral circulation. Respiratory: Normal respiratory effort.  No retractions. L Gastrointestinal: Soft and nontender. No distention.   Musculoskeletal: No lower extremity tenderness nor edema.  Warm and well perfused Neurologic:  Normal speech and language. No  gross focal neurologic deficits are appreciated.  Skin:  Skin is warm, dry and intact. No rash noted. Psychiatric: Mood and affect are normal. Speech and behavior are normal.  ____________________________________________   LABS (all labs ordered are listed, but only abnormal results are displayed)  Labs Reviewed  CBC - Abnormal; Notable for the following components:      Result Value   WBC 12.4 (*)    All other components within normal limits  COMPREHENSIVE METABOLIC PANEL - Abnormal; Notable for the following components:   Creatinine, Ser 0.43 (*)    Albumin 3.4 (*)    Total Bilirubin 0.2 (*)    All other components within normal limits  URINALYSIS, COMPLETE (UACMP) WITH MICROSCOPIC - Abnormal; Notable for the following components:   Color, Urine YELLOW (*)    APPearance HAZY (*)    All other components within normal limits   ____________________________________________  EKG  None ____________________________________________  RADIOLOGY  Ultrasound demonstrates 17-week IUP, normal cervix ____________________________________________   PROCEDURES  Procedure(s) performed: No  Procedures   Critical Care performed: No ____________________________________________   INITIAL IMPRESSION / ASSESSMENT AND PLAN / ED COURSE  Pertinent labs & imaging results that were available during my care of the patient were reviewed by me and considered in my medical decision making (see chart for details).  Patient well-appearing in no acute distress.  Significant history of miscarriage.  No vaginal discharge fluid leakage or bleeding.  Ultrasound overall unremarkable.  Discussed with Dr. Logan Bores of GYN who recommends urinalysis but otherwise no acute intervention, supportive care.  Outpatient follow-up with his office    ____________________________________________   FINAL CLINICAL IMPRESSION(S) / ED DIAGNOSES  Final diagnoses:  Abdominal pain in pregnancy, second trimester         Note:  This document was prepared using Dragon voice recognition software and may include unintentional dictation errors.    Jene Every, MD 08/26/17 2219

## 2017-08-26 NOTE — ED Triage Notes (Signed)
[redacted] weeks pregnant, states began lower abdominal cramping about 0830 this am. No bleeding. Has had ultrasound, single intrauterine pregnancy.

## 2017-09-13 ENCOUNTER — Other Ambulatory Visit: Payer: Self-pay | Admitting: Certified Nurse Midwife

## 2017-09-13 ENCOUNTER — Ambulatory Visit (INDEPENDENT_AMBULATORY_CARE_PROVIDER_SITE_OTHER): Admitting: Certified Nurse Midwife

## 2017-09-13 VITALS — BP 113/75 | HR 79 | Wt 205.4 lb

## 2017-09-13 DIAGNOSIS — Z3492 Encounter for supervision of normal pregnancy, unspecified, second trimester: Secondary | ICD-10-CM

## 2017-09-13 DIAGNOSIS — Z3689 Encounter for other specified antenatal screening: Secondary | ICD-10-CM

## 2017-09-13 DIAGNOSIS — O9989 Other specified diseases and conditions complicating pregnancy, childbirth and the puerperium: Secondary | ICD-10-CM

## 2017-09-13 DIAGNOSIS — R002 Palpitations: Secondary | ICD-10-CM

## 2017-09-13 NOTE — Patient Instructions (Signed)
wWaterbirth Class  June 15, 2016  Wednesday 7:00p - 9:00p  [facility] [place], [place]  July 20, 2016  Wednesday 7:00p - 9:00p [facility] [place], [place]    August 24, 2016   Wednesday 7:00p - 9:000p [facility] [place], [place]  September 21, 2016  Wednesday  7:00p - 9:00p [facility] [place], [place]  October 19, 2016 Wednesday 7:00p - 9:00p [facility] [place], [place]  Interested in a waterbirth?  This informational class will help you discover whether waterbirth is the right fit for you.  Education about waterbirth itself, supplies you would need and how to assemble your support team is what you can expect from this class.  Some obstetrical practices require this class in order to pursue a waterbirth.  (Not all obstetrical practices offer waterbirth check with your healthcare provider)  Register only the expectant mom, but you are encouraged to bring your partner to class!  Fees & Payment No fee  Register Online [email or web address] Search Sydney James

## 2017-09-13 NOTE — Progress Notes (Signed)
[name], doing well. Continues to have increased heart beat every so often  That causes discomfort. Referral to cardiology placed. Discussed swelling in pregnancy and S&S of Pre-E. She is interested in water birth. Reviewed classes and volunteer [employer] information given. She was to ultrasound today but was not scheduled. She return asap for anatomy U/s. Follow up 4 wks for [name].   Doreene Burke, CNM

## 2017-09-14 ENCOUNTER — Ambulatory Visit (INDEPENDENT_AMBULATORY_CARE_PROVIDER_SITE_OTHER)

## 2017-09-14 DIAGNOSIS — Z363 Encounter for antenatal screening for malformations: Secondary | ICD-10-CM | POA: Diagnosis not present

## 2017-09-14 DIAGNOSIS — Z3689 Encounter for other specified antenatal screening: Secondary | ICD-10-CM

## 2017-09-15 ENCOUNTER — Encounter

## 2017-09-15 ENCOUNTER — Encounter: Payer: Self-pay | Admitting: Cardiovascular Disease

## 2017-09-15 ENCOUNTER — Ambulatory Visit (INDEPENDENT_AMBULATORY_CARE_PROVIDER_SITE_OTHER): Admitting: Cardiovascular Disease

## 2017-09-15 VITALS — BP 120/82 | HR 96 | Ht 67.0 in | Wt 205.8 lb

## 2017-09-15 DIAGNOSIS — R0602 Shortness of breath: Secondary | ICD-10-CM | POA: Diagnosis not present

## 2017-09-15 DIAGNOSIS — R002 Palpitations: Secondary | ICD-10-CM

## 2017-09-15 NOTE — Patient Instructions (Addendum)
Testing/Procedures: Your physician has requested that you have an echocardiogram. Echocardiography is a painless test that uses sound waves to create images of your heart. It provides your doctor with information about the size and shape of your heart and how well your heart's chambers and valves are working. This procedure takes approximately one hour. There are no restrictions for this procedure.  Your physician has recommended that you wear a holter monitor. Holter monitors are medical devices that record the heart's electrical activity. Doctors most often use these monitors to diagnose arrhythmias. Arrhythmias are problems with the speed or rhythm of the heartbeat. The monitor is a small, portable device. You can wear one while you do your normal daily activities. This is usually used to diagnose what is causing palpitations/syncope (passing out).    Follow-Up: Your physician recommends that you schedule a follow-up appointment as needed.    It was a pleasure seeing you today here in the office. Please do not hesitate to give us a call back if you have any further questions. [phone]  [place] CellarPamela A. RN, BSN

## 2017-09-15 NOTE — Progress Notes (Signed)
Cardiology Office Note   Date:  09/15/2017   ID:  Sydney PrinceMichaela James, DOB 1995-10-21, MRN [id number]  PCP:  Purcell NailsShambley, Melody N, CNM  Cardiologist:   Lorine BearsMuhammad Arden Axon, MD   Chief Complaint  Patient presents with  . other    Ref by Doreene BurkeAnnie Thompson mid-wife for palpitations & is [redacted] weeks pregnant. Meds reviewed by the pt. verbally.  Pt. c/o palpitations daily and shortness of breath with over exertion.       History of Present Illness: Sydney James is a 22 y.o. female who was referred by Doreene BurkeAnnie Thompson for evaluation of palpitations and shortness of breath.  She has no previous cardiac history.  She is [redacted] weeks pregnant.  This is her fifth pregnancy but she had miscarriages before and she has one 782-1/88-year-old boy.  She does not smoke or drink alcohol.  There is no family history of coronary artery disease, arrhythmia or sudden death. Over the last few months, she has experienced daily palpitations described as skipping and hard beats.  This has been associated with worsening exertional dyspnea with no orthopnea, PND or leg edema.  No chest discomfort.  No dizziness, syncope or presyncope.  She does not feel tachycardic. She does not consume any caffeinated products.  TSH in July was normal.  Recent labs were unremarkable except for mild hypoalbuminemia at 3.4.  She was not anemic.  Hemoglobin was 12.4.    Past Medical History:  Diagnosis Date  . Migraine   . Preeclampsia   . Raynaud's disease   . Scoliosis    Back brace for 2 years  . Scoliosis     Past Surgical History:  Procedure Laterality Date  . DILATION AND EVACUATION N/A 03/15/2016   Procedure: DILATATION AND EVACUATION;  Surgeon: Herold HarmsMartin A Defrancesco, MD;  Location: [facility] ORS;  Service: Gynecology;  Laterality: N/A;     Current Outpatient Medications  Medication Sig Dispense Refill  . aspirin EC 81 MG tablet Take 1 tablet (81 mg total) by mouth daily. Take after 12 weeks for prevention of preeclampssia later in  pregnancy 300 tablet 2  . Elastic Bandages & Supports (ABDOMINAL SUPPORT/L-XL) MISC 1 each by Does not apply route daily. Dispense one (1) abdominal support, [id number]. Diagnosis code: M54.5 1 each 0  . Prenatal Vit-Fe Fumarate-FA (PRENATAL MULTIVITAMIN) TABS tablet Take 1 tablet by mouth daily at 12 noon.     No current facility-administered medications for this visit.     Allergies:   Aloe; Lubricants; Sulfa antibiotics; and Tape    Social History:  The patient  reports that she has never smoked. She has never used smokeless tobacco. She reports that she does not drink alcohol or use drugs.   Family History:  The patient's family history includes Hypertension in her father and mother.    ROS:  Please see the history of present illness.   Otherwise, review of systems are positive for none.   All other systems are reviewed and negative.    PHYSICAL EXAM: VS:  BP 120/82 (BP Location: Right Arm, Patient Position: Sitting, Cuff Size: Normal)   Pulse 96   Ht 5\' 7"  (1.702 m)   Wt 205 lb 12 oz (93.3 kg)   LMP 04/28/2017   BMI 32.23 kg/m  , BMI Body mass index is 32.23 kg/m. GEN: Well nourished, well developed, in no acute distress  HEENT: normal  Neck: no JVD, carotid bruits, or masses Cardiac: RRR; no murmurs, rubs, or gallops,no edema  Respiratory:  clear  to auscultation bilaterally, normal work of breathing GI: soft, nontender, nondistended, + BS MS: no deformity or atrophy  Skin: warm and dry, no rash Neuro:  Strength and sensation are intact Psych: euthymic mood, full affect   EKG:  EKG is ordered today. The ekg ordered today demonstrates normal sinus rhythm with no significant ST or T wave changes.   Recent Labs: 07/03/2017: TSH 1.260 08/26/2017: ALT 18; BUN 7; Creatinine, Ser 0.43; Hemoglobin 12.4; Platelets 285; Potassium 3.6; Sodium 136    Lipid Panel No results found for: CHOL, TRIG, HDL, CHOLHDL, VLDL, LDLCALC, LDLDIRECT    Wt Readings from Last 3 Encounters:    09/15/17 205 lb 12 oz (93.3 kg)  09/13/17 205 lb 6 oz (93.2 kg)  08/26/17 203 lb (92.1 kg)      PAD Screen 09/15/2017  Previous PAD dx? No  Previous surgical procedure? No  Pain with walking? Yes  Subsides with rest? No  Feet/toe relief with dangling? No  Painful, non-healing ulcers? No  Extremities discolored? No      ASSESSMENT AND PLAN:  1.  Palpitations and dyspnea during pregnancy: Her symptoms are suggestive of premature beats.  I requested a 48-hour Holter monitor.  This has been associated with worsening exertional dyspnea and thus we have to rule out structural heart abnormalities.  I requested an echocardiogram. If cardiac work-up reveals only premature beats, no further investigation is needed and I reassured her about the benign nature of these findings. She does not consume significant amount of caffeine and thyroid function was normal.    Disposition:   FU with me as needed.  Signed,  Lorine Bears, MD  09/15/2017 11:11 AM    [facility]

## 2017-09-26 ENCOUNTER — Telehealth: Payer: Self-pay | Admitting: Cardiovascular Disease

## 2017-09-26 NOTE — Telephone Encounter (Signed)
Spoke with Sydney James Maduroobert .  No auth required for patient [facility] plan to see outpatient providers.  No precert auth required for echo or long term monitor.  Reference # [name].

## 2017-09-27 ENCOUNTER — Ambulatory Visit (INDEPENDENT_AMBULATORY_CARE_PROVIDER_SITE_OTHER)

## 2017-09-27 ENCOUNTER — Other Ambulatory Visit: Payer: Self-pay

## 2017-09-27 DIAGNOSIS — R002 Palpitations: Secondary | ICD-10-CM | POA: Diagnosis not present

## 2017-09-27 DIAGNOSIS — R0602 Shortness of breath: Secondary | ICD-10-CM

## 2017-09-28 ENCOUNTER — Telehealth: Payer: Self-pay | Admitting: Cardiovascular Disease

## 2017-09-28 NOTE — Telephone Encounter (Signed)
°  1. Is this related to a heart monitor you are wearing?  yes  2. What is your issue??  Patient is having a reaction to patches she thinks .  Sites are itchy and pink

## 2017-09-28 NOTE — Telephone Encounter (Signed)
The patient stated that she had to take the monitor off due to a reaction to the electrodes. She broke out in a rash and has been taking Benadryl for this.  She wore the 48 Holter for 24 hours.  Message routed tot he provider for his knowledge.

## 2017-10-03 ENCOUNTER — Ambulatory Visit
Admission: RE | Admit: 2017-10-03 | Discharge: 2017-10-03 | Disposition: A | Discharge status: Home / Self Care | Source: Ambulatory Visit | Attending: Cardiovascular Disease | Admitting: Cardiovascular Disease

## 2017-10-03 DIAGNOSIS — R002 Palpitations: Secondary | ICD-10-CM | POA: Insufficient documentation

## 2017-10-12 ENCOUNTER — Ambulatory Visit (INDEPENDENT_AMBULATORY_CARE_PROVIDER_SITE_OTHER): Admitting: Certified Nurse Midwife

## 2017-10-12 VITALS — BP 120/82 | HR 97 | Wt 214.9 lb

## 2017-10-12 DIAGNOSIS — Z130 Encounter for screening for diseases of the blood and blood-forming organs and certain disorders involving the immune mechanism: Secondary | ICD-10-CM

## 2017-10-12 DIAGNOSIS — O09292 Supervision of pregnancy with other poor reproductive or obstetric history, second trimester: Secondary | ICD-10-CM

## 2017-10-12 DIAGNOSIS — Z8669 Personal history of other diseases of the nervous system and sense organs: Secondary | ICD-10-CM

## 2017-10-12 DIAGNOSIS — Z23 Encounter for immunization: Secondary | ICD-10-CM

## 2017-10-12 DIAGNOSIS — Z8759 Personal history of other complications of pregnancy, childbirth and the puerperium: Secondary | ICD-10-CM

## 2017-10-12 DIAGNOSIS — Z113 Encounter for screening for infections with a predominantly sexual mode of transmission: Secondary | ICD-10-CM

## 2017-10-12 DIAGNOSIS — Z131 Encounter for screening for diabetes mellitus: Secondary | ICD-10-CM

## 2017-10-12 DIAGNOSIS — Z3492 Encounter for supervision of normal pregnancy, unspecified, second trimester: Secondary | ICD-10-CM

## 2017-10-12 LAB — POCT URINALYSIS DIPSTICK OB
Bilirubin, UA: NEGATIVE
GLUCOSE, UA: NEGATIVE
KETONES UA: NEGATIVE
Leukocytes, UA: NEGATIVE
Nitrite, UA: NEGATIVE
POC,PROTEIN,UA: NEGATIVE
Spec Grav, UA: 1.015 (ref 1.010–1.025)
Urobilinogen, UA: 0.2 E.U./dL
pH, UA: 6.5 (ref 5.0–8.0)

## 2017-10-12 MED ORDER — METOCLOPRAMIDE HCL 10 MG PO TABS: 10.0000 mg | ORAL_TABLET | Freq: Four times a day (QID) | ORAL | 2 refills | Status: DC | PRN

## 2017-10-12 MED ORDER — MAGNESIUM 200 MG PO TABS: 1.0000 | ORAL_TABLET | Freq: Two times a day (BID) | ORAL | 4 refills | Status: DC

## 2017-10-12 NOTE — Progress Notes (Signed)
[name]- H/a x 10 days with n/v. Taking fioricet and tylenol - not helping. Flu shot given today.

## 2017-10-12 NOTE — Patient Instructions (Addendum)
Influenza (Flu) Vaccine (Inactivated or Recombinant): What You Need to Know 1. Why get vaccinated? Influenza ("flu") is a contagious disease that spreads around the [place] every year, usually between October and May. Flu is caused by influenza viruses, and is spread mainly by coughing, sneezing, and close contact. Anyone can get flu. Flu strikes suddenly and can last several days. Symptoms vary by age, but can include:  fever/chills  sore throat  muscle aches  fatigue  cough  headache  runny or stuffy nose  Flu can also lead to pneumonia and blood infections, and cause diarrhea and seizures in children. If you have a medical condition, such as heart or lung disease, flu can make it worse. Flu is more dangerous for some people. Infants and young children, people 65 years of age and older, pregnant women, and people with certain health conditions or a weakened immune system are at greatest risk. Each year thousands of people in the [place] die from flu, and many more are hospitalized. Flu vaccine can:  keep you from getting flu,  make flu less severe if you do get it, and  keep you from spreading flu to your family and other people. 2. Inactivated and recombinant flu vaccines A dose of flu vaccine is recommended every flu season. Children 6 months through 8 years of age may need two doses during the same flu season. Everyone else needs only one dose each flu season. Some inactivated flu vaccines contain a very small amount of a mercury-based preservative called thimerosal. Studies have not shown thimerosal in vaccines to be harmful, but flu vaccines that do not contain thimerosal are available. There is no live flu virus in flu shots. They cannot cause the flu. There are many flu viruses, and they are always changing. Each year a new flu vaccine is made to protect against three or four viruses that are likely to cause disease in the upcoming flu season. But even when the  vaccine doesn't exactly match these viruses, it may still provide some protection. Flu vaccine cannot prevent:  flu that is caused by a virus not covered by the vaccine, or  illnesses that look like flu but are not.  It takes about 2 weeks for protection to develop after vaccination, and protection lasts through the flu season. 3. Some people should not get this vaccine Tell the person who is giving you the vaccine:  If you have any severe, life-threatening allergies. If you ever had a life-threatening allergic reaction after a dose of flu vaccine, or have a severe allergy to any part of this vaccine, you may be advised not to get vaccinated. Most, but not all, types of flu vaccine contain a small amount of egg protein.  If you ever had Guillain-Barr Syndrome (also called GBS). Some people with a history of GBS should not get this vaccine. This should be discussed with your doctor.  If you are not feeling well. It is usually okay to get flu vaccine when you have a mild illness, but you might be asked to come back when you feel better.  4. Risks of a vaccine reaction With any medicine, including vaccines, there is a chance of reactions. These are usually mild and go away on their own, but serious reactions are also possible. Most people who get a flu shot do not have any problems with it. Minor problems following a flu shot include:  soreness, redness, or swelling where the shot was given  hoarseness  sore,   red or itchy eyes  cough  fever  aches  headache  itching  fatigue  If these problems occur, they usually begin soon after the shot and last 1 or 2 days. More serious problems following a flu shot can include the following:  There may be a small increased risk of Guillain-Barre Syndrome (GBS) after inactivated flu vaccine. This risk has been estimated at 1 or 2 additional cases per million people vaccinated. This is much lower than the risk of severe complications from  flu, which can be prevented by flu vaccine.  Young children who get the flu shot along with pneumococcal vaccine (PCV13) and/or DTaP vaccine at the same time might be slightly more likely to have a seizure caused by fever. Ask your doctor for more information. Tell your doctor if a child who is getting flu vaccine has ever had a seizure.  Problems that could happen after any injected vaccine:  People sometimes faint after a medical procedure, including vaccination. Sitting or lying down for about 15 minutes can help prevent fainting, and injuries caused by a fall. Tell your doctor if you feel dizzy, or have vision changes or ringing in the ears.  Some people get severe pain in the shoulder and have difficulty moving the arm where a shot was given. This happens very rarely.  Any medication can cause a severe allergic reaction. Such reactions from a vaccine are very rare, estimated at about 1 in a million doses, and would happen within a few minutes to a few hours after the vaccination. As with any medicine, there is a very remote chance of a vaccine causing a serious injury or death. The safety of vaccines is always being monitored. For more information, visit: http://www.aguilar.org/ 5. What if there is a serious reaction? What should I look for? Look for anything that concerns you, such as signs of a severe allergic reaction, very high fever, or unusual behavior. Signs of a severe allergic reaction can include hives, swelling of the face and throat, difficulty breathing, a fast heartbeat, dizziness, and weakness. These would start a few minutes to a few hours after the vaccination. What should I do?  If you think it is a severe allergic reaction or other emergency that can't wait, call 9-1-1 and get the person to the nearest hospital. Otherwise, call your doctor.  Reactions should be reported to the Vaccine Adverse Event Reporting System (VAERS). Your doctor should file this report, or you  can do it yourself through the VAERS web site at www.vaers.SamedayNews.es, or by calling 6094730752. ? VAERS does not give medical advice. 6. The National Vaccine Injury Compensation Program The [employer] Vaccine Injury Compensation Program (VICP) is a federal program that was created to compensate people who may have been injured by certain vaccines. Persons who believe they may have been injured by a vaccine can learn about the program and about filing a claim by calling 458-267-6070 or visiting the [employer] website at GoldCloset.com.ee. There is a time limit to file a claim for compensation. 7. How can I learn more?  Ask your healthcare provider. He or she can give you the vaccine package insert or suggest other sources of information.  Call your local or state health department.  Contact the Centers for Disease Control and Prevention (CDC): ? Call (540)164-9661 (1-800-CDC-INFO) or ? Visit CDC's website at https://gibson.com/ Vaccine Information Statement, Inactivated Influenza Vaccine (08/09/2013) This information is not intended to replace advice given to you by your health care provider. Make sure  you discuss any questions you have with your health care provider. Document Released: 10/14/2005 Document Revised: 09/10/2015 Document Reviewed: 09/10/2015 Elsevier Interactive Patient Education  2017 Elsevier Inc. WHAT OB PATIENTS CAN EXPECT   Confirmation of pregnancy and ultrasound ordered if medically indicated-[redacted] weeks gestation  New OB (NOB) intake with nurse and New OB (NOB) labs- [redacted] weeks gestation  New OB (NOB) physical examination with provider- 11/[redacted] weeks gestation  Flu vaccine-[redacted] weeks gestation  Anatomy scan-[redacted] weeks gestation  Glucose tolerance test, blood work to test for anemia, T-dap vaccine-[redacted] weeks gestation  Vaginal swabs/cultures-STD/Group B strep-[redacted] weeks gestation  Appointments every 4 weeks until 28 weeks  Every 2 weeks from 28 weeks until 36  weeks  Weekly visits from 36 weeks until delivery

## 2017-10-12 NOTE — Progress Notes (Addendum)
[name]-Reports migraine and nausea unresponsive to Fioricet. Discussed home treatment measures. Rx: Reglan and Magnesium, see orders. Advised follow up with Neuro and starting propanolol for migraine prophylaxis. Flu vaccine given, see orders. Anticipatory guidance regarding antenatal screening and course of prenatal care. Reviewed red flag symptoms and when to call. RTC x 4 weeks for 28 week labs and [name] or sooner if needed.

## 2017-10-13 ENCOUNTER — Telehealth: Payer: Self-pay | Admitting: *Deleted

## 2017-10-13 NOTE — Telephone Encounter (Signed)
-----   Message from Iran Ouch, MD sent at 10/13/2017  4:44 PM EDT -----  Holter monitor showed some PVCs which are likely responsible for her palpitations.  Overall, these are not serious and are not happening frequently enough to require treatment.

## 2017-10-13 NOTE — Telephone Encounter (Signed)
No answer. Left message to call back.   
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
## 2017-10-25 ENCOUNTER — Other Ambulatory Visit: Payer: Self-pay

## 2017-10-25 ENCOUNTER — Emergency Department
Admission: EM | Admit: 2017-10-25 | Discharge: 2017-10-26 | Disposition: A | Discharge status: Home / Self Care | Attending: Emergency Medicine | Admitting: Emergency Medicine

## 2017-10-25 ENCOUNTER — Encounter: Payer: Self-pay | Admitting: *Deleted

## 2017-10-25 DIAGNOSIS — M419 Scoliosis, unspecified: Secondary | ICD-10-CM | POA: Diagnosis not present

## 2017-10-25 DIAGNOSIS — O2343 Unspecified infection of urinary tract in pregnancy, third trimester: Secondary | ICD-10-CM | POA: Insufficient documentation

## 2017-10-25 DIAGNOSIS — Z8759 Personal history of other complications of pregnancy, childbirth and the puerperium: Secondary | ICD-10-CM | POA: Diagnosis not present

## 2017-10-25 DIAGNOSIS — Z3A25 25 weeks gestation of pregnancy: Secondary | ICD-10-CM | POA: Diagnosis not present

## 2017-10-25 DIAGNOSIS — G43109 Migraine with aura, not intractable, without status migrainosus: Secondary | ICD-10-CM | POA: Diagnosis not present

## 2017-10-25 DIAGNOSIS — O26893 Other specified pregnancy related conditions, third trimester: Secondary | ICD-10-CM | POA: Diagnosis not present

## 2017-10-25 DIAGNOSIS — O99355 Diseases of the nervous system complicating the puerperium: Secondary | ICD-10-CM | POA: Insufficient documentation

## 2017-10-25 DIAGNOSIS — Z3492 Encounter for supervision of normal pregnancy, unspecified, second trimester: Secondary | ICD-10-CM

## 2017-10-25 DIAGNOSIS — O2342 Unspecified infection of urinary tract in pregnancy, second trimester: Secondary | ICD-10-CM

## 2017-10-25 DIAGNOSIS — O99113 Other diseases of the blood and blood-forming organs and certain disorders involving the immune mechanism complicating pregnancy, third trimester: Secondary | ICD-10-CM | POA: Insufficient documentation

## 2017-10-25 NOTE — ED Triage Notes (Signed)
Pt reporting head pain and neck pain that started suddenly. Pt reports seeing stars with nausea also. No vomiting. Light sensitivity with intermittent near syncopal episodes. Pt took home medications with a small amount of relief but not complete relief.   Pt also [redacted] weeks pregnant with no complications of the pregnancy.

## 2017-10-26 LAB — CBC WITH DIFFERENTIAL/PLATELET
Abs Immature Granulocytes: 0.2 10*3/uL — ABNORMAL HIGH (ref 0.00–0.07)
BASOS ABS: 0.1 10*3/uL (ref 0.0–0.1)
BASOS PCT: 0 %
EOS ABS: 0.2 10*3/uL (ref 0.0–0.5)
EOS PCT: 2 %
HEMATOCRIT: 34 % — AB (ref 36.0–46.0)
Hemoglobin: 11.5 g/dL — ABNORMAL LOW (ref 12.0–15.0)
IMMATURE GRANULOCYTES: 2 %
LYMPHS ABS: 2.6 10*3/uL (ref 0.7–4.0)
Lymphocytes Relative: 21 %
MCH: 30.7 pg (ref 26.0–34.0)
MCHC: 33.8 g/dL (ref 30.0–36.0)
MCV: 90.9 fL (ref 80.0–100.0)
MONO ABS: 0.9 10*3/uL (ref 0.1–1.0)
Monocytes Relative: 8 %
NEUTROS ABS: 8.5 10*3/uL — AB (ref 1.7–7.7)
Neutrophils Relative %: 67 %
PLATELETS: 281 10*3/uL (ref 150–400)
RBC: 3.74 MIL/uL — ABNORMAL LOW (ref 3.87–5.11)
RDW: 12.6 % (ref 11.5–15.5)
WBC: 12.5 10*3/uL — AB (ref 4.0–10.5)
nRBC: 0 % (ref 0.0–0.2)

## 2017-10-26 LAB — COMPREHENSIVE METABOLIC PANEL
ALBUMIN: 3 g/dL — AB (ref 3.5–5.0)
ALT: 17 U/L (ref 0–44)
ANION GAP: 6 (ref 5–15)
AST: 16 U/L (ref 15–41)
Alkaline Phosphatase: 74 U/L (ref 38–126)
BILIRUBIN TOTAL: 0.5 mg/dL (ref 0.3–1.2)
BUN: 7 mg/dL (ref 6–20)
CHLORIDE: 107 mmol/L (ref 98–111)
CO2: 25 mmol/L (ref 22–32)
Calcium: 8.6 mg/dL — ABNORMAL LOW (ref 8.9–10.3)
Creatinine, Ser: 0.49 mg/dL (ref 0.44–1.00)
GFR calc Af Amer: >60 mL/min (ref >60)
GFR calc non Af Amer: >60 mL/min (ref >60)
GLUCOSE: 106 mg/dL — AB (ref 70–99)
POTASSIUM: 3.4 mmol/L — AB (ref 3.5–5.1)
Sodium: 138 mmol/L (ref 135–145)
Total Protein: 6.5 g/dL (ref 6.5–8.1)

## 2017-10-26 LAB — URINALYSIS, MICROSCOPIC (REFLEX)

## 2017-10-26 LAB — URINALYSIS, ROUTINE W REFLEX MICROSCOPIC
Bilirubin Urine: NEGATIVE
Glucose, UA: NEGATIVE mg/dL
Hgb urine dipstick: NEGATIVE
KETONES UR: NEGATIVE mg/dL
NITRITE: NEGATIVE
PROTEIN: NEGATIVE mg/dL
Specific Gravity, Urine: 1.02 (ref 1.005–1.030)
pH: 7 (ref 5.0–8.0)

## 2017-10-26 LAB — LACTATE DEHYDROGENASE: LDH: 114 U/L (ref 98–192)

## 2017-10-26 LAB — PROTEIN / CREATININE RATIO, URINE
Creatinine, Urine: 136 mg/dL
PROTEIN CREATININE RATIO: 0.13 mg/mg{creat} (ref 0.00–0.15)
TOTAL PROTEIN, URINE: 17 mg/dL

## 2017-10-26 MED ORDER — SODIUM CHLORIDE 0.9 % IV BOLUS
500.0000 mL | Freq: Once | INTRAVENOUS | Status: AC
  Administered 2017-10-26: 500 mL via INTRAVENOUS

## 2017-10-26 MED ORDER — METOCLOPRAMIDE HCL 5 MG/ML IJ SOLN
10.0000 mg | INTRAMUSCULAR | Status: AC
  Administered 2017-10-26: 10 mg via INTRAVENOUS
  Filled 2017-10-26: qty 2

## 2017-10-26 MED ORDER — CEPHALEXIN 500 MG PO CAPS
500.0000 mg | ORAL_CAPSULE | Freq: Once | ORAL | Status: AC
  Administered 2017-10-26: 500 mg via ORAL
  Filled 2017-10-26: qty 1

## 2017-10-26 MED ORDER — MAGNESIUM SULFATE 2 GM/50ML IV SOLN
2.0000 g | Freq: Once | INTRAVENOUS | Status: AC
  Administered 2017-10-26: 2 g via INTRAVENOUS
  Filled 2017-10-26: qty 50

## 2017-10-26 MED ORDER — DIPHENHYDRAMINE HCL 50 MG/ML IJ SOLN
12.5000 mg | INTRAMUSCULAR | Status: AC
  Administered 2017-10-26: 12.5 mg via INTRAVENOUS
  Filled 2017-10-26: qty 1

## 2017-10-26 MED ORDER — CEPHALEXIN 500 MG PO CAPS: 500.0000 mg | ORAL_CAPSULE | Freq: Two times a day (BID) | ORAL | 0 refills | Status: DC

## 2017-10-26 NOTE — ED Provider Notes (Signed)
[facility] Emergency Department Provider Note  ____________________________________________   First MD Initiated Contact with Patient 10/26/17 0015     (approximate)  I have reviewed the triage vital signs and the nursing notes.   HISTORY  Chief Complaint Migraine    HPI Sydney James is a 22 y.o. female G5 P1 Ab 3 at about [redacted] weeks gestation who presents for evaluation of migraine.  She reports that she developed posterior head and neck pain with relatively sudden onset about 10 or 11 hours ago which gradually got worse over the course the day.  She does have some visual disturbances that she describes as her migraine aura but is also having pain directly behind her left eye which is atypical for her and "seeing stars".  She has nausea.  The pain is worse with loud sounds and bright light and since she has been in the dark exam room her symptoms have gotten significantly better, going from severe to moderate.  She has had no vomiting.  She denies fever/chills, chest pain, shortness of breath, abdominal pain, pelvic cramping, and vaginal bleeding, and dysuria.  Nothing in particular made the symptoms worse.  She has been having migraines since she was a child and they have gotten a lot better except for when she has been pregnant, during which time she has them much more regularly.  She says this 1 feels different than prior migraines as described above but has improved since being in the ED.  She has had no focal numbness or weakness in any of her extremities, no difficulty with speech, no difficulty with ambulation.   Past Medical History:  Diagnosis Date  . Migraine   . Preeclampsia   . Raynaud's disease   . Scoliosis    Back brace for 2 years  . Scoliosis     Patient Active Problem List   Diagnosis Date Noted  . History of pre-eclampsia 08/17/2017  . History of preterm delivery 08/17/2017  . Vitamin D deficiency 11/02/2016    Past Surgical  History:  Procedure Laterality Date  . DILATION AND EVACUATION N/A 03/15/2016   Procedure: DILATATION AND EVACUATION;  Surgeon: Herold Harms, MD;  Location: [facility] ORS;  Service: Gynecology;  Laterality: N/A;    Prior to Admission medications   Medication Sig Start Date End Date Taking? Authorizing Provider  aspirin EC 81 MG tablet Take 1 tablet (81 mg total) by mouth daily. Take after 12 weeks for prevention of preeclampssia later in pregnancy 08/17/17   Lawhorn, Vanessa Taos, CNM  butalbital-acetaminophen-caffeine (FIORICET WITH CODEINE) [phone] MG capsule Take 1 capsule by mouth every 4 (four) hours as needed for headache.    [provider]  cephALEXin (KEFLEX) 500 MG capsule Take 1 capsule (500 mg total) by mouth 2 (two) times daily. 10/26/17   Loleta Rose, MD  Elastic Bandages & Supports (ABDOMINAL SUPPORT/L-XL) MISC 1 each by Does not apply route daily. Dispense one (1) abdominal support, [id number]. Diagnosis code: M54.5 08/17/17   Gunnar Bulla, CNM  Magnesium 200 MG TABS Take 1 tablet (200 mg total) by mouth 2 (two) times daily. 10/12/17   Lawhorn, Vanessa Livingston, CNM  metoCLOPramide (REGLAN) 10 MG tablet Take 1 tablet (10 mg total) by mouth 4 (four) times daily as needed for nausea or vomiting. 10/12/17   Gunnar Bulla, CNM  Prenatal Vit-Fe Fumarate-FA (PRENATAL MULTIVITAMIN) TABS tablet Take 1 tablet by mouth daily at 12 noon.    [provider]  Allergies Aloe; Lubricants; Sulfa antibiotics; and Tape  Family History  Problem Relation Age of Onset  . Hypertension Father   . Hypertension Mother     Social History Social History   Tobacco Use  . Smoking status: Never Smoker  . Smokeless tobacco: Never Used  Substance Use Topics  . Alcohol use: No  . Drug use: No    Review of Systems Constitutional: No fever/chills Eyes: Migraine aura and "seeing stars" as described above ENT: No sore throat. Cardiovascular:  Denies chest pain. Respiratory: Denies shortness of breath. Gastrointestinal: No abdominal pain.  Nausea, no vomiting.  No diarrhea.  No constipation. Genitourinary: Negative for dysuria. Musculoskeletal: Negative for neck pain.  Negative for back pain. Integumentary: Negative for rash. Neurological: Generalized headache worse in the posterior and behind the left eye as described above.  No focal numbness nor weakness.   ____________________________________________   PHYSICAL EXAM:  VITAL SIGNS: ED Triage Vitals  Enc Vitals Group     BP 10/25/17 2059 140/87     Pulse Rate 10/25/17 2059 94     Resp 10/25/17 2059 16     Temp 10/25/17 2059 98.2 F (36.8 C)     Temp Source 10/25/17 2059 Oral     SpO2 10/25/17 2059 97 %     Weight 10/25/17 2100 97.5 kg (214 lb 14.4 oz)     Height --      Head Circumference --      Peak Flow --      Pain Score 10/25/17 2059 9     Pain Loc --      Pain Edu? --      Excl. in GC? --     Constitutional: Alert and oriented.  Appears mildly uncomfortable but generally appropriate. Eyes: Conjunctivae are normal. PERRL. EOMI. photophobia. Head: Atraumatic. Nose: No congestion/rhinnorhea. Mouth/Throat: Mucous membranes are moist. Neck: No stridor.  No meningeal signs.   Cardiovascular: Normal rate, regular rhythm. Good peripheral circulation. Grossly normal heart sounds. Respiratory: Normal respiratory effort.  No retractions. Lungs CTAB. Gastrointestinal: Soft and nontender.  Gravid uterus as expected for dates.  No distention.  Musculoskeletal: No lower extremity tenderness nor edema. No gross deformities of extremities. Neurologic:  Normal speech and language. No gross focal neurologic deficits are appreciated.  Skin:  Skin is warm, dry and intact. No rash noted. Psychiatric: Mood and affect are normal. Speech and behavior are normal.  ____________________________________________   LABS (all labs ordered are listed, but only abnormal results  are displayed)  Labs Reviewed  CBC WITH DIFFERENTIAL/PLATELET - Abnormal; Notable for the following components:      Result Value   WBC 12.5 (*)    RBC 3.74 (*)    Hemoglobin 11.5 (*)    HCT 34.0 (*)    Neutro Abs 8.5 (*)    Abs Immature Granulocytes 0.20 (*)    All other components within normal limits  COMPREHENSIVE METABOLIC PANEL - Abnormal; Notable for the following components:   Potassium 3.4 (*)    Glucose, Bld 106 (*)    Calcium 8.6 (*)    Albumin 3.0 (*)    All other components within normal limits  URINALYSIS, ROUTINE W REFLEX MICROSCOPIC - Abnormal; Notable for the following components:   APPearance HAZY (*)    Leukocytes, UA TRACE (*)    All other components within normal limits  URINALYSIS, MICROSCOPIC (REFLEX) - Abnormal; Notable for the following components:   Bacteria, UA MANY (*)    All other components within  normal limits  URINE CULTURE  LACTATE DEHYDROGENASE  PROTEIN / CREATININE RATIO, URINE   ____________________________________________  EKG  None - EKG not ordered by ED physician ____________________________________________  RADIOLOGY   ED MD interpretation: No indication for imaging  Official radiology report(s): No results found.  ____________________________________________   PROCEDURES  Critical Care performed: No   Procedure(s) performed:   Procedures   ____________________________________________   INITIAL IMPRESSION / ASSESSMENT AND PLAN / ED COURSE  As part of my medical decision making, I reviewed the following data within the electronic MEDICAL RECORD NUMBER Nursing notes reviewed and incorporated, Labs reviewed , Old chart reviewed and Notes from prior ED visits    Differential diagnosis includes, but is not limited to, complicated migraine during pregnancy, preeclampsia, cavernous sinus thrombosis, pseudotumor cerebri, stress headache, SAH.  The patient's signs and symptoms are strongly consistent and suggestive of  complicated migraine particularly given her history.  She has no focal neurological deficits and is feeling better after being in a dark and quiet room.  At this point I do not feel she would benefit from any imaging.  Her initial blood pressure was a little bit elevated at 140 systolic so I have ordered "preeclampsia labs" which I will review separately as the results come back, but when I saw her, after she was settled and and her pain improved, her blood pressure was 120/70 which is reassuring.  She has had no complications of pregnancy such as vaginal bleeding and there is no indication to further investigate the pregnancy.  I will treat with normal saline 500 mL bolus, metoclopramide 10 mg IV, Benadryl 12.5 mill grams IV, and magnesium 2 g IV.  I will hold off on the Toradol that I usually give even though I have been told by OB/GYN's that it is safe and relatively low doses during pregnancy but since she is in her second trimester I think it is appropriate to avoid NSAIDs.  She requires additional medication she can receive additional Reglan or even Haldol which is been shown to be quite effective for migraines.  I will reassess after treatment of the patient and her husband understand and agree with the plan.  Clinical Course as of Oct 26 221  Thu Oct 26, 2017  0032 No thrombocytopenia  Platelets: 281 [CF]  0039 WNL  LDH: 114 [CF]  0039 Normal LFTs  Comprehensive metabolic panel(!) [CF]  0113 Urinalysis suggests UTI which needs to be treated empirically during pregnancy.  I have given a first dose of Keflex 500 mg, ordered a urine culture, and written a prescription for Keflex as outpatient.  Urinalysis, Microscopic (reflex)(!) [CF]  0113 Within normal limits  Protein Creatinine Ratio: 0.13 [CF]  0113 Negative protein and urinalysis  Protein: NEGATIVE [CF]  0221 The patient states she feels completely better and wants to go home.  I think that is appropriate.  She knows about her UTI and  knows to follow-up as an outpatient with OB/GYN.  She has no sign of preeclampsia.  I gave my usual and customary return precautions.   [CF]    Clinical Course User Index [CF] Loleta Rose, MD    ____________________________________________  FINAL CLINICAL IMPRESSION(S) / ED DIAGNOSES  Final diagnoses:  Migraine with aura and without status migrainosus, not intractable  Second trimester pregnancy  UTI (urinary tract infection) during pregnancy, second trimester     MEDICATIONS GIVEN DURING THIS VISIT:  Medications  magnesium sulfate IVPB 2 g 50 mL (2 g Intravenous New Bag/Given  10/26/17 0111)  sodium chloride 0.9 % bolus 500 mL (0 mLs Intravenous Stopped 10/26/17 0148)  metoCLOPramide (REGLAN) injection 10 mg (10 mg Intravenous Given 10/26/17 0106)  diphenhydrAMINE (BENADRYL) injection 12.5 mg (12.5 mg Intravenous Given 10/26/17 0106)  cephALEXin (KEFLEX) capsule 500 mg (500 mg Oral Given 10/26/17 0115)     ED Discharge Orders         Ordered    cephALEXin (KEFLEX) 500 MG capsule  2 times daily     10/26/17 0113           Note:  This document was prepared using Dragon voice recognition software and may include unintentional dictation errors.    Loleta Rose, MD 10/26/17 [phone]

## 2017-10-26 NOTE — Discharge Instructions (Signed)
You have been seen in the Emergency Department (ED) for a migraine.  Please use Tylenol or other medications you have at home as needed for symptoms, but only as written on the box, and take any regular medications that have been prescribed for you.  As we have discussed, please follow up with your doctor as soon as possible regarding todays ED visit and your headache symptoms.   You also have a urinary tract infection (UTI) for which we prescribed a week-long course of antibiotics.  Please complete the treatment and follow up with your doctor to talk about whether or not repeat testing is indicated.  Call your doctor or return to the Emergency Department (ED) if you have a worsening headache, sudden and severe headache, confusion, slurred speech, facial droop, weakness or numbness in any arm or leg, extreme fatigue, or other symptoms that concern you.

## 2017-10-27 LAB — URINE CULTURE
Culture: NO GROWTH
Special Requests: NORMAL

## 2017-11-02 ENCOUNTER — Ambulatory Visit (INDEPENDENT_AMBULATORY_CARE_PROVIDER_SITE_OTHER): Admitting: Obstetrics and Gynecology

## 2017-11-02 VITALS — BP 129/89 | HR 77 | Wt 218.2 lb

## 2017-11-02 DIAGNOSIS — B379 Candidiasis, unspecified: Secondary | ICD-10-CM

## 2017-11-02 DIAGNOSIS — Z331 Pregnant state, incidental: Secondary | ICD-10-CM | POA: Diagnosis not present

## 2017-11-02 DIAGNOSIS — Z3492 Encounter for supervision of normal pregnancy, unspecified, second trimester: Secondary | ICD-10-CM

## 2017-11-02 DIAGNOSIS — R102 Pelvic and perineal pain: Secondary | ICD-10-CM

## 2017-11-02 LAB — POCT URINALYSIS DIPSTICK OB
BILIRUBIN UA: NEGATIVE
GLUCOSE, UA: NEGATIVE
KETONES UA: NEGATIVE
Leukocytes, UA: NEGATIVE
Nitrite, UA: NEGATIVE
POC,PROTEIN,UA: NEGATIVE
RBC UA: NEGATIVE
SPEC GRAV UA: 1.01 (ref 1.010–1.025)
Urobilinogen, UA: 0.2 E.U./dL
pH, UA: 7 (ref 5.0–8.0)

## 2017-11-02 MED ORDER — FLUCONAZOLE 150 MG PO TABS: 150.0000 mg | ORAL_TABLET | Freq: Once | ORAL | 0 refills | Status: AC

## 2017-11-02 NOTE — Progress Notes (Signed)
OB WORK IN- pelvic pressure, lots of mucous d/c

## 2017-11-02 NOTE — Progress Notes (Signed)
Work in [employer] increased pressure and lower pelvic pain for a few weeks, reports severe lower round ligament pains, and low back pain. Microscopic wet-mount exam shows hyphae, lactobacilli. Reassured of normal findings except for yeast infection which is most likely due to current antibiotic use.

## 2017-11-06 ENCOUNTER — Ambulatory Visit: Admitting: Cardiovascular Disease

## 2017-11-10 ENCOUNTER — Other Ambulatory Visit

## 2017-11-10 ENCOUNTER — Encounter: Payer: Self-pay | Admitting: Certified Nurse Midwife

## 2017-11-10 ENCOUNTER — Ambulatory Visit (INDEPENDENT_AMBULATORY_CARE_PROVIDER_SITE_OTHER): Admitting: Certified Nurse Midwife

## 2017-11-10 VITALS — BP 115/78 | HR 98 | Wt 221.1 lb

## 2017-11-10 DIAGNOSIS — Z3492 Encounter for supervision of normal pregnancy, unspecified, second trimester: Secondary | ICD-10-CM

## 2017-11-10 DIAGNOSIS — Z8669 Personal history of other diseases of the nervous system and sense organs: Secondary | ICD-10-CM

## 2017-11-10 DIAGNOSIS — Z130 Encounter for screening for diseases of the blood and blood-forming organs and certain disorders involving the immune mechanism: Secondary | ICD-10-CM

## 2017-11-10 DIAGNOSIS — Z23 Encounter for immunization: Secondary | ICD-10-CM

## 2017-11-10 DIAGNOSIS — Z131 Encounter for screening for diabetes mellitus: Secondary | ICD-10-CM

## 2017-11-10 DIAGNOSIS — Z113 Encounter for screening for infections with a predominantly sexual mode of transmission: Secondary | ICD-10-CM

## 2017-11-10 DIAGNOSIS — Z8759 Personal history of other complications of pregnancy, childbirth and the puerperium: Secondary | ICD-10-CM

## 2017-11-10 LAB — POCT URINALYSIS DIPSTICK OB
Bilirubin, UA: NEGATIVE
Blood, UA: NEGATIVE
Glucose, UA: NEGATIVE
Ketones, UA: NEGATIVE
Leukocytes, UA: NEGATIVE
NITRITE UA: NEGATIVE
PH UA: 6.5 (ref 5.0–8.0)
PROTEIN: NEGATIVE
Spec Grav, UA: 1.015 (ref 1.010–1.025)
UROBILINOGEN UA: 0.2 U/dL

## 2017-11-10 MED ORDER — TETANUS-DIPHTH-ACELL PERTUSSIS 5-2.5-18.5 LF-MCG/0.5 IM SUSP
0.5000 mL | Freq: Once | INTRAMUSCULAR | Status: AC
  Administered 2017-11-10: 0.5 mL via INTRAMUSCULAR

## 2017-11-10 NOTE — Progress Notes (Signed)
[name]: pt had consult for cardiology and wore Holter monitor. The "Holter monitor showed some PVCs which are likely responsible for her palpitations.  Overall, these are not serious and are not happening frequently enough to require treatment". Pt is undecided about water birth . She has attended the class. Position statements for [facility]  And ACOG given. PT is to read , next visit to bring in certificate and complete water birth consent form. TDap/BTC/RPR/CBC/glucose screen today. Will follow up in 2 wks.   Doreene Burke, CNM

## 2017-11-10 NOTE — Patient Instructions (Signed)
Glucose Tolerance Test During Pregnancy The glucose tolerance test (GTT) is a blood test used to determine if you have developed a type of diabetes during pregnancy (gestational diabetes). This is when your body does not properly process sugar (glucose) in the food you eat, resulting in high blood glucose levels. Typically, a GTT is done after you have had a 1-hour glucose test with results that indicate you possibly have gestational diabetes. It may also be done if:  You have a history of giving birth to very large babies or have experienced repeated fetal loss (stillbirth).  You have signs and symptoms of diabetes, such as: ? Changes in your vision. ? Tingling or numbness in your hands or feet. ? Changes in hunger, thirst, and urination not otherwise explained by your pregnancy.  The GTT lasts about 3 hours. You will be given a sugar-water solution to drink at the beginning of the test. You will have blood drawn before you drink the solution and then again 1, 2, and 3 hours after you drink it. You will not be allowed to eat or drink anything else during the test. You must remain at the testing location to make sure that your blood is drawn on time. You should also avoid exercising during the test, because exercise can alter test results. How do I prepare for this test? Eat normally for 3 days prior to the GTT test, including having plenty of carbohydrate-rich foods. Do not eat or drink anything except water during the final 12 hours before the test. In addition, your health care provider may ask you to stop taking certain medicines before the test. What do the results mean? It is your responsibility to obtain your test results. Ask the lab or department performing the test when and how you will get your results. Contact your health care provider to discuss any questions you have about your results. Range of Normal Values Ranges for normal values may vary among different labs and hospitals. You  should always check with your health care provider after having lab work or other tests done to discuss whether your values are considered within normal limits. Normal levels of blood glucose are as follows:  Fasting: less than 105 mg/dL.  1 hour after drinking the solution: less than 190 mg/dL.  2 hours after drinking the solution: less than 165 mg/dL.  3 hours after drinking the solution: less than 145 mg/dL.  Some substances can interfere with GTT results. These may include:  Blood pressure and heart failure medicines, including beta blockers, furosemide, and thiazides.  Anti-inflammatory medicines, including aspirin.  Nicotine.  Some psychiatric medicines.  Meaning of Results Outside Normal Value Ranges GTT test results that are above normal values may indicate a number of health problems, such as:  Gestational diabetes.  Acute stress response.  Cushing syndrome.  Tumors such as pheochromocytoma or glucagonoma.  Long-term kidney problems.  Pancreatitis.  Hyperthyroidism.  Current infection.  Discuss your test results with your health care provider. He or she will use the results to make a diagnosis and determine a treatment plan that is right for you. This information is not intended to replace advice given to you by your health care provider. Make sure you discuss any questions you have with your health care provider. Document Released: 06/21/2011 Document Revised: 05/28/2015 Document Reviewed: 04/26/2013 Elsevier Interactive Patient Education  2018 Elsevier Inc. Td Vaccine (Tetanus and Diphtheria): What You Need to Know 1. Why get vaccinated? Tetanus  and diphtheria are very serious   diseases. They are rare in the [place] today, but people who do become infected often have severe complications. Td vaccine is used to protect adolescents and adults from both of these diseases. Both tetanus and diphtheria are infections caused by bacteria. Diphtheria spreads  from person to person through coughing or sneezing. Tetanus-causing bacteria enter the body through cuts, scratches, or wounds. TETANUS (lockjaw) causes painful muscle tightening and stiffness, usually all over the body.  It can lead to tightening of muscles in the head and neck so you can't open your mouth, swallow, or sometimes even breathe. Tetanus kills about 1 out of every 10 people who are infected even after receiving the best medical care.  DIPHTHERIA can cause a thick coating to form in the back of the throat.  It can lead to breathing problems, paralysis, heart failure, and death.  Before vaccines, as many as 200,000 cases of diphtheria and hundreds of cases of tetanus were reported in the [place] each year. Since vaccination began, reports of cases for both diseases have dropped by about 99%. 2. Td vaccine Td vaccine can protect adolescents and adults from tetanus and diphtheria. Td is usually given as a booster dose every 10 years but it can also be given earlier after a severe and dirty wound or burn. Another vaccine, called Tdap, which protects against pertussis in addition to tetanus and diphtheria, is sometimes recommended instead of Td vaccine. Your doctor or the person giving you the vaccine can give you more information. Td may safely be given at the same time as other vaccines. 3. Some people should not get this vaccine  A person who has ever had a life-threatening allergic reaction after a previous dose of any tetanus or diphtheria containing vaccine, OR has a severe allergy to any part of this vaccine, should not get Td vaccine. Tell the person giving the vaccine about any severe allergies.  Talk to your doctor if you: ? had severe pain or swelling after any vaccine containing diphtheria or tetanus, ? ever had a condition called Guillain Barre Syndrome (GBS), ? aren't feeling well on the day the shot is scheduled. 4. What are the risks from Td vaccine? With any  medicine, including vaccines, there is a chance of side effects. These are usually mild and go away on their own. Serious reactions are also possible but are rare. Most people who get Td vaccine do not have any problems with it. Mild problems following Td vaccine: (Did not interfere with activities)  Pain where the shot was given (about 8 people in 10)  Redness or swelling where the shot was given (about 1 person in 4)  Mild fever (rare)  Headache (about 1 person in 4)  Tiredness (about 1 person in 4)  Moderate problems following Td vaccine: (Interfered with activities, but did not require medical attention)  Fever over 102F (rare)  Severe problems following Td vaccine: (Unable to perform usual activities; required medical attention)  Swelling, severe pain, bleeding and/or redness in the arm where the shot was given (rare).  Problems that could happen after any vaccine:  People sometimes faint after a medical procedure, including vaccination. Sitting or lying down for about 15 minutes can help prevent fainting, and injuries caused by a fall. Tell your doctor if you feel dizzy, or have vision changes or ringing in the ears.  Some people get severe pain in the shoulder and have difficulty moving the arm where a shot was given. This happens very rarely.    Any medication can cause a severe allergic reaction. Such reactions from a vaccine are very rare, estimated at fewer than 1 in a million doses, and would happen within a few minutes to a few hours after the vaccination. As with any medicine, there is a very remote chance of a vaccine causing a serious injury or death. The safety of vaccines is always being monitored. For more information, visit: www.cdc.gov/vaccinesafety/ 5. What if there is a serious reaction? What should I look for? Look for anything that concerns you, such as signs of a severe allergic reaction, very high fever, or unusual behavior. Signs of a severe allergic  reaction can include hives, swelling of the face and throat, difficulty breathing, a fast heartbeat, dizziness, and weakness. These would usually start a few minutes to a few hours after the vaccination. What should I do?  If you think it is a severe allergic reaction or other emergency that can't wait, call 9-1-1 or get the person to the nearest hospital. Otherwise, call your doctor.  Afterward, the reaction should be reported to the Vaccine Adverse Event Reporting System (VAERS). Your doctor might file this report, or you can do it yourself through the VAERS web site at www.vaers.hhs.gov, or by calling 1-800-822-7967. ? VAERS does not give medical advice. 6. The National Vaccine Injury Compensation Program The National Vaccine Injury Compensation Program (VICP) is a federal program that was created to compensate people who may have been injured by certain vaccines. Persons who believe they may have been injured by a vaccine can learn about the program and about filing a claim by calling 1-800-338-2382 or visiting the VICP website at www.hrsa.gov/vaccinecompensation. There is a time limit to file a claim for compensation. 7. How can I learn more?  Ask your doctor. He or she can give you the vaccine package insert or suggest other sources of information.  Call your local or state health department.  Contact the Centers for Disease Control and Prevention (CDC): ? Call 1-800-232-4636 (1-800-CDC-INFO) ? Visit CDC's website at www.cdc.gov/vaccines CDC Td Vaccine VIS (04/14/15) This information is not intended to replace advice given to you by your health care provider. Make sure you discuss any questions you have with your health care provider. Document Released: 10/17/2005 Document Revised: 09/10/2015 Document Reviewed: 09/10/2015 Elsevier Interactive Patient Education  2017 Elsevier Inc.  

## 2017-11-16 ENCOUNTER — Other Ambulatory Visit: Payer: Self-pay | Admitting: Certified Nurse Midwife

## 2017-11-16 ENCOUNTER — Telehealth: Payer: Self-pay | Admitting: Certified Nurse Midwife

## 2017-11-16 DIAGNOSIS — Z3493 Encounter for supervision of normal pregnancy, unspecified, third trimester: Secondary | ICD-10-CM

## 2017-11-16 DIAGNOSIS — R7309 Other abnormal glucose: Secondary | ICD-10-CM | POA: Insufficient documentation

## 2017-11-16 LAB — VITAMIN D 1,25 DIHYDROXY
Vitamin D 1, 25 (OH)2 Total: 99 pg/mL — ABNORMAL HIGH
Vitamin D2 1, 25 (OH)2: <10 pg/mL
Vitamin D3 1, 25 (OH)2: 90 pg/mL

## 2017-11-16 LAB — CBC
HEMATOCRIT: 33.6 % — AB (ref 34.0–46.6)
Hemoglobin: 11.6 g/dL (ref 11.1–15.9)
MCH: 30.1 pg (ref 26.6–33.0)
MCHC: 34.5 g/dL (ref 31.5–35.7)
MCV: 87 fL (ref 79–97)
Platelets: 279 10*3/uL (ref 150–450)
RBC: 3.86 x10E6/uL (ref 3.77–5.28)
RDW: 12.2 % — AB (ref 12.3–15.4)
WBC: 13.8 10*3/uL — ABNORMAL HIGH (ref 3.4–10.8)

## 2017-11-16 LAB — RPR: RPR Ser Ql: NONREACTIVE

## 2017-11-16 LAB — MAGNESIUM: Magnesium: 1.4 mg/dL — ABNORMAL LOW (ref 1.6–2.3)

## 2017-11-16 LAB — GLUCOSE, 1 HOUR GESTATIONAL: GESTATIONAL DIABETES SCREEN: 173 mg/dL — AB (ref 65–139)

## 2017-11-16 NOTE — Telephone Encounter (Signed)
The patient called and asked about her lab results.  She was informed that they just came back yesterday and that Marcelino DusterMichelle was off and has not reviewed them yet, and she voiced understanding.  She is asking for a followup call with the results today if possible, please advise, thanks.

## 2017-11-16 NOTE — Telephone Encounter (Signed)
Spoke with patient, patient is aware of results.  JML sent MyChart message.

## 2017-11-19 ENCOUNTER — Observation Stay
Admission: EM | Admit: 2017-11-19 | Discharge: 2017-11-19 | Disposition: A | Discharge status: Home / Self Care | Attending: Certified Nurse Midwife | Admitting: Certified Nurse Midwife

## 2017-11-19 ENCOUNTER — Other Ambulatory Visit: Payer: Self-pay

## 2017-11-19 DIAGNOSIS — O26893 Other specified pregnancy related conditions, third trimester: Secondary | ICD-10-CM | POA: Diagnosis present

## 2017-11-19 DIAGNOSIS — Z91048 Other nonmedicinal substance allergy status: Secondary | ICD-10-CM | POA: Diagnosis not present

## 2017-11-19 DIAGNOSIS — O4703 False labor before 37 completed weeks of gestation, third trimester: Principal | ICD-10-CM | POA: Insufficient documentation

## 2017-11-19 DIAGNOSIS — Z8751 Personal history of pre-term labor: Secondary | ICD-10-CM

## 2017-11-19 DIAGNOSIS — Z888 Allergy status to other drugs, medicaments and biological substances status: Secondary | ICD-10-CM | POA: Insufficient documentation

## 2017-11-19 DIAGNOSIS — Z8759 Personal history of other complications of pregnancy, childbirth and the puerperium: Secondary | ICD-10-CM

## 2017-11-19 DIAGNOSIS — Z7982 Long term (current) use of aspirin: Secondary | ICD-10-CM | POA: Insufficient documentation

## 2017-11-19 DIAGNOSIS — O09213 Supervision of pregnancy with history of pre-term labor, third trimester: Secondary | ICD-10-CM

## 2017-11-19 DIAGNOSIS — Z3A29 29 weeks gestation of pregnancy: Secondary | ICD-10-CM | POA: Insufficient documentation

## 2017-11-19 DIAGNOSIS — Z882 Allergy status to sulfonamides status: Secondary | ICD-10-CM | POA: Insufficient documentation

## 2017-11-19 DIAGNOSIS — Z79899 Other long term (current) drug therapy: Secondary | ICD-10-CM | POA: Insufficient documentation

## 2017-11-19 DIAGNOSIS — O09293 Supervision of pregnancy with other poor reproductive or obstetric history, third trimester: Secondary | ICD-10-CM

## 2017-11-19 HISTORY — DX: Ventricular premature depolarization: I49.3

## 2017-11-19 HISTORY — DX: Other intervertebral disc displacement, lumbar region: M51.26

## 2017-11-19 LAB — URINALYSIS, COMPLETE (UACMP) WITH MICROSCOPIC
Bilirubin Urine: NEGATIVE
Glucose, UA: NEGATIVE mg/dL
Hgb urine dipstick: NEGATIVE
Ketones, ur: NEGATIVE mg/dL
Leukocytes, UA: NEGATIVE
Nitrite: NEGATIVE
PH: 7 (ref 5.0–8.0)
Protein, ur: NEGATIVE mg/dL
SPECIFIC GRAVITY, URINE: 1.011 (ref 1.005–1.030)

## 2017-11-19 LAB — WET PREP, GENITAL
Clue Cells Wet Prep HPF POC: NONE SEEN
SPERM: NONE SEEN
Trich, Wet Prep: NONE SEEN
YEAST WET PREP: NONE SEEN

## 2017-11-19 LAB — FETAL FIBRONECTIN: FETAL FIBRONECTIN: NEGATIVE

## 2017-11-19 MED ORDER — TERBUTALINE SULFATE 1 MG/ML IJ SOLN
0.2500 mg | Freq: Once | INTRAMUSCULAR | Status: AC
  Administered 2017-11-19: 0.25 mg via SUBCUTANEOUS
  Filled 2017-11-19: qty 1

## 2017-11-19 MED ORDER — HYDROXYZINE HCL 25 MG PO TABS: 25.0000 mg | ORAL_TABLET | Freq: Three times a day (TID) | ORAL | 0 refills | Status: DC | PRN

## 2017-11-19 MED ORDER — LACTATED RINGERS IV BOLUS
500.0000 mL | Freq: Once | INTRAVENOUS | Status: AC
  Administered 2017-11-19: 500 mL via INTRAVENOUS

## 2017-11-19 MED ORDER — HYDROXYZINE HCL 25 MG PO TABS
25.0000 mg | ORAL_TABLET | Freq: Three times a day (TID) | ORAL | Status: DC | PRN
  Administered 2017-11-19: 25 mg via ORAL
  Filled 2017-11-19 (×2): qty 1

## 2017-11-19 NOTE — Discharge Summary (Signed)
Obstetric Discharge Summary  Patient ID: Sydney PrinceMichaela Rimmer MRN: [id number] DOB/AGE: 26-Aug-1995 22 y.o.   Date of Admission: 11/19/2017 Sydney RoyalsMichelle Danniela James, CNM [name](A. Cherry, MD)  Date of Discharge: 11/19/2017 Sydney RoyalsMichelle Maebelle James, CNM [name](A. Cherry, MD)  Admitting Diagnosis: Observation at 2315w2d  Secondary Diagnosis: History Pre-eclampsia, History of Preterm Birth     Discharge Diagnosis: No other diagnosis   Antepartum Procedures: Oral hydration, labs (see orders), and medications (see MAR)    Brief Hospital Course   L&D OB Triage Note  Sydney James is a 22 y.o. [id number] female at 1715w2d, EDD Estimated Date of Delivery: 02/02/18 who presented to triage for complaints of uterine contractions since 0900.  She was evaluated by myself with no significant findings for preterm labor. An NST was performed and has been reviewed by CNM. She was treated with terbutaline, vistaril, and oral hydration.   Denies difficulty breathing or respiratory distress, chest pain, dysuria, and leg pain or swelling.   NST INTERPRETATION:  Indications: rule out uterine contractions  Mode: External Baseline Rate (A): 140 bpm Variability: Moderate Accelerations: 15 x 15 Decelerations: None Contraction Frequency (min): 3-6   Impression: reactive  Dilation: Closed Effacement (%): Thick Station: Ballotable Exam by:: [employer] CNM   Recent Results (from the past 2160 hour(s))  Wet prep, genital     Status: Abnormal   Collection Time: 11/19/17  1:05 PM  Result Value Ref Range   Yeast Wet Prep HPF POC NONE SEEN NONE SEEN   Trich, Wet Prep NONE SEEN NONE SEEN   Clue Cells Wet Prep HPF POC NONE SEEN NONE SEEN   WBC, Wet Prep HPF POC FEW (A) NONE SEEN   Sperm NONE SEEN     Comment: Performed at [facility], [address] [place], [place] [address]  Fetal fibronectin     Status: None   Collection Time: 11/19/17  1:05 PM  Result Value Ref Range   Fetal Fibronectin NEGATIVE NEGATIVE   Appearance,  FETFIB CLEAR CLEAR    Comment: Performed at [facility], [address] [place], [place] [address]  Urinalysis, Complete w Microscopic     Status: Abnormal   Collection Time: 11/19/17  1:05 PM  Result Value Ref Range   Color, Urine YELLOW (A) YELLOW   APPearance HAZY (A) CLEAR   Specific Gravity, Urine 1.011 1.005 - 1.030   pH 7.0 5.0 - 8.0   Glucose, UA NEGATIVE NEGATIVE mg/dL   Hgb urine dipstick NEGATIVE NEGATIVE   Bilirubin Urine NEGATIVE NEGATIVE   Ketones, ur NEGATIVE NEGATIVE mg/dL   Protein, ur NEGATIVE NEGATIVE mg/dL   Nitrite NEGATIVE NEGATIVE   Leukocytes, UA NEGATIVE NEGATIVE   RBC / HPF 0-5 0 - 5 RBC/hpf   WBC, UA 0-5 0 - 5 WBC/hpf   Bacteria, UA RARE (A) NONE SEEN   Squamous Epithelial / LPF 0-5 0 - 5   Mucus PRESENT    Non Squamous Epithelial PRESENT (A) NONE SEEN    Comment: Performed at [facility], [address] [place], [place] [address]    Plan: NST performed was reviewed and was found to be reactive. She was discharged home with bleeding/labor precautions.  Continue routine prenatal care. Follow up with CNM as previously scheduled.   Discharge Instructions: Per After Visit Summary.  Activity: Also refer to After Visit Summary.  Diet: Regular  Medications: Allergies as of 11/19/2017      Reactions   Aloe Other (See Comments)   Unknown reaction   Lubricants  Has to have water based lubricant   Sulfa Antibiotics Itching, Rash   Tape Itching, Rash      Medication List    TAKE these medications   ABDOMINAL SUPPORT/L-XL Misc 1 each by Does not apply route daily. Dispense one (1) abdominal support, N8295. Diagnosis code: M54.5   aspirin EC 81 MG tablet Take 1 tablet (81 mg total) by mouth daily. Take after 12 weeks for prevention of preeclampssia later in pregnancy   butalbital-acetaminophen-caffeine 50-325-40-30 MG capsule Commonly known as:  FIORICET WITH CODEINE Take 1 capsule by mouth every 4 (four) hours as needed  for headache.   hydrOXYzine 25 MG tablet Commonly known as:  ATARAX/VISTARIL Take 1 tablet (25 mg total) by mouth 3 (three) times daily as needed (contractions).   Magnesium 200 MG Tabs Take 1 tablet (200 mg total) by mouth 2 (two) times daily.   metoCLOPramide 10 MG tablet Commonly known as:  REGLAN Take 1 tablet (10 mg total) by mouth 4 (four) times daily as needed for nausea or vomiting.   prenatal multivitamin Tabs tablet Take 1 tablet by mouth daily at 12 noon.      Outpatient follow up:  Follow-up Information    [facility]. Go on 11/23/2017.   Why:  call as needed Contact information: [address]  [address] [place] [address] [phone]          Discharged Condition: stable  Discharged to: home   Sydney James, [place] [facility], [facility] 11/19/17 7:43 PM

## 2017-11-19 NOTE — Discharge Instructions (Signed)
Braxton Hicks Contractions °Contractions of the uterus can occur throughout pregnancy, but they are not always a sign that you are in labor. You may have practice contractions called Braxton Hicks contractions. These false labor contractions are sometimes confused with true labor. °What are Braxton Hicks contractions? °Braxton Hicks contractions are tightening movements that occur in the muscles of the uterus before labor. Unlike true labor contractions, these contractions do not result in opening (dilation) and thinning of the cervix. Toward the end of pregnancy (32-34 weeks), Braxton Hicks contractions can happen more often and may become stronger. These contractions are sometimes difficult to tell apart from true labor because they can be very uncomfortable. You should not feel embarrassed if you go to the hospital with false labor. °Sometimes, the only way to tell if you are in true labor is for your health care provider to look for changes in the cervix. The health care provider will do a physical exam and may monitor your contractions. If you are not in true labor, the exam should show that your cervix is not dilating and your water has not broken. °If there are other health problems associated with your pregnancy, it is completely safe for you to be sent home with false labor. You may continue to have Braxton Hicks contractions until you go into true labor. °How to tell the difference between true labor and false labor °True labor °· Contractions last 30-70 seconds. °· Contractions become very regular. °· Discomfort is usually felt in the top of the uterus, and it spreads to the lower abdomen and low back. °· Contractions do not go away with walking. °· Contractions usually become more intense and increase in frequency. °· The cervix dilates and gets thinner. °False labor °· Contractions are usually shorter and not as strong as true labor contractions. °· Contractions are usually irregular. °· Contractions  are often felt in the front of the lower abdomen and in the groin. °· Contractions may go away when you walk around or change positions while lying down. °· Contractions get weaker and are shorter-lasting as time goes on. °· The cervix usually does not dilate or become thin. °Follow these instructions at home: °· Take over-the-counter and prescription medicines only as told by your health care provider. °· Keep up with your usual exercises and follow other instructions from your health care provider. °· Eat and drink lightly if you think you are going into labor. °· If Braxton Hicks contractions are making you uncomfortable: °? Change your position from lying down or resting to walking, or change from walking to resting. °? Sit and rest in a tub of warm water. °? Drink enough fluid to keep your urine pale yellow. Dehydration may cause these contractions. °? Do slow and deep breathing several times an hour. °· Keep all follow-up prenatal visits as told by your health care provider. This is important. °Contact a health care provider if: °· You have a fever. °· You have continuous pain in your abdomen. °Get help right away if: °· Your contractions become stronger, more regular, and closer together. °· You have fluid leaking or gushing from your vagina. °· You pass blood-tinged mucus (bloody show). °· You have bleeding from your vagina. °· You have low back pain that you never had before. °· You feel your baby’s head pushing down and causing pelvic pressure. °· Your baby is not moving inside you as much as it used to. °Summary °· Contractions that occur before labor are called Braxton   Hicks contractions, false labor, or practice contractions. °· Braxton Hicks contractions are usually shorter, weaker, farther apart, and less regular than true labor contractions. True labor contractions usually become progressively stronger and regular and they become more frequent. °· Manage discomfort from Braxton Hicks contractions by  changing position, resting in a warm bath, drinking plenty of water, or practicing deep breathing. °This information is not intended to replace advice given to you by your health care provider. Make sure you discuss any questions you have with your health care provider. °Document Released: 05/05/2016 Document Revised: 05/05/2016 Document Reviewed: 05/05/2016 °Elsevier Interactive Patient Education © 2018 Elsevier Inc. ° °

## 2017-11-19 NOTE — OB Triage Note (Signed)
Pt presents with c/o of ctx starting at 0900 AM q 4-15 minutes.  No VB or LOF.  Denies pain with urination or increasing frequency.  Reports pressure in pubic area.  + FM.  Afebrile.

## 2017-11-23 ENCOUNTER — Other Ambulatory Visit

## 2017-11-23 ENCOUNTER — Ambulatory Visit (INDEPENDENT_AMBULATORY_CARE_PROVIDER_SITE_OTHER): Admitting: Obstetrics and Gynecology

## 2017-11-23 VITALS — BP 148/96 | HR 90 | Wt 225.5 lb

## 2017-11-23 DIAGNOSIS — Z3493 Encounter for supervision of normal pregnancy, unspecified, third trimester: Secondary | ICD-10-CM

## 2017-11-23 DIAGNOSIS — R7309 Other abnormal glucose: Secondary | ICD-10-CM

## 2017-11-23 LAB — POCT URINALYSIS DIPSTICK OB
Bilirubin, UA: NEGATIVE
Glucose, UA: NEGATIVE
KETONES UA: NEGATIVE
Leukocytes, UA: NEGATIVE
Nitrite, UA: NEGATIVE
PH UA: 6 (ref 5.0–8.0)
POC,PROTEIN,UA: NEGATIVE
RBC UA: NEGATIVE
SPEC GRAV UA: 1.015 (ref 1.010–1.025)
UROBILINOGEN UA: 0.2 U/dL

## 2017-11-23 NOTE — Progress Notes (Signed)
3hGTT and [name]- rechecked magnesium levels, still having irregular contraction since hospital visit. BP rechecked after sitting in office for 30 minutes 144/89.left lying 122/78. To decrease activity at home with regular rest. To check BP at home daily and alert us if >90 diastolic for 3 readings.

## 2017-11-23 NOTE — Progress Notes (Signed)
[name]- 3 hr GTT done today, pt is having some pressure, having some headaches

## 2017-11-24 ENCOUNTER — Encounter: Payer: Self-pay | Admitting: *Deleted

## 2017-11-24 ENCOUNTER — Inpatient Hospital Stay
Admission: EM | Admit: 2017-11-24 | Discharge: 2017-11-24 | Disposition: A | Discharge status: Home / Self Care | Attending: Obstetrics and Gynecology | Admitting: Obstetrics and Gynecology

## 2017-11-24 ENCOUNTER — Other Ambulatory Visit: Payer: Self-pay

## 2017-11-24 ENCOUNTER — Other Ambulatory Visit: Payer: Self-pay | Admitting: Obstetrics and Gynecology

## 2017-11-24 DIAGNOSIS — Z3A32 32 weeks gestation of pregnancy: Secondary | ICD-10-CM | POA: Diagnosis not present

## 2017-11-24 DIAGNOSIS — Z8751 Personal history of pre-term labor: Secondary | ICD-10-CM

## 2017-11-24 DIAGNOSIS — O133 Gestational [pregnancy-induced] hypertension without significant proteinuria, third trimester: Secondary | ICD-10-CM | POA: Insufficient documentation

## 2017-11-24 DIAGNOSIS — Z8759 Personal history of other complications of pregnancy, childbirth and the puerperium: Secondary | ICD-10-CM

## 2017-11-24 LAB — COMPREHENSIVE METABOLIC PANEL
ALBUMIN: 3.2 g/dL — AB (ref 3.5–5.0)
ALT: 12 IU/L (ref 0–32)
ALT: 16 U/L (ref 0–44)
ANION GAP: 11 (ref 5–15)
AST: 16 IU/L (ref 0–40)
AST: 20 U/L (ref 15–41)
Albumin/Globulin Ratio: 1.5 (ref 1.2–2.2)
Albumin: 3.4 g/dL — ABNORMAL LOW (ref 3.5–5.5)
Alkaline Phosphatase: 93 IU/L (ref 39–117)
Alkaline Phosphatase: 84 U/L (ref 38–126)
BILIRUBIN TOTAL: 0.2 mg/dL (ref 0.0–1.2)
BUN/Creatinine Ratio: 7 — ABNORMAL LOW (ref 9–23)
BUN: 5 mg/dL — ABNORMAL LOW (ref 6–20)
BUN: 7 mg/dL (ref 6–20)
CHLORIDE: 100 mmol/L (ref 96–106)
CHLORIDE: 107 mmol/L (ref 98–111)
CO2: 18 mmol/L — ABNORMAL LOW (ref 20–29)
CO2: 20 mmol/L — ABNORMAL LOW (ref 22–32)
Calcium: 8.8 mg/dL (ref 8.7–10.2)
Calcium: 9.1 mg/dL (ref 8.9–10.3)
Creatinine, Ser: 0.74 mg/dL (ref 0.57–1.00)
Creatinine, Ser: 0.47 mg/dL (ref 0.44–1.00)
GFR calc Af Amer: 131 mL/min/{1.73_m2} (ref >59)
GFR calc Af Amer: >60 mL/min (ref >60)
GFR calc non Af Amer: 114 mL/min/{1.73_m2} (ref >59)
GLOBULIN, TOTAL: 2.3 g/dL (ref 1.5–4.5)
Glucose, Bld: 106 mg/dL — ABNORMAL HIGH (ref 70–99)
Glucose: 174 mg/dL — ABNORMAL HIGH (ref 65–99)
POTASSIUM: 3.2 mmol/L — AB (ref 3.5–5.2)
POTASSIUM: 3.1 mmol/L — AB (ref 3.5–5.1)
SODIUM: 135 mmol/L (ref 134–144)
Sodium: 138 mmol/L (ref 135–145)
TOTAL PROTEIN: 6.9 g/dL (ref 6.5–8.1)
Total Bilirubin: 0.4 mg/dL (ref 0.3–1.2)
Total Protein: 5.7 g/dL — ABNORMAL LOW (ref 6.0–8.5)

## 2017-11-24 LAB — GESTATIONAL GLUCOSE TOLERANCE
GLUCOSE 2 HOUR GTT: 133 mg/dL (ref 65–154)
GLUCOSE 3 HOUR GTT: 61 mg/dL — AB (ref 65–139)
Glucose, Fasting: 92 mg/dL (ref 65–94)
Glucose, GTT - 1 Hour: 179 mg/dL (ref 65–179)

## 2017-11-24 LAB — PROTEIN / CREATININE RATIO, URINE
Creatinine, Urine: 102 mg/dL
PROTEIN CREATININE RATIO: 0.16 mg/mg{creat} — AB (ref 0.00–0.15)
TOTAL PROTEIN, URINE: 16 mg/dL

## 2017-11-24 LAB — CBC WITH DIFFERENTIAL/PLATELET
ABS IMMATURE GRANULOCYTES: 0.07 10*3/uL (ref 0.00–0.07)
BASOS PCT: 0 %
Basophils Absolute: 0 10*3/uL (ref 0.0–0.1)
EOS ABS: 0.2 10*3/uL (ref 0.0–0.5)
EOS PCT: 2 %
HCT: 36.2 % (ref 36.0–46.0)
Hemoglobin: 12 g/dL (ref 12.0–15.0)
Immature Granulocytes: 1 %
Lymphocytes Relative: 20 %
Lymphs Abs: 2.1 10*3/uL (ref 0.7–4.0)
MCH: 30.1 pg (ref 26.0–34.0)
MCHC: 33.1 g/dL (ref 30.0–36.0)
MCV: 90.7 fL (ref 80.0–100.0)
MONO ABS: 0.8 10*3/uL (ref 0.1–1.0)
MONOS PCT: 7 %
NEUTROS ABS: 7.4 10*3/uL (ref 1.7–7.7)
Neutrophils Relative %: 70 %
Platelets: 285 10*3/uL (ref 150–400)
RBC: 3.99 MIL/uL (ref 3.87–5.11)
RDW: 12.2 % (ref 11.5–15.5)
WBC: 10.6 10*3/uL — AB (ref 4.0–10.5)
nRBC: 0 % (ref 0.0–0.2)

## 2017-11-24 LAB — URIC ACID: URIC ACID, SERUM: 4.9 mg/dL (ref 2.5–7.1)

## 2017-11-24 LAB — MAGNESIUM: MAGNESIUM: 1.6 mg/dL (ref 1.6–2.3)

## 2017-11-24 MED ORDER — BETAMETHASONE SOD PHOS & ACET 6 (3-3) MG/ML IJ SUSP
INTRAMUSCULAR | Status: AC
  Administered 2017-11-24: 12 mg via INTRAMUSCULAR
  Filled 2017-11-24: qty 5

## 2017-11-24 MED ORDER — LABETALOL HCL 100 MG PO TABS: 100.0000 mg | ORAL_TABLET | Freq: Two times a day (BID) | ORAL | 1 refills | Status: DC

## 2017-11-24 MED ORDER — POTASSIUM CHLORIDE CRYS ER 10 MEQ PO TBCR
20.0000 meq | EXTENDED_RELEASE_TABLET | Freq: Once | ORAL | Status: AC
  Administered 2017-11-24: 20 meq via ORAL
  Filled 2017-11-24: qty 2

## 2017-11-24 MED ORDER — LABETALOL HCL 100 MG PO TABS
100.0000 mg | ORAL_TABLET | Freq: Two times a day (BID) | ORAL | Status: DC
  Administered 2017-11-24: 100 mg via ORAL
  Filled 2017-11-24: qty 1

## 2017-11-24 MED ORDER — BETAMETHASONE SOD PHOS & ACET 6 (3-3) MG/ML IJ SUSP
12.0000 mg | Freq: Once | INTRAMUSCULAR | Status: AC
  Administered 2017-11-24: 12 mg via INTRAMUSCULAR

## 2017-11-24 NOTE — OB Triage Note (Signed)
Has been monitoring her blood pressure @ home. Reports her BPs at home are high. Denies epigastric pain, visual disturbances. Reports chronic HAs and migraines. Pain is 3 out of 10. Sydney James, Sydney James

## 2017-11-24 NOTE — OB Triage Note (Signed)
Discharge home. To return tomorrow, 11/23 around 1500 for second dose of betamethasone and BP check. Elaina HoopsElks, Evamaria Detore S

## 2017-11-25 ENCOUNTER — Inpatient Hospital Stay
Admission: RE | Admit: 2017-11-25 | Discharge: 2017-11-25 | Disposition: A | Discharge status: Home / Self Care | Attending: Obstetrics and Gynecology | Admitting: Obstetrics and Gynecology

## 2017-11-25 DIAGNOSIS — O139 Gestational [pregnancy-induced] hypertension without significant proteinuria, unspecified trimester: Secondary | ICD-10-CM | POA: Diagnosis present

## 2017-11-25 DIAGNOSIS — Z3A00 Weeks of gestation of pregnancy not specified: Secondary | ICD-10-CM | POA: Insufficient documentation

## 2017-11-25 MED ORDER — BETAMETHASONE SOD PHOS & ACET 6 (3-3) MG/ML IJ SUSP
INTRAMUSCULAR | Status: AC
  Administered 2017-11-25: 12 mg via INTRAMUSCULAR
  Filled 2017-11-25: qty 5

## 2017-11-25 MED ORDER — BETAMETHASONE SOD PHOS & ACET 6 (3-3) MG/ML IJ SUSP
12.0000 mg | Freq: Once | INTRAMUSCULAR | Status: AC
  Administered 2017-11-25: 12 mg via INTRAMUSCULAR

## 2017-11-26 ENCOUNTER — Encounter: Payer: Self-pay | Admitting: *Deleted

## 2017-11-26 ENCOUNTER — Inpatient Hospital Stay
Admission: EM | Admit: 2017-11-26 | Discharge: 2017-11-26 | Disposition: A | Discharge status: Home / Self Care | Attending: Obstetrics and Gynecology | Admitting: Obstetrics and Gynecology

## 2017-11-26 DIAGNOSIS — O133 Gestational [pregnancy-induced] hypertension without significant proteinuria, third trimester: Secondary | ICD-10-CM | POA: Insufficient documentation

## 2017-11-26 DIAGNOSIS — Z3A30 30 weeks gestation of pregnancy: Secondary | ICD-10-CM | POA: Insufficient documentation

## 2017-11-26 DIAGNOSIS — O368130 Decreased fetal movements, third trimester, not applicable or unspecified: Secondary | ICD-10-CM | POA: Insufficient documentation

## 2017-11-26 DIAGNOSIS — O163 Unspecified maternal hypertension, third trimester: Secondary | ICD-10-CM

## 2017-11-26 DIAGNOSIS — R51 Headache: Secondary | ICD-10-CM | POA: Diagnosis present

## 2017-11-26 LAB — PROTEIN / CREATININE RATIO, URINE
CREATININE, URINE: 63 mg/dL
Protein Creatinine Ratio: 0.14 mg/mg{Cre} (ref 0.00–0.15)
Total Protein, Urine: 9 mg/dL

## 2017-11-26 MED ORDER — ACETAMINOPHEN 500 MG PO TABS
ORAL_TABLET | ORAL | Status: AC
  Filled 2017-11-26: qty 2

## 2017-11-26 MED ORDER — ACETAMINOPHEN 500 MG PO TABS
1000.0000 mg | ORAL_TABLET | Freq: Four times a day (QID) | ORAL | Status: DC | PRN
  Administered 2017-11-26: 1000 mg via ORAL

## 2017-11-26 NOTE — Discharge Instructions (Signed)
Hypertension During Pregnancy °Hypertension is also called high blood pressure. High blood pressure means that the force of your blood moving in your body is too strong. When you are pregnant, this condition should be watched carefully. It can cause problems for you and your baby. °Follow these instructions at home: °Eating and drinking °· Drink enough fluid to keep your pee (urine) clear or pale yellow. °· Eat healthy foods that are low in salt (sodium). °? Do not add salt to your food. °? Check labels on foods and drinks to see much salt is in them. Look on the label where you see "Sodium." °Lifestyle °· Do not use any products that contain nicotine or tobacco, such as cigarettes and e-cigarettes. If you need help quitting, ask your doctor. °· Do not use alcohol. °· Avoid caffeine. °· Avoid stress. Rest and get plenty of sleep. °General instructions °· Take over-the-counter and prescription medicines only as told by your doctor. °· While lying down, lie on your left side. This keeps pressure off your baby. °· While sitting or lying down, raise (elevate) your feet. Try putting some pillows under your lower legs. °· Exercise regularly. Ask your doctor what kinds of exercise are best for you. °· Keep all prenatal and follow-up visits as told by your doctor. This is important. °Contact a doctor if: °· You have symptoms that your doctor told you to watch for, such as: °? Fever. °? Throwing up (vomiting). °? Headache. °Get help right away if: °· You have very bad pain in your belly (abdomen). °· You are throwing up, and this does not get better with treatment. °· You suddenly get swelling in your hands, ankles, or face. °· You gain 4 lb (1.8 kg) or more in 1 week. °· You get bleeding from your vagina. °· You have blood in your pee. °· You do not feel your baby moving as much as normal. °· You have a change in vision. °· You have muscle twitching or sudden tightening (spasms). °· You have trouble breathing. °· Your lips  or fingernails turn blue. °This information is not intended to replace advice given to you by your health care provider. Make sure you discuss any questions you have with your health care provider. °Document Released: 01/22/2010 Document Revised: 09/01/2015 Document Reviewed: 09/01/2015 °Elsevier Interactive Patient Education © 2018 [employer] Inc[employer] °Preeclampsia and Eclampsia °Preeclampsia is a serious condition that develops only during pregnancy. It is also called toxemia of pregnancy. This condition causes high blood pressure along with other symptoms, such as swelling and headaches. These symptoms may develop as the condition gets worse. Preeclampsia may occur at 20 weeks of pregnancy or later. °Diagnosing and treating preeclampsia early is very important. If not treated early, it can cause serious problems for you and your baby. One problem it can lead to is eclampsia, which is a condition that causes muscle jerking or shaking (convulsions or seizures) in the mother. Delivering your baby is the best treatment for preeclampsia or eclampsia. Preeclampsia and eclampsia symptoms usually go away after your baby is born. °What are the causes? °The cause of preeclampsia is not known. °What increases the risk? °The following risk factors make you more likely to develop preeclampsia: °· Being pregnant for the first time. °· Having had preeclampsia during a past pregnancy. °· Having a family history of preeclampsia. °· Having high blood pressure. °· Being pregnant with twins or triplets. °· Being 35 or older. °· Being African-American. °· Having kidney disease or diabetes. °·   Having medical conditions such as lupus or blood diseases. °· Being very overweight (obese). ° °What are the signs or symptoms? °The earliest signs of preeclampsia are: °· High blood pressure. °· Increased protein in your urine. Your health care provider will check for this at every visit before you give birth (prenatal visit). ° °Other symptoms that  may develop as the condition gets worse include: °· Severe headaches. °· Sudden weight gain. °· Swelling of the hands, face, legs, and feet. °· Nausea and vomiting. °· Vision problems, such as blurred or double vision. °· Numbness in the face, arms, legs, and feet. °· Urinating less than usual. °· Dizziness. °· Slurred speech. °· Abdominal pain, especially upper abdominal pain. °· Convulsions or seizures. ° °Symptoms generally go away after giving birth. °How is this diagnosed? °There are no screening tests for preeclampsia. Your health care provider will ask you about symptoms and check for signs of preeclampsia during your prenatal visits. You may also have tests that include: °· Urine tests. °· Blood tests. °· Checking your blood pressure. °· Monitoring your baby’s heart rate. °· Ultrasound. ° °How is this treated? °You and your health care provider will determine the treatment approach that is best for you. Treatment may include: °· Having more frequent prenatal exams to check for signs of preeclampsia, if you have an increased risk for preeclampsia. °· Bed rest. °· Reducing how much salt (sodium) you eat. °· Medicine to lower your blood pressure. °· Staying in the hospital, if your condition is severe. There, treatment will focus on controlling your blood pressure and the amount of fluids in your body (fluid retention). °· You may need to take medicine (magnesium sulfate) to prevent seizures. This medicine may be given as an injection or through an IV tube. °· Delivering your baby early, if your condition gets worse. You may have your labor started with medicine (induced), or you may have a cesarean delivery. ° °Follow these instructions at home: °Eating and drinking ° °· Drink enough fluid to keep your urine clear or pale yellow. °· Eat a healthy diet that is low in sodium. Do not add salt to your food. Check nutrition labels to see how much sodium a food or beverage contains. °· Avoid  caffeine. °Lifestyle °· Do not use any products that contain nicotine or tobacco, such as cigarettes and e-cigarettes. If you need help quitting, ask your health care provider. °· Do not use alcohol or drugs. °· Avoid stress as much as possible. Rest and get plenty of sleep. °General instructions °· Take over-the-counter and prescription medicines only as told by your health care provider. °· When lying down, lie on your side. This keeps pressure off of your baby. °· When sitting or lying down, raise (elevate) your feet. Try putting some pillows underneath your lower legs. °· Exercise regularly. Ask your health care provider what kinds of exercise are best for you. °· Keep all follow-up and prenatal visits as told by your health care provider. This is important. °How is this prevented? °To prevent preeclampsia or eclampsia from developing during another pregnancy: °· Get proper medical care during pregnancy. Your health care provider may be able to prevent preeclampsia or diagnose and treat it early. °· Your health care provider may have you take a low-dose aspirin or a calcium supplement during your next pregnancy. °· You may have tests of your blood pressure and kidney function after giving birth. °· Maintain a healthy weight. Ask your health care provider for   help managing weight gain during pregnancy. °· Work with your health care provider to manage any long-term (chronic) health conditions you have, such as diabetes or kidney problems. ° °Contact a health care provider if: °· You gain more weight than expected. °· You have headaches. °· You have nausea or vomiting. °· You have abdominal pain. °· You feel dizzy or light-headed. °Get help right away if: °· You develop sudden or severe swelling anywhere in your body. This usually happens in the legs. °· You gain 5 lbs (2.3 kg) or more during one week. °· You have severe: °? Abdominal pain. °? Headaches. °? Dizziness. °? Vision problems. °? Confusion. °? Nausea or  vomiting. °· You have a seizure. °· You have trouble moving any part of your body. °· You develop numbness in any part of your body. °· You have trouble speaking. °· You have any abnormal bleeding. °· You pass out. °This information is not intended to replace advice given to you by your health care provider. Make sure you discuss any questions you have with your health care provider. °Document Released: 12/18/1999 Document Revised: 08/18/2015 Document Reviewed: 07/27/2015 °Elsevier Interactive Patient Education © 2018 Elsevier Inc. ° °

## 2017-11-26 NOTE — OB Triage Note (Signed)
Presents with complaint of elevated BP at home and complains of headache, states shaky feeling, and some visual flashing that comes and goes and complains of some upper abdominal pain. Pt states that she has not taken anything for her headache because it is dull feeling. Also states decreased fetal movement

## 2017-11-26 NOTE — OB Triage Provider Note (Signed)
L&D OB Triage Note  Joella PrinceMichaela Cowden is a 22 y.o. [id number] female at 7638w2d, EDD Estimated Date of Delivery: 02/02/18 who presented to triage for complaints of elevated blood pressure at home with headache x 3 days. .  She was evaluated by me with no significant findings/findings significant for worsening gestational hypertension. Vital signs stable. P/C ratio 120.An NST was performed and has been reviewed by MD. She was treated with tylenol 1000mg  for the headache and it has eased off. .   NST INTERPRETATION: Indications: decreased fetal movement and pregnancy-induced hypertension  Mode: External Baseline Rate (A): 145 bpm Variability: Moderate Accelerations: 10 x 10 Decelerations: None     Contraction Frequency (min): none  Impression: reactive   Plan: NST performed was reviewed and was found to be reactive. She was discharged home with bleeding/labor precautions/PIH precautions and to remain on modified bedrest..  Continue routine prenatal care. Follow up with OB/GYN as previously scheduled.     Nandita Mathenia Suzan NailerN Desi Rowe, CNM

## 2017-11-28 ENCOUNTER — Observation Stay
Admission: EM | Admit: 2017-11-28 | Discharge: 2017-11-29 | Disposition: A | Discharge status: Home / Self Care | Attending: Certified Nurse Midwife | Admitting: Certified Nurse Midwife

## 2017-11-28 ENCOUNTER — Other Ambulatory Visit: Payer: Self-pay

## 2017-11-28 DIAGNOSIS — Z888 Allergy status to other drugs, medicaments and biological substances status: Secondary | ICD-10-CM | POA: Diagnosis not present

## 2017-11-28 DIAGNOSIS — O09219 Supervision of pregnancy with history of pre-term labor, unspecified trimester: Secondary | ICD-10-CM | POA: Insufficient documentation

## 2017-11-28 DIAGNOSIS — O139 Gestational [pregnancy-induced] hypertension without significant proteinuria, unspecified trimester: Secondary | ICD-10-CM

## 2017-11-28 DIAGNOSIS — O133 Gestational [pregnancy-induced] hypertension without significant proteinuria, third trimester: Secondary | ICD-10-CM | POA: Diagnosis not present

## 2017-11-28 DIAGNOSIS — Z882 Allergy status to sulfonamides status: Secondary | ICD-10-CM | POA: Diagnosis not present

## 2017-11-28 DIAGNOSIS — Z3A30 30 weeks gestation of pregnancy: Secondary | ICD-10-CM | POA: Diagnosis not present

## 2017-11-28 DIAGNOSIS — O26893 Other specified pregnancy related conditions, third trimester: Secondary | ICD-10-CM | POA: Diagnosis not present

## 2017-11-28 DIAGNOSIS — Z0489 Encounter for examination and observation for other specified reasons: Principal | ICD-10-CM | POA: Insufficient documentation

## 2017-11-28 DIAGNOSIS — R51 Headache: Secondary | ICD-10-CM | POA: Diagnosis not present

## 2017-11-28 DIAGNOSIS — Z8759 Personal history of other complications of pregnancy, childbirth and the puerperium: Secondary | ICD-10-CM

## 2017-11-28 DIAGNOSIS — Z8751 Personal history of pre-term labor: Secondary | ICD-10-CM

## 2017-11-28 MED ORDER — BUTALBITAL-APAP-CAFFEINE 50-325-40 MG PO TABS: 2.0000 | ORAL_TABLET | Freq: Four times a day (QID) | ORAL | Status: DC | PRN

## 2017-11-28 NOTE — OB Triage Note (Signed)
Pt arrival to triage with c/o elevated Bps taken from home.  Pt states she has had a HA for a week, took Tylenol around 1245.  Pain 5/10.  Pt denies epigastric pain and blurred vision.  Pt denies contractions, vaginal bleeding and LOF.  Is feeling baby move normally.  EFM and toco applied and assessing.

## 2017-11-29 ENCOUNTER — Ambulatory Visit: Admitting: Certified Nurse Midwife

## 2017-11-29 ENCOUNTER — Ambulatory Visit (INDEPENDENT_AMBULATORY_CARE_PROVIDER_SITE_OTHER): Admitting: Certified Nurse Midwife

## 2017-11-29 VITALS — BP 130/94 | HR 67 | Wt 230.1 lb

## 2017-11-29 VITALS — BP 130/84 | HR 63 | Ht 67.0 in | Wt 230.0 lb

## 2017-11-29 DIAGNOSIS — O09293 Supervision of pregnancy with other poor reproductive or obstetric history, third trimester: Secondary | ICD-10-CM

## 2017-11-29 DIAGNOSIS — O09213 Supervision of pregnancy with history of pre-term labor, third trimester: Secondary | ICD-10-CM

## 2017-11-29 DIAGNOSIS — Z3493 Encounter for supervision of normal pregnancy, unspecified, third trimester: Secondary | ICD-10-CM

## 2017-11-29 DIAGNOSIS — O133 Gestational [pregnancy-induced] hypertension without significant proteinuria, third trimester: Secondary | ICD-10-CM

## 2017-11-29 DIAGNOSIS — Z3A30 30 weeks gestation of pregnancy: Secondary | ICD-10-CM

## 2017-11-29 DIAGNOSIS — Z3483 Encounter for supervision of other normal pregnancy, third trimester: Secondary | ICD-10-CM

## 2017-11-29 DIAGNOSIS — Z0489 Encounter for examination and observation for other specified reasons: Secondary | ICD-10-CM | POA: Diagnosis not present

## 2017-11-29 LAB — POCT URINALYSIS DIPSTICK OB
Bilirubin, UA: NEGATIVE
Blood, UA: NEGATIVE
Glucose, UA: NEGATIVE
Ketones, UA: NEGATIVE
LEUKOCYTES UA: NEGATIVE
NITRITE UA: NEGATIVE
PH UA: 7 (ref 5.0–8.0)
PROTEIN: NEGATIVE
Spec Grav, UA: 1.01 (ref 1.010–1.025)
UROBILINOGEN UA: 0.2 U/dL

## 2017-11-29 LAB — COMPREHENSIVE METABOLIC PANEL
ALBUMIN: 2.7 g/dL — AB (ref 3.5–5.0)
ALT: 33 U/L (ref 0–44)
AST: 31 U/L (ref 15–41)
Alkaline Phosphatase: 72 U/L (ref 38–126)
Anion gap: 9 (ref 5–15)
BUN: 13 mg/dL (ref 6–20)
CHLORIDE: 110 mmol/L (ref 98–111)
CO2: 19 mmol/L — ABNORMAL LOW (ref 22–32)
Calcium: 8.3 mg/dL — ABNORMAL LOW (ref 8.9–10.3)
Creatinine, Ser: 0.52 mg/dL (ref 0.44–1.00)
GFR calc Af Amer: >60 mL/min (ref >60)
GLUCOSE: 99 mg/dL (ref 70–99)
Potassium: 3 mmol/L — ABNORMAL LOW (ref 3.5–5.1)
Sodium: 138 mmol/L (ref 135–145)
Total Bilirubin: 0.3 mg/dL (ref 0.3–1.2)
Total Protein: 5.8 g/dL — ABNORMAL LOW (ref 6.5–8.1)

## 2017-11-29 LAB — CBC
HCT: 27.9 % — ABNORMAL LOW (ref 36.0–46.0)
Hemoglobin: 9.6 g/dL — ABNORMAL LOW (ref 12.0–15.0)
MCH: 30.5 pg (ref 26.0–34.0)
MCHC: 34.4 g/dL (ref 30.0–36.0)
MCV: 88.6 fL (ref 80.0–100.0)
PLATELETS: 244 10*3/uL (ref 150–400)
RBC: 3.15 MIL/uL — ABNORMAL LOW (ref 3.87–5.11)
RDW: 12.3 % (ref 11.5–15.5)
WBC: 11.3 10*3/uL — ABNORMAL HIGH (ref 4.0–10.5)
nRBC: 0 % (ref 0.0–0.2)

## 2017-11-29 LAB — PROTEIN / CREATININE RATIO, URINE
CREATININE, URINE: 78 mg/dL
Protein Creatinine Ratio: 0.15 mg/mg{Cre} (ref 0.00–0.15)
Total Protein, Urine: 12 mg/dL

## 2017-11-29 MED ORDER — LABETALOL HCL 100 MG PO TABS: 200.0000 mg | ORAL_TABLET | Freq: Two times a day (BID) | ORAL | 1 refills | Status: DC

## 2017-11-29 MED ORDER — POTASSIUM CHLORIDE CRYS ER 20 MEQ PO TBCR: 40.0000 meq | EXTENDED_RELEASE_TABLET | Freq: Every day | ORAL | 0 refills | Status: DC

## 2017-11-29 NOTE — Discharge Summary (Signed)
Obstetric Discharge Summary  Patient ID: Sydney James MRN: [id number] DOB/AGE: September 14, 1995 22 y.o.   Date of Admission: 11/28/2017  Date of Discharge: 11/29/17  Admitting Diagnosis: Observation at [redacted]w[redacted]d  Secondary Diagnosis: Gestational hypertension, History of pre-eclampsia, History of preterm birth, status post betamethasone     Discharge Diagnosis: No other diagnosis   Antepartum Procedures: NST    Brief Hospital Course   L&D OB Triage Note  Sydney James is a 22 y.o. [id number] female at [redacted]w[redacted]d, EDD Estimated Date of Delivery: 02/02/18 who presented to triage for complaints of elevated blood pressures when taken at home and intermittent headache for the last week. Last treated with Tylenol at 1245. She was evaluated by myself with no significant findings for pre-eclampsia. Vital signs stable. An NST was performed and has been reviewed by CNM. She declined Fioricet for treatment of headache.   NST INTERPRETATION:  Indications: Gestational hypertension  Mode: External Baseline Rate (A): 135 bpm Variability: Moderate Accelerations: 15 x 15 Decelerations: None Contraction Frequency (min): none  Impression: reactive  Recent Results (from the past 2160 hour(s))   Collection Time: 11/28/17 11:36 PM  Result Value Ref Range   Creatinine, Urine 78 mg/dL   Total Protein, Urine 12 mg/dL    Comment: NO NORMAL RANGE ESTABLISHED FOR THIS TEST   Protein Creatinine Ratio 0.15 0.00 - 0.15 mg/mg[Cre]    Comment: Performed at [facility], [address] [place], [place] [address]  CBC     Status: Abnormal   Collection Time: 11/28/17 11:52 PM  Result Value Ref Range   WBC 11.3 (H) 4.0 - 10.5 K/uL   RBC 3.15 (L) 3.87 - 5.11 MIL/uL   Hemoglobin 9.6 (L) 12.0 - 15.0 g/dL   HCT 95.6 (L) 21.3 - 08.6 %   MCV 88.6 80.0 - 100.0 fL   MCH 30.5 26.0 - 34.0 pg   MCHC 34.4 30.0 - 36.0 g/dL   RDW 57.8 46.9 - 62.9 %   Platelets 244 150 - 400 K/uL   nRBC 0.0 0.0 - 0.2 %     Comment: Performed at [facility], [address] [place], [place] [address]  Comprehensive metabolic panel     Status: Abnormal   Collection Time: 11/28/17 11:52 PM  Result Value Ref Range   Sodium 138 135 - 145 mmol/L   Potassium 3.0 (L) 3.5 - 5.1 mmol/L   Chloride 110 98 - 111 mmol/L   CO2 19 (L) 22 - 32 mmol/L   Glucose, Bld 99 70 - 99 mg/dL   BUN 13 6 - 20 mg/dL   Creatinine, Ser 3.24 0.44 - 1.00 mg/dL   Calcium 8.3 (L) 8.9 - 10.3 mg/dL   Total Protein 5.8 (L) 6.5 - 8.1 g/dL   Albumin 2.7 (L) 3.5 - 5.0 g/dL   AST 31 15 - 41 U/L   ALT 33 0 - 44 U/L   Alkaline Phosphatase 72 38 - 126 U/L   Total Bilirubin 0.3 0.3 - 1.2 mg/dL   GFR calc non Af Amer >60 >60 mL/min   GFR calc Af Amer >60 >60 mL/min   Anion gap 9 5 - 15    Comment: Performed at [facility], [address] [place], [place] [address]   Plan: NST performed was reviewed and was found to be reactive. She was discharged home with bleeding/labor precautions.  Continue routine prenatal care. Follow up with OB/GYN as previously scheduled.   Discharge Instructions: Per After Visit Summary. Activity: Advance as tolerated.  Pelvic rest for 6 weeks.  Also refer to After Visit Summary Diet: Regular Medications: Allergies as of 11/29/2017      Reactions   Aloe Hives   Lubricants    Has to have water based lubricant. Allergy to non-water based lubricant   Sulfa Antibiotics Itching, Rash   Tape Itching, Rash      Medication List    TAKE these medications   ABDOMINAL SUPPORT/L-XL Misc 1 each by Does not apply route daily. Dispense one (1) abdominal support, [id number]. Diagnosis code: M54.5   aspirin EC 81 MG tablet Take 1 tablet (81 mg total) by mouth daily. Take after 12 weeks for prevention of preeclampssia later in pregnancy   butalbital-acetaminophen-caffeine 50-325-40-30 MG capsule Commonly known as:  FIORICET WITH CODEINE Take 1 capsule by mouth every 4 (four) hours as needed for headache.    hydrOXYzine 25 MG tablet Commonly known as:  ATARAX/VISTARIL Take 1 tablet (25 mg total) by mouth 3 (three) times daily as needed (contractions).   labetalol 100 MG tablet Commonly known as:  NORMODYNE Take 2 tablets (200 mg total) by mouth 2 (two) times daily. What changed:  how much to take   Magnesium 200 MG Tabs Take 1 tablet (200 mg total) by mouth 2 (two) times daily.   metoCLOPramide 10 MG tablet Commonly known as:  REGLAN Take 1 tablet (10 mg total) by mouth 4 (four) times daily as needed for nausea or vomiting.   potassium chloride SA 20 MEQ tablet Commonly known as:  K-DUR,KLOR-CON Take 2 tablets (40 mEq total) by mouth daily for 5 days.   prenatal multivitamin Tabs tablet Take 1 tablet by mouth daily at 12 noon.      Outpatient follow up:  Follow-up Information    Doreene Burkehompson, Annie, CNM Follow up.   Specialties:  Certified Nurse Midwife, Radiology Why:  Today, as previously scheduled Contact information: [address] [place] [id number] [phone]          Discharged Condition: stable  Discharged to: home   Gunnar BullaJenkins Michelle Giulio Bertino, CNM [facility] 11/29/17 3:50 AM

## 2017-11-29 NOTE — Patient Instructions (Signed)
Preeclampsia and Eclampsia °Preeclampsia is a serious condition that develops only during pregnancy. It is also called toxemia of pregnancy. This condition causes high blood pressure along with other symptoms, such as swelling and headaches. These symptoms may develop as the condition gets worse. Preeclampsia may occur at 20 weeks of pregnancy or later. °Diagnosing and treating preeclampsia early is very important. If not treated early, it can cause serious problems for you and your baby. One problem it can lead to is eclampsia, which is a condition that causes muscle jerking or shaking (convulsions or seizures) in the mother. Delivering your baby is the best treatment for preeclampsia or eclampsia. Preeclampsia and eclampsia symptoms usually go away after your baby is born. °What are the causes? °The cause of preeclampsia is not known. °What increases the risk? °The following risk factors make you more likely to develop preeclampsia: °· Being pregnant for the first time. °· Having had preeclampsia during a past pregnancy. °· Having a family history of preeclampsia. °· Having high blood pressure. °· Being pregnant with twins or triplets. °· Being 35 or older. °· Being African-American. °· Having kidney disease or diabetes. °· Having medical conditions such as lupus or blood diseases. °· Being very overweight (obese). ° °What are the signs or symptoms? °The earliest signs of preeclampsia are: °· High blood pressure. °· Increased protein in your urine. Your health care provider will check for this at every visit before you give birth (prenatal visit). ° °Other symptoms that may develop as the condition gets worse include: °· Severe headaches. °· Sudden weight gain. °· Swelling of the hands, face, legs, and feet. °· Nausea and vomiting. °· Vision problems, such as blurred or double vision. °· Numbness in the face, arms, legs, and feet. °· Urinating less than usual. °· Dizziness. °· Slurred speech. °· Abdominal pain,  especially upper abdominal pain. °· Convulsions or seizures. ° °Symptoms generally go away after giving birth. °How is this diagnosed? °There are no screening tests for preeclampsia. Your health care provider will ask you about symptoms and check for signs of preeclampsia during your prenatal visits. You may also have tests that include: °· Urine tests. °· Blood tests. °· Checking your blood pressure. °· Monitoring your baby’s heart rate. °· Ultrasound. ° °How is this treated? °You and your health care provider will determine the treatment approach that is best for you. Treatment may include: °· Having more frequent prenatal exams to check for signs of preeclampsia, if you have an increased risk for preeclampsia. °· Bed rest. °· Reducing how much salt (sodium) you eat. °· Medicine to lower your blood pressure. °· Staying in the hospital, if your condition is severe. There, treatment will focus on controlling your blood pressure and the amount of fluids in your body (fluid retention). °· You may need to take medicine (magnesium sulfate) to prevent seizures. This medicine may be given as an injection or through an IV tube. °· Delivering your baby early, if your condition gets worse. You may have your labor started with medicine (induced), or you may have a cesarean delivery. ° °Follow these instructions at home: °Eating and drinking ° °· Drink enough fluid to keep your urine clear or pale yellow. °· Eat a healthy diet that is low in sodium. Do not add salt to your food. Check nutrition labels to see how much sodium a food or beverage contains. °· Avoid caffeine. °Lifestyle °· Do not use any products that contain nicotine or tobacco, such as cigarettes   and e-cigarettes. If you need help quitting, ask your health care provider. °· Do not use alcohol or drugs. °· Avoid stress as much as possible. Rest and get plenty of sleep. °General instructions °· Take over-the-counter and prescription medicines only as told by your  health care provider. °· When lying down, lie on your side. This keeps pressure off of your baby. °· When sitting or lying down, raise (elevate) your feet. Try putting some pillows underneath your lower legs. °· Exercise regularly. Ask your health care provider what kinds of exercise are best for you. °· Keep all follow-up and prenatal visits as told by your health care provider. This is important. °How is this prevented? °To prevent preeclampsia or eclampsia from developing during another pregnancy: °· Get proper medical care during pregnancy. Your health care provider may be able to prevent preeclampsia or diagnose and treat it early. °· Your health care provider may have you take a low-dose aspirin or a calcium supplement during your next pregnancy. °· You may have tests of your blood pressure and kidney function after giving birth. °· Maintain a healthy weight. Ask your health care provider for help managing weight gain during pregnancy. °· Work with your health care provider to manage any long-term (chronic) health conditions you have, such as diabetes or kidney problems. ° °Contact a health care provider if: °· You gain more weight than expected. °· You have headaches. °· You have nausea or vomiting. °· You have abdominal pain. °· You feel dizzy or light-headed. °Get help right away if: °· You develop sudden or severe swelling anywhere in your body. This usually happens in the legs. °· You gain 5 lbs (2.3 kg) or more during one week. °· You have severe: °? Abdominal pain. °? Headaches. °? Dizziness. °? Vision problems. °? Confusion. °? Nausea or vomiting. °· You have a seizure. °· You have trouble moving any part of your body. °· You develop numbness in any part of your body. °· You have trouble speaking. °· You have any abnormal bleeding. °· You pass out. °This information is not intended to replace advice given to you by your health care provider. Make sure you discuss any questions you have with your health  care provider. °Document Released: 12/18/1999 Document Revised: 08/18/2015 Document Reviewed: 07/27/2015 °Elsevier Interactive Patient Education © 2018 Elsevier Inc. ° °

## 2017-11-29 NOTE — Progress Notes (Signed)
Pt here for BP check per Doreene BurkeAnnie Thompson  BP 130/84   Pulse 63   Ht 5\' 7"  (1.702 m)   Wt 230 lb (104.3 kg)   LMP 04/28/2017   BMI 36.02 kg/m

## 2017-11-29 NOTE — OB Triage Note (Signed)
Discharge instructions provided and reviewed.  Prescriptions reviewed and follow up care discussed.  Pt verbalizes understanding.

## 2017-11-29 NOTE — Progress Notes (Signed)
[name] pt seen in L&D last night for BP and headache. Labetalol dose increased to 200 mg BID. She has not taken her dose today. Pt instructed to get prescription take meds and return this afternoon for BP check. She feels good movement and has a mild headache which she feels is from lack of sleep. Left L&D at 0500.  Reflexes +1, swelling 2+ bilaterally, no epigastric pain, no visual changes. Reviewed S&S of pre e . Follow up this afternoon .   Doreene BurkeAnnie Yasenia Reedy, CNM

## 2017-12-05 ENCOUNTER — Ambulatory Visit (INDEPENDENT_AMBULATORY_CARE_PROVIDER_SITE_OTHER): Admitting: Certified Nurse Midwife

## 2017-12-05 VITALS — BP 123/83 | HR 88 | Wt 223.4 lb

## 2017-12-05 DIAGNOSIS — O133 Gestational [pregnancy-induced] hypertension without significant proteinuria, third trimester: Secondary | ICD-10-CM

## 2017-12-05 DIAGNOSIS — Z3483 Encounter for supervision of other normal pregnancy, third trimester: Secondary | ICD-10-CM

## 2017-12-05 LAB — POCT URINALYSIS DIPSTICK OB
BILIRUBIN UA: NEGATIVE
Glucose, UA: NEGATIVE
KETONES UA: NEGATIVE
Leukocytes, UA: NEGATIVE
NITRITE UA: NEGATIVE
PH UA: 6.5 (ref 5.0–8.0)
PROTEIN: NEGATIVE
RBC UA: NEGATIVE
Spec Grav, UA: 1.02 (ref 1.010–1.025)
UROBILINOGEN UA: 0.2 U/dL

## 2017-12-05 NOTE — Progress Notes (Signed)
[name]-Reports daily headaches, but has noticed improvement since starting soft diet. 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 Denies other signs and symptoms of pre-eclampsia. Dr. Valentino Saxonherry consulted for medication treatment options. Discussed other treatment options including oxycodone prn or Amitriptyline daily, pt declines at this time. The home BP readings have been in the 150-170s / 88-108's range. Blood pressure in office today stable, will start NST weekly. Anticipatory guidance regarding course of prenatal care and recommendations for induction of labor between 37-39 weeks for gestational hypertension, pt verbalized understanding. Reviewed red flag symptoms and when to call. RTC x Friday for NST. RTC x 1 week for growth US/BPP and [name] or sooner if needed.

## 2017-12-05 NOTE — Patient Instructions (Signed)
Hypertension During Pregnancy Hypertension is also called high blood pressure. High blood pressure means that the force of your blood moving in your body is too strong. When you are pregnant, this condition should be watched carefully. It can cause problems for you and your baby. Follow these instructions at home: Eating and drinking  Drink enough fluid to keep your pee (urine) clear or pale yellow.  Eat healthy foods that are low in salt (sodium). ? Do not add salt to your food. ? Check labels on foods and drinks to see much salt is in them. Look on the label where you see "Sodium." Lifestyle  Do not use any products that contain nicotine or tobacco, such as cigarettes and e-cigarettes. If you need help quitting, ask your doctor.  Do not use alcohol.  Avoid caffeine.  Avoid stress. Rest and get plenty of sleep. General instructions  Take over-the-counter and prescription medicines only as told by your doctor.  While lying down, lie on your left side. This keeps pressure off your baby.  While sitting or lying down, raise (elevate) your feet. Try putting some pillows under your lower legs.  Exercise regularly. Ask your doctor what kinds of exercise are best for you.  Keep all prenatal and follow-up visits as told by your doctor. This is important. Contact a doctor if:  You have symptoms that your doctor told you to watch for, such as: ? Fever. ? Throwing up (vomiting). ? Headache. Get help right away if:  You have very bad pain in your belly (abdomen).  You are throwing up, and this does not get better with treatment.  You suddenly get swelling in your hands, ankles, or face.  You gain 4 lb (1.8 kg) or more in 1 week.  You get bleeding from your vagina.  You have blood in your pee.  You do not feel your baby moving as much as normal.  You have a change in vision.  You have muscle twitching or sudden tightening (spasms).  You have trouble breathing.  Your lips  or fingernails turn blue. This information is not intended to replace advice given to you by your health care provider. Make sure you discuss any questions you have with your health care provider. Document Released: 01/22/2010 Document Revised: 09/01/2015 Document Reviewed: 09/01/2015 Elsevier Interactive Patient Education  2018 [employer] Fetal Movement Counts Patient Name: ________________________________________________ Patient Due Date: ____________________ What is a fetal movement count? A fetal movement count is the number of times that you feel your baby move during a certain amount of time. This may also be called a fetal kick count. A fetal movement count is recommended for every pregnant woman. You may be asked to start counting fetal movements as early as week 28 of your pregnancy. Pay attention to when your baby is most active. You may notice your baby's sleep and wake cycles. You may also notice things that make your baby move more. You should do a fetal movement count:  When your baby is normally most active.  At the same time each day.  A good time to count movements is while you are resting, after having something to eat and drink. How do I count fetal movements? 1. Find a quiet, comfortable area. Sit, or lie down on your side. 2. Write down the date, the start time and stop time, and the number of movements that you felt between those two times. Take this information with you to your health care visits. 3. For  2 hours, count kicks, flutters, swishes, rolls, and jabs. You should feel at least 10 movements during 2 hours. 4. You may stop counting after you have felt 10 movements. 5. If you do not feel 10 movements in 2 hours, have something to eat and drink. Then, keep resting and counting for 1 hour. If you feel at least 4 movements during that hour, you may stop counting. Contact a health care provider if:  You feel fewer than 4 movements in 2 hours.  Your baby is not  moving like he or she usually does. Date: ____________ Start time: ____________ Stop time: ____________ Movements: ____________ Date: ____________ Start time: ____________ Stop time: ____________ Movements: ____________ Date: ____________ Start time: ____________ Stop time: ____________ Movements: ____________ Date: ____________ Start time: ____________ Stop time: ____________ Movements: ____________ Date: ____________ Start time: ____________ Stop time: ____________ Movements: ____________ Date: ____________ Start time: ____________ Stop time: ____________ Movements: ____________ Date: ____________ Start time: ____________ Stop time: ____________ Movements: ____________ Date: ____________ Start time: ____________ Stop time: ____________ Movements: ____________ Date: ____________ Start time: ____________ Stop time: ____________ Movements: ____________ This information is not intended to replace advice given to you by your health care provider. Make sure you discuss any questions you have with your health care provider. Document Released: 01/19/2006 Document Revised: 08/19/2015 Document Reviewed: 01/29/2015 Elsevier Interactive Patient Education  2018 [employer]. Nonstress Test The nonstress test is a procedure that monitors the fetus's heartbeat. The test will monitor the heartbeat when the fetus is at rest and while the fetus is moving. In a healthy fetus, there will be an increase in fetal heart rate when the fetus moves or kicks. The heart rate will decrease at rest. This test helps determine if the fetus is healthy. Your health care provider will look at a number of patterns in the heart rate tracing to make sure your baby is thriving. If there is concern, your health care provider may order additional tests or may suggest another course of action. This test is often done in the third trimester and can help determine if an early delivery is needed and safe. Common reasons to have this test  are:  You are past your due date.  You have a high-risk pregnancy.  You are feeling less movement than normal.  You have lost a pregnancy in the past.  Your health care provider suspects fetal growth problems.  You have too much or too little amniotic fluid.  What happens before the procedure?  Eat a meal right before the test or as directed by your health care provider. Food may help stimulate fetal movements.  Use the restroom right before the test. What happens during the procedure?  Two belts will be placed around your abdomen. These belts have monitors attached to them. One records the fetal heart rate and the other records uterine contractions.  You may be asked to lie down on your side or to stay sitting upright.  You may be given a button to press when you feel movement.  The fetal heartbeat is listened to and watched on a screen. The heartbeat is recorded on a sheet of paper.  If the fetus seems to be sleeping, you may be asked to drink some juice or soda, gently press your abdomen, or make some noise to wake the fetus. What happens after the procedure? Your health care provider will discuss the test results with you and make recommendations for the near future.  This information is not intended to replace  advice given to you by your health care provider. Make sure you discuss any questions you have with your health care provider. This information is not intended to replace advice given to you by your health care provider. Make sure you discuss any questions you have with your health care provider. Document Released: 12/10/2001 Document Revised: 11/20/2015 Document Reviewed: 01/24/2012 Elsevier Interactive Patient Education  [employer].

## 2017-12-05 NOTE — Progress Notes (Signed)
[name].  Patient c/o daily headache for the past week, taking Tylenol or Fioricet with minimal relief. Patient checking BP at home and getting elevated readings usually at night time 155-170/88-108.

## 2017-12-08 ENCOUNTER — Telehealth: Payer: Self-pay | Admitting: Certified Nurse Midwife

## 2017-12-08 ENCOUNTER — Other Ambulatory Visit

## 2017-12-08 ENCOUNTER — Ambulatory Visit (INDEPENDENT_AMBULATORY_CARE_PROVIDER_SITE_OTHER): Admitting: Certified Nurse Midwife

## 2017-12-08 ENCOUNTER — Telehealth: Payer: Self-pay

## 2017-12-08 VITALS — BP 140/98 | HR 76 | Wt 224.3 lb

## 2017-12-08 DIAGNOSIS — O26899 Other specified pregnancy related conditions, unspecified trimester: Secondary | ICD-10-CM

## 2017-12-08 DIAGNOSIS — O09293 Supervision of pregnancy with other poor reproductive or obstetric history, third trimester: Secondary | ICD-10-CM | POA: Diagnosis not present

## 2017-12-08 DIAGNOSIS — R1013 Epigastric pain: Secondary | ICD-10-CM | POA: Diagnosis not present

## 2017-12-08 DIAGNOSIS — Z3483 Encounter for supervision of other normal pregnancy, third trimester: Secondary | ICD-10-CM

## 2017-12-08 DIAGNOSIS — O133 Gestational [pregnancy-induced] hypertension without significant proteinuria, third trimester: Secondary | ICD-10-CM

## 2017-12-08 DIAGNOSIS — Z8759 Personal history of other complications of pregnancy, childbirth and the puerperium: Secondary | ICD-10-CM

## 2017-12-08 DIAGNOSIS — O26893 Other specified pregnancy related conditions, third trimester: Secondary | ICD-10-CM | POA: Diagnosis not present

## 2017-12-08 LAB — POCT URINALYSIS DIPSTICK OB
Bilirubin, UA: NEGATIVE
Glucose, UA: NEGATIVE
KETONES UA: NEGATIVE
Leukocytes, UA: NEGATIVE
NITRITE UA: NEGATIVE
RBC UA: NEGATIVE
SPEC GRAV UA: 1.02 (ref 1.010–1.025)
UROBILINOGEN UA: 0.2 U/dL
pH, UA: 6.5 (ref 5.0–8.0)

## 2017-12-08 LAB — CBC
HEMOGLOBIN: 11.1 g/dL (ref 11.1–15.9)
Hematocrit: 33.1 % — ABNORMAL LOW (ref 34.0–46.6)
MCH: 29.6 pg (ref 26.6–33.0)
MCHC: 33.5 g/dL (ref 31.5–35.7)
MCV: 88 fL (ref 79–97)
PLATELETS: 291 10*3/uL (ref 150–450)
RBC: 3.75 x10E6/uL — ABNORMAL LOW (ref 3.77–5.28)
RDW: 12.5 % (ref 12.3–15.4)
WBC: 11.3 10*3/uL — ABNORMAL HIGH (ref 3.4–10.8)

## 2017-12-08 LAB — COMPREHENSIVE METABOLIC PANEL
A/G RATIO: 1.5 (ref 1.2–2.2)
ALK PHOS: 104 IU/L (ref 39–117)
ALT: 16 IU/L (ref 0–32)
AST: 13 IU/L (ref 0–40)
Albumin: 3.5 g/dL (ref 3.5–5.5)
BUN/Creatinine Ratio: 9 (ref 9–23)
BUN: 5 mg/dL — ABNORMAL LOW (ref 6–20)
CHLORIDE: 102 mmol/L (ref 96–106)
CO2: 20 mmol/L (ref 20–29)
Calcium: 9.1 mg/dL (ref 8.7–10.2)
Creatinine, Ser: 0.56 mg/dL — ABNORMAL LOW (ref 0.57–1.00)
GFR calc Af Amer: 151 mL/min/{1.73_m2} (ref >59)
GFR, EST NON AFRICAN AMERICAN: 131 mL/min/{1.73_m2} (ref >59)
GLOBULIN, TOTAL: 2.4 g/dL (ref 1.5–4.5)
Glucose: 79 mg/dL (ref 65–99)
POTASSIUM: 3.9 mmol/L (ref 3.5–5.2)
SODIUM: 136 mmol/L (ref 134–144)
Total Protein: 5.9 g/dL — ABNORMAL LOW (ref 6.0–8.5)

## 2017-12-08 LAB — PROTEIN / CREATININE RATIO, URINE
Creatinine, Urine: 197.4 mg/dL
PROTEIN UR: 53.8 mg/dL
Protein/Creat Ratio: 273 mg/g creat — ABNORMAL HIGH (ref 0–200)

## 2017-12-08 NOTE — Telephone Encounter (Signed)
Telephone call to patient. HIPPA approved voicemail left on identified line with results. Continue medication as prescribed. Advised patient message would be sent via MyChart as well.    Gunnar BullaJenkins Michelle Thane Age, CNM [facility], [facility] 12/08/17 3:17 PM

## 2017-12-08 NOTE — Telephone Encounter (Signed)
mychart message sent

## 2017-12-08 NOTE — Progress Notes (Signed)
Patient here for NST, c/o constant upper right abdominal pain that started 2 days ago.   NONSTRESS TEST INTERPRETATION  INDICATIONS: Gestational hypertension  FHR baseline: 125 RESULTS:Reactive COMMENTS: Elevated systolic blood pressure, normal upon repeat   PLAN: 1. Continue fetal kick counts twice a day. 2. Continue antepartum testing as scheduled-Biweekly 3. Will collect PIH labs and schedule gallbladder ultrasound 4. Reviewed red flag symptoms and when to call   Gunnar BullaJenkins Michelle Lawhorn, CNM [facility], [facility]

## 2017-12-08 NOTE — Patient Instructions (Signed)
Nonstress Test The nonstress test is a procedure that monitors the fetus's heartbeat. The test will monitor the heartbeat when the fetus is at rest and while the fetus is moving. In a healthy fetus, there will be an increase in fetal heart rate when the fetus moves or kicks. The heart rate will decrease at rest. This test helps determine if the fetus is healthy. Your health care provider will look at a number of patterns in the heart rate tracing to make sure your baby is thriving. If there is concern, your health care provider may order additional tests or may suggest another course of action. This test is often done in the third trimester and can help determine if an early delivery is needed and safe. Common reasons to have this test are:  You are past your due date.  You have a high-risk pregnancy.  You are feeling less movement than normal.  You have lost a pregnancy in the past.  Your health care provider suspects fetal growth problems.  You have too much or too little amniotic fluid.  What happens before the procedure?  Eat a meal right before the test or as directed by your health care provider. Food may help stimulate fetal movements.  Use the restroom right before the test. What happens during the procedure?  Two belts will be placed around your abdomen. These belts have monitors attached to them. One records the fetal heart rate and the other records uterine contractions.  You may be asked to lie down on your side or to stay sitting upright.  You may be given a button to press when you feel movement.  The fetal heartbeat is listened to and watched on a screen. The heartbeat is recorded on a sheet of paper.  If the fetus seems to be sleeping, you may be asked to drink some juice or soda, gently press your abdomen, or make some noise to wake the fetus. What happens after the procedure? Your health care provider will discuss the test results with you and make recommendations  for the near future.  This information is not intended to replace advice given to you by your health care provider. Make sure you discuss any questions you have with your health care provider. This information is not intended to replace advice given to you by your health care provider. Make sure you discuss any questions you have with your health care provider. Document Released: 12/10/2001 Document Revised: 11/20/2015 Document Reviewed: 01/24/2012 Elsevier Interactive Patient Education  2018 [employer] Inc. Fetal Movement Counts Patient Name: ________________________________________________ Patient Due Date: ____________________ What is a fetal movement count? A fetal movement count is the number of times that you feel your baby move during a certain amount of time. This may also be called a fetal kick count. A fetal movement count is recommended for every pregnant woman. You may be asked to start counting fetal movements as early as week 28 of your pregnancy. Pay attention to when your baby is most active. You may notice your baby's sleep and wake cycles. You may also notice things that make your baby move more. You should do a fetal movement count:  When your baby is normally most active.  At the same time each day.  A good time to count movements is while you are resting, after having something to eat and drink. How do I count fetal movements? 1. Find a quiet, comfortable area. Sit, or lie down on your side. 2. Write down the   date, the start time and stop time, and the number of movements that you felt between those two times. Take this information with you to your health care visits. 3. For 2 hours, count kicks, flutters, swishes, rolls, and jabs. You should feel at least 10 movements during 2 hours. 4. You may stop counting after you have felt 10 movements. 5. If you do not feel 10 movements in 2 hours, have something to eat and drink. Then, keep resting and counting for 1 hour. If you feel  at least 4 movements during that hour, you may stop counting. Contact a health care provider if:  You feel fewer than 4 movements in 2 hours.  Your baby is not moving like he or she usually does. Date: ____________ Start time: ____________ Stop time: ____________ Movements: ____________ Date: ____________ Start time: ____________ Stop time: ____________ Movements: ____________ Date: ____________ Start time: ____________ Stop time: ____________ Movements: ____________ Date: ____________ Start time: ____________ Stop time: ____________ Movements: ____________ Date: ____________ Start time: ____________ Stop time: ____________ Movements: ____________ Date: ____________ Start time: ____________ Stop time: ____________ Movements: ____________ Date: ____________ Start time: ____________ Stop time: ____________ Movements: ____________ Date: ____________ Start time: ____________ Stop time: ____________ Movements: ____________ Date: ____________ Start time: ____________ Stop time: ____________ Movements: ____________ This information is not intended to replace advice given to you by your health care provider. Make sure you discuss any questions you have with your health care provider. Document Released: 01/19/2006 Document Revised: 08/19/2015 Document Reviewed: 01/29/2015 Elsevier Interactive Patient Education  2018 [employer]vier Inc.  

## 2017-12-12 ENCOUNTER — Telehealth: Payer: Self-pay | Admitting: Certified Nurse Midwife

## 2017-12-12 ENCOUNTER — Other Ambulatory Visit: Payer: Self-pay

## 2017-12-12 ENCOUNTER — Other Ambulatory Visit: Payer: Self-pay | Admitting: Obstetrics and Gynecology

## 2017-12-12 DIAGNOSIS — R7309 Other abnormal glucose: Secondary | ICD-10-CM

## 2017-12-12 MED ORDER — LABETALOL HCL 100 MG PO TABS: 200.0000 mg | ORAL_TABLET | Freq: Two times a day (BID) | ORAL | 3 refills | Status: DC

## 2017-12-12 NOTE — Telephone Encounter (Signed)
The patient called and stated that she would like to have her prescription sent to her pharmacy again due to her pharmacy stating they never received it. Thank you.

## 2017-12-12 NOTE — Discharge Summary (Signed)
L&D OB Triage Note  Sydney James is a 22 y.o. [id number] female at 764w4d, EDD Estimated Date of Delivery: 02/02/18 who presented to triage for BP check and second dose betamethasone, please see yesterdays note.[name] Kitchen.  She was evaluated by the nurses with no significant findings/findings significant for labor or worsening gestational hypertension. Vital signs stable. She was treated with second dose betamethasone.       Plan:  She was discharged home with bleeding/labor precautions/BP.  Continue routine prenatal care. Follow up with OB/GYN as previously scheduled.     Melody Suzan NailerN Shambley, CNM

## 2017-12-12 NOTE — Telephone Encounter (Signed)
Patient called stating the pharmacy made a mistake and will not fill her labetalol. The pharmacy is stating that she picked it up on 11/29/17 however the patient states she did not. The pharmacy told the patient she will need a new script sent to the pharmacy .Thanks

## 2017-12-13 ENCOUNTER — Ambulatory Visit (INDEPENDENT_AMBULATORY_CARE_PROVIDER_SITE_OTHER): Admitting: Obstetrics and Gynecology

## 2017-12-13 ENCOUNTER — Ambulatory Visit (INDEPENDENT_AMBULATORY_CARE_PROVIDER_SITE_OTHER)

## 2017-12-13 VITALS — BP 122/87 | HR 80 | Wt 224.7 lb

## 2017-12-13 DIAGNOSIS — Z3493 Encounter for supervision of normal pregnancy, unspecified, third trimester: Secondary | ICD-10-CM

## 2017-12-13 DIAGNOSIS — O321XX0 Maternal care for breech presentation, not applicable or unspecified: Secondary | ICD-10-CM | POA: Diagnosis not present

## 2017-12-13 DIAGNOSIS — Z3A32 32 weeks gestation of pregnancy: Secondary | ICD-10-CM | POA: Diagnosis not present

## 2017-12-13 DIAGNOSIS — R7309 Other abnormal glucose: Secondary | ICD-10-CM

## 2017-12-13 LAB — POCT URINALYSIS DIPSTICK OB
Bilirubin, UA: NEGATIVE
Blood, UA: NEGATIVE
Glucose, UA: NEGATIVE
Ketones, UA: NEGATIVE
Leukocytes, UA: NEGATIVE
NITRITE UA: NEGATIVE
PH UA: 7 (ref 5.0–8.0)
Spec Grav, UA: 1.01 (ref 1.010–1.025)
UROBILINOGEN UA: 0.2 U/dL

## 2017-12-13 NOTE — Progress Notes (Signed)
[name] and growth scan: Reviewed scan findings below: Indications:growth/afi and BPP Findings:  [place] Jim[place]leton intrauterine pregnancy is visualized with FHR at 140 BPM. Biometrics give an (U/S) Gestational age of 7770w5d and an (U/S) EDD of 02/02/18; this correlates with the clinically established Estimated Date of Delivery: 02/02/18.  Fetal presentation is Breech.  Placenta: posterior. Grade: 1 AFI: 10.3 cm  Growth percentile is 43. EFW: 4lb7oz/2013g (document both grams and pounds) 3. BPP Scoring: Movement: 2/2  Tone: 2/2  Breathing: 2/2  AFI: 2/2   Impression: 1. 7770w5d Viable Singleton Intrauterine pregnancy previously established criteria. 2. Growth is 43 %ile.  AFI is 10.2 cm.  3. BPP is 8/8

## 2017-12-13 NOTE — Progress Notes (Signed)
[name]- pt had US today, she is doing well, has had some nausea this past week

## 2017-12-19 ENCOUNTER — Other Ambulatory Visit: Payer: Self-pay

## 2017-12-19 ENCOUNTER — Inpatient Hospital Stay
Admission: EM | Admit: 2017-12-19 | Discharge: 2017-12-25 | DRG: 788 | Disposition: A | Discharge status: Home / Self Care | Attending: Certified Nurse Midwife | Admitting: Certified Nurse Midwife

## 2017-12-19 ENCOUNTER — Ambulatory Visit (INDEPENDENT_AMBULATORY_CARE_PROVIDER_SITE_OTHER): Admitting: Obstetrics and Gynecology

## 2017-12-19 ENCOUNTER — Other Ambulatory Visit

## 2017-12-19 VITALS — BP 160/90 | HR 126 | Wt 229.1 lb

## 2017-12-19 DIAGNOSIS — O133 Gestational [pregnancy-induced] hypertension without significant proteinuria, third trimester: Secondary | ICD-10-CM

## 2017-12-19 DIAGNOSIS — O26893 Other specified pregnancy related conditions, third trimester: Secondary | ICD-10-CM | POA: Diagnosis present

## 2017-12-19 DIAGNOSIS — Z8759 Personal history of other complications of pregnancy, childbirth and the puerperium: Secondary | ICD-10-CM

## 2017-12-19 DIAGNOSIS — Z3A33 33 weeks gestation of pregnancy: Secondary | ICD-10-CM

## 2017-12-19 DIAGNOSIS — O1414 Severe pre-eclampsia complicating childbirth: Secondary | ICD-10-CM | POA: Diagnosis present

## 2017-12-19 DIAGNOSIS — Z3493 Encounter for supervision of normal pregnancy, unspecified, third trimester: Secondary | ICD-10-CM

## 2017-12-19 DIAGNOSIS — Z8751 Personal history of pre-term labor: Secondary | ICD-10-CM

## 2017-12-19 DIAGNOSIS — O139 Gestational [pregnancy-induced] hypertension without significant proteinuria, unspecified trimester: Secondary | ICD-10-CM | POA: Diagnosis present

## 2017-12-19 DIAGNOSIS — R51 Headache: Secondary | ICD-10-CM | POA: Diagnosis present

## 2017-12-19 DIAGNOSIS — Z3A34 34 weeks gestation of pregnancy: Secondary | ICD-10-CM | POA: Diagnosis not present

## 2017-12-19 DIAGNOSIS — O134 Gestational [pregnancy-induced] hypertension without significant proteinuria, complicating childbirth: Secondary | ICD-10-CM | POA: Diagnosis present

## 2017-12-19 LAB — CBC
HCT: 34.2 % — ABNORMAL LOW (ref 36.0–46.0)
Hemoglobin: 11.1 g/dL — ABNORMAL LOW (ref 12.0–15.0)
MCH: 29.7 pg (ref 26.0–34.0)
MCHC: 32.5 g/dL (ref 30.0–36.0)
MCV: 91.4 fL (ref 80.0–100.0)
NRBC: 0 % (ref 0.0–0.2)
Platelets: 283 10*3/uL (ref 150–400)
RBC: 3.74 MIL/uL — AB (ref 3.87–5.11)
RDW: 12.8 % (ref 11.5–15.5)
WBC: 10.7 10*3/uL — ABNORMAL HIGH (ref 4.0–10.5)

## 2017-12-19 LAB — COMPREHENSIVE METABOLIC PANEL
ALT: 14 U/L (ref 0–44)
AST: 18 U/L (ref 15–41)
Albumin: 3 g/dL — ABNORMAL LOW (ref 3.5–5.0)
Alkaline Phosphatase: 91 U/L (ref 38–126)
Anion gap: 9 (ref 5–15)
BUN: 6 mg/dL (ref 6–20)
CO2: 20 mmol/L — ABNORMAL LOW (ref 22–32)
Calcium: 8.8 mg/dL — ABNORMAL LOW (ref 8.9–10.3)
Chloride: 107 mmol/L (ref 98–111)
Creatinine, Ser: 0.57 mg/dL (ref 0.44–1.00)
GFR calc Af Amer: >60 mL/min (ref >60)
GFR calc non Af Amer: >60 mL/min (ref >60)
Glucose, Bld: 91 mg/dL (ref 70–99)
Potassium: 3.8 mmol/L (ref 3.5–5.1)
Sodium: 136 mmol/L (ref 135–145)
Total Bilirubin: 0.4 mg/dL (ref 0.3–1.2)
Total Protein: 6.6 g/dL (ref 6.5–8.1)

## 2017-12-19 LAB — POCT URINALYSIS DIPSTICK OB
Bilirubin, UA: NEGATIVE
Glucose, UA: NEGATIVE
Ketones, UA: NEGATIVE
Nitrite, UA: NEGATIVE
RBC UA: NEGATIVE
Spec Grav, UA: 1.01 (ref 1.010–1.025)
Urobilinogen, UA: 0.2 E.U./dL
pH, UA: 6.5 (ref 5.0–8.0)

## 2017-12-19 LAB — PROTEIN / CREATININE RATIO, URINE
Creatinine, Urine: 81 mg/dL
Protein Creatinine Ratio: 0.15 mg/mg{Cre} (ref 0.00–0.15)
Total Protein, Urine: 12 mg/dL

## 2017-12-19 LAB — GROUP B STREP BY PCR: Group B strep by PCR: NEGATIVE

## 2017-12-19 LAB — SAMPLE TO BLOOD BANK

## 2017-12-19 LAB — TYPE AND SCREEN
ABO/RH(D): O POS
Antibody Screen: NEGATIVE

## 2017-12-19 LAB — FETAL FIBRONECTIN: FETAL FIBRONECTIN: NEGATIVE

## 2017-12-19 MED ORDER — BUTALBITAL-APAP-CAFFEINE 50-325-40 MG PO TABS: 1.0000 | ORAL_TABLET | ORAL | Status: DC | PRN

## 2017-12-19 MED ORDER — BETAMETHASONE SOD PHOS & ACET 6 (3-3) MG/ML IJ SUSP
12.0000 mg | INTRAMUSCULAR | Status: AC
  Administered 2017-12-19 – 2017-12-20 (×2): 12 mg via INTRAMUSCULAR
  Filled 2017-12-19: qty 2

## 2017-12-19 MED ORDER — LACTATED RINGERS IV SOLN
INTRAVENOUS | Status: DC
  Administered 2017-12-19 – 2017-12-20 (×4): via INTRAVENOUS

## 2017-12-19 MED ORDER — ACETAMINOPHEN 325 MG PO TABS
650.0000 mg | ORAL_TABLET | ORAL | Status: DC | PRN
  Administered 2017-12-19 – 2017-12-25 (×10): 650 mg via ORAL
  Filled 2017-12-19 (×11): qty 2

## 2017-12-19 MED ORDER — HYDRALAZINE HCL 20 MG/ML IJ SOLN: 10.0000 mg | INTRAMUSCULAR | Status: DC | PRN

## 2017-12-19 MED ORDER — ONDANSETRON HCL 4 MG/2ML IJ SOLN: 4.0000 mg | Freq: Four times a day (QID) | INTRAMUSCULAR | Status: DC | PRN

## 2017-12-19 MED ORDER — LIDOCAINE HCL (PF) 1 % IJ SOLN: 30.0000 mL | INTRAMUSCULAR | Status: DC | PRN

## 2017-12-19 MED ORDER — CALCIUM GLUCONATE 10 % IV SOLN
INTRAVENOUS | Status: AC
  Filled 2017-12-19: qty 10

## 2017-12-19 MED ORDER — LABETALOL HCL 5 MG/ML IV SOLN
20.0000 mg | INTRAVENOUS | Status: DC | PRN
  Administered 2017-12-20 – 2017-12-22 (×3): 20 mg via INTRAVENOUS
  Filled 2017-12-19 (×9): qty 4

## 2017-12-19 MED ORDER — CODEINE SULFATE 30 MG PO TABS
30.0000 mg | ORAL_TABLET | ORAL | Status: DC | PRN
  Filled 2017-12-19: qty 1

## 2017-12-19 MED ORDER — LABETALOL HCL 5 MG/ML IV SOLN
80.0000 mg | INTRAVENOUS | Status: DC | PRN
  Filled 2017-12-19: qty 16

## 2017-12-19 MED ORDER — MAGNESIUM SULFATE 40 G IN LACTATED RINGERS - SIMPLE
INTRAVENOUS | Status: AC
  Filled 2017-12-19: qty 500

## 2017-12-19 MED ORDER — MAGNESIUM SULFATE BOLUS VIA INFUSION
4.0000 g | Freq: Once | INTRAVENOUS | Status: AC
  Administered 2017-12-19: 4 g via INTRAVENOUS
  Filled 2017-12-19: qty 500

## 2017-12-19 MED ORDER — LACTATED RINGERS IV SOLN: 500.0000 mL | INTRAVENOUS | Status: DC | PRN

## 2017-12-19 MED ORDER — LABETALOL HCL 100 MG PO TABS
300.0000 mg | ORAL_TABLET | Freq: Two times a day (BID) | ORAL | Status: DC
  Administered 2017-12-19 – 2017-12-21 (×4): 300 mg via ORAL
  Filled 2017-12-19 (×4): qty 3

## 2017-12-19 MED ORDER — LABETALOL HCL 100 MG PO TABS
100.0000 mg | ORAL_TABLET | ORAL | Status: AC
  Administered 2017-12-19: 100 mg via ORAL
  Filled 2017-12-19: qty 1

## 2017-12-19 MED ORDER — OXYTOCIN BOLUS FROM INFUSION: 500.0000 mL | Freq: Once | INTRAVENOUS | Status: DC

## 2017-12-19 MED ORDER — ASPIRIN EC 81 MG PO TBEC
81.0000 mg | DELAYED_RELEASE_TABLET | Freq: Every day | ORAL | Status: DC
  Administered 2017-12-20: 81 mg via ORAL
  Filled 2017-12-19 (×3): qty 1

## 2017-12-19 MED ORDER — OXYCODONE-ACETAMINOPHEN 5-325 MG PO TABS
1.0000 | ORAL_TABLET | ORAL | Status: DC | PRN
  Administered 2017-12-19 – 2017-12-23 (×3): 1 via ORAL
  Filled 2017-12-19 (×4): qty 1

## 2017-12-19 MED ORDER — OXYTOCIN 40 UNITS IN LACTATED RINGERS INFUSION - SIMPLE MED: 2.5000 [IU]/h | INTRAVENOUS | Status: DC

## 2017-12-19 MED ORDER — MAGNESIUM SULFATE 40 G IN LACTATED RINGERS - SIMPLE
2.0000 g/h | INTRAVENOUS | Status: DC
  Administered 2017-12-20: 2 g/h via INTRAVENOUS
  Filled 2017-12-19: qty 500

## 2017-12-19 MED ORDER — BUTALBITAL-APAP-CAFF-COD 50-325-40-30 MG PO CAPS: 1.0000 | ORAL_CAPSULE | ORAL | Status: DC | PRN

## 2017-12-19 MED ORDER — LABETALOL HCL 5 MG/ML IV SOLN
40.0000 mg | INTRAVENOUS | Status: DC | PRN
  Administered 2017-12-22: 40 mg via INTRAVENOUS
  Filled 2017-12-19 (×2): qty 8

## 2017-12-19 MED ORDER — OXYCODONE-ACETAMINOPHEN 5-325 MG PO TABS
2.0000 | ORAL_TABLET | ORAL | Status: DC | PRN
  Administered 2017-12-20: 1 via ORAL

## 2017-12-19 MED ORDER — BUTORPHANOL TARTRATE 1 MG/ML IJ SOLN: 1.0000 mg | INTRAMUSCULAR | Status: DC | PRN

## 2017-12-19 MED ORDER — SOD CITRATE-CITRIC ACID 500-334 MG/5ML PO SOLN: 30.0000 mL | ORAL | Status: DC | PRN

## 2017-12-19 MED ORDER — BETAMETHASONE SOD PHOS & ACET 6 (3-3) MG/ML IJ SUSP
INTRAMUSCULAR | Status: AC
  Filled 2017-12-19: qty 5

## 2017-12-19 NOTE — Consult Note (Signed)
Neonatology Consultation:  Asked by M. Lawhorn, CNM for [facility] to provide prenatal consultation for patient at risk for preterm delivery due to gestational HTN and preterm labor.  Mother is 22 y.o. G5 P0-1-3-1 who is now 33.[redacted] weeks EGA.  She is being treated with betamethasone (2nd course - previously had BMZ 11/22 for preterm labor) and tocolysis with MgSO4, along with anti-hypertensives.  Discussed patient and FOB usual expectations for preterm infant at 5733 - [redacted] weeks gestation, including possible needs for DR resuscitation, respiratory support, feeding problems, IV access, and temp support. Projected possible length of stay in NICU until 37 - [redacted] wks EGA.  Discussed advantages of feeding with mother's milk.  She plans to pump postnatally.  Patient and FOB were attentive, had appropriate questions.  Their first child was born at 6536 wks and was patient in SCN about 10 days due to low blood sugar (per parents).  Thank you for consulting Neonatology.  Total time 20 minutes - 10 minutes face-to-face  JWimmer, MD

## 2017-12-19 NOTE — Progress Notes (Signed)
OB WORK IN- pt is having headaches, contractions, has felt "terrible" all weekend

## 2017-12-19 NOTE — H&P (Addendum)
Sydney James is a 22 y.o. [id number] at 6854w4d who is admitted for evaluation of gestational hypertension-rule out pre-eclampsia and preterm labor. Estimated Date of Delivery: 02/02/18.  Fetal presentation is cephalic, verified by bedside ultrasound.  Length of Stay:  0 Days. Admitted 12/19/2017  Subjective:  Patient presents from office for evaluation of worsening gestational hypertension, possible pre-eclampsia. Endorses increased blood pressures on home monitor and change in headache symptoms. Patient has daily headaches, but intensity has "greatly" increased.   Patient reports good fetal movement.  She reports irregular uterine contractions, no bleeding and no loss of fluid per vagina.  Denies difficulty breathing or respiratory distress, chest pain, abdominal pain, dysuria, and leg pain or swelling.   History significant for gestational hypertension, Abnormal glucola, History of pre-eclampsia, and History of preterm birth.   Objective:  Temp:  [98.1 F (36.7 C)] 98.1 F (36.7 C) (12/17 1048) Pulse Rate:  [68-126] 77 (12/17 1326) Resp:  [80] 80 (12/17 1048) BP: (143-163)/(90-105) 163/95 (12/17 1326) Weight:  [103.9 kg] 103.9 kg (12/17 1048)  Physical Examination:  CONSTITUTIONAL: Well-developed, well-nourished female in no acute distress.   SKIN: Skin is warm and dry. No rash noted. Not diaphoretic. No erythema. No pallor.  NEUROLGIC: Alert and oriented to person, place, and time. Normal reflexes, muscle tone coordination. No cranial nerve deficit noted.  PSYCHIATRIC: Normal mood and affect. Normal behavior. Normal judgment and thought content.  CARDIOVASCULAR: Normal heart rate noted, regular rhythm  RESPIRATORY: Effort and breath sounds normal, no problems with respiration noted  MUSCULOSKELETAL: Normal range of motion. No edema and no tenderness. 2+ distal pulses.  ABDOMEN: Soft, nontender, nondistended, gravid.  CERVIX: Dilation: 1 Effacement (%): 50 Station:  -3 Exam by:: Mahaley Schwering CNM  Fetal monitoring: FHR: 125 bpm, Variability: moderate, Accelerations: Present, Decelerations: Absent   Uterine activity: Contractions every four (4) to five (5) minutes, soft resting tone  Results for orders placed or performed during the hospital encounter of 12/19/17 (from the past 48 hour(s))  Comprehensive metabolic panel     Status: Abnormal   Collection Time: 12/19/17 10:47 AM  Result Value Ref Range   Sodium 136 135 - 145 mmol/L   Potassium 3.8 3.5 - 5.1 mmol/L   Chloride 107 98 - 111 mmol/L   CO2 20 (L) 22 - 32 mmol/L   Glucose, Bld 91 70 - 99 mg/dL   BUN 6 6 - 20 mg/dL   Creatinine, Ser 4.540.57 0.44 - 1.00 mg/dL   Calcium 8.8 (L) 8.9 - 10.3 mg/dL   Total Protein 6.6 6.5 - 8.1 g/dL   Albumin 3.0 (L) 3.5 - 5.0 g/dL   AST 18 15 - 41 U/L   ALT 14 0 - 44 U/L   Alkaline Phosphatase 91 38 - 126 U/L   Total Bilirubin 0.4 0.3 - 1.2 mg/dL   GFR calc non Af Amer >60 >60 mL/min   GFR calc Af Amer >60 >60 mL/min   Anion gap 9 5 - 15    Comment: Performed at [facility], [address] [place], [place] [address]  CBC     Status: Abnormal   Collection Time: 12/19/17 10:47 AM  Result Value Ref Range   WBC 10.7 (H) 4.0 - 10.5 K/uL   RBC 3.74 (L) 3.87 - 5.11 MIL/uL   Hemoglobin 11.1 (L) 12.0 - 15.0 g/dL   HCT 91.434.2 (L) 78.236.0 - 95.646.0 %   MCV 91.4 80.0 - 100.0 fL   MCH 29.7 26.0 - 34.0 pg   MCHC  32.5 30.0 - 36.0 g/dL   RDW 16.1 09.6 - 04.5 %   Platelets 283 150 - 400 K/uL   nRBC 0.0 0.0 - 0.2 %    Comment: Performed at [facility], [address] [place], [place] [address]  Sample to Blood Bank     Status: None   Collection Time: 12/19/17 10:50 AM  Result Value Ref Range   Blood Bank Specimen SAMPLE AVAILABLE FOR TESTING    Sample Expiration      12/22/2017 Performed at [facility], [address] [place], [place] [address]   Protein / creatinine ratio, urine     Status: None   Collection Time: 12/19/17 10:51 AM  Result  Value Ref Range   Creatinine, Urine 81 mg/dL   Total Protein, Urine 12 mg/dL    Comment: NO NORMAL RANGE ESTABLISHED FOR THIS TEST   Protein Creatinine Ratio 0.15 0.00 - 0.15 mg/mg[Cre]    Comment: Performed at [facility] [place] [facility], [address] [place], [place] [address]  Fetal fibronectin     Status: None   Collection Time: 12/19/17  1:09 PM  Result Value Ref Range   Fetal Fibronectin NEGATIVE NEGATIVE   Appearance, FETFIB CLEAR CLEAR    Comment: Performed at [facility], [address] [place], [place] [address]    No results found.  Current scheduled medications . aspirin EC  81 mg Oral Daily  . betamethasone acetate-betamethasone sodium phosphate  12 mg Intramuscular Q24 Hr x 2  . labetalol  100 mg Oral NOW  . labetalol  300 mg Oral BID  . magnesium  4 g Intravenous Once  . oxytocin 40 units in LR 1000 mL  500 mL Intravenous Once    I have reviewed the patient's current medications.  ASSESSMENT: Patient Active Problem List   Diagnosis Date Noted  . Preterm labor 12/19/2017  . Gestational hypertension 11/28/2017  . Abnormal glucose level 11/16/2017  . History of pre-eclampsia 08/17/2017  . History of preterm delivery 08/17/2017  . Vitamin D deficiency 11/02/2016    PLAN:  Admit to birthing suites for 24 hour urine and IV magnesium sulfate, see orders.   Neonatology consult, see orders. Telephone call to Dr. Christella Scheuermann, advised repeat Betamethasone dosing.   Increase Labetalol to 300 mg PO BID, see orders.   Plan of care discussed with patient and spouse, verbalized understanding.   Reviewed red flag symptoms and when to call.   Continue orders as written. Reassess as needed.   Dr. Logan Bores consulted and agrees with plan of care.    Gunnar Bulla, CNM [facility], [facility] 12/19/17 2:00 PM

## 2017-12-19 NOTE — Progress Notes (Signed)
Work in [employer]- serial BPs in offcie reveal: 160/90 148/92 149/91 142/69 145/86 142/86 130/88 135/84 Sent to :L&D for more testing and labs, [facility] aware as provider on call.

## 2017-12-19 NOTE — Progress Notes (Signed)
Patient ID: Sydney PrinceMichaela Narvaez, female   DOB: 1996/01/01, 22 y.o.   MRN: [id number]  Subjective:  Sitting in bed, playing Dorisann FramesYahtzee with spouse. Patient reports good fetal movement.  She reports occasional uterine contractions, no bleeding and no loss of fluid per vagina. Per patient headache is resolving.   Denies blurred vision, difficulty breathing or respiratory distress, chest pain, abdominal or epigastric pain, dysuria, and leg pain or swelling.   Objective:   Temp:  [97.9 F (36.6 C)-98.1 F (36.7 C)] 97.9 F (36.6 C) (12/17 1611) Pulse Rate:  [68-126] 80 (12/17 1702) Resp:  [18] 18 (12/17 1611) BP: (121-163)/(63-105) 143/86 (12/17 1702) Weight:  [103.9 kg] 103.9 kg (12/17 1048)  Physical Examination:  CONSTITUTIONAL: Well-developed, well-nourished female in no acute distress.   NEUROLGIC: Alert and oriented to person, place, and time. Normal reflexes, muscle tone coordination. No cranial nerve deficit noted.  PSYCHIATRIC: Normal mood and affect. Normal behavior. Normal judgment and thought content.  MUSCULOSKELETAL: Normal range of motion. No edema and no tenderness. 2+ distal pulses.  ABDOMEN: Soft, nontender, nondistended, gravid.  CERVIX: Deferred.   Fetal monitoring: FHR: 130 bpm, Variability: moderate, Accelerations: Present, Decelerations: Absent   Uterine activity: Rare, soft resting tone  Current scheduled medications . aspirin EC  81 mg Oral Daily  . betamethasone acetate-betamethasone sodium phosphate  12 mg Intramuscular Q24 Hr x 2  . labetalol  300 mg Oral BID  . oxytocin 40 units in LR 1000 mL  500 mL Intravenous Once    I have reviewed the patient's current medications.  ASSESSMENT: Patient Active Problem List   Diagnosis Date Noted  . Preterm labor 12/19/2017  . Gestational hypertension 11/28/2017  . Abnormal glucose level 11/16/2017  . History of pre-eclampsia 08/17/2017  . History of preterm delivery 08/17/2017  . Vitamin D deficiency  11/02/2016    PLAN:  May have regular diet, see orders.   Will collect GBS swab, see orders.   Reviewed red flag symptoms and when to call.   Continue orders as written. Reassess as needed.    Gunnar BullaJenkins Michelle Abagail Limb, CNM [facility], [facility] 12/19/17 6:00 PM

## 2017-12-19 NOTE — OB Triage Note (Signed)
Pt G5P2 341w4d [facility]. Pt states she has h/a, but denies blurry vision, RUQ pain, ctx, LOF, and states she has + FM. Monitors applied and assessing. VSS.

## 2017-12-20 LAB — COMPREHENSIVE METABOLIC PANEL
ALT: 12 U/L (ref 0–44)
ANION GAP: 6 (ref 5–15)
AST: 16 U/L (ref 15–41)
Albumin: 2.7 g/dL — ABNORMAL LOW (ref 3.5–5.0)
Alkaline Phosphatase: 89 U/L (ref 38–126)
BUN: 8 mg/dL (ref 6–20)
CO2: 21 mmol/L — ABNORMAL LOW (ref 22–32)
Calcium: 8.2 mg/dL — ABNORMAL LOW (ref 8.9–10.3)
Chloride: 108 mmol/L (ref 98–111)
Creatinine, Ser: 0.54 mg/dL (ref 0.44–1.00)
GFR calc Af Amer: >60 mL/min (ref >60)
GFR calc non Af Amer: >60 mL/min (ref >60)
Glucose, Bld: 114 mg/dL — ABNORMAL HIGH (ref 70–99)
Potassium: 4 mmol/L (ref 3.5–5.1)
Sodium: 135 mmol/L (ref 135–145)
TOTAL PROTEIN: 6.2 g/dL — AB (ref 6.5–8.1)
Total Bilirubin: 0.3 mg/dL (ref 0.3–1.2)

## 2017-12-20 LAB — CREATININE, URINE, 24 HOUR
Collection Interval-UCRE24: 24 hours
Creatinine, 24H Ur: 1785 mg/d (ref 600–1800)
Creatinine, Urine: 35 mg/dL
Urine Total Volume-UCRE24: 5100 mL

## 2017-12-20 LAB — PROTEIN, URINE, 24 HOUR
Collection Interval-UPROT: 24 hours
Protein, Urine: <6 mg/dL
Urine Total Volume-UPROT: 5100 mL

## 2017-12-20 LAB — CBC
HCT: 31.6 % — ABNORMAL LOW (ref 36.0–46.0)
Hemoglobin: 10.2 g/dL — ABNORMAL LOW (ref 12.0–15.0)
MCH: 29.5 pg (ref 26.0–34.0)
MCHC: 32.3 g/dL (ref 30.0–36.0)
MCV: 91.3 fL (ref 80.0–100.0)
Platelets: 259 10*3/uL (ref 150–400)
RBC: 3.46 MIL/uL — ABNORMAL LOW (ref 3.87–5.11)
RDW: 12.9 % (ref 11.5–15.5)
WBC: 13.6 10*3/uL — AB (ref 4.0–10.5)
nRBC: 0 % (ref 0.0–0.2)

## 2017-12-20 LAB — RPR: RPR Ser Ql: NONREACTIVE

## 2017-12-20 MED ORDER — MAGNESIUM SULFATE 40 G IN LACTATED RINGERS - SIMPLE
1.0000 g/h | INTRAVENOUS | Status: DC
  Administered 2017-12-21: 1 g/h via INTRAVENOUS

## 2017-12-20 MED ORDER — METOCLOPRAMIDE HCL 5 MG/ML IJ SOLN
10.0000 mg | Freq: Four times a day (QID) | INTRAMUSCULAR | Status: DC | PRN
  Administered 2017-12-20: 10 mg via INTRAVENOUS
  Filled 2017-12-20 (×3): qty 2

## 2017-12-20 NOTE — Progress Notes (Signed)
Patient ID: Sydney PrinceMichaela Guidry, female   DOB: 04-Jan-1995, 22 y.o.   MRN: [id number]  Subjective:  Sitting in bed, spouse at bedside. Patient reports good fetal movement.  She reports occasional uterine contractions, no bleeding and no loss of fluid per vagina. Per patient headache is starting to increase in intensity.   Denies blurred vision, difficulty breathing or respiratory distress, chest pain, abdominal or epigastric pain, dysuria, and leg pain or swelling.   Objective:   Temp:  [97.9 F (36.6 C)-98.1 F (36.7 C)] 97.9 F (36.6 C) (12/17 1611) Pulse Rate:  [68-126] 80 (12/18 0651) Resp:  [18] 18 (12/17 1611) BP: (121-163)/(63-105) 146/85 (12/18 0651) Weight:  [103.9 kg] 103.9 kg (12/17 1048)  Physical Examination:  CONSTITUTIONAL: Well-developed, well-nourished female in no acute distress.   NEUROLGIC: Alert and oriented to person, place, and time. Normal reflexes, muscle tone coordination. No cranial nerve deficit noted.  PSYCHIATRIC: Normal mood and affect. Normal behavior. Normal judgment and thought content.  MUSCULOSKELETAL: Normal range of motion. No edema and no tenderness. 2+ distal pulses.  ABDOMEN: Soft, nontender, nondistended, gravid.  CERVIX: Deferred.   Fetal monitoring: FHR: 120 bpm, Variability: moderate, Accelerations: Present, Decelerations: Absent   Uterine activity: None, soft resting tone  Current scheduled medications . aspirin EC  81 mg Oral Daily  . betamethasone acetate-betamethasone sodium phosphate  12 mg Intramuscular Q24 Hr x 2  . labetalol  300 mg Oral BID  . oxytocin 40 units in LR 1000 mL  500 mL Intravenous Once    I have reviewed the patient's current medications.  ASSESSMENT: Patient Active Problem List   Diagnosis Date Noted  . Preterm labor 12/19/2017  . Gestational hypertension 11/28/2017  . Abnormal glucose level 11/16/2017  . History of pre-eclampsia 08/17/2017  . History of preterm delivery 08/17/2017  . Vitamin D  deficiency 11/02/2016    PLAN:  Will add IV Reglan for relief of headache/migraine symptoms, see orders.   Anticipatory guidance regarding plan of care. Will repeat CBC and CMP this AM.   Reviewed red flag symptoms and when to call.   Continue orders as written. Reassess as needed.    Gunnar BullaJenkins Michelle Jean Alejos, CNM [facility], [facility] 12/20/17 7:40 AM

## 2017-12-20 NOTE — Progress Notes (Signed)
Sydney James is a 22 y.o. [id number] at 2285w5d by LMP admitted for observation and management of elevated blood pressures.   Subjective: Denies pain or headache at this time, sitting in bed talking with spouse.  Objective: BP (!) 151/94   Pulse 70   Temp 98.1 F (36.7 C) (Oral)   Resp 16   Ht 5\' 7"  (1.702 m)   Wt 103.9 kg   LMP 04/28/2017   BMI 35.87 kg/m  I/O last 3 completed shifts: In: 2221.8 [I.V.:2221.8] Out: 2800 [Urine:2800] Total I/O In: 2648.8 [P.O.:1600; I.V.:1048.8] Out: 2750 [Urine:2750]  Fetal Wellbeing:  Category I UC:   none SVE:   Dilation: 1 Effacement (%): 50 Station: -3 Exam by:: Sydney James CNM  Labs: Lab Results  Component Value Date   WBC 13.6 (H) 12/20/2017   HGB 10.2 (L) 12/20/2017   HCT 31.6 (L) 12/20/2017   MCV 91.3 12/20/2017   PLT 259 12/20/2017    Assessment / Plan: gestational hypertention and evaluation for pre-eclampsia, waiting to complete 24 hour urine to make managment decision. will add procardia trial  Labor: not in labor Preeclampsia:  on magnesium sulfate Pain Control:  NA Anticipated MOD:  NSVD  Sydney James N Sydney James 12/20/2017, 5:50 PM

## 2017-12-20 NOTE — Progress Notes (Signed)
Sydney James is a 22 y.o. [id number] at 10285w5d by LMP admitted for induction of labor due to Hypertension. Assuming care this am.  Subjective: No changes  Objective: BP (!) 147/95   Pulse 80   Temp 98.4 F (36.9 C) (Oral)   Resp 18   Ht 5\' 7"  (1.702 m)   Wt 103.9 kg   LMP 04/28/2017   BMI 35.87 kg/m  I/O last 3 completed shifts: In: 711.6 [I.V.:711.6] Out: 2800 [Urine:2800] Total I/O In: 300 [P.O.:300] Out: 500 [Urine:500]  Fetal Wellbeing:  Category I UC:   none SVE:   Dilation: 1 Effacement (%): 50 Station: -3 Exam by:: Lawhorn CNM  Labs: Lab Results  Component Value Date   WBC 13.6 (H) 12/20/2017   HGB 10.2 (L) 12/20/2017   HCT 31.6 (L) 12/20/2017   MCV 91.3 12/20/2017   PLT 259 12/20/2017    Assessment / Plan: pre-eclampsia management with pending IOL after second dose betamethasone given  Labor: not in labor yet Preeclampsia:  on magnesium sulfate and labs stable Pain Control:  NA Anticipated MOD:  NSVD  Amour Cutrone N Genetta Fiero 12/20/2017, 10:13 AM

## 2017-12-21 ENCOUNTER — Other Ambulatory Visit

## 2017-12-21 ENCOUNTER — Encounter: Admitting: Obstetrics and Gynecology

## 2017-12-21 MED ORDER — LABETALOL HCL 100 MG PO TABS
300.0000 mg | ORAL_TABLET | Freq: Three times a day (TID) | ORAL | Status: DC
  Administered 2017-12-21 (×2): 300 mg via ORAL
  Filled 2017-12-21: qty 3
  Filled 2017-12-21: qty 1

## 2017-12-21 NOTE — Progress Notes (Signed)
Joella PrinceMichaela Worrall is a 22 y.o. [id number] at 6248w6d who is admitted for management of gestational hypertension.  Estimated Date of Delivery: 02/02/18 Fetal presentation is cephalic.  Length of Stay:  2 Days. Admitted 12/19/2017  Subjective: Feeling frustrated and weepy this am, but denies headache or other concerns Patient reports good fetal movement.  She reports no uterine contractions, no bleeding and no loss of fluid per vagina.  Vitals:  Blood pressure (!) 161/98, pulse 82, temperature 98.1 F (36.7 C), temperature source Oral, resp. rate 16, height 5\' 7"  (1.702 m), weight 103.9 kg, last menstrual period 04/28/2017. Physical Examination: CONSTITUTIONAL: Well-developed, well-nourished female in no acute distress.  SKIN: Skin is warm and dry. No rash noted. Not diaphoretic. No erythema. No pallor. NEUROLGIC: Alert and oriented to person, place, and time. Normal reflexes, muscle tone coordination. No cranial nerve deficit noted. PSYCHIATRIC: Normal mood and affect. Normal behavior. Normal judgment and thought content. CARDIOVASCULAR: Normal heart rate noted, regular rhythm RESPIRATORY: Effort and breath sounds normal, no problems with respiration noted MUSCULOSKELETAL: Normal range of motion. mild edema and no tenderness. 2+ distal pulses.negative clonus ABDOMEN: Soft, nontender, nondistended, gravid.  Fetal monitoring: FHR: 131 bpm, Variability: moderate, Accelerations: Present, Decelerations: Absent  Uterine activity: no contractions per hour  Results for orders placed or performed during the hospital encounter of 12/19/17 (from the past 48 hour(s))  Comprehensive metabolic panel     Status: Abnormal   Collection Time: 12/19/17 10:47 AM  Result Value Ref Range   Sodium 136 135 - 145 mmol/L   Potassium 3.8 3.5 - 5.1 mmol/L   Chloride 107 98 - 111 mmol/L   CO2 20 (L) 22 - 32 mmol/L   Glucose, Bld 91 70 - 99 mg/dL   BUN 6 6 - 20 mg/dL   Creatinine, Ser 9.810.57 0.44 - 1.00 mg/dL   Calcium  8.8 (L) 8.9 - 10.3 mg/dL   Total Protein 6.6 6.5 - 8.1 g/dL   Albumin 3.0 (L) 3.5 - 5.0 g/dL   AST 18 15 - 41 U/L   ALT 14 0 - 44 U/L   Alkaline Phosphatase 91 38 - 126 U/L   Total Bilirubin 0.4 0.3 - 1.2 mg/dL   GFR calc non Af Amer >60 >60 mL/min   GFR calc Af Amer >60 >60 mL/min   Anion gap 9 5 - 15    Comment: Performed at [facility], [address] [place], [place] [address]  CBC     Status: Abnormal   Collection Time: 12/19/17 10:47 AM  Result Value Ref Range   WBC 10.7 (H) 4.0 - 10.5 K/uL   RBC 3.74 (L) 3.87 - 5.11 MIL/uL   Hemoglobin 11.1 (L) 12.0 - 15.0 g/dL   HCT 82.934.2 (L) 56.236.0 - 13.046.0 %   MCV 91.4 80.0 - 100.0 fL   MCH 29.7 26.0 - 34.0 pg   MCHC 32.5 30.0 - 36.0 g/dL   RDW 86.512.8 78.411.5 - 69.615.5 %   Platelets 283 150 - 400 K/uL   nRBC 0.0 0.0 - 0.2 %    Comment: Performed at [facility], [address] [place], [place] [address]  Sample to Blood Bank     Status: None   Collection Time: 12/19/17 10:50 AM  Result Value Ref Range   Blood Bank Specimen SAMPLE AVAILABLE FOR TESTING    Sample Expiration      12/22/2017 Performed at [facility], [address] [place], [place] [address]   Type and screen [facility]     Status: None   Collection Time: 12/19/17 10:50 AM  Result Value Ref Range   ABO/RH(D) O POS    Antibody Screen NEG    Sample Expiration      12/22/2017 Performed at [facility], [address] [place], [place] [address]   RPR     Status: None   Collection Time: 12/19/17 10:50 AM  Result Value Ref Range   RPR Ser Ql Non Reactive Non Reactive    Comment: (NOTE) Performed At: [facility] [address] [place], [place] [id number] Jolene SchimkeNagendra Sanjai MD UV:[phone]   Protein / creatinine ratio, urine     Status: None   Collection Time: 12/19/17 10:51 AM  Result Value Ref Range   Creatinine, Urine 81 mg/dL   Total Protein, Urine 12 mg/dL    Comment: NO NORMAL RANGE ESTABLISHED  FOR THIS TEST   Protein Creatinine Ratio 0.15 0.00 - 0.15 mg/mg[Cre]    Comment: Performed at [facility], [address] [place], [place] [address]  Fetal fibronectin     Status: None   Collection Time: 12/19/17  1:09 PM  Result Value Ref Range   Fetal Fibronectin NEGATIVE NEGATIVE   Appearance, FETFIB CLEAR CLEAR    Comment: Performed at [facility], [address] [place], [place] [address]  Protein, urine, 24 hour     Status: None   Collection Time: 12/19/17  6:14 PM  Result Value Ref Range   Urine Total Volume-UPROT 5,100 mL   Collection Interval-UPROT 24 hours   Protein, Urine <6 mg/dL   Protein, 56E24H Urine        50 - 100 mg/day    Comment: RESULT BELOW REPORTABLE RANGE, UNABLE TO CALCULATE. Performed at [facility], [address] [place], [place] [address]   Creatinine, urine, 24 hour     Status: None   Collection Time: 12/19/17  6:14 PM  Result Value Ref Range   Urine Total Volume-UCRE24 5,100 mL   Collection Interval-UCRE24 24 hours   Creatinine, Urine 35 mg/dL   Creatinine, 18A24H Ur 4,1661,785 600 - 1,800 mg/day    Comment: Performed at [facility], [address] [place], [place] [address]  Group B strep by PCR     Status: None   Collection Time: 12/19/17  6:43 PM  Result Value Ref Range   Group B strep by PCR NEGATIVE NEGATIVE    Comment: (NOTE) Intrapartum testing with Xpert GBS assay should be used as an adjunct to other methods available and not used to replace antepartum testing (at 35-[redacted] weeks gestation). Performed at [facility], [address] [place], [place] [address]   CBC     Status: Abnormal   Collection Time: 12/20/17  8:57 AM  Result Value Ref Range   WBC 13.6 (H) 4.0 - 10.5 K/uL   RBC 3.46 (L) 3.87 - 5.11 MIL/uL   Hemoglobin 10.2 (L) 12.0 - 15.0 g/dL   HCT 32.331.6 (L) 55.736.0 - 32.246.0 %   MCV 91.3 80.0 - 100.0 fL   MCH 29.5 26.0 - 34.0 pg   MCHC 32.3 30.0 - 36.0 g/dL   RDW 02.512.9 42.711.5 - 06.215.5 %    Platelets 259 150 - 400 K/uL   nRBC 0.0 0.0 - 0.2 %    Comment: Performed at [facility], [address] [place], [place] [address]  Comprehensive metabolic panel     Status: Abnormal   Collection Time: 12/20/17  8:57 AM  Result Value Ref Range   Sodium 135 135 - 145 mmol/L   Potassium 4.0 3.5 - 5.1 mmol/L   Chloride 108 98 - 111 mmol/L   CO2 21 (L) 22 - 32 mmol/L   Glucose, Bld 114 (H) 70 - 99 mg/dL   BUN 8 6 - 20 mg/dL   Creatinine, Ser 1.61 0.44 - 1.00 mg/dL   Calcium 8.2 (L) 8.9 - 10.3 mg/dL   Total Protein 6.2 (L) 6.5 - 8.1 g/dL   Albumin 2.7 (L) 3.5 - 5.0 g/dL   AST 16 15 - 41 U/L   ALT 12 0 - 44 U/L   Alkaline Phosphatase 89 38 - 126 U/L   Total Bilirubin 0.3 0.3 - 1.2 mg/dL   GFR calc non Af Amer >60 >60 mL/min   GFR calc Af Amer >60 >60 mL/min   Anion gap 6 5 - 15    Comment: Performed at [facility], [address] [place], [place] [address]    No results found.  Current scheduled medications . aspirin EC  81 mg Oral Daily  . labetalol  300 mg Oral TID    I have reviewed the patient's current medications.  ASSESSMENT: Patient Active Problem List   Diagnosis Date Noted  . Preterm labor 12/19/2017  . Gestational hypertension 11/28/2017  . Abnormal glucose level 11/16/2017  . History of pre-eclampsia 08/17/2017  . History of preterm delivery 08/17/2017  . Vitamin D deficiency 11/02/2016    PLAN: Will increase labetalol to 300mg  tid, discontinue mag sulfate and continue monitoring BP hourly. If stable will d/c home this pm with bedtime. Continue routine antenatal and gestational hypertension management.  MELODY Suzan Nailer, CNM [facility]

## 2017-12-22 ENCOUNTER — Inpatient Hospital Stay: Admitting: Anesthesiology

## 2017-12-22 ENCOUNTER — Encounter
Admission: EM | Disposition: A | Discharge status: Home / Self Care | Payer: Self-pay | Source: Home / Self Care | Attending: Certified Nurse Midwife

## 2017-12-22 DIAGNOSIS — O134 Gestational [pregnancy-induced] hypertension without significant proteinuria, complicating childbirth: Secondary | ICD-10-CM

## 2017-12-22 DIAGNOSIS — Z3A34 34 weeks gestation of pregnancy: Secondary | ICD-10-CM

## 2017-12-22 LAB — CBC
HCT: 31.2 % — ABNORMAL LOW (ref 36.0–46.0)
Hemoglobin: 10.2 g/dL — ABNORMAL LOW (ref 12.0–15.0)
MCH: 29.5 pg (ref 26.0–34.0)
MCHC: 32.7 g/dL (ref 30.0–36.0)
MCV: 90.2 fL (ref 80.0–100.0)
Platelets: 246 10*3/uL (ref 150–400)
RBC: 3.46 MIL/uL — AB (ref 3.87–5.11)
RDW: 13.2 % (ref 11.5–15.5)
WBC: 12.8 10*3/uL — ABNORMAL HIGH (ref 4.0–10.5)
nRBC: 0 % (ref 0.0–0.2)

## 2017-12-22 LAB — COMPREHENSIVE METABOLIC PANEL
ALT: 46 U/L — ABNORMAL HIGH (ref 0–44)
AST: 81 U/L — ABNORMAL HIGH (ref 15–41)
Albumin: 2.6 g/dL — ABNORMAL LOW (ref 3.5–5.0)
Alkaline Phosphatase: 89 U/L (ref 38–126)
Anion gap: 9 (ref 5–15)
BUN: 16 mg/dL (ref 6–20)
CO2: 18 mmol/L — ABNORMAL LOW (ref 22–32)
Calcium: 8.8 mg/dL — ABNORMAL LOW (ref 8.9–10.3)
Chloride: 110 mmol/L (ref 98–111)
Creatinine, Ser: 0.5 mg/dL (ref 0.44–1.00)
GFR calc Af Amer: >60 mL/min (ref >60)
GFR calc non Af Amer: >60 mL/min (ref >60)
Glucose, Bld: 90 mg/dL (ref 70–99)
Potassium: 3.6 mmol/L (ref 3.5–5.1)
Sodium: 137 mmol/L (ref 135–145)
Total Bilirubin: 0.4 mg/dL (ref 0.3–1.2)
Total Protein: 5.9 g/dL — ABNORMAL LOW (ref 6.5–8.1)

## 2017-12-22 LAB — HEPATIC FUNCTION PANEL
ALT: 39 U/L (ref 0–44)
AST: 51 U/L — ABNORMAL HIGH (ref 15–41)
Albumin: 2.6 g/dL — ABNORMAL LOW (ref 3.5–5.0)
Alkaline Phosphatase: 85 U/L (ref 38–126)
Bilirubin, Direct: 0.1 mg/dL (ref 0.0–0.2)
Indirect Bilirubin: 0.4 mg/dL (ref 0.3–0.9)
TOTAL PROTEIN: 5.5 g/dL — AB (ref 6.5–8.1)
Total Bilirubin: 0.5 mg/dL (ref 0.3–1.2)

## 2017-12-22 LAB — PROTEIN / CREATININE RATIO, URINE
CREATININE, URINE: 27 mg/dL
Protein Creatinine Ratio: 0.33 mg/mg{Cre} — ABNORMAL HIGH (ref 0.00–0.15)
Total Protein, Urine: 9 mg/dL

## 2017-12-22 SURGERY — Surgical Case
Anesthesia: Epidural | Site: Abdomen

## 2017-12-22 MED ORDER — CARBOPROST TROMETHAMINE 250 MCG/ML IM SOLN
INTRAMUSCULAR | Status: AC
  Filled 2017-12-22: qty 1

## 2017-12-22 MED ORDER — HYDRALAZINE HCL 20 MG/ML IJ SOLN
5.0000 mg | Freq: Once | INTRAMUSCULAR | Status: AC
  Administered 2017-12-22: 5 mg via INTRAVENOUS
  Filled 2017-12-22: qty 1

## 2017-12-22 MED ORDER — MORPHINE SULFATE (PF) 0.5 MG/ML IJ SOLN
INTRAMUSCULAR | Status: DC | PRN
  Administered 2017-12-22: 2 mg via EPIDURAL
  Administered 2017-12-22: 1 mg via EPIDURAL

## 2017-12-22 MED ORDER — LABETALOL HCL 5 MG/ML IV SOLN
40.0000 mg | INTRAVENOUS | Status: DC | PRN
  Filled 2017-12-22: qty 8

## 2017-12-22 MED ORDER — DIPHENHYDRAMINE HCL 50 MG/ML IJ SOLN: 12.5000 mg | INTRAMUSCULAR | Status: DC | PRN

## 2017-12-22 MED ORDER — NALOXONE HCL 4 MG/10ML IJ SOLN
1.0000 ug/kg/h | INTRAVENOUS | Status: DC | PRN
  Filled 2017-12-22: qty 5

## 2017-12-22 MED ORDER — KETOROLAC TROMETHAMINE 30 MG/ML IJ SOLN
INTRAMUSCULAR | Status: AC
  Administered 2017-12-22: 30 mg via INTRAVENOUS
  Filled 2017-12-22: qty 1

## 2017-12-22 MED ORDER — HYDRALAZINE HCL 20 MG/ML IJ SOLN: 10.0000 mg | INTRAMUSCULAR | Status: DC | PRN

## 2017-12-22 MED ORDER — MISOPROSTOL 50MCG HALF TABLET
ORAL_TABLET | ORAL | Status: AC
  Filled 2017-12-22: qty 1

## 2017-12-22 MED ORDER — METHYLDOPA 250 MG PO TABS
250.0000 mg | ORAL_TABLET | Freq: Three times a day (TID) | ORAL | Status: DC
  Administered 2017-12-22 – 2017-12-23 (×3): 250 mg via ORAL
  Filled 2017-12-22 (×3): qty 1

## 2017-12-22 MED ORDER — DIPHENHYDRAMINE HCL 25 MG PO CAPS: 25.0000 mg | ORAL_CAPSULE | ORAL | Status: DC | PRN

## 2017-12-22 MED ORDER — SCOPOLAMINE 1 MG/3DAYS TD PT72: 1.0000 | MEDICATED_PATCH | Freq: Once | TRANSDERMAL | Status: DC

## 2017-12-22 MED ORDER — LIDOCAINE 5 % EX PTCH
MEDICATED_PATCH | CUTANEOUS | Status: DC | PRN
  Administered 2017-12-22: 1 via TRANSDERMAL

## 2017-12-22 MED ORDER — CEFAZOLIN SODIUM-DEXTROSE 2-4 GM/100ML-% IV SOLN
2.0000 g | Freq: Once | INTRAVENOUS | Status: AC
  Administered 2017-12-22: 2 g via INTRAVENOUS
  Filled 2017-12-22: qty 100

## 2017-12-22 MED ORDER — LABETALOL HCL 5 MG/ML IV SOLN: 20.0000 mg | INTRAVENOUS | Status: DC | PRN

## 2017-12-22 MED ORDER — HYDRALAZINE HCL 20 MG/ML IJ SOLN: 5.0000 mg | INTRAMUSCULAR | Status: DC | PRN

## 2017-12-22 MED ORDER — LACTATED RINGERS IV SOLN
INTRAVENOUS | Status: DC
  Administered 2017-12-22: 07:00:00 via INTRAVENOUS

## 2017-12-22 MED ORDER — NALBUPHINE HCL 10 MG/ML IJ SOLN: 5.0000 mg | Freq: Once | INTRAMUSCULAR | Status: DC | PRN

## 2017-12-22 MED ORDER — EPHEDRINE 5 MG/ML INJ: 10.0000 mg | INTRAVENOUS | Status: DC | PRN

## 2017-12-22 MED ORDER — ONDANSETRON HCL 4 MG/2ML IJ SOLN: 4.0000 mg | Freq: Three times a day (TID) | INTRAMUSCULAR | Status: DC | PRN

## 2017-12-22 MED ORDER — PHENYLEPHRINE 40 MCG/ML (10ML) SYRINGE FOR IV PUSH (FOR BLOOD PRESSURE SUPPORT): 80.0000 ug | PREFILLED_SYRINGE | INTRAVENOUS | Status: DC | PRN

## 2017-12-22 MED ORDER — SOD CITRATE-CITRIC ACID 500-334 MG/5ML PO SOLN
ORAL | Status: AC
  Administered 2017-12-22: 30 mL
  Filled 2017-12-22: qty 15

## 2017-12-22 MED ORDER — LIDOCAINE HCL (PF) 1 % IJ SOLN
INTRAMUSCULAR | Status: AC
  Filled 2017-12-22: qty 30

## 2017-12-22 MED ORDER — MISOPROSTOL 25 MCG QUARTER TABLET
50.0000 ug | ORAL_TABLET | ORAL | Status: DC
  Administered 2017-12-22 (×2): 50 ug via ORAL
  Filled 2017-12-22: qty 1

## 2017-12-22 MED ORDER — KETOROLAC TROMETHAMINE 30 MG/ML IJ SOLN
30.0000 mg | Freq: Four times a day (QID) | INTRAMUSCULAR | Status: AC
  Administered 2017-12-22 – 2017-12-23 (×4): 30 mg via INTRAVENOUS
  Filled 2017-12-22 (×3): qty 1

## 2017-12-22 MED ORDER — LABETALOL HCL 5 MG/ML IV SOLN
INTRAVENOUS | Status: DC | PRN
  Administered 2017-12-22 (×4): 5 mg via INTRAVENOUS

## 2017-12-22 MED ORDER — SODIUM CHLORIDE 0.9% FLUSH: 3.0000 mL | INTRAVENOUS | Status: DC | PRN

## 2017-12-22 MED ORDER — METHYLDOPA 250 MG PO TABS
250.0000 mg | ORAL_TABLET | Freq: Two times a day (BID) | ORAL | Status: DC
  Administered 2017-12-22: 250 mg via ORAL
  Filled 2017-12-22: qty 1

## 2017-12-22 MED ORDER — MAGNESIUM SULFATE 40 G IN LACTATED RINGERS - SIMPLE
2.0000 g/h | INTRAVENOUS | Status: DC
  Administered 2017-12-22: 2 g/h via INTRAVENOUS
  Filled 2017-12-22: qty 500

## 2017-12-22 MED ORDER — LABETALOL HCL 5 MG/ML IV SOLN
20.0000 mg | INTRAVENOUS | Status: DC | PRN
  Administered 2017-12-23: 20 mg via INTRAVENOUS
  Filled 2017-12-22 (×2): qty 4

## 2017-12-22 MED ORDER — LIDOCAINE-EPINEPHRINE (PF) 1.5 %-1:200000 IJ SOLN
INTRAMUSCULAR | Status: DC | PRN
  Administered 2017-12-22: 3 mL

## 2017-12-22 MED ORDER — FENTANYL 2.5 MCG/ML W/ROPIVACAINE 0.15% IN NS 100 ML EPIDURAL (ARMC)
EPIDURAL | Status: AC
  Filled 2017-12-22: qty 100

## 2017-12-22 MED ORDER — NALBUPHINE HCL 10 MG/ML IJ SOLN: 5.0000 mg | INTRAMUSCULAR | Status: DC | PRN

## 2017-12-22 MED ORDER — OXYTOCIN 40 UNITS IN LACTATED RINGERS INFUSION - SIMPLE MED
INTRAVENOUS | Status: AC
  Filled 2017-12-22: qty 1000

## 2017-12-22 MED ORDER — CALCIUM GLUCONATE 10 % IV SOLN
INTRAVENOUS | Status: AC
  Filled 2017-12-22: qty 10

## 2017-12-22 MED ORDER — AMMONIA AROMATIC IN INHA
RESPIRATORY_TRACT | Status: AC
  Filled 2017-12-22: qty 10

## 2017-12-22 MED ORDER — ACETAMINOPHEN 325 MG PO TABS
650.0000 mg | ORAL_TABLET | Freq: Four times a day (QID) | ORAL | Status: AC
  Administered 2017-12-23: 650 mg via ORAL
  Filled 2017-12-22: qty 2

## 2017-12-22 MED ORDER — HYDRALAZINE HCL 20 MG/ML IJ SOLN
5.0000 mg | INTRAMUSCULAR | Status: DC | PRN
  Administered 2017-12-22: 5 mg via INTRAVENOUS

## 2017-12-22 MED ORDER — ONDANSETRON HCL 4 MG/2ML IJ SOLN
INTRAMUSCULAR | Status: DC | PRN
  Administered 2017-12-22: 4 mg via INTRAVENOUS

## 2017-12-22 MED ORDER — MEPERIDINE HCL 25 MG/ML IJ SOLN: 6.2500 mg | INTRAMUSCULAR | Status: DC | PRN

## 2017-12-22 MED ORDER — METHYLERGONOVINE MALEATE 0.2 MG/ML IJ SOLN
INTRAMUSCULAR | Status: AC
  Filled 2017-12-22: qty 1

## 2017-12-22 MED ORDER — MAGNESIUM SULFATE 40 G IN LACTATED RINGERS - SIMPLE
2.0000 g/h | INTRAVENOUS | Status: AC
  Administered 2017-12-22: 2 g/h via INTRAVENOUS
  Filled 2017-12-22: qty 500

## 2017-12-22 MED ORDER — OXYTOCIN 40 UNITS IN LACTATED RINGERS INFUSION - SIMPLE MED
2.5000 [IU]/h | INTRAVENOUS | Status: AC
  Administered 2017-12-22 (×2): 2.5 [IU]/h via INTRAVENOUS
  Filled 2017-12-22: qty 1000

## 2017-12-22 MED ORDER — FENTANYL 2.5 MCG/ML W/ROPIVACAINE 0.15% IN NS 100 ML EPIDURAL (ARMC): 12.0000 mL/h | EPIDURAL | Status: DC

## 2017-12-22 MED ORDER — LIDOCAINE HCL (CARDIAC) PF 100 MG/5ML IV SOSY
PREFILLED_SYRINGE | INTRAVENOUS | Status: DC | PRN
  Administered 2017-12-22 (×4): 5 mL via INTRAVENOUS

## 2017-12-22 MED ORDER — MISOPROSTOL 200 MCG PO TABS
ORAL_TABLET | ORAL | Status: AC
  Filled 2017-12-22: qty 4

## 2017-12-22 MED ORDER — LIDOCAINE 5 % EX PTCH
MEDICATED_PATCH | CUTANEOUS | Status: AC
  Filled 2017-12-22: qty 1

## 2017-12-22 MED ORDER — LABETALOL HCL 5 MG/ML IV SOLN: 40.0000 mg | INTRAVENOUS | Status: DC | PRN

## 2017-12-22 MED ORDER — HYDRALAZINE HCL 20 MG/ML IJ SOLN
10.0000 mg | Freq: Once | INTRAMUSCULAR | Status: AC
  Administered 2017-12-22: 10 mg via INTRAVENOUS

## 2017-12-22 MED ORDER — OXYTOCIN 10 UNIT/ML IJ SOLN
INTRAMUSCULAR | Status: AC
  Filled 2017-12-22: qty 2

## 2017-12-22 MED ORDER — MORPHINE SULFATE (PF) 0.5 MG/ML IJ SOLN
INTRAMUSCULAR | Status: AC
  Filled 2017-12-22: qty 10

## 2017-12-22 MED ORDER — MAGNESIUM SULFATE BOLUS VIA INFUSION
4.0000 g | Freq: Once | INTRAVENOUS | Status: AC
  Administered 2017-12-22: 4 g via INTRAVENOUS
  Filled 2017-12-22: qty 500

## 2017-12-22 MED ORDER — SODIUM CHLORIDE 0.9 % IV SOLN
INTRAVENOUS | Status: DC | PRN
  Administered 2017-12-22 (×3): 5 mL via EPIDURAL

## 2017-12-22 MED ORDER — NALOXONE HCL 0.4 MG/ML IJ SOLN: 0.4000 mg | INTRAMUSCULAR | Status: DC | PRN

## 2017-12-22 MED ORDER — NALBUPHINE HCL 10 MG/ML IJ SOLN
5.0000 mg | INTRAMUSCULAR | Status: DC | PRN
  Administered 2017-12-22 – 2017-12-23 (×2): 5 mg via INTRAVENOUS
  Filled 2017-12-22 (×2): qty 1

## 2017-12-22 MED ORDER — HYDRALAZINE HCL 20 MG/ML IJ SOLN
INTRAMUSCULAR | Status: AC
  Filled 2017-12-22: qty 1

## 2017-12-22 MED ORDER — LIDOCAINE HCL (PF) 2 % IJ SOLN
INTRAMUSCULAR | Status: AC
  Filled 2017-12-22: qty 40

## 2017-12-22 MED ORDER — OXYTOCIN 40 UNITS IN LACTATED RINGERS INFUSION - SIMPLE MED
INTRAVENOUS | Status: DC | PRN
  Administered 2017-12-22: 1000 mL via INTRAVENOUS

## 2017-12-22 MED ORDER — ONDANSETRON HCL 4 MG/2ML IJ SOLN
INTRAMUSCULAR | Status: AC
  Filled 2017-12-22: qty 2

## 2017-12-22 MED ORDER — FENTANYL 2.5 MCG/ML W/ROPIVACAINE 0.15% IN NS 100 ML EPIDURAL (ARMC)
EPIDURAL | Status: DC | PRN
  Administered 2017-12-22: 12 mL/h via EPIDURAL

## 2017-12-22 SURGICAL SUPPLY — 25 items
ADHESIVE MASTISOL STRL (MISCELLANEOUS) ×2 IMPLANT
BAG COUNTER SPONGE EZ (MISCELLANEOUS) ×2 IMPLANT
BENZOIN TINCTURE PRP APPL 2/3 (GAUZE/BANDAGES/DRESSINGS) ×2 IMPLANT
CANISTER SUCT 3000ML PPV (MISCELLANEOUS) ×2 IMPLANT
CHLORAPREP W/TINT 26ML (MISCELLANEOUS) ×4 IMPLANT
COVER WAND RF STERILE (DRAPES) ×2 IMPLANT
DRSG TELFA 3X8 NADH (GAUZE/BANDAGES/DRESSINGS) ×2 IMPLANT
GAUZE SPONGE 4X4 12PLY STRL (GAUZE/BANDAGES/DRESSINGS) ×2 IMPLANT
GLOVE BIOGEL PI ORTHO PRO 7.5 (GLOVE) ×1
GLOVE PI ORTHO PRO STRL 7.5 (GLOVE) ×1 IMPLANT
GOWN STRL REUS W/ TWL LRG LVL3 (GOWN DISPOSABLE) ×2 IMPLANT
GOWN STRL REUS W/TWL LRG LVL3 (GOWN DISPOSABLE) ×2
KIT TURNOVER KIT A (KITS) ×2 IMPLANT
NS IRRIG 1000ML POUR BTL (IV SOLUTION) ×2 IMPLANT
PACK C SECTION AR (MISCELLANEOUS) ×2 IMPLANT
PAD OB MATERNITY 4.3X12.25 (PERSONAL CARE ITEMS) ×2 IMPLANT
PAD PREP 24X41 OB/GYN DISP (PERSONAL CARE ITEMS) ×2 IMPLANT
RETRACTOR WND ALEXIS-O 25 LRG (MISCELLANEOUS) ×1 IMPLANT
RTRCTR WOUND ALEXIS O 25CM LRG (MISCELLANEOUS) ×2
SPONGE LAP 18X18 RF (DISPOSABLE) ×2 IMPLANT
STRIP CLOSURE SKIN 1/2X4 (GAUZE/BANDAGES/DRESSINGS) ×2 IMPLANT
SUT VIC AB 0 CTX 36 (SUTURE) ×2
SUT VIC AB 0 CTX36XBRD ANBCTRL (SUTURE) ×2 IMPLANT
SUT VIC AB 1 CT1 36 (SUTURE) ×4 IMPLANT
SUT VICRYL+ 3-0 36IN CT-1 (SUTURE) ×4 IMPLANT

## 2017-12-22 NOTE — Anesthesia Post-op Follow-up Note (Signed)
Anesthesia QCDR form completed.        

## 2017-12-22 NOTE — Transfer of Care (Signed)
Immediate Anesthesia Transfer of Care Note  Patient: Sydney James  Procedure(s) Performed: CESAREAN SECTION (N/A Abdomen)  Patient Location: PACU  Anesthesia Type:Epidural  Level of Consciousness: awake, alert  and oriented  Airway & Oxygen Therapy: Patient Spontanous Breathing  Post-op Assessment: Report given to RN and Post -op Vital signs reviewed and stable  Post vital signs: Reviewed and stable  Last Vitals:  Vitals Value Taken Time  BP 149/102 12/22/2017  5:52 PM  Temp 36.7 C 12/22/2017  5:52 PM  Pulse 72 12/22/2017  5:52 PM  Resp 12 12/22/2017  5:52 PM  SpO2 98 % 12/22/2017  5:52 PM  Vitals shown include unvalidated device data.  Last Pain:  Vitals:   12/22/17 1752  TempSrc:   PainSc: 2       Patients Stated Pain Goal: 0 (12/22/17 1520)  Complications: No apparent anesthesia complications

## 2017-12-22 NOTE — Op Note (Signed)
      OP NOTE  Date: 12/22/2017   5:44 PM Name Sydney James MR# [id number]  Preoperative Diagnosis: 1.Fetal intolerance to labor Active Problems:   Gestational hypertension   Preterm labor  2.  PIH  Postoperative Diagnosis: 1. Intrauterine pregnancy at 3562w0d, delivered 2. Viable infant 3. Remainder same as pre-op   Procedure: 1. Primary Low-Transverse Cesarean Section  Surgeon: Elonda Huskyavid J. , MD  Assistant:  Doreene BurkeAnnie Thompson CNM  Anesthesia: Epidural;Spinal    EBL: 400 ml    Findings: 1) female infant 2) Normal uterus, tubes and ovaries.    Procedure:  The patient was prepped and draped in the supine position and placed under spinal anesthesia.  A transverse incision was made across the abdomen in a Pfannenstiel manner. If indicated the old scar was systematically removed with sharp dissection.  We carried the dissection down to the level of the fascia.  The fascia was incised in a curvilinear manner.  The fascia was then elevated from the rectus muscles with blunt and sharp dissection.  The rectus muscles were separated laterally exposing the peritoneum.  The peritoneum was carefully entered with care being taken to avoid bowel and bladder.  A self-retaining retractor was placed.  The visceral peritoneum was incised in a curvilinear fashion across the lower uterine segment creating a bladder flap. A transverse incision was made across the lower uterine segment and extended laterally and superiorly using the bandage scissors.  Artificial rupture membranes was performed and Clear fluid was noted.  The infant was delivered from the cephalic position.  A nuchal cord was not present. After an appropriate time interval, the cord was doubly clamped and cut. Cord blood was obtained if required.  The infant was handed to the pediatric personnel  who then placed the infant under heat lamps where it was cleaned dried and suctioned as needed. The placenta was delivered. The hysterotomy  incision was then identified on ring forceps.  The uterine cavity was cleaned with a moist lap sponge.  The hysterotomy incision was closed with a running interlocking suture of Vicryl.  Hemostasis was excellent.  Pitocin was run in the IV and the uterus was found to be firm. The posterior cul-de-sac and gutters were cleaned and inspected.  Hemostasis was noted.  The fascia was then closed with a running suture of #1 Vicryl.  Hemostasis of the subcutaneous tissues was obtained using the Bovie.  The subcutaneous tissues were closed with a running suture of 000 Vicryl.  A subcuticular suture was placed.  Steri-Strips were applied in the usual manner.  A pressure dressing was placed.  The patient went to the recovery room in stable condition.   Elonda Huskyavid J. , M.D. 12/22/2017 5:44 PM

## 2017-12-22 NOTE — Progress Notes (Signed)
Patient given multiple doses of labetalol per PRN orders due to hypertension. Patient complaining of headache, epigastric pain, nausea, and is diaphoretic. Dr. Valentino Saxonherry notified and MD order to give hydralazine 5 mg then proceed with 10 mg if needed. Medications given per MD orders. Patient blood pressure improved to 157/91 at 0409. Blood pressure rechecked and was 167/97 at 0447. Dr. Valentino Saxonherry notified again and MD order to transfer back to Labor and delivery and to restart mag and draw appropriate labs. Patient then complained of shortness of breath and facial swelling. Bilateral lung sounds clear. Patient then transferred back to LDR 3 accompanied by RN.

## 2017-12-22 NOTE — Progress Notes (Addendum)
LABOR NOTE   Sydney James 22 y.o.@ at 60103w0d Early latent labor.  SUBJECTIVE:  RN called to discuss fetal heart tracing.  OBJECTIVE:  BP (!) 148/94   Pulse 75   Temp 98.2 F (36.8 C) (Oral)   Resp 16   Ht 5\' 7"  (1.702 m)   Wt 103.9 kg   LMP 04/28/2017   SpO2 99%   Breastfeeding Unknown   BMI 35.87 kg/m  Total I/O In: 2170.2 [I.V.:2155; IV Piggyback:15.1] Out: 4860 [Urine:4460; Blood:400]   CERVIX: per RN exam:  SVE:   Dilation: 4 Effacement (%): 70 Station: -2 Exam by:: Evans MD CONTRACTIONS: regular, every 2-3 minutes FHR: Fetal heart tracing reviewed. Baseline: 130 bpm, Variability: Good {> 6 bpm), Accelerations: no accels  and Decelerations: late and variable present Category II   Analgesia: Epidural  Labs: Lab Results  Component Value Date   WBC 12.8 (H) 12/22/2017   HGB 10.2 (L) 12/22/2017   HCT 31.2 (L) 12/22/2017   MCV 90.2 12/22/2017   PLT 246 12/22/2017    ASSESSMENT: 1) Labor curve reviewed.       Progress: Early latent labor. and Adequate uterine activity - intensity and frequency.     Membranes: ruptured, clear fluid         Active Problems Pre eclampsia   PLAN: Dr. Logan BoresEvans consulted on fetal monitor strip and plan of care.  Maternal oxygen administration, IV fluid bolus and change maternal position. IUPC in place with adequate contractions for several hours with minimal cervical change. Discussed monitor strip with pt and family member along with progress of labor. Pt and family in agreement to cesarean section for fetal intolerance of labor. Dr. Logan BoresEvans consulted, patient consented for primary cesarean section.   Doreene Burkennie Chaeli Judy, CNM   12/22/2017 5:47 PM

## 2017-12-22 NOTE — Progress Notes (Addendum)
LABOR NOTE   Sydney James 22 y.o.@ at 5830w0d Early latent labor.  SUBJECTIVE:  Doing well with epidural , does not feel contractions. Denies headache, epigastric pain , and visual changes. OBJECTIVE:  BP 131/81   Pulse 81   Temp 98.1 F (36.7 C) (Oral)   Resp 16   Ht 5\' 7"  (1.702 m)   Wt 103.9 kg   LMP 04/28/2017   SpO2 98%   BMI 35.87 kg/m  Total I/O In: 623.8 [I.V.:623.8] Out: 2960 [Urine:2960]  Lungs clear bilaterally 2 + reflexes  No clonus   She has shown cervical change. CERVIX: 4cm:   SVE:   Dilation: 4 Effacement (%): 70 Station: -2 Exam by:: Janee Mornhompson CNM CONTRACTIONS: irregular, every 3-5 minutes FHR: Fetal heart tracing reviewed. Baseline: 130's bpm, Variability: Good {> 6 bpm), Accelerations: Reactive, Decelerations: Difficult to differenciate due to contractions not tracing well. AROM  and placement of IUPC Category II   Analgesia: Epidural  Labs: Lab Results  Component Value Date   WBC 12.8 (H) 12/22/2017   HGB 10.2 (L) 12/22/2017   HCT 31.2 (L) 12/22/2017   MCV 90.2 12/22/2017   PLT 246 12/22/2017    ASSESSMENT: 1) Labor curve reviewed.       Progress: Early latent labor.     Membranes: ruptured, clear fluid     IUPC placed        Active Problems:   Gestational hypertension   Preterm labor pre eclampsia  PLAN: continue present management   Doreene Burkennie Dennie Vecchio, CNM  12/22/2017 12:53 PM

## 2017-12-22 NOTE — Progress Notes (Signed)
LABOR NOTE   Sydney James 22 y.o.@ at 31101w0d Not in labor, being induced for pre eclampsia.    SUBJECTIVE: Pt denies headache, epigastric pain and visual changes at this time  OBJECTIVE:  BP (!) 147/93   Pulse 76   Temp 98 F (36.7 C) (Oral)   Resp 18   Ht 5\' 7"  (1.702 m)   Wt 103.9 kg   LMP 04/28/2017   SpO2 98%   BMI 35.87 kg/m  Total I/O In: 498.9 [I.V.:498.9] Out: 2660 [Urine:2660]  She has not shown cervical change. CERVIX: 1cm: Thick, -3 station, Foley bulb placed  CONTRACTIONS: none FHR: Fetal heart tracing reviewed. Baseline: 130 bpm, Variability: Good {> 6 bpm), Accelerations: Reactive and Decelerations: Absent Category I   Analgesia: Labor support without medications  Labs: Lab Results  Component Value Date   WBC 12.8 (H) 12/22/2017   HGB 10.2 (L) 12/22/2017   HCT 31.2 (L) 12/22/2017   MCV 90.2 12/22/2017   PLT 246 12/22/2017    ASSESSMENT: 1) Labor curve reviewed.       Progress: Not in labor.     Membranes: intact     Foley bulb placed     First dose of cytotec        Active Problems:   Gestational hypertension   Preterm labor   PLAN: continue present management Repeat labs this evening Aldomet as ordered Labetalol discontnues Hydralazine IV per protocol for severe range BP's Continue Magnesium sulfate   Plan of care reviewed with Dr. Logan BoresEvans.   Doreene Burkennie Nyjah Schwake, CNM  12/22/2017

## 2017-12-22 NOTE — Anesthesia Preprocedure Evaluation (Signed)
Anesthesia Evaluation  Patient identified by MRN, date of birth, ID band Patient awake    Reviewed: Allergy & Precautions, NPO status , Patient's Chart, lab work & pertinent test results  History of Anesthesia Complications Negative for: history of anesthetic complications  Airway Mallampati: III  TM Distance: >3 FB Neck ROM: Full    Dental no notable dental hx.    Pulmonary neg pulmonary ROS, neg sleep apnea, neg COPD,    breath sounds clear to auscultation- rhonchi (-) wheezing      Cardiovascular hypertension, (-) CAD, (-) Past MI, (-) Cardiac Stents and (-) CABG  Rhythm:Regular Rate:Normal - Systolic murmurs and - Diastolic murmurs    Neuro/Psych  Headaches, negative psych ROS   GI/Hepatic negative GI ROS, Neg liver ROS,   Endo/Other  negative endocrine ROSneg diabetes  Renal/GU negative Renal ROS     Musculoskeletal negative musculoskeletal ROS (+)   Abdominal (+) + obese,   Peds  Hematology negative hematology ROS (+)   Anesthesia Other Findings Past Medical History: No date: Lumbar herniated disc No date: Migraine No date: Preeclampsia No date: PVC's (premature ventricular contractions) No date: Raynaud's disease No date: Scoliosis     Comment:  Back brace for 2 years No date: Scoliosis   Reproductive/Obstetrics (+) Pregnancy                             Lab Results  Component Value Date   WBC 12.8 (H) 12/22/2017   HGB 10.2 (L) 12/22/2017   HCT 31.2 (L) 12/22/2017   MCV 90.2 12/22/2017   PLT 246 12/22/2017    Anesthesia Physical Anesthesia Plan  ASA: II  Anesthesia Plan: Epidural   Post-op Pain Management:    Induction:   PONV Risk Score and Plan: 2  Airway Management Planned:   Additional Equipment:   Intra-op Plan:   Post-operative Plan:   Informed Consent: I have reviewed the patients History and Physical, chart, labs and discussed the procedure  including the risks, benefits and alternatives for the proposed anesthesia with the patient or authorized representative who has indicated his/her understanding and acceptance.     Plan Discussed with: CRNA and Anesthesiologist  Anesthesia Plan Comments: (Plan for epidural for labor, discussed epidural vs spinal vs GA if need for csection)        Anesthesia Quick Evaluation

## 2017-12-22 NOTE — Progress Notes (Addendum)
LABOR NOTE   Sydney James 22 y.o.@ at 7219w0d Early latent labor.  SUBJECTIVE:  Comfortable with epidural. RN called with concern of fetal heart rate tracing.  OBJECTIVE:  BP (!) 148/94   Pulse 75   Temp 98.2 F (36.8 C) (Oral)   Resp 16   Ht 5\' 7"  (1.702 m)   Wt 103.9 kg   LMP 04/28/2017   SpO2 99%   Breastfeeding Unknown   BMI 35.87 kg/m  Total I/O In: 2170.2 [I.V.:2155; IV Piggyback:15.1] Out: 4860 [Urine:4460; Blood:400]  She has not shown cervical change. CERVIX: not evaluated:  SVE:   Dilation: 4 Effacement (%): 70 Station: -2 Exam by:: Evans MD CONTRACTIONS: regular, every 2-3 minutes, IUPC adequate contractions FHR: Fetal heart tracing reviewed. Baseline: 130 bpm, Variability: Good {> 6 bpm), Accelerations: Reactive and Decelerations: occasional late and variable Category II   Analgesia: Epidural  Labs: Lab Results  Component Value Date   WBC 12.8 (H) 12/22/2017   HGB 10.2 (L) 12/22/2017   HCT 31.2 (L) 12/22/2017   MCV 90.2 12/22/2017   PLT 246 12/22/2017    ASSESSMENT: 1) Labor curve reviewed.       Progress: Early latent labor.     Membranes: ruptured, clear fluid      Active problems: Pre eclampsia   PLAN: maternal oxygen administration, IV fluid bolus and change maternal position Dr. Logan BoresEvans consulted on strip.   Sydney James, CNM  12/22/2017 5:41 PM

## 2017-12-22 NOTE — Anesthesia Procedure Notes (Signed)
Epidural Patient location during procedure: OB Start time: 12/22/2017 11:43 AM End time: 12/22/2017 12:03 PM  Staffing Anesthesiologist: Alver FisherPenwarden, Skylen Danielsen, MD Performed: anesthesiologist   Preanesthetic Checklist Completed: patient identified, site marked, surgical consent, pre-op evaluation, timeout performed, IV checked, risks and benefits discussed and monitors and equipment checked  Epidural Patient position: sitting Prep: ChloraPrep Patient monitoring: heart rate, continuous pulse ox and blood pressure Approach: midline Location: L3-L4 Injection technique: LOR saline  Needle:  Needle type: Tuohy  Needle gauge: 18 G Needle length: 9 cm and 9 Needle insertion depth: 7.5 cm Catheter type: closed end flexible Catheter size: 20 Guage Catheter at skin depth: 12 cm Test dose: negative (0.125% bupivacaine)  Assessment Events: blood not aspirated, injection not painful, no injection resistance, negative IV test and no paresthesia  Additional Notes   Patient tolerated the insertion well without complications.Reason for block:procedure for pain

## 2017-12-23 ENCOUNTER — Other Ambulatory Visit: Payer: Self-pay | Admitting: Certified Nurse Midwife

## 2017-12-23 LAB — CBC
HCT: 29.5 % — ABNORMAL LOW (ref 36.0–46.0)
Hemoglobin: 9.3 g/dL — ABNORMAL LOW (ref 12.0–15.0)
MCH: 29.3 pg (ref 26.0–34.0)
MCHC: 31.5 g/dL (ref 30.0–36.0)
MCV: 93.1 fL (ref 80.0–100.0)
Platelets: 221 10*3/uL (ref 150–400)
RBC: 3.17 MIL/uL — ABNORMAL LOW (ref 3.87–5.11)
RDW: 13.2 % (ref 11.5–15.5)
WBC: 13.1 10*3/uL — ABNORMAL HIGH (ref 4.0–10.5)
nRBC: 0 % (ref 0.0–0.2)

## 2017-12-23 MED ORDER — LACTATED RINGERS IV SOLN
INTRAVENOUS | Status: DC
  Administered 2017-12-23: 10:00:00 via INTRAVENOUS

## 2017-12-23 MED ORDER — IBUPROFEN 600 MG PO TABS
600.0000 mg | ORAL_TABLET | Freq: Four times a day (QID) | ORAL | Status: DC
  Administered 2017-12-23 – 2017-12-25 (×8): 600 mg via ORAL
  Filled 2017-12-23 (×8): qty 1

## 2017-12-23 MED ORDER — METHYLDOPA 250 MG PO TABS
500.0000 mg | ORAL_TABLET | Freq: Two times a day (BID) | ORAL | Status: DC
  Administered 2017-12-24: 500 mg via ORAL
  Filled 2017-12-23 (×2): qty 2

## 2017-12-23 MED ORDER — METHYLDOPA 250 MG PO TABS
250.0000 mg | ORAL_TABLET | Freq: Once | ORAL | Status: AC
  Administered 2017-12-23: 250 mg via ORAL
  Filled 2017-12-23: qty 1

## 2017-12-23 MED ORDER — SENNOSIDES-DOCUSATE SODIUM 8.6-50 MG PO TABS
2.0000 | ORAL_TABLET | ORAL | Status: DC
  Administered 2017-12-23 – 2017-12-24 (×2): 2 via ORAL
  Filled 2017-12-23 (×3): qty 2

## 2017-12-23 MED ORDER — DIPHENHYDRAMINE HCL 25 MG PO CAPS: 25.0000 mg | ORAL_CAPSULE | Freq: Four times a day (QID) | ORAL | Status: DC | PRN

## 2017-12-23 MED ORDER — METHYLDOPA 250 MG PO TABS: 500.0000 mg | ORAL_TABLET | Freq: Two times a day (BID) | ORAL | Status: DC

## 2017-12-23 MED ORDER — PRENATAL MULTIVITAMIN CH
1.0000 | ORAL_TABLET | Freq: Every day | ORAL | Status: DC
  Administered 2017-12-23 – 2017-12-24 (×2): 1 via ORAL
  Filled 2017-12-23 (×3): qty 1

## 2017-12-23 MED ORDER — MENTHOL 3 MG MT LOZG
1.0000 | LOZENGE | OROMUCOSAL | Status: DC | PRN
  Filled 2017-12-23: qty 9

## 2017-12-23 MED ORDER — SIMETHICONE 80 MG PO CHEW
80.0000 mg | CHEWABLE_TABLET | Freq: Four times a day (QID) | ORAL | Status: DC
  Administered 2017-12-23 – 2017-12-25 (×6): 80 mg via ORAL
  Filled 2017-12-23 (×7): qty 1

## 2017-12-23 MED ORDER — ZOLPIDEM TARTRATE 5 MG PO TABS: 5.0000 mg | ORAL_TABLET | Freq: Every evening | ORAL | Status: DC | PRN

## 2017-12-23 NOTE — Anesthesia Post-op Follow-up Note (Signed)
  Anesthesia Pain Follow-up Note  Patient: Sydney PrinceMichaela James  Day #: 1  Date of Follow-up: 12/23/2017 Time: 5:49 PM  Last Vitals:  Vitals:   12/23/17 1540 12/23/17 1545  BP:    Pulse:    Resp:    Temp:    SpO2: 100% 100%    Level of Consciousness: alert  Pain: mild   Side Effects:None  Catheter Site Exam:clean, dry, no drainage     Plan: D/C from anesthesia care at surgeon's request  Taden Witter

## 2017-12-23 NOTE — Addendum Note (Signed)
Addendum  created 12/23/17 1749 by Yves Dillarroll, Clifford Benninger, MD   Clinical Note Signed

## 2017-12-23 NOTE — Anesthesia Postprocedure Evaluation (Signed)
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  Procedure(s) Performed: CESAREAN SECTION (N/A Abdomen)  Patient location during evaluation: L&D Anesthesia Type: Epidural Level of consciousness: awake and alert and oriented Pain management: pain level controlled Vital Signs Assessment: post-procedure vital signs reviewed and stable Respiratory status: spontaneous breathing Cardiovascular status: blood pressure returned to baseline Postop Assessment: no headache Anesthetic complications: no     Last Vitals:  Vitals:   12/23/17 1540 12/23/17 1545  BP:    Pulse:    Resp:    Temp:    SpO2: 100% 100%    Last Pain:  Vitals:   12/23/17 1506  TempSrc: Oral  PainSc:                  Amritpal Shropshire

## 2017-12-23 NOTE — Progress Notes (Signed)
Patient ID: Sydney James Swatek, female   DOB: [birth year], 22 y.o.   MRN: [id number]    Progress Note - Cesarean Delivery  Sydney James Meisinger is a 22 y.o. [id number] now PP day 1 s/p C-Section, Low Transverse .   Subjective:  Patient reports no problems with eating, bowel movements, voiding, or their wound    Objective:  Vital signs in last 24 hours: Temp:  [97.8 F (36.6 C)-98.2 F (36.8 C)] 97.8 F (36.6 C) (12/21 0607) Pulse Rate:  [57-83] 73 (12/21 0809) Resp:  [10-23] 18 (12/21 0700) BP: (131-169)/(80-102) 136/87 (12/21 0809) SpO2:  [95 %-100 %] 97 % (12/21 0900)  Physical Exam:  General: alert, cooperative and no distress Lochia: appropriate Uterine Fundus: firm Incision: Dressing intact - dry DVT Evaluation: No evidence of DVT seen on physical exam.    Data Review Recent Labs    12/22/17 0515 12/23/17 0643  HGB 10.2* 9.3*  HCT 31.2* 29.5*    Assessment:  Active Problems:   Gestational hypertension   Preterm labor   Status post Cesarean section. Doing well postoperatively.   Bps better.  (PIH)  Urine output good.  Plan:       Continue current care.  Cont Mg until 3PM  Then begin routine Post-op care.  Elonda Huskyavid J. Nas Wafer, M.D. 12/23/2017 10:00 AM

## 2017-12-23 NOTE — Plan of Care (Signed)
Pt. Transferred to room 338 from L&D. Alert and oriented with pleasant affect. Color good skin w&d. BBS clear. B/P is 144/92. Denies PIH S/S. C/O incisional pain and Percocet given as per PRN order. Oriented to room and Fall Precautions. Pt. V/O.

## 2017-12-24 MED ORDER — METHYLDOPA 250 MG PO TABS
500.0000 mg | ORAL_TABLET | Freq: Three times a day (TID) | ORAL | Status: DC
  Administered 2017-12-24 – 2017-12-25 (×3): 500 mg via ORAL
  Filled 2017-12-24 (×6): qty 2

## 2017-12-24 NOTE — Lactation Note (Signed)
This note was copied from a baby's chart. Lactation Consultation Note  Patient Name: Boy Joella PrinceMichaela Seaberg ZOXWR'UToday's Date: 12/24/2017    [name] is 34 weeks premature in SCN for respiratory distress. Mom concerned that she is not producing enough milk since he is getting 10 ml donor breast milk every 3 hours by tube/gavage and plan is to increase by 5 ml twice a day.  For Luca's first 4 feedings mom was pumping enough to supply what he was taking and now she is only pumping drops.  Explained consistency of colostrum as opposed to mature milk when it transitions and normal course of lactation.  Demonstrated hand expression and encouraged to try warmth, breast massage and hand expression before pumping around every 3 hours.  Mom voiced tenderness when hand expressing.  Coconut oil given and discussed how to use.  Pump flange size increased to #27.  For next pumping mom got 6 ml.  Mom reports her last baby was in NICU and she pumped for 4 months before her milk started dwindling.  Discussed super pumping, power pumping, foods and galactogogues to increase milk supply and hand out given to utilize if milk started to decrease after mature milk came in.  She reports that her first baby got so many bottles before she was able to put baby to breast, that he never would breast feed.  Mom could had challenges with breast feeding also because her nipples are flat.  Pumping does help evert.  Described nipple shield and how that might assist with breast feeding in beginning as well.  Encouraged mom to call lactation for assistance once she was able to put [name] to the breast.  All of Luca's feedings are tube/gavage right now.  She would like to put [name] to the breast before he gets bottles if possible.  She expressed this desire to Dr. Joana Reameravanzo as well as nurses in SCN.  Mom reports having a Medela pump at home that she borrowed from a friend.  Mom has [employer].  Discussed process of getting DEBP through [employer] benefit and got  order for breast pump from Dr. Joana Reameravanzo.  Father of baby is starting process now.  Lactation name and number written on white board and encouraged to call with any questions, concerns or assistance.          Maternal Data    Feeding Feeding Type: Donor Breast Milk  LATCH Score                   Interventions    Lactation Tools Discussed/Used     Consult Status      Louis MeckelWilliams, Herron Fero Kay 12/24/2017, 5:27 PM

## 2017-12-24 NOTE — Progress Notes (Addendum)
Progress Note - Cesarean Delivery  Sydney James is a 22 y.o. [id number] now PP day 2 s/p C-Section, Low Transverse .   Subjective:  Patient reports no problems with eating, bowel movements, voiding, or their wound. PT denies headache, visual changes, and epigastric pain.     Objective:  Vital signs in last 24 hours: Temp:  [97.7 F (36.5 C)-98.6 F (37 C)] 98.6 F (37 C) (12/22 0346) Pulse Rate:  [68-82] 78 (12/22 0907) Resp:  [18] 18 (12/22 0346) BP: (130-169)/(80-105) 152/96 (12/22 0949) SpO2:  [97 %-100 %] 99 % (12/22 0346)  Physical Exam:  General: alert, cooperative, appears stated age, fatigued and no distress  Lungs clear bilaterally Lochia: appropriate Uterine Fundus: firm Incision: dressing dry and intact. TO be removed today in shower Reflexes 2+ bilaterally  No clonus  DVT Evaluation: No evidence of DVT seen on physical exam. No cords or calf tenderness. No significant calf/ankle edema.    Data Review Recent Labs    12/22/17 0515 12/23/17 0643  HGB 10.2* 9.3*  HCT 31.2* 29.5*    Assessment:  Active problems;   Pre eclampsia   Preterm delivery   Primary Cesarean section   Status post Cesarean section. Doing well postoperatively.  Continue to monitor blood pressure today.    Plan:       Continue current care.Increase Aldomet 500 mg to TID.  Dr. Logan BoresEvans consulted on plan of care.     Sydney James, CNM  12/24/2017 10:21 AM

## 2017-12-24 NOTE — Progress Notes (Signed)
A. Janee Mornhompson CNM informed of Pt. B/P of 150/105 at 2207 and that Pt.'s Scheduled Methyldopa was given as per order. Provider stated she wanted to be notified if B/P is >/= 160/110. Pt. Is alert and oriented with pleasant affect and denies any other PIH s/s.

## 2017-12-25 ENCOUNTER — Ambulatory Visit: Payer: Self-pay

## 2017-12-25 ENCOUNTER — Encounter: Payer: Self-pay | Admitting: Obstetrics and Gynecology

## 2017-12-25 MED ORDER — OXYCODONE-ACETAMINOPHEN 5-325 MG PO TABS: 1.0000 | ORAL_TABLET | ORAL | 0 refills | Status: DC | PRN

## 2017-12-25 MED ORDER — METHYLDOPA 500 MG PO TABS: 500.0000 mg | ORAL_TABLET | Freq: Three times a day (TID) | ORAL | 6 refills | Status: DC

## 2017-12-25 MED ORDER — IBUPROFEN 600 MG PO TABS: 600.0000 mg | ORAL_TABLET | Freq: Four times a day (QID) | ORAL | 0 refills | Status: DC

## 2017-12-25 NOTE — Lactation Note (Signed)
This note was copied from a baby's chart. Lactation Consultation Note  Patient Name: [name] FAOZH'YToday's Date: 12/25/2017   Mom has been discharged today [name]  Parents still experiencing challenges with ordering DEBP through [employer].  Information given on how to go through [employer] to get DEBP.  Mom still has [facility] pump.  Encouraged her to continue to pump every 3 hours as mature milk is transitioning.  Reviewed pumping, collection, storage, labeling and transporting of breast milk for [name].  Encouraged parents to call with any questions, concerns or assistance.  Maternal Data    Feeding Feeding Type: Breast Milk  LATCH Score                   Interventions    Lactation Tools Discussed/Used     Consult Status      Louis MeckelWilliams, Yacine Droz Kay 12/25/2017, 9:35 PM

## 2017-12-25 NOTE — Discharge Summary (Signed)
Physician Obstetric Discharge Summary  Patient ID: Sydney James MRN: [id number] DOB/AGE: 26-Jan-1995 22 y.o.   Date of Admission: 12/19/2017  Date of Discharge: 12/25/2017  Admitting Diagnosis: Induction of labor at 3478w0d for pre eclampsia   Mode of Delivery: primary cesarean section       low uterine, transverse     Discharge Diagnosis: Preeclampsia (severe) and Reasons for cesarean section  Non-reassuring FHR   Intrapartum Procedures: Atificial rupture of membranes, epidural and placement of intrauterine catheter   Post partum procedures: none  Complications: none                        Discharge Day SOAP Note:  Subjective:  The patient has no complaints.  She is ambulating well. She is taking PO well. Pain is well controlled with current medications. Patient is urinating without difficulty.   She is passing flatus.   Objective  Vital signs in last 24 hours: BP 140/84   Pulse 83   Temp 97.9 F (36.6 C) (Oral)   Resp 20   Ht 5\' 7"  (1.702 m)   Wt 103.9 kg   LMP 04/28/2017   SpO2 98%   Breastfeeding Unknown   BMI 35.87 kg/m   Physical Exam: Gen: NAD Abdomen:  clean, dry, no drainage, healing Fundus Fundal Tone: Firm  Lochia Amount: Small     Data Review Labs: CBC Latest Ref Rng & Units 12/23/2017 12/22/2017 12/20/2017  WBC 4.0 - 10.5 K/uL 13.1(H) 12.8(H) 13.6(H)  Hemoglobin 12.0 - 15.0 g/dL 7.8(G9.3(L) 10.2(L) 10.2(L)  Hematocrit 36.0 - 46.0 % 29.5(L) 31.2(L) 31.6(L)  Platelets 150 - 400 K/uL 221 246 259   O POS  Assessment:  Active Problems:   Gestational hypertension   Preterm labor   Doing well.  Normal progress as expected.     Plan:  Discharge to home  Modified rest as directed - may slowly resume normal activities with restrictions  as discussed.  Medications as written.  See below for additional.       Discharge Instructions: Per After Visit Summary. Activity: Advance as tolerated. Pelvic rest for 6 weeks.  Also refer to After  Visit Summary.  Wound care discussed. Diet: Regular Medications: Allergies as of 12/25/2017      Reactions   Aloe Hives   Lubricants    Has to have water based lubricant. Allergy to non-water based lubricant   Sulfa Antibiotics Itching, Rash   Tape Itching, Rash      Medication List    STOP taking these medications   ABDOMINAL SUPPORT/L-XL Misc   aspirin EC 81 MG tablet   butalbital-acetaminophen-caffeine 50-325-40-30 MG capsule Commonly known as:  FIORICET WITH CODEINE   hydrOXYzine 25 MG tablet Commonly known as:  ATARAX/VISTARIL   labetalol 100 MG tablet Commonly known as:  NORMODYNE   Magnesium 200 MG Tabs   metoCLOPramide 10 MG tablet Commonly known as:  REGLAN     TAKE these medications   ibuprofen 600 MG tablet Commonly known as:  ADVIL,MOTRIN Take 1 tablet (600 mg total) by mouth every 6 (six) hours.   methyldopa 500 MG tablet Commonly known as:  ALDOMET Take 1 tablet (500 mg total) by mouth 3 (three) times daily.   oxyCODONE-acetaminophen 5-325 MG tablet Commonly known as:  PERCOCET/ROXICET Take 1 tablet by mouth every 4 (four) hours as needed (for pain scale greater than or equal to 4 and less than 7).   prenatal multivitamin Tabs tablet Take 1 tablet by  mouth daily at 12 noon.      [facility] pressure and incision check with Sydney BurkeAnnie Aaryanna James, CNM  Postpartum contraception:Unsure at this time. Will discuss at first post-partum visit.  Discharged Condition: good  Discharged to: home  Newborn Data: Disposition:NICU  Apgars: APGAR (1 MIN): 8   APGAR (5 MINS): 8   APGAR (10 MINS):    Baby Feeding: pumping  Sydney James, CNM  12/25/2017 8:06 AM

## 2017-12-25 NOTE — Lactation Note (Signed)
This note was copied from a baby's chart. Lactation Consultation Note  Patient Name: Sydney Joella PrinceMichaela Ernster WUJWJ'XToday's Date: 12/25/2017     Maternal Data    Feeding Feeding Type: Breast Milk  LATCH Score                   Interventions    Lactation Tools Discussed/Used     Consult Status      Sydney James, Sydney James 12/25/2017, 9:39 PM

## 2017-12-25 NOTE — Progress Notes (Signed)
Discharged with husband to go home. Infant in special care nursery. Taken to visitor entrance via wheel chair by hospital volunteer to go home.

## 2017-12-25 NOTE — Progress Notes (Signed)
Provided and reviewed discharge paperwork and prescriptions. Verified understanding by use of teach back method, pt also verbalized understanding. Thorough instruction provided to patient regarding reporting increased blood pressure, change in vision, RUQ pain, increased swelling or headache. Follow up appointment provided. Patient ready for discharge home once her husband is here to transport her home. Infant to stay in special care nursery. Will continue to monitor.

## 2017-12-25 NOTE — Lactation Note (Addendum)
This note was copied from a baby's chart. Lactation Consultation Note  Patient Name: Boy Joella PrinceMichaela Howatt Today's Date: 12/25/2017   Assisted mom with comfortable position with pillow support in cradle hold skin to skin for lick and learn session.  Mom had just pump, expressed 15 ml and had Luca skin to skin.  Eased him to the left breast.  He licked out his tongue while LC hand expressed.  Once he tasted the transitional breast milk, he opened his mouth wide with flanged lips, latched and took several rhythmic strong tugs at the breast which mom reports feeling.  Mom has slightly flat nipples with invagination which presented some challenges for him to maintain the latch for long periods.  Did not want to try nipple shield for this session since we were just trying a little lick and learn for the first time at the breast, but may be required for later breast feedings.  He kept latching taking a couple of sucks and then coming off for several attempts.  Once the tube feeding was started, he started falling asleep and no longer was rooting so mom just left him skin to skin for the remainder of the tube feeding.  Assisted father of baby with process of getting DEBP through [employer].  Loaned Symphony pump through [facility] for 1 week or until can get DEBP through [employer].  Praised parents for commitment to continue to work at supplying breast milk for [place].  Maternal Data    Feeding Feeding Type: Breast Milk  LATCH Score                   Interventions    Lactation Tools Discussed/Used     Consult Status      Louis MeckelWilliams, Jaquetta Currier Kay 12/25/2017, 9:12 PM

## 2017-12-25 NOTE — Final Progress Note (Signed)
Discharge Day SOAP Note:  Subjective:  The patient has no complaints.  She is ambulating well. She is taking PO well. Pain is well controlled with current medications. Patient is urinating without difficulty.   She is passing flatus.   Objective  Vital signs in last 24 hours: BP 140/84   Pulse 83   Temp 97.9 F (36.6 C) (Oral)   Resp 20   Ht 5\' 7"  (1.702 m)   Wt 103.9 kg   LMP 04/28/2017   SpO2 98%   Breastfeeding Unknown   BMI 35.87 kg/m   Physical Exam: Gen: NAD Abdomen:  clean, dry, no drainage, healing Fundus Fundal Tone: Firm  Lochia Amount: Small     Data Review Labs: CBC Latest Ref Rng & Units 12/23/2017 12/22/2017 12/20/2017  WBC 4.0 - 10.5 K/uL 13.1(H) 12.8(H) 13.6(H)  Hemoglobin 12.0 - 15.0 g/dL 9.6(E9.3(L) 10.2(L) 10.2(L)  Hematocrit 36.0 - 46.0 % 29.5(L) 31.2(L) 31.6(L)  Platelets 150 - 400 K/uL 221 246 259   O POS  Assessment:  Active Problems:   Gestational hypertension   Preterm labor   Doing well.  Normal progress as expected.     Plan:  Discharge to home  Modified rest as directed - may slowly resume normal activities with restrictions  as discussed.  Medications as written.  See below for additional.       Discharge Instructions: Per After Visit Summary. Activity: Advance as tolerated. Pelvic rest for 6 weeks.  Also refer to After Visit Summary.  Wound care discussed. Diet: Regular Medications: Allergies as of 12/25/2017      Reactions   Aloe Hives   Lubricants    Has to have water based lubricant. Allergy to non-water based lubricant   Sulfa Antibiotics Itching, Rash   Tape Itching, Rash      Medication List    STOP taking these medications   ABDOMINAL SUPPORT/L-XL Misc   aspirin EC 81 MG tablet   butalbital-acetaminophen-caffeine 50-325-40-30 MG capsule Commonly known as:  FIORICET WITH CODEINE   hydrOXYzine 25 MG tablet Commonly known as:  ATARAX/VISTARIL   labetalol 100 MG tablet Commonly known as:  NORMODYNE    Magnesium 200 MG Tabs   metoCLOPramide 10 MG tablet Commonly known as:  REGLAN     TAKE these medications   ibuprofen 600 MG tablet Commonly known as:  ADVIL,MOTRIN Take 1 tablet (600 mg total) by mouth every 6 (six) hours.   methyldopa 500 MG tablet Commonly known as:  ALDOMET Take 1 tablet (500 mg total) by mouth 3 (three) times daily.   oxyCODONE-acetaminophen 5-325 MG tablet Commonly known as:  PERCOCET/ROXICET Take 1 tablet by mouth every 4 (four) hours as needed (for pain scale greater than or equal to 4 and less than 7).   prenatal multivitamin Tabs tablet Take 1 tablet by mouth daily at 12 noon.      [facility]: [facility] and incision check with Doreene BurkeAnnie Sophiarose Eades, CNM  Postpartum contraception:Unsure at this time. Will discuss at first post-partum visit.  Discharged Condition: good  Discharged to: home  Newborn Data: Disposition:NICU  Apgars: APGAR (1 MIN): 8   APGAR (5 MINS): 8   APGAR (10 MINS):    Baby Feeding: pumping  Doreene Burkennie Raquelle Pietro, CNM  12/25/2017 8:06 AM

## 2017-12-26 ENCOUNTER — Other Ambulatory Visit

## 2017-12-26 ENCOUNTER — Ambulatory Visit: Payer: Self-pay

## 2017-12-26 ENCOUNTER — Encounter: Admitting: Obstetrics and Gynecology

## 2017-12-26 NOTE — Lactation Note (Signed)
This note was copied from a baby's chart. Lactation Consultation Note  Patient Name: Sydney James Today's Date: 12/26/2017     Maternal Data    Feeding Feeding Type: Donor Breast Milk  LATCH Score                   Interventions    Lactation Tools Discussed/Used Tools: 45F feeding tube / Syringe;Pump   Consult Status  LC suggested nipple shells for MOB due to flat nipples. MOB states that her nipples come out with reverse pressure and pumping before feedings. MOB is in the process of getting personal DEBP thru insurance provider, but is using a hospital loan right now. LC will got back to check on MOB with SLP at noon touch time.     Burnadette PeterJaniya M Prestina Raigoza 12/26/2017, 10:43 AM

## 2017-12-29 ENCOUNTER — Ambulatory Visit (INDEPENDENT_AMBULATORY_CARE_PROVIDER_SITE_OTHER): Admitting: Certified Nurse Midwife

## 2017-12-29 ENCOUNTER — Ambulatory Visit: Payer: Self-pay

## 2017-12-29 ENCOUNTER — Encounter: Payer: Self-pay | Admitting: Certified Nurse Midwife

## 2017-12-29 VITALS — BP 137/105 | HR 74 | Ht 67.0 in | Wt 217.8 lb

## 2017-12-29 DIAGNOSIS — Z4889 Encounter for other specified surgical aftercare: Secondary | ICD-10-CM | POA: Diagnosis not present

## 2017-12-29 MED ORDER — LABETALOL HCL 100 MG PO TABS: 100.0000 mg | ORAL_TABLET | Freq: Three times a day (TID) | ORAL | 5 refills | Status: DC

## 2017-12-29 NOTE — Lactation Note (Signed)
This note was copied from a baby's chart. Lactation Consultation Note  Patient Name: Sydney Joella PrinceMichaela Frein AVWUJ'WToday's Date: 12/29/2017  Parents are trying to decide which breast pump to purchase, given information of pumps that we sell, advised them on what features to look for, they are using our rental pump until they receive their pump, mom states she is pumping sufficient quantities of milk, attempted breastfeeding baby today, baby tires easily but was able to nurse a few min at breast   Maternal Data    Feeding Feeding Type: Breast Milk  LATCH Score                   Interventions    Lactation Tools Discussed/Used     Consult Status      Sydney James 12/29/2017, 6:22 PM

## 2017-12-29 NOTE — Progress Notes (Signed)
    OBSTETRICS/GYNECOLOGY POST-OPERATIVE CLINIC VISIT  Subjective:     Sydney James is a 22 y.o. female who presents to the clinic 1 weeks status post primary cesarean section for non reasuring fetal heart tones. Eating a regular diet without difficulty. Bowel movements are normal. Pain is controlled with current analgesics. Medications being used: ibuprofen (OTC).  The following portions of the patient's history were reviewed and updated as appropriate: allergies, current medications, past family history, past medical history, past social history, past surgical history and problem list.  Review of Systems Pertinent items are noted in HPI.    Objective:    There were no vitals taken for this visit. General:  alert and no distress  Abdomen: soft, bowel sounds active, non-tender  Incision:   healing well, no drainage, no erythema, no hernia, no seroma, no swelling, no dehiscence, incision well approximated    Pathology:    Assessment:    Doing well postoperatively.   Plan:   1. Continue any current medications.Add labetalol 100 mg TID ( consulted with Dr. Valentino Saxonherry) 2. Wound care discussed. 3. Operative findings again reviewed.  4. Activity restrictions: continue current restrictions 5. Anticipated return to work: 6 weeks. 6. Follow up: 3 days for BP check     Doreene Burkehompson, Jerek Meulemans, CNM [facility]

## 2017-12-29 NOTE — Patient Instructions (Signed)
Hipertensin  Hypertension  Introduccin  El trmino hipertensin es otra forma de denominar a la presin arterial elevada. La presin arterial elevada fuerza al corazn a trabajar ms para bombear la sangre. Esto puede causar problemas con el paso del tiempo.  Una lectura de presin arterial est compuesta por 2 nmeros. Hay un nmero superior (sistlico) sobre un nmero inferior (diastlico). Lo ideal es tener la presin arterial por debajo de 120/80. Las decisiones saludables pueden ayudarle a disminuir su presin arterial. Es posible que necesite medicamentos que le ayuden a disminuir su presin arterial si:   Su presin arterial no disminuye mediante decisiones saludables.   Su presin arterial est por encima de 130/80.  Siga estas instrucciones en su casa:  Comida y bebida     Si se lo indican, siga el plan de alimentacin de DASH (Dietary Approaches to Stop Hypertension, Maneras de alimentarse para detener la hipertensin). Esta dieta incluye:  ? Que la mitad del plato de cada comida sea de frutas y verduras.  ? Que un cuarto del plato de cada comida sea de cereales integrales. Los cereales integrales incluyen pasta integral, arroz integral y pan integral.  ? Comer y beber productos lcteos con bajo contenido de grasa, como leche descremada o yogur bajo en grasas.  ? Que un cuarto del plato de cada comida sea de protenas bajas en grasa (magras). Las protenas bajas en grasa incluyen pescado, pollo sin piel, huevos, frijoles y tofu.  ? Evitar consumir carne grasa, carne curada y procesada, o pollo con piel.  ? Evitar consumir alimentos prehechos o procesados.   Consuma menos de 1500 mg de sal (sodio) por da.   Limite el consumo de alcohol a no ms de 1 medida por da si es mujer y no est embarazada y a 2 medidas por da si es hombre. Una medida equivale a 12onzas de cerveza, 5onzas de vino o 1onzas de bebidas alcohlicas de alta graduacin.  Estilo de vida   Trabaje con su mdico para mantenerse  en un peso saludable o para perder peso. Pregntele a su mdico cul es el peso recomendable para usted.   Realice al menos 30 minutos de ejercicio que haga que se acelere su corazn (ejercicio aerbico) la mayora de los das de la semana. Estos pueden incluir caminar, nadar o andar en bicicleta.   Realice al menos 30 minutos de ejercicio que fortalezca sus msculos (ejercicios de resistencia) al menos 3 das a la semana. Estos pueden incluir levantar pesas o hacer pilates.   No consuma ningn producto que contenga nicotina o tabaco. Esto incluye cigarrillos y cigarrillos electrnicos. Si necesita ayuda para dejar de fumar, consulte al mdico.   Controle su presin arterial en su casa tal como le indic el mdico.   Concurra a todas las visitas de control como se lo haya indicado el mdico. Esto es importante.  Medicamentos   Tome los medicamentos de venta libre y los recetados solamente como se lo haya indicado el mdico. Siga cuidadosamente las indicaciones.   No omita las dosis de medicamentos para la presin arterial. Los medicamentos pierden eficacia si omite dosis. El hecho de omitir las dosis tambin aumenta el riesgo de otros problemas.   Pregntele a su mdico a qu efectos secundarios o reacciones a los medicamentos debe prestar atencin.  Comunquese con un mdico si:   Piensa que tiene una reaccin a los medicamentos que est tomando.   Tiene dolores de cabeza frecuentes (recurrentes).   Siente mareos.     Tiene hinchazn en los tobillos.   Tiene problemas de visin.  Solicite ayuda de inmediato si:   Siente un dolor de cabeza muy intenso.   Comienza a sentirse confundido.   Se siente dbil o adormecido.   Siente que va a desmayarse.   Siente un dolor muy intenso en:  ? El pecho.  ? El vientre (abdomen).   Devuelve (vomita) ms de una vez.   Tiene dificultad para respirar.  Resumen   El trmino hipertensin es otra forma de denominar a la presin arterial elevada.   Las decisiones  saludables pueden ayudarle a disminuir su presin arterial. Si no puede controlar su presin arterial mediante decisiones saludables, es posible que deba tomar medicamentos.  Esta informacin no tiene como fin reemplazar el consejo del mdico. Asegrese de hacerle al mdico cualquier pregunta que tenga.  Document Released: 06/09/2009 Document Revised: 12/02/2015 Document Reviewed: 12/02/2015  Elsevier Interactive Patient Education  2019 Elsevier Inc[employer]

## 2018-01-01 ENCOUNTER — Encounter: Payer: Self-pay | Admitting: Certified Nurse Midwife

## 2018-01-01 ENCOUNTER — Ambulatory Visit (INDEPENDENT_AMBULATORY_CARE_PROVIDER_SITE_OTHER): Admitting: Certified Nurse Midwife

## 2018-01-01 VITALS — BP 127/90

## 2018-01-01 DIAGNOSIS — Z0130 Encounter for examination of blood pressure without abnormal findings: Secondary | ICD-10-CM | POA: Diagnosis not present

## 2018-01-01 NOTE — Patient Instructions (Signed)
Hypertension During Pregnancy ° °Hypertension is also called high blood pressure. High blood pressure means that the force of your blood moving in your body is too strong. When you are pregnant, this condition should be watched carefully. It can cause problems for you and your baby. °Follow these instructions at home: °Eating and drinking ° °· Drink enough fluid to keep your pee (urine) pale yellow. °· Avoid caffeine. °Lifestyle °· Do not use any products that contain nicotine or tobacco, such as cigarettes and e-cigarettes. If you need help quitting, ask your doctor. °· Do not use alcohol or drugs. °· Avoid stress. °· Rest and get plenty of sleep. °General instructions °· Take over-the-counter and prescription medicines only as told by your doctor. °· While lying down, lie on your left side. This keeps pressure off your major blood vessels. °· While sitting or lying down, raise (elevate) your feet. Try putting some pillows under your lower legs. °· Exercise regularly. Ask your doctor what kinds of exercise are best for you. °· Keep all prenatal and follow-up visits as told by your doctor. This is important. °Contact a doctor if: °· You have symptoms that your doctor told you to watch for, such as: °? Throwing up (vomiting). °? Feeling sick to your stomach (nausea). °? Headache. °Get help right away if you have: °· Very bad belly pain that does not get better with treatment. °· A very bad headache that does not get better. °· Throwing up that does not get better with treatment. °· Sudden, fast weight gain. °· Sudden swelling in your hands, ankles, or face. °· Bleeding from your vagina. °· Blood in your pee. °· Fewer movements from your baby than usual. °· Blurry vision. °· Double vision. °· Muscle twitching. °· Sudden muscle tightening (spasms). °· Trouble breathing. °· Blue fingernails or lips. °Summary °· Hypertension is also called high blood pressure. High blood pressure means that the force of your blood moving  in your body is too strong. °· When you are pregnant, this condition should be watched carefully. It can cause problems for you and your baby. °· Get help right away if you have symptoms that your doctor told you to watch for. °This information is not intended to replace advice given to you by your health care provider. Make sure you discuss any questions you have with your health care provider. °Document Released: 01/22/2010 Document Revised: 12/06/2016 Document Reviewed: 09/01/2015 °Elsevier Interactive Patient Education © 2019 Elsevier Inc. ° °

## 2018-01-01 NOTE — Progress Notes (Signed)
Pt present for BP check due to medication change. BP today 127/90. She states that yesterday morning she was feeling a little light headed. She took her bp it was 98/80 so she skipped her morning dose and did fine the rest of the day. Pt instructed to continue medication as ordered if she has another episode with low BP that to let us know and we could decrease the aldomet and labetalol to BID. She verbalizes and agrees to plan . She will follow up as needed or in 6 wks.   Doreene BurkeAnnie Conrado Nance, CNM

## 2018-01-01 NOTE — Progress Notes (Signed)
Patient is here for a NV for a BP check. Denies swelling or headache. States she is taking increased dosage or BP medication given by AT.

## 2018-01-02 ENCOUNTER — Encounter: Admitting: Obstetrics and Gynecology

## 2018-01-02 ENCOUNTER — Other Ambulatory Visit

## 2018-01-04 ENCOUNTER — Ambulatory Visit: Payer: Self-pay

## 2018-01-04 NOTE — Lactation Note (Signed)
This note was copied from a baby's chart. Lactation Consultation Note  Patient Name: Sydney James Today's Date: 01/04/2018   Mom has ordered Medela DEBP through [place], but has not arrived yet.  Agreed to extend one more week through [facility], but if not here by then would have to start charging.  Mom is pumping large amounts of mature milk.  Praised mom for her commitment to continue to supply breast milk for her son in [place].  Encouraged to call Lactation with any questions, concerns or assistance.  Maternal Data    Feeding Feeding Type: Bottle Fed - Breast Milk Nipple Type: Slow - flow  LATCH Score                   Interventions    Lactation Tools Discussed/Used     Consult Status      Louis Meckel 01/04/2018, 6:33 PM

## 2018-01-08 ENCOUNTER — Telehealth: Payer: Self-pay | Admitting: Certified Nurse Midwife

## 2018-01-08 NOTE — Telephone Encounter (Signed)
Sydney MaduroRobert came by and asked for documentation giving specifics on Sydney James's limitations due to her recent C-section delivery as he is trying to get a little bit longer leave to help her as he is in [employer] and if he can pick up a letter tomorrow, Tuesday, 01/09/2018, that would be perfect.  Also, please call the patient's cell if you would like to ask questions, etc. as he will be with her, please advise, thanks.

## 2018-01-09 ENCOUNTER — Ambulatory Visit: Payer: Self-pay

## 2018-01-09 NOTE — Lactation Note (Signed)
This note was copied from a baby's chart. 1315Lactation Consultation Note  Patient Name: Boy Lizelle Felch Today's Date: 01/09/2018     Maternal Data  Mom concerned she has overabundant milk supply, baby is 2 wks old born at 64 wks, states she is full and uncomfortable at 2 - 3 hrs, sometimes 1 hr after pumping, using cabbage leaves in between pumping, ibuprofen for pain, pumps x 30 min at each session, obtains approx 6 oz each session, I reassured her that she is early in the process and that engorgement usually decreases by 1 mth or so, her body is still trying to determine how much it needs to make, try decreasing length of pumping session at least five min, may use ice packs or cool pads for pain, continue with ibuprofen and may alternate with acetomeniphen, try to extend length of time in between pumps to 3 hrs if possible, continue to follow with lactation so we can assess her progress to prevent mastitis and plugged ducts, currently her breasts are soft to palpation.   Feeding Feeding Type: Breast Milk Nipple Type: Slow - flow  LATCH Score                   Interventions    Lactation Tools Discussed/Used     Consult Status      Dyann Kief 01/09/2018, 4:28 PM

## 2018-01-19 ENCOUNTER — Ambulatory Visit (INDEPENDENT_AMBULATORY_CARE_PROVIDER_SITE_OTHER): Admitting: Obstetrics and Gynecology

## 2018-01-19 ENCOUNTER — Encounter: Payer: Self-pay | Admitting: Obstetrics and Gynecology

## 2018-01-19 VITALS — BP 134/97 | HR 97 | Ht 67.0 in | Wt 204.4 lb

## 2018-01-19 DIAGNOSIS — N926 Irregular menstruation, unspecified: Secondary | ICD-10-CM | POA: Diagnosis not present

## 2018-01-19 NOTE — Progress Notes (Signed)
  Subjective:     Patient ID: Sydney James, female   DOB: 03/11/1995, 23 y.o.   MRN: [id number]  HPI Here due to blood in urine yesterday but none since. Does have h/o UTIs and wanted to have it checked. Is currently breastfeeding infant and hasn't had menses yet. Denies fever or dysuria. Denies and vaginal irritation or discharge.  Review of Systems  All other systems reviewed and are negative.      Objective:   Physical Exam  A&Ox4 Well groomed female in no distress Blood pressure (!) 134/97, pulse 97, height 5\' 7"  (1.702 m), weight 204 lb 6.4 oz (92.7 kg), unknown if currently breastfeeding. UA negative Pelvic exam: normal external genitalia, vulva, vagina, cervix, uterus and adnexa, CERVIX: cervical discharge present - bloody.     Assessment:     Onset menses    Plan:      reassured of normal findings. RTC at next scheduled appointment.  Sydney James,CNM

## 2018-02-05 ENCOUNTER — Ambulatory Visit (INDEPENDENT_AMBULATORY_CARE_PROVIDER_SITE_OTHER): Admitting: Certified Nurse Midwife

## 2018-02-05 ENCOUNTER — Encounter: Payer: Self-pay | Admitting: Certified Nurse Midwife

## 2018-02-05 ENCOUNTER — Encounter: Admitting: Certified Nurse Midwife

## 2018-02-05 MED ORDER — DROSPIRENONE 4 MG PO TABS: 1.0000 | ORAL_TABLET | Freq: Every day | ORAL | 3 refills | Status: DC

## 2018-02-05 NOTE — Patient Instructions (Signed)
Preventive Care 18-39 Years, Female Preventive care refers to lifestyle choices and visits with your health care provider that can promote health and wellness. What does preventive care include?   A yearly physical exam. This is also called an annual well check.  Dental exams once or twice a year.  Routine eye exams. Ask your health care provider how often you should have your eyes checked.  Personal lifestyle choices, including: ? Daily care of your teeth and gums. ? Regular physical activity. ? Eating a healthy diet. ? Avoiding tobacco and drug use. ? Limiting alcohol use. ? Practicing safe sex. ? Taking vitamin and mineral supplements as recommended by your health care provider. What happens during an annual well check? The services and screenings done by your health care provider during your annual well check will depend on your age, overall health, lifestyle risk factors, and family history of disease. Counseling Your health care provider may ask you questions about your:  Alcohol use.  Tobacco use.  Drug use.  Emotional well-being.  Home and relationship well-being.  Sexual activity.  Eating habits.  Work and work environment.  Method of birth control.  Menstrual cycle.  Pregnancy history. Screening You may have the following tests or measurements:  Height, weight, and BMI.  Diabetes screening. This is done by checking your blood sugar (glucose) after you have not eaten for a while (fasting).  Blood pressure.  Lipid and cholesterol levels. These may be checked every 5 years starting at age 20.  Skin check.  Hepatitis C blood test.  Hepatitis B blood test.  Sexually transmitted disease (STD) testing.  BRCA-related cancer screening. This may be done if you have a family history of breast, ovarian, tubal, or peritoneal cancers.  Pelvic exam and Pap test. This may be done every 3 years starting at age 21. Starting at age 30, this may be done every 5  years if you have a Pap test in combination with an HPV test. Discuss your test results, treatment options, and if necessary, the need for more tests with your health care provider. Vaccines Your health care provider may recommend certain vaccines, such as:  Influenza vaccine. This is recommended every year.  Tetanus, diphtheria, and acellular pertussis (Tdap, Td) vaccine. You may need a Td booster every 10 years.  Varicella vaccine. You may need this if you have not been vaccinated.  HPV vaccine. If you are 26 or younger, you may need three doses over 6 months.  Measles, mumps, and rubella (MMR) vaccine. You may need at least one dose of MMR. You may also need a second dose.  Pneumococcal 13-valent conjugate (PCV13) vaccine. You may need this if you have certain conditions and were not previously vaccinated.  Pneumococcal polysaccharide (PPSV23) vaccine. You may need one or two doses if you smoke cigarettes or if you have certain conditions.  Meningococcal vaccine. One dose is recommended if you are age 19-21 years and a first-year college student living in a residence hall, or if you have one of several medical conditions. You may also need additional booster doses.  Hepatitis A vaccine. You may need this if you have certain conditions or if you travel or work in places where you may be exposed to hepatitis A.  Hepatitis B vaccine. You may need this if you have certain conditions or if you travel or work in places where you may be exposed to hepatitis B.  Haemophilus influenzae type b (Hib) vaccine. You may need this if you   have certain risk factors. Talk to your health care provider about which screenings and vaccines you need and how often you need them. This information is not intended to replace advice given to you by your health care provider. Make sure you discuss any questions you have with your health care provider. Document Released: 02/15/2001 Document Revised: 08/02/2016  Document Reviewed: 10/21/2014 Elsevier Interactive Patient Education  2019 [employer].

## 2018-02-05 NOTE — Progress Notes (Signed)
Subjective:    Sydney James is a 23 y.o. [id number] Caucasian female who presents for a postpartum visit. She is 6 weeks postpartum following a primary cesarean section, low transverse incision at 34 gestational weeks. Anesthesia: epidural. I have fully reviewed the prenatal and intrapartum course. Postpartum course has been complicated with hypertension on medications for management. She has stopped all medications . Baby's course has been WNL for Preterm baby. Baby is feeding by bottle. Bleeding no bleeding. Bowel function is normal. Bladder function is normal. Patient is sexually active.. Contraception method is condoms. She would like to try birth control. Has history of migraine with aura. Recommend POP.  Postpartum depression screening: Score 0.  Last pap and was 07/19/17 negative.  The following portions of the patient's history were reviewed and updated as appropriate: allergies, current medications, past medical history, past surgical history and problem list.  Review of Systems Pertinent items are noted in HPI.   Vitals:   02/05/18 1114  BP: (!) 124/92  Pulse: 84  Weight: 206 lb 6 oz (93.6 kg)  Height: 5\' 7"  (1.702 m)   No LMP recorded. (Menstrual status: Lactating).  Objective:   General:  alert, cooperative and no distress   Breasts:  deferred, no complaints  Lungs: clear to auscultation bilaterally  Heart:  regular rate and rhythm  Abdomen: soft, nontender   Vulva: normal  Vagina: normal vagina  Cervix:  closed  Corpus: Well-involuted  Adnexa:  Non-palpable  Rectal Exam: No hemorrhoids        Assessment:   Postpartum exam 6 wks s/p primary cesarean section for fetal intolerance of labor, PIH Bottlefeeding Depression screening Contraception counseling   Plan:  : starting on POP , Slynd sample pack given with savings card.  Follow up in: 6-12 months  for annual exam or earlier if needed  Doreene BurkeAnnie Joel Mericle, CNM

## 2018-04-06 ENCOUNTER — Encounter: Admitting: Certified Nurse Midwife

## 2018-10-16 ENCOUNTER — Other Ambulatory Visit: Payer: Self-pay

## 2018-10-16 ENCOUNTER — Emergency Department

## 2018-10-16 ENCOUNTER — Emergency Department
Admission: EM | Admit: 2018-10-16 | Discharge: 2018-10-16 | Disposition: A | Discharge status: Home / Self Care | Attending: Emergency Medicine | Admitting: Emergency Medicine

## 2018-10-16 DIAGNOSIS — N83209 Unspecified ovarian cyst, unspecified side: Secondary | ICD-10-CM

## 2018-10-16 DIAGNOSIS — R102 Pelvic and perineal pain: Secondary | ICD-10-CM

## 2018-10-16 DIAGNOSIS — M8538 Osteitis condensans, other site: Secondary | ICD-10-CM | POA: Insufficient documentation

## 2018-10-16 DIAGNOSIS — N83291 Other ovarian cyst, right side: Secondary | ICD-10-CM | POA: Insufficient documentation

## 2018-10-16 DIAGNOSIS — N83201 Unspecified ovarian cyst, right side: Secondary | ICD-10-CM

## 2018-10-16 DIAGNOSIS — Z79899 Other long term (current) drug therapy: Secondary | ICD-10-CM | POA: Diagnosis not present

## 2018-10-16 DIAGNOSIS — R1031 Right lower quadrant pain: Secondary | ICD-10-CM | POA: Diagnosis present

## 2018-10-16 LAB — COMPREHENSIVE METABOLIC PANEL
ALT: 22 U/L (ref 0–44)
AST: 18 U/L (ref 15–41)
Albumin: 4 g/dL (ref 3.5–5.0)
Alkaline Phosphatase: 75 U/L (ref 38–126)
Anion gap: 11 (ref 5–15)
BUN: 15 mg/dL (ref 6–20)
CO2: 23 mmol/L (ref 22–32)
Calcium: 9.2 mg/dL (ref 8.9–10.3)
Chloride: 102 mmol/L (ref 98–111)
Creatinine, Ser: 0.75 mg/dL (ref 0.44–1.00)
GFR calc Af Amer: >60 mL/min (ref >60)
GFR calc non Af Amer: >60 mL/min (ref >60)
Glucose, Bld: 103 mg/dL — ABNORMAL HIGH (ref 70–99)
Potassium: 3.7 mmol/L (ref 3.5–5.1)
Sodium: 136 mmol/L (ref 135–145)
Total Bilirubin: 0.5 mg/dL (ref 0.3–1.2)
Total Protein: 7.5 g/dL (ref 6.5–8.1)

## 2018-10-16 LAB — URINALYSIS, COMPLETE (UACMP) WITH MICROSCOPIC
Bacteria, UA: NONE SEEN
Bilirubin Urine: NEGATIVE
Glucose, UA: NEGATIVE mg/dL
Hgb urine dipstick: NEGATIVE
Ketones, ur: NEGATIVE mg/dL
Leukocytes,Ua: NEGATIVE
Nitrite: NEGATIVE
Protein, ur: NEGATIVE mg/dL
Specific Gravity, Urine: 1.019 (ref 1.005–1.030)
pH: 6 (ref 5.0–8.0)

## 2018-10-16 LAB — POCT PREGNANCY, URINE: Preg Test, Ur: NEGATIVE

## 2018-10-16 LAB — CBC
HCT: 41.1 % (ref 36.0–46.0)
Hemoglobin: 13.1 g/dL (ref 12.0–15.0)
MCH: 26.1 pg (ref 26.0–34.0)
MCHC: 31.9 g/dL (ref 30.0–36.0)
MCV: 82 fL (ref 80.0–100.0)
Platelets: 344 10*3/uL (ref 150–400)
RBC: 5.01 MIL/uL (ref 3.87–5.11)
RDW: 12.9 % (ref 11.5–15.5)
WBC: 13.7 10*3/uL — ABNORMAL HIGH (ref 4.0–10.5)
nRBC: 0 % (ref 0.0–0.2)

## 2018-10-16 LAB — LIPASE, BLOOD: Lipase: 23 U/L (ref 11–51)

## 2018-10-16 MED ORDER — KETOROLAC TROMETHAMINE 30 MG/ML IJ SOLN
30.0000 mg | Freq: Once | INTRAMUSCULAR | Status: AC
  Administered 2018-10-16: 30 mg via INTRAVENOUS
  Filled 2018-10-16: qty 1

## 2018-10-16 MED ORDER — SODIUM CHLORIDE 0.9% FLUSH: 3.0000 mL | Freq: Once | INTRAVENOUS | Status: DC

## 2018-10-16 MED ORDER — NAPROXEN 500 MG PO TABS: 500.0000 mg | ORAL_TABLET | Freq: Two times a day (BID) | ORAL | 2 refills | Status: DC

## 2018-10-16 MED ORDER — ONDANSETRON HCL 4 MG/2ML IJ SOLN
4.0000 mg | Freq: Once | INTRAMUSCULAR | Status: AC
  Administered 2018-10-16: 4 mg via INTRAVENOUS
  Filled 2018-10-16: qty 2

## 2018-10-16 NOTE — ED Notes (Signed)
ED Provider at bedside. 

## 2018-10-16 NOTE — ED Notes (Signed)
Pt alert and oriented X 4, stable for discharge. RR even and unlabored, color WNL. Discussed discharge instructions and follow up when appropriate. Instructed to follow up with ER for any life threatening symptoms or concerns that patient or family of patient may have  

## 2018-10-16 NOTE — ED Notes (Signed)
Patient transported to CT 

## 2018-10-16 NOTE — ED Provider Notes (Signed)
[facility] Emergency Department Provider Note   ____________________________________________    I have reviewed the triage vital signs and the nursing notes.   HISTORY  Chief Complaint Other and Abdominal Pain     HPI Sydney James is a 23 y.o. female who presents with complaints of abdominal pain.  Patient reports abrupt onset of right lower quadrant abdominal pain while she was buckling her child in his seat after picking him up from school.  This started approximately 1 hour prior to arrival.  She has never had pain like this.  No history of kidney stones.  No history of ovarian cysts.  Has not take anything for this.  No vaginal discharge, no vaginal bleeding.  No dysuria.  No fevers or chills.  Some nausea.  Past Medical History:  Diagnosis Date  . Lumbar herniated disc   . Migraine   . Preeclampsia   . PVC's (premature ventricular contractions)   . Raynaud's disease   . Scoliosis    Back brace for 2 years  . Scoliosis     Patient Active Problem List   Diagnosis Date Noted  . Preterm labor 12/19/2017  . Gestational hypertension 11/28/2017  . Abnormal glucose level 11/16/2017  . History of pre-eclampsia 08/17/2017  . History of preterm delivery 08/17/2017  . Vitamin D deficiency 11/02/2016    Past Surgical History:  Procedure Laterality Date  . CESAREAN SECTION N/A 12/22/2017   Procedure: CESAREAN SECTION;  Surgeon: Linzie Collin, MD;  Location: [facility] ORS;  Service: Obstetrics;  Laterality: N/A;  Female Born @ 32    . DILATION AND CURETTAGE OF UTERUS    . DILATION AND EVACUATION N/A 03/15/2016   Procedure: DILATATION AND EVACUATION;  Surgeon: Herold Harms, MD;  Location: [facility] ORS;  Service: Gynecology;  Laterality: N/A;  . WISDOM TOOTH EXTRACTION      Prior to Admission medications   Medication Sig Start Date End Date Taking? Authorizing Provider  Drospirenone (SLYND) 4 MG TABS Take 1 tablet by mouth daily. 02/05/18    Doreene Burke, CNM  Multiple Vitamin (MULTIVITAMIN) capsule Take 1 capsule by mouth daily.    [provider]  naproxen (NAPROSYN) 500 MG tablet Take 1 tablet (500 mg total) by mouth 2 (two) times daily with a meal. 10/16/18   Jene Every, MD     Allergies Aloe, Lubricants, Sulfa antibiotics, and Tape  Family History  Problem Relation Age of Onset  . Hypertension Father   . Hypertension Mother   . Cancer Maternal Grandmother   . Cancer Maternal Grandfather   . Diabetes Maternal Grandfather   . Cancer Paternal Grandmother   . Diabetes Paternal Grandfather     Social History Social History   Tobacco Use  . Smoking status: Never Smoker  . Smokeless tobacco: Never Used  Substance Use Topics  . Alcohol use: No  . Drug use: No    Review of Systems  Constitutional: No fever/chills Eyes: No visual changes.  ENT: No sore throat. Cardiovascular: Denies chest pain. Respiratory: Denies shortness of breath. Gastrointestinal: As above Genitourinary: As above Musculoskeletal: Negative for back pain. Skin: Negative for rash. Neurological: Negative for headaches or weakness   ____________________________________________   PHYSICAL EXAM:  VITAL SIGNS: ED Triage Vitals [10/16/18 1251]  Enc Vitals Group     BP (!) 168/116     Pulse Rate (!) 120     Resp 19     Temp 97.8 F (36.6 C)  Temp src      SpO2 100 %     Weight 96.6 kg (213 lb)     Height 1.702 m (5\' 7" )     Head Circumference      Peak Flow      Pain Score 7     Pain Loc      Pain Edu?      Excl. in [place]?     Constitutional: Alert and oriented.   Nose: No congestion/rhinnorhea. Mouth/Throat: Mucous membranes are moist.    Cardiovascular: Normal rate, regular rhythm. Grossly normal heart sounds.  Good peripheral circulation. Respiratory: Normal respiratory effort.  No retractions. Lungs CTAB. Gastrointestinal: Mild tenderness palpation right lower quadrant, no distention, no swelling, no  CVA tenderness. Genitourinary: deferred Musculoskeletal:  Warm and well perfused Neurologic:  Normal speech and language. No gross focal neurologic deficits are appreciated.  Skin:  Skin is warm, dry and intact. No rash noted.   ____________________________________________   LABS (all labs ordered are listed, but only abnormal results are displayed)  Labs Reviewed  COMPREHENSIVE METABOLIC PANEL - Abnormal; Notable for the following components:      Result Value   Glucose, Bld 103 (*)    All other components within normal limits  CBC - Abnormal; Notable for the following components:   WBC 13.7 (*)    All other components within normal limits  URINALYSIS, COMPLETE (UACMP) WITH MICROSCOPIC - Abnormal; Notable for the following components:   Color, Urine YELLOW (*)    APPearance HAZY (*)    All other components within normal limits  LIPASE, BLOOD  POC URINE PREG, ED  POCT PREGNANCY, URINE   ____________________________________________  EKG  None ____________________________________________  RADIOLOGY  CT scan unremarkable ____________________________________________   PROCEDURES  Procedure(s) performed: No  Procedures   Critical Care performed: No ____________________________________________   INITIAL IMPRESSION / ASSESSMENT AND PLAN / ED COURSE  Pertinent labs & imaging results that were available during my care of the patient were reviewed by me and considered in my medical decision making (see chart for details).  Patient presents with right lower quadrant abdominal pain described above.  Given abrupt onset, suspicious for possibly ureterolithiasis versus ovarian cyst rupture.  Less likely appendicitis.  Will give IV Toradol, obtain CT renal stone study.  Lab work is overall reassuring, mild nonspecific elevation white blood cell count.  Urinalysis unremarkable.  POCT pregnancy is negative  CT renal stone study demonstrates right ovarian cystic lesion, no  ureterolithiasis.  Ultrasound pelvis with Doppler obtained, no evidence of torsion, 3.4 cm complex right ovarian cystic lesion.  Discussed with patient the need for follow-up ultrasound in 6 to 12 weeks.  Patient reports her pain is down to a dull ache now.  Suspicious for ruptured ovarian cyst.  She would like to go home and I think it is reasonable, no indication for admission at this time.  Close follow-up with gynecology, return if worsening pain    ____________________________________________   FINAL CLINICAL IMPRESSION(S) / ED DIAGNOSES  Final diagnoses:  Ovarian cyst        Note:  This document was prepared using Dragon voice recognition software and may include unintentional dictation errors.   Lavonia Drafts, MD 10/16/18 [phone]

## 2018-10-16 NOTE — ED Notes (Signed)
Family at bedside. 

## 2018-10-16 NOTE — ED Triage Notes (Addendum)
Pt comes via POV from picking up her son from school with c/o right sided pain and some nausea. Pt states this started about 30 minutes ago.  Pt denies any CP,SOB, dizziness or diarrhea.  Pt states 7/10 pain at this time. Pt states it is sharp and cramping pain. Pt states she still has her appendix.  Pt sitting on the edge of chair and seems pretty uncomfortable.

## 2018-11-28 ENCOUNTER — Encounter: Admitting: Obstetrics and Gynecology

## 2018-12-07 ENCOUNTER — Encounter: Payer: Self-pay | Admitting: Certified Nurse Midwife

## 2018-12-07 ENCOUNTER — Other Ambulatory Visit: Payer: Self-pay

## 2018-12-07 ENCOUNTER — Ambulatory Visit (INDEPENDENT_AMBULATORY_CARE_PROVIDER_SITE_OTHER): Admitting: Certified Nurse Midwife

## 2018-12-07 VITALS — BP 121/97 | HR 106 | Ht 67.0 in | Wt 209.2 lb

## 2018-12-07 DIAGNOSIS — Z8742 Personal history of other diseases of the female genital tract: Secondary | ICD-10-CM

## 2018-12-07 NOTE — Patient Instructions (Signed)
Ovarian Cyst     An ovarian cyst is a fluid-filled sac that forms on an ovary. The ovaries are small organs that produce eggs in women. Various types of cysts can form on the ovaries. Some may cause symptoms and require treatment. Most ovarian cysts go away on their own, are not cancerous (are benign), and do not cause problems. Common types of ovarian cysts include:  Functional (follicle) cysts. ? Occur during the menstrual cycle, and usually go away with the next menstrual cycle if you do not get pregnant. ? Usually cause no symptoms.  Endometriomas. ? Are cysts that form from the tissue that lines the uterus (endometrium). ? Are sometimes called "chocolate cysts" because they become filled with blood that turns brown. ? Can cause pain in the lower abdomen during intercourse and during your period.  Cystadenoma cysts. ? Develop from cells on the outside surface of the ovary. ? Can get very large and cause lower abdomen pain and pain with intercourse. ? Can cause severe pain if they twist or break open (rupture).  Dermoid cysts. ? Are sometimes found in both ovaries. ? May contain different kinds of body tissue, such as skin, teeth, hair, or cartilage. ? Usually do not cause symptoms unless they get very big.  Theca lutein cysts. ? Occur when too much of a certain hormone (human chorionic gonadotropin) is produced and overstimulates the ovaries to produce an egg. ? Are most common after having procedures used to assist with the conception of a baby (in vitro fertilization). What are the causes? Ovarian cysts may be caused by:  Ovarian hyperstimulation syndrome. This is a condition that can develop from taking fertility medicines. It causes multiple large ovarian cysts to form.  Polycystic ovarian syndrome (PCOS). This is a common hormonal disorder that can cause ovarian cysts, as well as problems with your period or fertility. What increases the risk? The following factors may  make you more likely to develop ovarian cysts:  Being overweight or obese.  Taking fertility medicines.  Taking certain forms of hormonal birth control.  Smoking. What are the signs or symptoms? Many ovarian cysts do not cause symptoms. If symptoms are present, they may include:  Pelvic pain or pressure.  Pain in the lower abdomen.  Pain during sex.  Abdominal swelling.  Abnormal menstrual periods.  Increasing pain with menstrual periods. How is this diagnosed? These cysts are commonly found during a routine pelvic exam. You may have tests to find out more about the cyst, such as:  Ultrasound.  X-ray of the pelvis.  CT scan.  MRI.  Blood tests. How is this treated? Many ovarian cysts go away on their own without treatment. Your health care provider may want to check your cyst regularly for 2-3 months to see if it changes. If you are in menopause, it is especially important to have your cyst monitored closely because menopausal women have a higher rate of ovarian cancer. When treatment is needed, it may include:  Medicines to help relieve pain.  A procedure to drain the cyst (aspiration).  Surgery to remove the whole cyst.  Hormone treatment or birth control pills. These methods are sometimes used to help dissolve a cyst. Follow these instructions at home:  Take over-the-counter and prescription medicines only as told by your health care provider.  Do not drive or use heavy machinery while taking prescription pain medicine.  Get regular pelvic exams and Pap tests as often as told by your health care provider.    Return to your normal activities as told by your health care provider. Ask your health care provider what activities are safe for you.  Do not use any products that contain nicotine or tobacco, such as cigarettes and e-cigarettes. If you need help quitting, ask your health care provider.  Keep all follow-up visits as told by your health care provider.  This is important. Contact a health care provider if:  Your periods are late, irregular, or painful, or they stop.  You have pelvic pain that does not go away.  You have pressure on your bladder or trouble emptying your bladder completely.  You have pain during sex.  You have any of the following in your abdomen: ? A feeling of fullness. ? Pressure. ? Discomfort. ? Pain that does not go away. ? Swelling.  You feel generally ill.  You become constipated.  You lose your appetite.  You develop severe acne.  You start to have more body hair and facial hair.  You are gaining weight or losing weight without changing your exercise and eating habits.  You think you may be pregnant. Get help right away if:  You have abdominal pain that is severe or gets worse.  You cannot eat or drink without vomiting.  You suddenly develop a fever.  Your menstrual period is much heavier than usual. This information is not intended to replace advice given to you by your health care provider. Make sure you discuss any questions you have with your health care provider. Document Released: 12/20/2004 Document Revised: 03/20/2017 Document Reviewed: 05/24/2015 Elsevier Patient Education  2020 Elsevier Inc.  

## 2018-12-07 NOTE — Progress Notes (Signed)
GYN ENCOUNTER NOTE  Subjective:       Sydney James is a 23 y.o. [id number] female is here for gynecologic evaluation of the following issues:  1.follow up from ED for ovarian cyst rupture. .  She continues to have some residual pelvic discomfort but nothing like the pain that took her into the ED.    Obstetric History OB History  Gravida Para Term Preterm AB Living  5 2 0 2 3 2   SAB TAB Ectopic Multiple Live Births  3 0 0 0 2    # Outcome Date GA Lbr Len/2nd Weight Sex Delivery Anes PTL Lv  5 Preterm 12/22/17 [redacted]w[redacted]d  4 lb 9.4 oz (2.08 kg) M CS-LTranv EPI, Spinal  LIV  4 SAB 09/2016          3 SAB 2018          2 Preterm 01/25/15 [redacted]w[redacted]d 03:30 / 01:45 6 lb 4.2 oz (2.84 kg) M Vag-Spont EPI  LIV  1 SAB 2011            Past Medical History:  Diagnosis Date  . Lumbar herniated disc   . Migraine   . Preeclampsia   . PVC's (premature ventricular contractions)   . Raynaud's disease   . Scoliosis    Back brace for 2 years  . Scoliosis     Past Surgical History:  Procedure Laterality Date  . CESAREAN SECTION N/A 12/22/2017   Procedure: CESAREAN SECTION;  Surgeon: 12/24/2017, MD;  Location: [facility] ORS;  Service: Obstetrics;  Laterality: N/A;  Female Born @ 83    . DILATION AND CURETTAGE OF UTERUS    . DILATION AND EVACUATION N/A 03/15/2016   Procedure: DILATATION AND EVACUATION;  Surgeon: 03/17/2016, MD;  Location: [facility] ORS;  Service: Gynecology;  Laterality: N/A;  . WISDOM TOOTH EXTRACTION      Current Outpatient Medications on File Prior to Visit  Medication Sig Dispense Refill  . gabapentin (NEURONTIN) 100 MG capsule     . Multiple Vitamin (MULTIVITAMIN) capsule Take 1 capsule by mouth daily.    . naproxen (NAPROSYN) 500 MG tablet Take 1 tablet (500 mg total) by mouth 2 (two) times daily with a meal. 20 tablet 2  . nortriptyline (PAMELOR) 10 MG capsule     . venlafaxine (EFFEXOR) 37.5 MG tablet Take 1 tab once a day for a week then increase to 1 tab twice a  day    . Drospirenone (SLYND) 4 MG TABS Take 1 tablet by mouth daily. (Patient not taking: Reported on 12/07/2018) 84 tablet 3   No current facility-administered medications on file prior to visit.     Allergies  Allergen Reactions  . Aloe Hives  . Lubricants     Has to have water based lubricant. Allergy to non-water based lubricant  . Sulfa Antibiotics Itching and Rash  . Tape Itching and Rash    Social History   Socioeconomic History  . Marital status: Married    Spouse name: Not on file  . Number of children: Not on file  . Years of education: Not on file  . Highest education level: Not on file  Occupational History  . Not on file  Social Needs  . Financial resource strain: Not hard at all  . Food insecurity    Worry: Never true    Inability: Never true  . Transportation needs    Medical: No    Non-medical: No  Tobacco Use  . Smoking status:  Never Smoker  . Smokeless tobacco: Never Used  Substance and Sexual Activity  . Alcohol use: No  . Drug use: No  . Sexual activity: Yes    Birth control/protection: Condom  Lifestyle  . Physical activity    Days per week: 0 days    Minutes per session: 0 min  . Stress: Not at all  Relationships  . Social connections    Talks on phone: More than three times a week    Gets together: More than three times a week    Attends religious service: Never    Active member of club or organization: Yes    Attends meetings of clubs or organizations: More than 4 times per year    Relationship status: Married  . Intimate partner violence    Fear of current or ex partner: No    Emotionally abused: No    Physically abused: No    Forced sexual activity: No  Other Topics Concern  . Not on file  Social History Narrative  . Not on file    Family History  Problem Relation Age of Onset  . Hypertension Father   . Hypertension Mother   . Cancer Maternal Grandmother   . Cancer Maternal Grandfather   . Diabetes Maternal Grandfather    . Cancer Paternal Grandmother   . Diabetes Paternal Grandfather     The following portions of the patient's history were reviewed and updated as appropriate: allergies, current medications, past family history, past medical history, past social history, past surgical history and problem list.  Review of Systems Review of Systems - Negative except hpi Review of Systems - General ROS: negative for - chills, fatigue, fever, hot flashes, malaise or night sweats Hematological and Lymphatic ROS: negative for - bleeding problems or swollen lymph nodes Gastrointestinal ROS: negative for - abdominal pain, blood in stools, change in bowel habits and nausea/vomiting Musculoskeletal ROS: negative for - joint pain, muscle pain or muscular weakness Genito-Urinary ROS: negative for - change in menstrual cycle, dysmenorrhea, dyspareunia, dysuria, genital discharge, genital ulcers, hematuria, incontinence, irregular/heavy menses, nocturia or pelvic painjj  Objective:   Ht 5\' 7"  (1.702 m)   Wt 209 lb 3 oz (94.9 kg)   LMP 11/18/2018 (Exact Date)   Breastfeeding No   BMI 32.76 kg/m  CONSTITUTIONAL: Well-developed, well-nourished female in no acute distress.  HENT:  Normocephalic, atraumatic.  NECK: Normal range of motion, supple, no masses.  Normal thyroid.  SKIN: Skin is warm and dry. No rash noted. Not diaphoretic. No erythema. No pallor. [place]: Alert and oriented to person, place, and time. PSYCHIATRIC: Normal mood and affect. Normal behavior. Normal judgment and thought content. CARDIOVASCULAR:Not Examined RESPIRATORY: Not Examined BREASTS: Not Examined ABDOMEN: Soft, non distended; Non tender.  No Organomegaly. PELVIC:not indicated  MUSCULOSKELETAL: Normal range of motion. No tenderness.  No cyanosis, clubbing, or edema.  Assessment:  History of ovarian Cyst.    Plan:   Dicussed self help measures for residual pain . Reviewed ovarian cyst and treatment options. Recommend follow up u/s  in 6 wks. She verbalizes and agrees to plan. Orders placed . Will follow up with results. Pt also admits to having elevated Bps intermittently especially with stress. Recommend follow up with PCP for evaluation of long term BP meds. Red flag symptoms reviewed.  Return prn.   I attest more than 50% of visit spent reviewing history, discussing ovarinan cysts and treatment options. Discussing plan of care. Face to fae time 10 min.   Deneise Lever  [place]ville Silos, CNM

## 2019-01-22 ENCOUNTER — Other Ambulatory Visit: Payer: Self-pay

## 2019-01-22 ENCOUNTER — Ambulatory Visit (INDEPENDENT_AMBULATORY_CARE_PROVIDER_SITE_OTHER)

## 2019-01-22 DIAGNOSIS — Z8742 Personal history of other diseases of the female genital tract: Secondary | ICD-10-CM

## 2019-01-22 DIAGNOSIS — R102 Pelvic and perineal pain: Secondary | ICD-10-CM

## 2019-01-28 ENCOUNTER — Ambulatory Visit: Attending: Internal Medicine

## 2019-01-28 DIAGNOSIS — Z20822 Contact with and (suspected) exposure to covid-19: Secondary | ICD-10-CM

## 2019-01-29 LAB — NOVEL CORONAVIRUS, NAA: SARS-CoV-2, NAA: NOT DETECTED

## 2019-02-27 ENCOUNTER — Other Ambulatory Visit: Payer: Self-pay

## 2019-02-27 ENCOUNTER — Encounter: Payer: Self-pay | Admitting: Certified Nurse Midwife

## 2019-02-27 ENCOUNTER — Other Ambulatory Visit (HOSPITAL_COMMUNITY)
Admission: RE | Admit: 2019-02-27 | Discharge: 2019-02-27 | Disposition: A | Discharge status: Home / Self Care | Source: Ambulatory Visit | Attending: Certified Nurse Midwife | Admitting: Certified Nurse Midwife

## 2019-02-27 ENCOUNTER — Ambulatory Visit (INDEPENDENT_AMBULATORY_CARE_PROVIDER_SITE_OTHER): Admitting: Certified Nurse Midwife

## 2019-02-27 VITALS — BP 125/88 | HR 90 | Ht 67.0 in | Wt 210.4 lb

## 2019-02-27 DIAGNOSIS — N898 Other specified noninflammatory disorders of vagina: Secondary | ICD-10-CM | POA: Insufficient documentation

## 2019-02-27 DIAGNOSIS — Z7689 Persons encountering health services in other specified circumstances: Secondary | ICD-10-CM | POA: Diagnosis not present

## 2019-02-27 DIAGNOSIS — E669 Obesity, unspecified: Secondary | ICD-10-CM | POA: Insufficient documentation

## 2019-02-27 MED ORDER — FLUCONAZOLE 150 MG PO TABS: 150.0000 mg | ORAL_TABLET | Freq: Once | ORAL | 0 refills | Status: AC

## 2019-02-27 NOTE — Patient Instructions (Signed)
Vaginal Yeast Infection, Adult  Vaginal yeast infection is a condition that causes vaginal discharge as well as soreness, swelling, and redness (inflammation) of the vagina. This is a common condition. Some women get this infection frequently. What are the causes? This condition is caused by a change in the normal balance of the yeast (candida) and bacteria that live in the vagina. This change causes an overgrowth of yeast, which causes the inflammation. What increases the risk? The condition is more likely to develop in women who:  Take antibiotic medicines.  Have diabetes.  Take birth control pills.  Are pregnant.  Douche often.  Have a weak body defense system (immune system).  Have been taking steroid medicines for a long time.  Frequently wear tight clothing. What are the signs or symptoms? Symptoms of this condition include:  White, thick, creamy vaginal discharge.  Swelling, itching, redness, and irritation of the vagina. The lips of the vagina (vulva) may be affected as well.  Pain or a burning feeling while urinating.  Pain during sex. How is this diagnosed? This condition is diagnosed based on:  Your medical history.  A physical exam.  A pelvic exam. Your health care provider will examine a sample of your vaginal discharge under a microscope. Your health care provider may send this sample for testing to confirm the diagnosis. How is this treated? This condition is treated with medicine. Medicines may be over-the-counter or prescription. You may be told to use one or more of the following:  Medicine that is taken by mouth (orally).  Medicine that is applied as a cream (topically).  Medicine that is inserted directly into the vagina (suppository). Follow these instructions at home:  Lifestyle  Do not have sex until your health care provider approves. Tell your sex partner that you have a yeast infection. That person should go to his or her health care  provider and ask if they should also be treated.  Do not wear tight clothes, such as pantyhose or tight pants.  Wear breathable cotton underwear. General instructions  Take or apply over-the-counter and prescription medicines only as told by your health care provider.  Eat more yogurt. This may help to keep your yeast infection from returning.  Do not use tampons until your health care provider approves.  Try taking a sitz bath to help with discomfort. This is a warm water bath that is taken while you are sitting down. The water should only come up to your hips and should cover your buttocks. Do this 3-4 times per day or as told by your health care provider.  Do not douche.  If you have diabetes, keep your blood sugar levels under control.  Keep all follow-up visits as told by your health care provider. This is important. Contact a health care provider if:  You have a fever.  Your symptoms go away and then return.  Your symptoms do not get better with treatment.  Your symptoms get worse.  You have new symptoms.  You develop blisters in or around your vagina.  You have blood coming from your vagina and it is not your menstrual period.  You develop pain in your abdomen. Summary  Vaginal yeast infection is a condition that causes discharge as well as soreness, swelling, and redness (inflammation) of the vagina.  This condition is treated with medicine. Medicines may be over-the-counter or prescription.  Take or apply over-the-counter and prescription medicines only as told by your health care provider.  Do not douche.   Do not have sex or use tampons until your health care provider approves.  Contact a health care provider if your symptoms do not get better with treatment or your symptoms go away and then return. This information is not intended to replace advice given to you by your health care provider. Make sure you discuss any questions you have with your health care  provider. Document Revised: 07/20/2018 Document Reviewed: 05/08/2017 Elsevier Patient Education  2020 Elsevier Inc.  

## 2019-02-27 NOTE — Progress Notes (Signed)
   GYNECOLOGY ANNUAL PREVENTATIVE CARE ENCOUNTER NOTE  History:     Sydney James is a 24 y.o. [id number] female here for a routine annual gynecologic exam.  Current complaints: increased vaginal discharge with itching and burning for the past few days. Was on antibiotics for ear infections. .   Denies abnormal vaginal bleeding, discharge, pelvic pain, problems with intercourse or other gynecologic concerns.    Gynecologic History Patient's last menstrual period was 02/17/2019 (exact date). Contraception: oral progesterone-only contraceptive Last Pap: 07/19/2017. Results were: normal  Last mammogram: n/a Obstetric History OB History  Gravida Para Term Preterm AB Living  5 2 0 2 3 2   SAB TAB Ectopic Multiple Live Births  3 0 0 0 2    # Outcome Date GA Lbr Len/2nd Weight Sex Delivery Anes PTL Lv  5 Preterm 12/22/17 [redacted]w[redacted]d  4 lb 9.4 oz (2.08 kg) M CS-LTranv EPI, Spinal  LIV  4 SAB 09/2016          3 SAB 2018          2 Preterm 01/25/15 [redacted]w[redacted]d 03:30 / 01:45 6 lb 4.2 oz (2.84 kg) M Vag-Spont EPI  LIV  1 SAB 2011            Past Medical History:  Diagnosis Date  . Lumbar herniated disc   . Migraine   . Preeclampsia   . PVC's (premature ventricular contractions)   . Raynaud's disease   . Scoliosis    Back brace for 2 years  . Scoliosis      Review of Systems Pertinent items noted in HPI and remainder of comprehensive ROS otherwise negative.  Physical Exam:  BP 125/88   Pulse 90   Ht 5\' 7"  (1.702 m)   Wt 210 lb 7 oz (95.5 kg)   LMP 02/17/2019 (Exact Date)   BMI 32.96 kg/m  CONSTITUTIONAL: Well-developed, well-nourished female in no acute distress.  HENT:  Normocephalic, atraumatic, External right and left ear normal. Oropharynx is clear and moist EYES: Conjunctivae and EOM are normal. Pupils are equal, round, and reactive to light. No scleral icterus.  NECK: Normal range of motion, supple, no masses.  Normal thyroid.  SKIN: Skin is warm and dry. No rash noted. Not  diaphoretic. No erythema. No pallor. MUSCULOSKELETAL: Normal range of motion. No tenderness.  No cyanosis, clubbing, or edema.  2+ distal pulses. NEUROLOGIC: Alert and oriented to person, place, and time. Normal reflexes, muscle tone coordination.  PSYCHIATRIC: Normal mood and affect. Normal behavior. Normal judgment and thought content. CARDIOVASCULAR: Normal heart rate noted, regular rhythm RESPIRATORY: Clear to auscultation bilaterally. Effort and breath sounds normal, no problems with respiration noted. BREASTS: Symmetric in size. No masses, tenderness, skin changes, nipple drainage, or lymphadenopathy bilaterally. ABDOMEN: Soft, no distention noted.  No tenderness, rebound or guarding.  PELVIC: Normal appearing external genitalia and urethral meatus; thick white  appearing vaginal discharge. Mild redness noted   Assessment and Plan:    Cervicoancillary swab collected for BV and yeast. Suspect yeast. Orders placed for diflucan. Pt has concerns regarding weight loss . She has been trying to lose weight since having her son has not been successful. She is exercising regularly and monitoring her diet. Referral for nutritoonal counseling placed. Discussed weight loss program and use of medications to assist. Information given. Pt instructed to follow up if she would like to do program. She verbalizes and agrees to plan.   , CNM

## 2019-02-28 LAB — CERVICOVAGINAL ANCILLARY ONLY
Bacterial Vaginitis (gardnerella): NEGATIVE
Candida Glabrata: NEGATIVE
Candida Vaginitis: POSITIVE — AB
Comment: NEGATIVE
Comment: NEGATIVE
Comment: NEGATIVE

## 2019-03-11 ENCOUNTER — Encounter: Payer: Self-pay | Admitting: Certified Nurse Midwife

## 2019-03-11 ENCOUNTER — Other Ambulatory Visit: Payer: Self-pay

## 2019-03-11 ENCOUNTER — Ambulatory Visit (INDEPENDENT_AMBULATORY_CARE_PROVIDER_SITE_OTHER): Admitting: Certified Nurse Midwife

## 2019-03-11 VITALS — BP 130/88 | HR 82 | Ht 67.0 in | Wt 215.2 lb

## 2019-03-11 DIAGNOSIS — E669 Obesity, unspecified: Secondary | ICD-10-CM | POA: Diagnosis not present

## 2019-03-11 MED ORDER — PHENTERMINE HCL 37.5 MG PO CAPS: 37.5000 mg | ORAL_CAPSULE | ORAL | 0 refills | Status: DC

## 2019-03-11 MED ORDER — CYANOCOBALAMIN 1000 MCG/ML IJ SOLN: 1000.0000 ug | Freq: Once | INTRAMUSCULAR | 3 refills | Status: AC

## 2019-03-11 NOTE — Progress Notes (Signed)
Subjective:  Callen Zuba is a 24 y.o. [id number] at Unknown being seen today for weight loss management- initial visit.  Patient reports General ROS: negative and reports previous weight loss attempts: over the past several months . She has been trying to lose wt since having her baby.  Management changes made at the last visit include: Diet and exercise. Onset was gradua,  Months with pregnancy  Onset followed:   recent pregnancy Associated symptoms include: fatigue, depression,  change in clothing fit and menstrual changes. Current treatment includes: small frequent feedings, vitamin B-12 injections and appetite stimulant.  Pertinent medical history includes: pregnancy    Risk factors include: none  The patient has a surgical history of: cesarean section  Pertinent social history includes: 2 small children making it challenging to exercise Past evaluation has included: metabolic profile, hemoglobin A1c, thyroid panel  And nurtritonist evaluation. Has appointment in April Had tele visit.  Past treatment has included: small frequent feedings,  exercise management and dietary changes The following portions of the patient's history were reviewed and updated as appropriate: allergies, current medications, past family history, past medical history, past social history, past surgical history and problem list.   Objective:   Vitals:   03/11/19 1515  BP: 130/88  Pulse: 82  Weight: 215 lb 4 oz (97.6 kg)  Height: 5\' 7"  (1.702 m)  Waits circumference 39 Body mass index is 33.71 kg/m.   General:  Alert, oriented and cooperative. Patient is in no acute distress.  :   :   :   :   :   :   PE: Well groomed female in no current distress,   Mental Status: Normal mood and affect. Normal behavior. Normal judgment and thought content.   Current BMI: Body mass index is 33.71 kg/m.   Assessment and Plan:  Obesity   Plan: low carb, High protein diet, calorine counting RX for adipex 37.5 mg  daily and B12 .ml monthly, to start now with first injection given at today's visit. Reviewed side-effects common to both medications and expected outcomes. Increase daily water intake to at least 8 bottle a day, every day. Works out 4-5 times weekly for 30 min.  Goal is to reduse weight by 5% by end of three months, and will re-evaluate then.  RTC in 4 weeks for Nurse visit to check weight & BP, and get next B12 injections.   I attest more than 50% of visit spent reviewing history, discussing diet, exercise, and medication risks /benefits. Discussing possible side effects and red flag symptoms.  Face to face time 15 min   , CNM    I     Consider the Low Glycemic Index Diet and 6 smaller meals daily .  This boosts your metabolism and regulates your sugars:   Use the protein bar by Atkins because they have lots of fiber in them  Find the low carb flatbreads, tortillas and pita breads for sandwiches:  Joseph's makes a pita bread and a flat bread , available at [facility] and [employer]; Toufayah makes a low carb flatbread available at [facility] and HT that is 9 net carbs and 100 cal Mission makes a low carb whole wheat tortilla available at [employer] most grocery stores with 6 net carbs and 210 cal  [employer] yogurt can still have a lot of carbs .  Dannon [employer] N fit has 80 cal and 8 carbs

## 2019-03-11 NOTE — Patient Instructions (Signed)
Exercising to Lose Weight Exercise is structured, repetitive physical activity to improve fitness and health. Getting regular exercise is important for everyone. It is especially important if you are overweight. Being overweight increases your risk of heart disease, stroke, diabetes, high blood pressure, and several types of cancer. Reducing your calorie intake and exercising can help you lose weight. Exercise is usually categorized as moderate or vigorous intensity. To lose weight, most people need to do a certain amount of moderate-intensity or vigorous-intensity exercise each week. Moderate-intensity exercise  Moderate-intensity exercise is any activity that gets you moving enough to burn at least three times more energy (calories) than if you were sitting. Examples of moderate exercise include:  Walking a mile in 15 minutes.  Doing light yard work.  Biking at an easy pace. Most people should get at least 150 minutes (2 hours and 30 minutes) a week of moderate-intensity exercise to maintain their body weight. Vigorous-intensity exercise Vigorous-intensity exercise is any activity that gets you moving enough to burn at least six times more calories than if you were sitting. When you exercise at this intensity, you should be working hard enough that you are not able to carry on a conversation. Examples of vigorous exercise include:  Running.  Playing a team sport, such as football, basketball, and soccer.  Jumping rope. Most people should get at least 75 minutes (1 hour and 15 minutes) a week of vigorous-intensity exercise to maintain their body weight. How can exercise affect me? When you exercise enough to burn more calories than you eat, you lose weight. Exercise also reduces body fat and builds muscle. The more muscle you have, the more calories you burn. Exercise also:  Improves mood.  Reduces stress and tension.  Improves your overall fitness, flexibility, and  endurance.  Increases bone strength. The amount of exercise you need to lose weight depends on:  Your age.  The type of exercise.  Any health conditions you have.  Your overall physical ability. Talk to your health care provider about how much exercise you need and what types of activities are safe for you. What actions can I take to lose weight? Nutrition   Make changes to your diet as told by your health care provider or diet and nutrition specialist (dietitian). This may include: ? Eating fewer calories. ? Eating more protein. ? Eating less unhealthy fats. ? Eating a diet that includes fresh fruits and vegetables, whole grains, low-fat dairy products, and lean protein. ? Avoiding foods with added fat, salt, and sugar.  Drink plenty of water while you exercise to prevent dehydration or heat stroke. Activity  Choose an activity that you enjoy and set realistic goals. Your health care provider can help you make an exercise plan that works for you.  Exercise at a moderate or vigorous intensity most days of the week. ? The intensity of exercise may vary from person to person. You can tell how intense a workout is for you by paying attention to your breathing and heartbeat. Most people will notice their breathing and heartbeat get faster with more intense exercise.  Do resistance training twice each week, such as: ? Push-ups. ? Sit-ups. ? Lifting weights. ? Using resistance bands.  Getting short amounts of exercise can be just as helpful as long structured periods of exercise. If you have trouble finding time to exercise, try to include exercise in your daily routine. ? Get up, stretch, and walk around every 30 minutes throughout the day. ? Go for a   walk during your lunch break. ? Park your car farther away from your destination. ? If you take public transportation, get off one stop early and walk the rest of the way. ? Make phone calls while standing up and walking  around. ? Take the stairs instead of elevators or escalators.  Wear comfortable clothes and shoes with good support.  Do not exercise so much that you hurt yourself, feel dizzy, or get very short of breath. Where to find more information  [place] Department of Health and Human Services: www.hhs.gov  Centers for Disease Control and Prevention (CDC): www.cdc.gov Contact a health care provider:  Before starting a new exercise program.  If you have questions or concerns about your weight.  If you have a medical problem that keeps you from exercising. Get help right away if you have any of the following while exercising:  Injury.  Dizziness.  Difficulty breathing or shortness of breath that does not go away when you stop exercising.  Chest pain.  Rapid heartbeat. Summary  Being overweight increases your risk of heart disease, stroke, diabetes, high blood pressure, and several types of cancer.  Losing weight happens when you burn more calories than you eat.  Reducing the amount of calories you eat in addition to getting regular moderate or vigorous exercise each week helps you lose weight. This information is not intended to replace advice given to you by your health care provider. Make sure you discuss any questions you have with your health care provider. Document Revised: 01/02/2017 Document Reviewed: 01/02/2017 Elsevier Patient Education  2020 Elsevier Inc. Calorie Counting for Weight Loss Calories are units of energy. Your body needs a certain amount of calories from food to keep you going throughout the day. When you eat more calories than your body needs, your body stores the extra calories as fat. When you eat fewer calories than your body needs, your body burns fat to get the energy it needs. Calorie counting means keeping track of how many calories you eat and drink each day. Calorie counting can be helpful if you need to lose weight. If you make sure to eat fewer calories  than your body needs, you should lose weight. Ask your health care provider what a healthy weight is for you. For calorie counting to work, you will need to eat the right number of calories in a day in order to lose a healthy amount of weight per week. A dietitian can help you determine how many calories you need in a day and will give you suggestions on how to reach your calorie goal.  A healthy amount of weight to lose per week is usually 1-2 lb (0.5-0.9 kg). This usually means that your daily calorie intake should be reduced by 500-750 calories.  Eating 1,200 - 1,500 calories per day can help most women lose weight.  Eating 1,500 - 1,800 calories per day can help most men lose weight. What is my plan? My goal is to have __________ calories per day. If I have this many calories per day, I should lose around __________ pounds per week. What do I need to know about calorie counting? In order to meet your daily calorie goal, you will need to:  Find out how many calories are in each food you would like to eat. Try to do this before you eat.  Decide how much of the food you plan to eat.  Write down what you ate and how many calories it had. Doing this   is called keeping a food log. To successfully lose weight, it is important to balance calorie counting with a healthy lifestyle that includes regular activity. Aim for 150 minutes of moderate exercise (such as walking) or 75 minutes of vigorous exercise (such as running) each week. Where do I find calorie information?  The number of calories in a food can be found on a Nutrition Facts label. If a food does not have a Nutrition Facts label, try to look up the calories online or ask your dietitian for help. Remember that calories are listed per serving. If you choose to have more than one serving of a food, you will have to multiply the calories per serving by the amount of servings you plan to eat. For example, the label on a package of bread might  say that a serving size is 1 slice and that there are 90 calories in a serving. If you eat 1 slice, you will have eaten 90 calories. If you eat 2 slices, you will have eaten 180 calories. How do I keep a food log? Immediately after each meal, record the following information in your food log:  What you ate. Don't forget to include toppings, sauces, and other extras on the food.  How much you ate. This can be measured in cups, ounces, or number of items.  How many calories each food and drink had.  The total number of calories in the meal. Keep your food log near you, such as in a small notebook in your pocket, or use a mobile app or website. Some programs will calculate calories for you and show you how many calories you have left for the day to meet your goal. What are some calorie counting tips?   Use your calories on foods and drinks that will fill you up and not leave you hungry: ? Some examples of foods that fill you up are nuts and nut butters, vegetables, lean proteins, and high-fiber foods like whole grains. High-fiber foods are foods with more than 5 g fiber per serving. ? Drinks such as sodas, specialty coffee drinks, alcohol, and juices have a lot of calories, yet do not fill you up.  Eat nutritious foods and avoid empty calories. Empty calories are calories you get from foods or beverages that do not have many vitamins or protein, such as candy, sweets, and soda. It is better to have a nutritious high-calorie food (such as an avocado) than a food with few nutrients (such as a bag of chips).  Know how many calories are in the foods you eat most often. This will help you calculate calorie counts faster.  Pay attention to calories in drinks. Low-calorie drinks include water and unsweetened drinks.  Pay attention to nutrition labels for "low fat" or "fat free" foods. These foods sometimes have the same amount of calories or more calories than the full fat versions. They also often  have added sugar, starch, or salt, to make up for flavor that was removed with the fat.  Find a way of tracking calories that works for you. Get creative. Try different apps or programs if writing down calories does not work for you. What are some portion control tips?  Know how many calories are in a serving. This will help you know how many servings of a certain food you can have.  Use a measuring cup to measure serving sizes. You could also try weighing out portions on a kitchen scale. With time, you will be able   to estimate serving sizes for some foods.  Take some time to put servings of different foods on your favorite plates, bowls, and cups so you know what a serving looks like.  Try not to eat straight from a bag or box. Doing this can lead to overeating. Put the amount you would like to eat in a cup or on a plate to make sure you are eating the right portion.  Use smaller plates, glasses, and bowls to prevent overeating.  Try not to multitask (for example, watch TV or use your computer) while eating. If it is time to eat, sit down at a table and enjoy your food. This will help you to know when you are full. It will also help you to be aware of what you are eating and how much you are eating. What are tips for following this plan? Reading food labels  Check the calorie count compared to the serving size. The serving size may be smaller than what you are used to eating.  Check the source of the calories. Make sure the food you are eating is high in vitamins and protein and low in saturated and trans fats. Shopping  Read nutrition labels while you shop. This will help you make healthy decisions before you decide to purchase your food.  Make a grocery list and stick to it. Cooking  Try to cook your favorite foods in a healthier way. For example, try baking instead of frying.  Use low-fat dairy products. Meal planning  Use more fruits and vegetables. Half of your plate should be  fruits and vegetables.  Include lean proteins like poultry and fish. How do I count calories when eating out?  Ask for smaller portion sizes.  Consider sharing an entree and sides instead of getting your own entree.  If you get your own entree, eat only half. Ask for a box at the beginning of your meal and put the rest of your entree in it so you are not tempted to eat it.  If calories are listed on the menu, choose the lower calorie options.  Choose dishes that include vegetables, fruits, whole grains, low-fat dairy products, and lean protein.  Choose items that are boiled, broiled, grilled, or steamed. Stay away from items that are buttered, battered, fried, or served with cream sauce. Items labeled "crispy" are usually fried, unless stated otherwise.  Choose water, low-fat milk, unsweetened iced tea, or other drinks without added sugar. If you want an alcoholic beverage, choose a lower calorie option such as a glass of wine or light beer.  Ask for dressings, sauces, and syrups on the side. These are usually high in calories, so you should limit the amount you eat.  If you want a salad, choose a garden salad and ask for grilled meats. Avoid extra toppings like bacon, cheese, or fried items. Ask for the dressing on the side, or ask for olive oil and vinegar or lemon to use as dressing.  Estimate how many servings of a food you are given. For example, a serving of cooked rice is  cup or about the size of half a baseball. Knowing serving sizes will help you be aware of how much food you are eating at restaurants. The list below tells you how big or small some common portion sizes are based on everyday objects: ? 1 oz--4 stacked dice. ? 3 oz--1 deck of cards. ? 1 tsp--1 die. ? 1 Tbsp-- a ping-pong ball. ? 2 Tbsp--1 ping-pong ball. ?    cup-- baseball. ? 1 cup--1 baseball. Summary  Calorie counting means keeping track of how many calories you eat and drink each day. If you eat fewer  calories than your body needs, you should lose weight.  A healthy amount of weight to lose per week is usually 1-2 lb (0.5-0.9 kg). This usually means reducing your daily calorie intake by 500-750 calories.  The number of calories in a food can be found on a Nutrition Facts label. If a food does not have a Nutrition Facts label, try to look up the calories online or ask your dietitian for help.  Use your calories on foods and drinks that will fill you up, and not on foods and drinks that will leave you hungry.  Use smaller plates, glasses, and bowls to prevent overeating. This information is not intended to replace advice given to you by your health care provider. Make sure you discuss any questions you have with your health care provider. Document Revised: 09/08/2017 Document Reviewed: 11/20/2015 Elsevier Patient Education  2020 Elsevier Inc.  

## 2019-03-12 ENCOUNTER — Telehealth: Payer: Self-pay

## 2019-03-12 LAB — COMPREHENSIVE METABOLIC PANEL
ALT: 21 IU/L (ref 0–32)
AST: 17 IU/L (ref 0–40)
Albumin/Globulin Ratio: 1.8 (ref 1.2–2.2)
Albumin: 4.3 g/dL (ref 3.9–5.0)
Alkaline Phosphatase: 73 IU/L (ref 39–117)
BUN/Creatinine Ratio: 17 (ref 9–23)
BUN: 12 mg/dL (ref 6–20)
Bilirubin Total: <0.2 mg/dL (ref 0.0–1.2)
CO2: 24 mmol/L (ref 20–29)
Calcium: 8.8 mg/dL (ref 8.7–10.2)
Chloride: 101 mmol/L (ref 96–106)
Creatinine, Ser: 0.72 mg/dL (ref 0.57–1.00)
GFR calc Af Amer: 135 mL/min/{1.73_m2} (ref >59)
GFR calc non Af Amer: 117 mL/min/{1.73_m2} (ref >59)
Globulin, Total: 2.4 g/dL (ref 1.5–4.5)
Glucose: 86 mg/dL (ref 65–99)
Potassium: 4.1 mmol/L (ref 3.5–5.2)
Sodium: 138 mmol/L (ref 134–144)
Total Protein: 6.7 g/dL (ref 6.0–8.5)

## 2019-03-12 LAB — LIPID PANEL
Chol/HDL Ratio: 5.5 ratio — ABNORMAL HIGH (ref 0.0–4.4)
Cholesterol, Total: 191 mg/dL (ref 100–199)
HDL: 35 mg/dL — ABNORMAL LOW (ref >39)
LDL Chol Calc (NIH): 102 mg/dL — ABNORMAL HIGH (ref 0–99)
Triglycerides: 317 mg/dL — ABNORMAL HIGH (ref 0–149)
VLDL Cholesterol Cal: 54 mg/dL — ABNORMAL HIGH (ref 5–40)

## 2019-03-12 LAB — THYROID PANEL WITH TSH
Free Thyroxine Index: 1.6 (ref 1.2–4.9)
T3 Uptake Ratio: 24 % (ref 24–39)
T4, Total: 6.7 ug/dL (ref 4.5–12.0)
TSH: 1.59 u[IU]/mL (ref 0.450–4.500)

## 2019-03-12 LAB — HEMOGLOBIN A1C
Est. average glucose Bld gHb Est-mCnc: 105 mg/dL
Hgb A1c MFr Bld: 5.3 % (ref 4.8–5.6)

## 2019-03-12 NOTE — Telephone Encounter (Signed)
mychart message sent to patient- insurance plan does not cover phentermine. Food [employer] with [employer].

## 2019-03-14 ENCOUNTER — Ambulatory Visit (INDEPENDENT_AMBULATORY_CARE_PROVIDER_SITE_OTHER): Admitting: Certified Nurse Midwife

## 2019-03-14 ENCOUNTER — Other Ambulatory Visit: Payer: Self-pay

## 2019-03-14 VITALS — Wt 212.2 lb

## 2019-03-14 DIAGNOSIS — Z79899 Other long term (current) drug therapy: Secondary | ICD-10-CM

## 2019-03-14 DIAGNOSIS — D519 Vitamin B12 deficiency anemia, unspecified: Secondary | ICD-10-CM | POA: Diagnosis not present

## 2019-03-14 MED ORDER — CYANOCOBALAMIN 1000 MCG/ML IJ SOLN
1000.0000 ug | Freq: Once | INTRAMUSCULAR | Status: AC
  Administered 2019-03-14: 09:00:00 1000 ug via INTRAMUSCULAR

## 2019-03-14 NOTE — Progress Notes (Signed)
Patient presents for a nurse visit for a Vitamin B 12 injection. Tolerated well.

## 2019-04-15 ENCOUNTER — Ambulatory Visit (INDEPENDENT_AMBULATORY_CARE_PROVIDER_SITE_OTHER): Admitting: Certified Nurse Midwife

## 2019-04-15 ENCOUNTER — Encounter: Payer: Self-pay | Admitting: Certified Nurse Midwife

## 2019-04-15 ENCOUNTER — Other Ambulatory Visit: Payer: Self-pay

## 2019-04-15 VITALS — BP 131/87 | HR 96 | Ht 67.0 in | Wt 202.6 lb

## 2019-04-15 DIAGNOSIS — Z6831 Body mass index (BMI) 31.0-31.9, adult: Secondary | ICD-10-CM | POA: Diagnosis not present

## 2019-04-15 DIAGNOSIS — Z7689 Persons encountering health services in other specified circumstances: Secondary | ICD-10-CM

## 2019-04-15 MED ORDER — PHENTERMINE HCL 37.5 MG PO CAPS: 37.5000 mg | ORAL_CAPSULE | ORAL | 0 refills | Status: DC

## 2019-04-15 MED ORDER — CYANOCOBALAMIN 1000 MCG/ML IJ SOLN
1000.0000 ug | Freq: Once | INTRAMUSCULAR | Status: AC
  Administered 2019-04-15: 1000 ug via INTRAMUSCULAR

## 2019-04-15 NOTE — Patient Instructions (Signed)

## 2019-04-15 NOTE — Progress Notes (Signed)
SUBJECTIVE:  24 y.o. here for follow-up weight loss visit, previously seen 4 weeks ago. Denies any concerns and feels like medication is working well. She has lost 12.5lbs over the last month, She denies any negative side effects from medication. She states that her BP have improved and her migranes have resolved. She is not eating any processed foods, and is eating more organic food. She continues to exercise 4-5 times wk x 30 min.   OBJECTIVE:  BP 131/87   Pulse 96   Ht 5\' 7"  (1.702 m)   Wt 202 lb 9 oz (91.9 kg)   BMI 31.73 kg/m   Body mass index is 31.73 kg/m. Waits circumference: 37 Patient appears well. ASSESSMENT:  Obesity- responding well to weight loss plan PLAN:  To continue with current medications. B12 1091mcg/ml injection given RTC in 4 weeks as planned  80m, CNM

## 2019-04-15 NOTE — Addendum Note (Signed)
Addended by: Brooke Dare on: 04/15/2019 10:15 AM   Modules accepted: Orders

## 2019-04-15 NOTE — Progress Notes (Signed)
Waist circumference 37"

## 2019-04-29 ENCOUNTER — Telehealth: Payer: Self-pay | Admitting: Certified Nurse Midwife

## 2019-04-29 ENCOUNTER — Telehealth: Payer: Self-pay

## 2019-04-29 NOTE — Telephone Encounter (Signed)
Patient called with questions about her phentamine and her blood pressure. Could you please advise? Patient requested that nurse give her a call at [phone].

## 2019-04-29 NOTE — Telephone Encounter (Signed)
Returned patients call- voicemail message left for patient- please respond by mychart due to both parties missing the others call.

## 2019-05-13 ENCOUNTER — Encounter: Payer: Self-pay | Admitting: Certified Nurse Midwife

## 2019-05-13 ENCOUNTER — Ambulatory Visit (INDEPENDENT_AMBULATORY_CARE_PROVIDER_SITE_OTHER): Admitting: Certified Nurse Midwife

## 2019-05-13 ENCOUNTER — Other Ambulatory Visit: Payer: Self-pay

## 2019-05-13 VITALS — BP 117/84 | HR 96 | Wt 202.6 lb

## 2019-05-13 DIAGNOSIS — Z6831 Body mass index (BMI) 31.0-31.9, adult: Secondary | ICD-10-CM

## 2019-05-13 DIAGNOSIS — E669 Obesity, unspecified: Secondary | ICD-10-CM

## 2019-05-13 DIAGNOSIS — N926 Irregular menstruation, unspecified: Secondary | ICD-10-CM

## 2019-05-13 MED ORDER — CYANOCOBALAMIN 1000 MCG/ML IJ SOLN
1000.0000 ug | Freq: Once | INTRAMUSCULAR | Status: AC
  Administered 2019-05-13: 1000 ug via INTRAMUSCULAR

## 2019-05-13 NOTE — Progress Notes (Signed)
SUBJECTIVE:  24 y.o. here for follow-up weight loss visit, previously seen 4 weeks ago. Denies any concerns and feels like medication is working but she went away this past week and gained a little wt. She state she is concerned about pregnancy so she stopped the phentermine for the past 2 wks. She states her period was late and she had a light positive test but then her period came last week. She would like a blood test. She will hold on taking the medication until she gets results back. She will return to her normal diet and exercise plan to get back on track with wt loss.   OBJECTIVE:  BP 117/84   Pulse 96   Wt 202 lb 9 oz (91.9 kg)   BMI 31.73 kg/m   Body mass index is 31.73 kg/m.  Waist circumference 38 inches Patient appears well. Denies any side effects ASSESSMENT:  Obesity- responding well to weight loss plan PLAN:  To continue with current medications. B12 101mcg/ml injection given RTC in 4 weeks as planned  Will follow up with lab results.   Reviewed [facility] Data base PMP Aware, low risk score 190, not on other controlled substances.   Doreene Burke, CNM

## 2019-05-13 NOTE — Patient Instructions (Signed)

## 2019-05-14 LAB — BETA HCG QUANT (REF LAB): hCG Quant: <1 m[IU]/mL

## 2019-06-17 ENCOUNTER — Other Ambulatory Visit: Payer: Self-pay

## 2019-06-17 ENCOUNTER — Encounter: Payer: Self-pay | Admitting: Certified Nurse Midwife

## 2019-06-17 ENCOUNTER — Ambulatory Visit (INDEPENDENT_AMBULATORY_CARE_PROVIDER_SITE_OTHER): Admitting: Certified Nurse Midwife

## 2019-06-17 ENCOUNTER — Encounter: Admitting: Certified Nurse Midwife

## 2019-06-17 VITALS — BP 144/94 | HR 94 | Ht 67.0 in | Wt 202.0 lb

## 2019-06-17 DIAGNOSIS — N910 Primary amenorrhea: Secondary | ICD-10-CM | POA: Diagnosis not present

## 2019-06-17 LAB — POCT URINE PREGNANCY: Preg Test, Ur: POSITIVE — AB

## 2019-06-17 MED ORDER — LABETALOL HCL 100 MG PO TABS: 100.0000 mg | ORAL_TABLET | Freq: Two times a day (BID) | ORAL | 3 refills | Status: DC

## 2019-06-17 NOTE — Progress Notes (Signed)
Subjective:    Sydney James is a 24 y.o. female who presents for evaluation of amenorrhea. She believes she could be pregnant. was not planned but she is keeping baby. She is anxious about pregnancy due to her previous pregnancy hx.  Sexual Activity: single partner, contraception: condoms. Current symptoms also include: fatigue. Last period was irregular.   Patient's last menstrual period was 04/26/2019 (approximate). The following portions of the patient's history were reviewed and updated as appropriate: allergies, current medications, past family history, past medical history, past social history, past surgical history and problem list.  Review of Systems Pertinent items are noted in HPI.     Objective:    BP (!) 144/94    Pulse 94    Ht 5\' 7"  (1.702 m)    Wt 202 lb (91.6 kg)    LMP 04/26/2019 (Approximate)    Breastfeeding No    BMI 31.64 kg/m  General: alert, cooperative, appears stated age, mild distress and no acute distress    Lab Review Urine HCG: positive    Assessment:    Absence of menstruation.     Plan:   Positive: EDC: unknown. Briefly discussed pre-natal care options.  Encouraged well-balanced diet, plenty of rest when needed, pre-natal vitamins daily and walking for exercise. Discussed self-help for nausea, avoiding OTC medications until consulting provider or pharmacist, other than Tylenol as needed, minimal caffeine (1-2 cups daily) and avoiding alcohol. She will schedule her u/s for dating in the next week, nurse visit @ 10 wks and initial OB visit 12-13 wks.. BP elevated today , has history of elevated bp PIH with last pregnancy and has been board[place] line chronic hypertension. Dr. 04/28/2019 consulted on plan of care pt started on Labetalol 100 mg BID. Discussed red flag symptoms.  Pt has blood pressure cuff at home. Instructions reviewed for BP monitoring She will my chart BP records. Has Follow up u/s for dating on Tuesday.  Feel free to call with any questions   Sunday, CNM

## 2019-06-17 NOTE — Patient Instructions (Signed)

## 2019-06-20 ENCOUNTER — Telehealth (INDEPENDENT_AMBULATORY_CARE_PROVIDER_SITE_OTHER)

## 2019-06-20 ENCOUNTER — Other Ambulatory Visit

## 2019-06-20 ENCOUNTER — Other Ambulatory Visit: Payer: Self-pay

## 2019-06-20 ENCOUNTER — Telehealth: Payer: Self-pay | Admitting: Certified Nurse Midwife

## 2019-06-20 DIAGNOSIS — R319 Hematuria, unspecified: Secondary | ICD-10-CM | POA: Diagnosis not present

## 2019-06-20 LAB — POCT URINALYSIS DIPSTICK
Bilirubin, UA: NEGATIVE
Glucose, UA: NEGATIVE
Ketones, UA: NEGATIVE
Leukocytes, UA: NEGATIVE
Nitrite, UA: NEGATIVE
Protein, UA: NEGATIVE
Spec Grav, UA: 1.01 (ref 1.010–1.025)
Urobilinogen, UA: 0.2 E.U./dL
pH, UA: 6 (ref 5.0–8.0)

## 2019-06-20 MED ORDER — NITROFURANTOIN MONOHYD MACRO 100 MG PO CAPS
100.0000 mg | ORAL_CAPSULE | Freq: Two times a day (BID) | ORAL | 1 refills | Status: DC
Start: 2019-06-20 — End: 2019-07-04

## 2019-06-20 NOTE — Telephone Encounter (Signed)
Called patient to let her know she can come in to drop off a urine sample. Informed patient nurse would be a at lunch between 12:00-1:30 and that we close at 5 pm today. Patient verbalized understanding.

## 2019-06-20 NOTE — Telephone Encounter (Signed)
Patient called in saying she is having uti symptoms. Patient would like to be seen today. Informed patient that her provider was out of the office today and tomorrow. Patient asked if she could see Marcelino Duster, informed patient Marcelino Duster was booked both today and tomorrow. Patient asked if she could drop off a urine sample or be squeezed in somewhere. Could you please advise?

## 2019-06-20 NOTE — Telephone Encounter (Signed)
Received a message from patient stating she thinks she has a UTI. Patient is pregnant. Patient was asked to come to the office to leave a urine specimen. Patient came to office and was c/o frequency, urgency and burning on urination. Sterile collection container given to patient with instructions for a clean catch urine sample. Specimen was dipped and sent for culture. Per [name] CNM macrobid sent to preferred pharmacy. Patient informed.

## 2019-06-22 LAB — URINE CULTURE

## 2019-06-24 ENCOUNTER — Other Ambulatory Visit: Payer: Self-pay | Admitting: Certified Nurse Midwife

## 2019-06-25 ENCOUNTER — Other Ambulatory Visit: Payer: Self-pay

## 2019-06-25 ENCOUNTER — Ambulatory Visit (INDEPENDENT_AMBULATORY_CARE_PROVIDER_SITE_OTHER)

## 2019-06-25 ENCOUNTER — Other Ambulatory Visit: Payer: Self-pay | Admitting: Obstetrics and Gynecology

## 2019-06-25 DIAGNOSIS — Z789 Other specified health status: Secondary | ICD-10-CM

## 2019-06-25 DIAGNOSIS — Z3A01 Less than 8 weeks gestation of pregnancy: Secondary | ICD-10-CM | POA: Diagnosis not present

## 2019-07-01 ENCOUNTER — Other Ambulatory Visit: Payer: Self-pay

## 2019-07-01 ENCOUNTER — Encounter: Payer: Self-pay | Admitting: Certified Nurse Midwife

## 2019-07-01 ENCOUNTER — Ambulatory Visit (INDEPENDENT_AMBULATORY_CARE_PROVIDER_SITE_OTHER): Admitting: Certified Nurse Midwife

## 2019-07-01 VITALS — BP 104/71 | HR 70 | Wt 204.6 lb

## 2019-07-01 DIAGNOSIS — Z0130 Encounter for examination of blood pressure without abnormal findings: Secondary | ICD-10-CM

## 2019-07-01 NOTE — Progress Notes (Signed)
Pt placed on labetalol , BP's at home elevated. Presents today for BP check. BP 104/71. She states that they have been more consistently 120/70s since having u/s she feels less stressed. Will continue on current dose of labetalol . Follow up as scheduled for NOB exam .   Doreene Burke, CNM

## 2019-07-04 ENCOUNTER — Ambulatory Visit (INDEPENDENT_AMBULATORY_CARE_PROVIDER_SITE_OTHER)

## 2019-07-04 ENCOUNTER — Other Ambulatory Visit: Payer: Self-pay

## 2019-07-04 VITALS — BP 128/83 | HR 85 | Ht 67.0 in | Wt 204.9 lb

## 2019-07-04 DIAGNOSIS — Z0283 Encounter for blood-alcohol and blood-drug test: Secondary | ICD-10-CM

## 2019-07-04 DIAGNOSIS — Z113 Encounter for screening for infections with a predominantly sexual mode of transmission: Secondary | ICD-10-CM | POA: Diagnosis not present

## 2019-07-04 DIAGNOSIS — R638 Other symptoms and signs concerning food and fluid intake: Secondary | ICD-10-CM | POA: Diagnosis not present

## 2019-07-04 DIAGNOSIS — Z3481 Encounter for supervision of other normal pregnancy, first trimester: Secondary | ICD-10-CM | POA: Diagnosis not present

## 2019-07-04 NOTE — Progress Notes (Signed)
      Sydney James presents for NOB nurse intake visit. Pregnancy confirmation done at [facility], 06/17/2019, with AT.  G 6.  P 0232.  LMP 04/26/2019.  EDD 01/31/2020.  Ga [redacted]w[redacted]d. Pregnancy education material explained and given.  3 cats in the home. 3 dogs in the home.  NOB labs ordered. BMI greater than 30. TSH/HbgA1 ordered. HIV and drug screen explained and ordered. Genetic screening discussed. Genetic testing; Unsure. Pt to discuss genetic testing with provider. PNV encouraged. Pt to follow up with provider in 2 weeks for NOB physical.  FMLA and [facility] Financial Policy completed and signed by patient.

## 2019-07-04 NOTE — Patient Instructions (Signed)
WHAT OB PATIENTS CAN EXPECT   Confirmation of pregnancy and ultrasound ordered if medically indicated-[redacted] weeks gestation  New OB (NOB) intake with nurse and New OB (NOB) labs- [redacted] weeks gestation  New OB (NOB) physical examination with provider- 11/[redacted] weeks gestation  Flu vaccine-[redacted] weeks gestation  Anatomy scan-[redacted] weeks gestation  Glucose tolerance test, blood work to test for anemia, T-dap vaccine-[redacted] weeks gestation  Vaginal swabs/cultures-STD/Group B strep-[redacted] weeks gestation  Appointments every 4 weeks until 28 weeks  Every 2 weeks from 28 weeks until 36 weeks  Weekly visits from 36 weeks until delivery  How a Baby Grows During Pregnancy  Pregnancy begins when a female's sperm enters a female's egg (fertilization). Fertilization usually happens in one of the tubes (fallopian tubes) that connect the ovaries to the womb (uterus). The fertilized egg moves down the fallopian tube to the uterus. Once it reaches the uterus, it implants into the lining of the uterus and begins to grow. For the first 10 weeks, the fertilized egg is called an embryo. After 10 weeks, it is called a fetus. As the fetus continues to grow, it receives oxygen and nutrients through tissue (placenta) that grows to support the developing baby. The placenta is the life support system for the baby. It provides oxygen and nutrition and removes waste. Learning as much as you can about your pregnancy and how your baby is developing can help you enjoy the experience. It can also make you aware of when there might be a problem and when to ask questions. How long does a typical pregnancy last? A pregnancy usually lasts 280 days, or about 40 weeks. Pregnancy is divided into three periods of growth, also called trimesters:  First trimester: 0-12 weeks.  Second trimester: 13-27 weeks.  Third trimester: 28-40 weeks. The day when your baby is ready to be born (full term) is your estimated date of delivery. How does my baby  develop month by month? First month  The fertilized egg attaches to the inside of the uterus.  Some cells will form the placenta. Others will form the fetus.  The arms, legs, brain, spinal cord, lungs, and heart begin to develop.  At the end of the first month, the heart begins to beat. Second month  The bones, inner ear, eyelids, hands, and feet form.  The genitals develop.  By the end of 8 weeks, all major organs are developing. Third month  All of the internal organs are forming.  Teeth develop below the gums.  Bones and muscles begin to grow. The spine can flex.  The skin is transparent.  Fingernails and toenails begin to form.  Arms and legs continue to grow longer, and hands and feet develop.  The fetus is about 3 inches (7.6 cm) long. Fourth month  The placenta is completely formed.  The external sex organs, neck, outer ear, eyebrows, eyelids, and fingernails are formed.  The fetus can hear, swallow, and move its arms and legs.  The kidneys begin to produce urine.  The skin is covered with a white, waxy coating (vernix) and very fine hair (lanugo). Fifth month  The fetus moves around more and can be felt for the first time (quickening).  The fetus starts to sleep and wake up and may begin to suck its finger.  The nails grow to the end of the fingers.  The organ in the digestive system that makes bile (gallbladder) functions and helps to digest nutrients.  If your baby is a girl, eggs  are present in her ovaries. If your baby is a boy, testicles start to move down into his scrotum. Sixth month  The lungs are formed.  The eyes open. The brain continues to develop.  Your baby has fingerprints and toe prints. Your baby's hair grows thicker.  At the end of the second trimester, the fetus is about 9 inches (22.9 cm) long. Seventh month  The fetus kicks and stretches.  The eyes are developed enough to sense changes in light.  The hands can make a  grasping motion.  The fetus responds to sound. Eighth month  All organs and body systems are fully developed and functioning.  Bones harden, and taste buds develop. The fetus may hiccup.  Certain areas of the brain are still developing. The skull remains soft. Ninth month  The fetus gains about  lb (0.23 kg) each week.  The lungs are fully developed.  Patterns of sleep develop.  The fetus's head typically moves into a head-down position (vertex) in the uterus to prepare for birth.  The fetus weighs 6-9 lb (2.72-4.08 kg) and is 19-20 inches (48.26-50.8 cm) long. What can I do to have a healthy pregnancy and help my baby develop? General instructions  Take prenatal vitamins as directed by your health care provider. These include vitamins such as folic acid, iron, calcium, and vitamin D. They are important for healthy development.  Take medicines only as directed by your health care provider. Read labels and ask a pharmacist or your health care provider whether over-the-counter medicines, supplements, and prescription drugs are safe to take during pregnancy.  Keep all follow-up visits as directed by your health care provider. This is important. Follow-up visits include prenatal care and screening tests. How do I know if my baby is developing well? At each prenatal visit, your health care provider will do several different tests to check on your health and keep track of your baby's development. These include:  Fundal height and position. ? Your health care provider will measure your growing belly from your pubic bone to the top of the uterus using a tape measure. ? Your health care provider will also feel your belly to determine your baby's position.  Heartbeat. ? An ultrasound in the first trimester can confirm pregnancy and show a heartbeat, depending on how far along you are. ? Your health care provider will check your baby's heart rate at every prenatal visit.  Second  trimester ultrasound. ? This ultrasound checks your baby's development. It also may show your baby's gender. What should I do if I have concerns about my baby's development? Always talk with your health care provider about any concerns that you may have about your pregnancy and your baby. Summary  A pregnancy usually lasts 280 days, or about 40 weeks. Pregnancy is divided into three periods of growth, also called trimesters.  Your health care provider will monitor your baby's growth and development throughout your pregnancy.  Follow your health care provider's recommendations about taking prenatal vitamins and medicines during your pregnancy.  Talk with your health care provider if you have any concerns about your pregnancy or your developing baby. This information is not intended to replace advice given to you by your health care provider. Make sure you discuss any questions you have with your health care provider. Document Revised: 04/12/2018 Document Reviewed: 11/02/2016 Elsevier Patient Education  2020 Elsevier Inc. Commonly Asked Questions During Pregnancy  Cats: A parasite can be excreted in cat feces.  To avoid exposure   you need to have another person empty the little box.  If you must empty the litter box you will need to wear gloves.  Wash your hands after handling your cat.  This parasite can also be found in raw or undercooked meat so this should also be avoided.  Colds, Sore Throats, Flu: Please check your medication sheet to see what you can take for symptoms.  If your symptoms are unrelieved by these medications please call the office.  Dental Work: Most any dental work your dentist recommends is permitted.  X-rays should only be taken during the first trimester if absolutely necessary.  Your abdomen should be shielded with a lead apron during all x-rays.  Please notify your provider prior to receiving any x-rays.  Novocaine is fine; gas is not recommended.  If your dentist  requires a note from us prior to dental work please call the office and we will provide one for you.  Exercise: Exercise is an important part of staying healthy during your pregnancy.  You may continue most exercises you were accustomed to prior to pregnancy.  Later in your pregnancy you will most likely notice you have difficulty with activities requiring balance like riding a bicycle.  It is important that you listen to your body and avoid activities that put you at a higher risk of falling.  Adequate rest and staying well hydrated are a must!  If you have questions about the safety of specific activities ask your provider.    Exposure to Children with illness: Try to avoid obvious exposure; report any symptoms to us when noted,  If you have chicken pos, red measles or mumps, you should be immune to these diseases.   Please do not take any vaccines while pregnant unless you have checked with your OB provider.  Fetal Movement: After 28 weeks we recommend you do "kick counts" twice daily.  Lie or sit down in a calm quiet environment and count your baby movements "kicks".  You should feel your baby at least 10 times per hour.  If you have not felt 10 kicks within the first hour get up, walk around and have something sweet to eat or drink then repeat for an additional hour.  If count remains less than 10 per hour notify your provider.  Fumigating: Follow your pest control agent's advice as to how long to stay out of your home.  Ventilate the area well before re-entering.  Hemorrhoids:   Most over-the-counter preparations can be used during pregnancy.  Check your medication to see what is safe to use.  It is important to use a stool softener or fiber in your diet and to drink lots of liquids.  If hemorrhoids seem to be getting worse please call the office.   Hot Tubs:  Hot tubs Jacuzzis and saunas are not recommended while pregnant.  These increase your internal body temperature and should be  avoided.  Intercourse:  Sexual intercourse is safe during pregnancy as long as you are comfortable, unless otherwise advised by your provider.  Spotting may occur after intercourse; report any bright red bleeding that is heavier than spotting.  Labor:  If you know that you are in labor, please go to the hospital.  If you are unsure, please call the office and let us help you decide what to do.  Lifting, straining, etc:  If your job requires heavy lifting or straining please check with your provider for any limitations.  Generally, you should not lift items   heavier than that you can lift simply with your hands and arms (no back muscles)  Painting:  Paint fumes do not harm your pregnancy, but may make you ill and should be avoided if possible.  Latex or water based paints have less odor than oils.  Use adequate ventilation while painting.  Permanents & Hair Color:  Chemicals in hair dyes are not recommended as they cause increase hair dryness which can increase hair loss during pregnancy.  " Highlighting" and permanents are allowed.  Dye may be absorbed differently and permanents may not hold as well during pregnancy.  Sunbathing:  Use a sunscreen, as skin burns easily during pregnancy.  Drink plenty of fluids; avoid over heating.  Tanning Beds:  Because their possible side effects are still unknown, tanning beds are not recommended.  Ultrasound Scans:  Routine ultrasounds are performed at approximately 20 weeks.  You will be able to see your baby's general anatomy an if you would like to know the gender this can usually be determined as well.  If it is questionable when you conceived you may also receive an ultrasound early in your pregnancy for dating purposes.  Otherwise ultrasound exams are not routinely performed unless there is a medical necessity.  Although you can request a scan we ask that you pay for it when conducted because insurance does not cover " patient request" scans.  Work: If your  pregnancy proceeds without complications you may work until your due date, unless your physician or employer advises otherwise.  Round Ligament Pain/Pelvic Discomfort:  Sharp, shooting pains not associated with bleeding are fairly common, usually occurring in the second trimester of pregnancy.  They tend to be worse when standing up or when you remain standing for long periods of time.  These are the result of pressure of certain pelvic ligaments called "round ligaments".  Rest, Tylenol and heat seem to be the most effective relief.  As the womb and fetus grow, they rise out of the pelvis and the discomfort improves.  Please notify the office if your pain seems different than that described.  It may represent a more serious condition.  Common Medications Safe in Pregnancy  Acne:      Constipation:  Benzoyl Peroxide     Colace  Clindamycin      Dulcolax Suppository  Topica Erythromycin     Fibercon  Salicylic Acid      Metamucil         Miralax AVOID:        Senakot   Accutane    Cough:  Retin-A       Cough Drops  Tetracycline      Phenergan w/ Codeine if Rx  Minocycline      Robitussin (Plain & DM)  Antibiotics:     Crabs/Lice:  Ceclor       RID  Cephalosporins    AVOID:  E-Mycins      Kwell  Keflex  Macrobid/Macrodantin   Diarrhea:  Penicillin      Kao-Pectate  Zithromax      Imodium AD         PUSH FLUIDS AVOID:       Cipro     Fever:  Tetracycline      Tylenol (Regular or Extra  Minocycline       Strength)  Levaquin      Extra Strength-Do not          Exceed 8 tabs/24 hrs Caffeine:        <  200mg/day (equiv. To 1 cup of coffee or  approx. 3 12 oz sodas)         Gas: Cold/Hayfever:       Gas-X  Benadryl      Mylicon  Claritin       Phazyme  **Claritin-D        Chlor-Trimeton    Headaches:  Dimetapp      ASA-Free Excedrin  Drixoral-Non-Drowsy     Cold Compress  Mucinex (Guaifenasin)     Tylenol (Regular or Extra  Sudafed/Sudafed-12 Hour     Strength)  **Sudafed PE  Pseudoephedrine   Tylenol Cold & Sinus     Vicks Vapor Rub  Zyrtec  **AVOID if Problems With Blood Pressure         Heartburn: Avoid lying down for at least 1 hour after meals  Aciphex      Maalox     Rash:  Milk of Magnesia     Benadryl    Mylanta       1% Hydrocortisone Cream  Pepcid  Pepcid Complete   Sleep Aids:  Prevacid      Ambien   Prilosec       Benadryl  Rolaids       Chamomile Tea  Tums (Limit 4/day)     Unisom  Zantac       Tylenol PM         Warm milk-add vanilla or  Hemorrhoids:       Sugar for taste  Anusol/Anusol H.C.  (RX: Analapram 2.5%)  Sugar Substitutes:  Hydrocortisone OTC     Ok in moderation  Preparation H      Tucks        Vaseline lotion applied to tissue with wiping    Herpes:     Throat:  Acyclovir      Oragel  Famvir  Valtrex     Vaccines:         Flu Shot Leg Cramps:       *Gardasil  Benadryl      Hepatitis A         Hepatitis B Nasal Spray:       Pneumovax  Saline Nasal Spray     Polio Booster         Tetanus Nausea:       Tuberculosis test or PPD  Vitamin B6 25 mg TID   AVOID:    Dramamine      *Gardasil  Emetrol       Live Poliovirus  Ginger Root 250 mg QID    MMR (measles, mumps &  High Complex Carbs @ Bedtime    rebella)  Sea Bands-Accupressure    Varicella (Chickenpox)  Unisom 1/2 tab TID     *No known complications           If received before Pain:         Known pregnancy;   Darvocet       Resume series after  Lortab        Delivery  Percocet    Yeast:   Tramadol      Femstat  Tylenol 3      Gyne-lotrimin  Ultram       Monistat  Vicodin           MISC:         All Sunscreens           Hair Coloring/highlights          Insect   Repellant's          (Including DEET)         Mystic Tans

## 2019-07-05 LAB — RPR: RPR Ser Ql: NONREACTIVE

## 2019-07-05 LAB — URINALYSIS, ROUTINE W REFLEX MICROSCOPIC
Bilirubin, UA: NEGATIVE
Glucose, UA: NEGATIVE
Ketones, UA: NEGATIVE
Leukocytes,UA: NEGATIVE
Nitrite, UA: NEGATIVE
Protein,UA: NEGATIVE
RBC, UA: NEGATIVE
Specific Gravity, UA: 1.017 (ref 1.005–1.030)
Urobilinogen, Ur: 0.2 mg/dL (ref 0.2–1.0)
pH, UA: 7.5 (ref 5.0–7.5)

## 2019-07-05 LAB — DRUG PROFILE, UR, 9 DRUGS (LABCORP)
Amphetamines, Urine: NEGATIVE ng/mL
Barbiturate Quant, Ur: NEGATIVE ng/mL
Benzodiazepine Quant, Ur: NEGATIVE ng/mL
Cannabinoid Quant, Ur: NEGATIVE ng/mL
Cocaine (Metab.): NEGATIVE ng/mL
Methadone Screen, Urine: NEGATIVE ng/mL
Opiate Quant, Ur: NEGATIVE ng/mL
PCP Quant, Ur: NEGATIVE ng/mL
Propoxyphene: NEGATIVE ng/mL

## 2019-07-05 LAB — HEMOGLOBIN A1C
Est. average glucose Bld gHb Est-mCnc: 108 mg/dL
Hgb A1c MFr Bld: 5.4 % (ref 4.8–5.6)

## 2019-07-05 LAB — TOXOPLASMA ANTIBODIES- IGG AND  IGM
Toxoplasma Antibody- IgM: <3 AU/mL (ref 0.0–7.9)
Toxoplasma IgG Ratio: <3 IU/mL (ref 0.0–7.1)

## 2019-07-05 LAB — HEPATITIS B SURFACE ANTIGEN: Hepatitis B Surface Ag: NEGATIVE

## 2019-07-05 LAB — HIV ANTIBODY (ROUTINE TESTING W REFLEX): HIV Screen 4th Generation wRfx: NONREACTIVE

## 2019-07-05 LAB — RUBELLA SCREEN: Rubella Antibodies, IGG: 1.61 index (ref >0.99)

## 2019-07-05 LAB — TSH: TSH: 0.591 u[IU]/mL (ref 0.450–4.500)

## 2019-07-05 LAB — ABO AND RH: Rh Factor: POSITIVE

## 2019-07-05 LAB — NICOTINE SCREEN, URINE: Cotinine Ql Scrn, Ur: NEGATIVE ng/mL

## 2019-07-05 LAB — VARICELLA ZOSTER ANTIBODY, IGG: Varicella zoster IgG: 788 index (ref >165)

## 2019-07-06 LAB — GC/CHLAMYDIA PROBE AMP
Chlamydia trachomatis, NAA: NEGATIVE
Neisseria Gonorrhoeae by PCR: NEGATIVE

## 2019-07-07 LAB — URINE CULTURE, OB REFLEX

## 2019-07-07 LAB — CULTURE, OB URINE

## 2019-07-09 ENCOUNTER — Encounter

## 2019-07-18 ENCOUNTER — Ambulatory Visit
Admission: RE | Admit: 2019-07-18 | Discharge: 2019-07-18 | Disposition: A | Discharge status: Home / Self Care | Source: Ambulatory Visit | Attending: Obstetrics and Gynecology | Admitting: Obstetrics and Gynecology

## 2019-07-18 ENCOUNTER — Telehealth: Payer: Self-pay

## 2019-07-18 ENCOUNTER — Encounter: Payer: Self-pay | Admitting: Certified Nurse Midwife

## 2019-07-18 ENCOUNTER — Encounter: Payer: Self-pay | Admitting: Obstetrics and Gynecology

## 2019-07-18 ENCOUNTER — Other Ambulatory Visit: Payer: Self-pay

## 2019-07-18 ENCOUNTER — Encounter
Admission: RE | Disposition: A | Discharge status: Home / Self Care | Payer: Self-pay | Source: Ambulatory Visit | Attending: Obstetrics and Gynecology

## 2019-07-18 ENCOUNTER — Ambulatory Visit: Admitting: Anesthesiology

## 2019-07-18 ENCOUNTER — Ambulatory Visit (INDEPENDENT_AMBULATORY_CARE_PROVIDER_SITE_OTHER): Admitting: Certified Nurse Midwife

## 2019-07-18 ENCOUNTER — Ambulatory Visit (INDEPENDENT_AMBULATORY_CARE_PROVIDER_SITE_OTHER)

## 2019-07-18 ENCOUNTER — Other Ambulatory Visit
Admission: RE | Admit: 2019-07-18 | Discharge: 2019-07-18 | Disposition: A | Discharge status: Home / Self Care | Source: Ambulatory Visit | Attending: Obstetrics and Gynecology | Admitting: Obstetrics and Gynecology

## 2019-07-18 ENCOUNTER — Other Ambulatory Visit: Payer: Self-pay | Admitting: Certified Nurse Midwife

## 2019-07-18 ENCOUNTER — Telehealth: Payer: Self-pay | Admitting: Certified Nurse Midwife

## 2019-07-18 VITALS — BP 127/88 | HR 81 | Wt 206.1 lb

## 2019-07-18 DIAGNOSIS — R58 Hemorrhage, not elsewhere classified: Secondary | ICD-10-CM

## 2019-07-18 DIAGNOSIS — Z8249 Family history of ischemic heart disease and other diseases of the circulatory system: Secondary | ICD-10-CM | POA: Diagnosis not present

## 2019-07-18 DIAGNOSIS — O169 Unspecified maternal hypertension, unspecified trimester: Secondary | ICD-10-CM | POA: Insufficient documentation

## 2019-07-18 DIAGNOSIS — Z888 Allergy status to other drugs, medicaments and biological substances status: Secondary | ICD-10-CM | POA: Insufficient documentation

## 2019-07-18 DIAGNOSIS — Z3A09 9 weeks gestation of pregnancy: Secondary | ICD-10-CM | POA: Diagnosis not present

## 2019-07-18 DIAGNOSIS — O021 Missed abortion: Secondary | ICD-10-CM | POA: Insufficient documentation

## 2019-07-18 DIAGNOSIS — Z79899 Other long term (current) drug therapy: Secondary | ICD-10-CM | POA: Diagnosis not present

## 2019-07-18 DIAGNOSIS — N96 Recurrent pregnancy loss: Secondary | ICD-10-CM

## 2019-07-18 DIAGNOSIS — Z882 Allergy status to sulfonamides status: Secondary | ICD-10-CM | POA: Insufficient documentation

## 2019-07-18 DIAGNOSIS — Z20822 Contact with and (suspected) exposure to covid-19: Secondary | ICD-10-CM | POA: Insufficient documentation

## 2019-07-18 HISTORY — PX: DILATION AND EVACUATION: SHX1459

## 2019-07-18 LAB — TYPE AND SCREEN
ABO/RH(D): O POS
Antibody Screen: NEGATIVE

## 2019-07-18 LAB — SARS CORONAVIRUS 2 BY RT PCR (HOSPITAL ORDER, PERFORMED IN ~~LOC~~ HOSPITAL LAB): SARS Coronavirus 2: NEGATIVE

## 2019-07-18 SURGERY — DILATION AND EVACUATION, UTERUS
Anesthesia: General | Site: "Vagina "

## 2019-07-18 MED ORDER — LACTATED RINGERS IV SOLN: INTRAVENOUS | Status: DC

## 2019-07-18 MED ORDER — ONDANSETRON HCL 4 MG/2ML IJ SOLN
INTRAMUSCULAR | Status: DC | PRN
  Administered 2019-07-18: 4 mg via INTRAVENOUS

## 2019-07-18 MED ORDER — CEFAZOLIN SODIUM-DEXTROSE 2-4 GM/100ML-% IV SOLN
INTRAVENOUS | Status: AC
  Filled 2019-07-18: qty 100

## 2019-07-18 MED ORDER — DEXMEDETOMIDINE HCL IN NACL 200 MCG/50ML IV SOLN
INTRAVENOUS | Status: DC | PRN
  Administered 2019-07-18: 12 ug via INTRAVENOUS

## 2019-07-18 MED ORDER — LIDOCAINE HCL (PF) 1 % IJ SOLN
INTRAMUSCULAR | Status: AC
  Filled 2019-07-18: qty 30

## 2019-07-18 MED ORDER — LIDOCAINE HCL 1 % IJ SOLN
INTRAMUSCULAR | Status: DC | PRN
  Administered 2019-07-18: 10 mL

## 2019-07-18 MED ORDER — ONDANSETRON HCL 4 MG/2ML IJ SOLN
INTRAMUSCULAR | Status: AC
  Filled 2019-07-18: qty 2

## 2019-07-18 MED ORDER — DOXYCYCLINE HYCLATE 100 MG IV SOLR
200.0000 mg | INTRAVENOUS | Status: AC
  Administered 2019-07-18: 200 mg via INTRAVENOUS
  Filled 2019-07-18 (×2): qty 200

## 2019-07-18 MED ORDER — ACETAMINOPHEN 500 MG PO TABS
ORAL_TABLET | ORAL | Status: AC
  Administered 2019-07-18: 1000 mg via ORAL
  Filled 2019-07-18: qty 2

## 2019-07-18 MED ORDER — PROPOFOL 10 MG/ML IV BOLUS
INTRAVENOUS | Status: AC
  Filled 2019-07-18: qty 20

## 2019-07-18 MED ORDER — IBUPROFEN 600 MG PO TABS
600.0000 mg | ORAL_TABLET | Freq: Four times a day (QID) | ORAL | 1 refills | Status: DC | PRN
Start: 2019-07-18 — End: 2019-09-05

## 2019-07-18 MED ORDER — ACETAMINOPHEN 500 MG PO TABS: 1000.0000 mg | ORAL_TABLET | ORAL | Status: AC

## 2019-07-18 MED ORDER — POVIDONE-IODINE 10 % EX SWAB: 2.0000 "application " | Freq: Once | CUTANEOUS | Status: DC

## 2019-07-18 MED ORDER — GLYCOPYRROLATE 0.2 MG/ML IJ SOLN
INTRAMUSCULAR | Status: AC
  Filled 2019-07-18: qty 1

## 2019-07-18 MED ORDER — MIDAZOLAM HCL 5 MG/5ML IJ SOLN
INTRAMUSCULAR | Status: DC | PRN
  Administered 2019-07-18: 2 mg via INTRAVENOUS

## 2019-07-18 MED ORDER — ASPIRIN EC 81 MG PO TBEC
81.0000 mg | DELAYED_RELEASE_TABLET | Freq: Every day | ORAL | 2 refills | Status: DC
Start: 2019-07-18 — End: 2019-07-18

## 2019-07-18 MED ORDER — DEXAMETHASONE SODIUM PHOSPHATE 10 MG/ML IJ SOLN
INTRAMUSCULAR | Status: AC
  Filled 2019-07-18: qty 1

## 2019-07-18 MED ORDER — FENTANYL CITRATE (PF) 100 MCG/2ML IJ SOLN: 25.0000 ug | INTRAMUSCULAR | Status: DC | PRN

## 2019-07-18 MED ORDER — FENTANYL CITRATE (PF) 100 MCG/2ML IJ SOLN
INTRAMUSCULAR | Status: AC
  Filled 2019-07-18: qty 2

## 2019-07-18 MED ORDER — LIDOCAINE HCL (PF) 2 % IJ SOLN
INTRAMUSCULAR | Status: DC | PRN
  Administered 2019-07-18: 50 mg

## 2019-07-18 MED ORDER — FENTANYL CITRATE (PF) 100 MCG/2ML IJ SOLN
INTRAMUSCULAR | Status: DC | PRN
  Administered 2019-07-18 (×2): 50 ug via INTRAVENOUS

## 2019-07-18 MED ORDER — OXYCODONE HCL 5 MG PO TABS: 5.0000 mg | ORAL_TABLET | Freq: Once | ORAL | Status: DC | PRN

## 2019-07-18 MED ORDER — SCOPOLAMINE 1 MG/3DAYS TD PT72
MEDICATED_PATCH | TRANSDERMAL | Status: DC | PRN
  Administered 2019-07-18: 1 via TRANSDERMAL

## 2019-07-18 MED ORDER — PROPOFOL 10 MG/ML IV BOLUS
INTRAVENOUS | Status: DC | PRN
  Administered 2019-07-18: 200 mg via INTRAVENOUS

## 2019-07-18 MED ORDER — MIDAZOLAM HCL 2 MG/2ML IJ SOLN
INTRAMUSCULAR | Status: AC
  Filled 2019-07-18: qty 2

## 2019-07-18 MED ORDER — OXYCODONE HCL 5 MG/5ML PO SOLN: 5.0000 mg | Freq: Once | ORAL | Status: DC | PRN

## 2019-07-18 MED ORDER — GLYCOPYRROLATE 0.2 MG/ML IJ SOLN
INTRAMUSCULAR | Status: DC | PRN
  Administered 2019-07-18: .2 mg via INTRAVENOUS

## 2019-07-18 MED ORDER — DEXAMETHASONE SODIUM PHOSPHATE 10 MG/ML IJ SOLN
INTRAMUSCULAR | Status: DC | PRN
  Administered 2019-07-18: 10 mg via INTRAVENOUS

## 2019-07-18 MED ORDER — PROMETHAZINE HCL 25 MG/ML IJ SOLN: 6.2500 mg | INTRAMUSCULAR | Status: DC | PRN

## 2019-07-18 MED ORDER — SCOPOLAMINE 1 MG/3DAYS TD PT72
MEDICATED_PATCH | TRANSDERMAL | Status: AC
  Filled 2019-07-18: qty 1

## 2019-07-18 SURGICAL SUPPLY — 27 items
CATH ROBINSON RED A/P 16FR (CATHETERS) ×3 IMPLANT
COVER WAND RF STERILE (DRAPES) IMPLANT
DRSG TELFA 3X8 NADH (GAUZE/BANDAGES/DRESSINGS) ×2 IMPLANT
FILTER UTR ASPR SPEC (MISCELLANEOUS) ×2 IMPLANT
FLTR UTR ASPR SPEC (MISCELLANEOUS) ×2
GAUZE 4X4 16PLY RFD (DISPOSABLE) ×3 IMPLANT
GLOVE BIO SURGEON STRL SZ 6.5 (GLOVE) ×7 IMPLANT
GLOVE INDICATOR 7.0 STRL GRN (GLOVE) ×7 IMPLANT
GOWN STRL REUS W/ TWL LRG LVL3 (GOWN DISPOSABLE) ×2 IMPLANT
GOWN STRL REUS W/TWL LRG LVL3 (GOWN DISPOSABLE) ×1
KIT BERKELEY 1ST TRIMESTER 3/8 (MISCELLANEOUS) ×3 IMPLANT
KIT TURNOVER KIT A (KITS) ×3 IMPLANT
NDL HYPO 25X1 1.5 SAFETY (NEEDLE) ×1 IMPLANT
NDL SPNL 22GX5 LNG QUINC BK (NEEDLE) ×1 IMPLANT
NEEDLE HYPO 25X1 1.5 SAFETY (NEEDLE) ×2 IMPLANT
NEEDLE SPNL 22GX5 LNG QUINC BK (NEEDLE) ×2 IMPLANT
PACK DNC HYST (MISCELLANEOUS) ×3 IMPLANT
PAD DRESSING TELFA 3X8 NADH (GAUZE/BANDAGES/DRESSINGS) ×1 IMPLANT
PAD OB MATERNITY 4.3X12.25 (PERSONAL CARE ITEMS) ×3 IMPLANT
PAD PREP 24X41 OB/GYN DISP (PERSONAL CARE ITEMS) ×3 IMPLANT
SET BERKELEY SUCTION TUBING (SUCTIONS) ×3 IMPLANT
SOL PREP PVP 2OZ (MISCELLANEOUS) ×2
SOLUTION PREP PVP 2OZ (MISCELLANEOUS) ×2 IMPLANT
SYR 10ML LL (SYRINGE) ×3 IMPLANT
VACURETTE 10 RIGID CVD (CANNULA) ×2 IMPLANT
VACURETTE 12 RIGID CVD (CANNULA) IMPLANT
VACURETTE 8 RIGID CVD (CANNULA) ×2 IMPLANT

## 2019-07-18 NOTE — Op Note (Signed)
Procedure(s): DILATATION AND EVACUATION Procedure Note  Sydney James female 24 y.o. 07/18/2019  Indications: The patient is a 24 y.o. [phone] female with missed abortion at [redacted] weeks gestation.  History of recurrent miscarriages.    Pre-operative Diagnosis: Missed abortion  Post-operative Diagnosis: Same  Surgeon: Hildred Laser, MD  Assistants:  Dominic Pea, [facility] PA-S  Anesthesia: General endotracheal anesthesia  Findings: Uterus was posterior.  Uterus was sounded to 9 cm. Tissue consistent with products of conception was removed and sent to Pathology.  Procedure Details: The patient was seen in the Holding Room. The risks, benefits, complications, treatment options, and expected outcomes were discussed with the patient.  The patient concurred with the proposed plan, giving informed consent.  The site of surgery properly noted/marked. The patient was taken to the Operating Room, identified as Sydney James and the procedure verified as Procedure(s) (LRB): DILATATION AND EVACUATION (N/A). A Time Out was held and the above information confirmed.  She was then placed under general anesthesia without difficulty.  She was placed in the dorsal lithotomy position using candy cane stirrups, and was examined; A Betadine prep and drape was performed in sterile fashion.   A  Red Robinson catheter was used to drain the bladder of urine. A weighted speculum was placed into  the vagina and a single tooth tenaculum was applied to the anterior lip of the cervix. The cervix was gently dilated using Hank's Dilators to accommodate a 8 mm suction curette, that was gently advanced to the uterine fundus.  The suction device was then activated and the curette was slowly rotated to clear the uterine cavity of products of conception.  A sharp curettage with a serrated curette  was then performed to confirm complete emptying of the uterus. Minimal bleeding was encountered. The tenaculum was removed along with  all instruments  from the  vagina.   The patient was awakened, mobilized and taken to the recovery room in satisfactory condition.  The procedure was well-tolerated.  The patient will be discharged to home as per PACU criteria.  Routine postoperative instructions were given along with a prescription for analgesics.  She will follow up in the clinic in 1-2 weeks for postoperative evaluation.   Estimated Blood Loss:  200 ml      Drains: straight catheterization prior to procedure with 300 ml of clear urine         Total IV Fluids:  550 ml  Specimens: Products of conception         Implants: None         Complications:  None; patient tolerated the procedure well.         Disposition: PACU - hemodynamically stable.         Condition: stable   Hildred Laser, MD [facility]

## 2019-07-18 NOTE — Telephone Encounter (Signed)
Spoke with patient she will be here at 1230 today for an ultrasound.

## 2019-07-18 NOTE — Interval H&P Note (Signed)
History and Physical Interval Note:  07/18/2019 7:24 PM  Sydney James  has presented today for surgery, with the diagnosis of Missed AB.  The various methods of treatment have been discussed with the patient and family. After consideration of risks, benefits and other options for treatment, the patient has consented to  Procedure(s): DILATATION AND EVACUATION (N/A) as a surgical intervention.  The patient's history has been reviewed, patient examined, no change in status, stable for surgery.  I have reviewed the patient's chart and labs.  Questions were answered to the patient's satisfaction.  She does note that she desires to have a portion of the products of conception to have a cremation, and would also like to have genetic testing (Anora) on the POC's as well. Consent form signed.    Hildred Laser, MD [facility]

## 2019-07-18 NOTE — Patient Instructions (Addendum)
FACTS YOU SHOULD KNOW  About Early Pregnancy Loss  WHAT IS AN EARLY PREGNANCY LOSS?  Once the egg is fertilized with the sperm and begins to develop, it attaches to the lining of the uterus. This early pregnancy tissue may not develop into an embryo (the beginning stage of a baby). Sometimes an embryo does develop but does not continue to grow. These problems can be seen on ultrasound.   MANAGEMNT OF EARLY PREGNANCY LOSS: About 4 out of 100 (0.25%) women will have a pregnancy loss in her lifetime.  One in five pregnancies is found to be an early pregnancy loss.  There are 3 ways to care for an early pregnancy loss:   (1) Surgery, (2) Medicine, (3) Waiting for you to pass the pregnancy on your own. The decision as to how to proceed after being diagnosed with and early pregnancy loss is an individual one.  The decision can be made only after appropriate counseling.  You need to weigh the pros and cons of the 3 choices. Then you can make the choice that works for you.  SURGERY (D&E) . Procedure over in 1 day . Requires being put to sleep . Bleeding may be light . Possible problems during surgery, including injury to womb(uterus) . Care provider has more control Medicine (CYTOTEC) . The complete procedure may take days to weeks . No Surgery . Bleeding may be heavy at times . There may be drug side effects . Patient has more control Waiting . You may choose to wait, in which case your own body may complete the passing of the abnormal early pregnancy on its own in about 2-4 weeks . Your bleeding may be heavy at times . There is a small possibility that you may need surgery if the bleeding is too much or not all of the pregnancy has passed.  CYTOTEC MANAGEMENT Prostaglandins (cytotec) are the most widely used drug for this purpose. They cause the uterus to cramp and contract. You will place the medicine yourself inside your vagina in the privacy of your home. Empting of the uterus should occur  within 3 days but the process may continue for several weeks. The bleeding may seem heavy at times.  INSTRUCTIONS: Take all 4 tablets of cytotec (800mcg total) at one time. This will cause a lot of cramping, you may have bleeding, and pass tissue, then the cramping and bleeding should get better. If you do not pass the tissue, then you can take 4 more tablets of cytotec (800mcg total) 48 hours after your first dose.  You will come back to have your blood drawn to make sure the pregnancy hormones are dropping in 1 week. Please call us if you have any questions.   POSSIBLE SIDE EFFECTS FROM CYTOTEC . Nausea  Vomiting . Diarrhea Fever . Chills  Hot Flashes Side effects  from the process of the early pregnancy loss include: . Cramping  Bleeding . Headaches  Dizziness RISKS: This is a low risk procedure. Less than 1 in 100 women has a complication. An incomplete passage of the early pregnancy may occur. Also, hemorrhage (heavy bleeding) could happen.  Rarely the pregnancy will not be passed completely. Excessively heavy bleeding may occur.  Your doctor may need to perform surgery to empty the uterus (D&E). Afterwards: Everybody will feel differently after the early pregnancy loss completion. You may have soreness or cramps for a day or two. You may have soreness or cramps for day or two.  You may have   light bleeding for up to 2 weeks. You may be as active as you feel like being. If you have any of the following problems you may call [facility] at [phone]. . If you have pain that does not get better with pain medication . Bleeding that soaks through 2 thick full-sized sanitary pads in an hour . Cramps that last longer than 2 days . Foul smelling discharge . Fever above 100.4 degrees F Even if you do not have any of these symptoms, you should have a follow-up exam to make sure you are healing properly. Your next normal period will usually start again in 4-6 week after the loss. You can get pregnant  soon after the loss, so use birth control right away. Finally: Make sure all your questions are answered before during and after any procedure. Follow up with medical care and family planning methods.     

## 2019-07-18 NOTE — OR Nursing (Signed)
Pt given fresh tissue: products of conceptiont by Dr. Valentino Saxon. Dr. Yetta Barre in pathology also approved.

## 2019-07-18 NOTE — Addendum Note (Signed)
Addended by: Fabian November on: 07/18/2019 02:39 PM   Modules accepted: Orders, SmartSet

## 2019-07-18 NOTE — Telephone Encounter (Signed)
Sydney James, please call patient. [place], see if there is place for an emergency ultrasound today. Thanks, JML

## 2019-07-18 NOTE — H&P (Signed)
GYNECOLOGY PREOPERATIVE HISTORY AND PHYSICAL  Subjective:  Sydney James is a 24 y.o. [phone] here for surgical management of missed abortion.  No significant preoperative concerns.  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   Proposed surgery: Suction Dilation and Evacuation   Pertinent Gynecological History: Menses: not applicable, patient is pregnant Last pap: normal Date: 07/19/2017.    OB History  Gravida Para Term Preterm AB Living  6 2 0 2 3 2   SAB TAB Ectopic Multiple Live Births  3 0 0 0 2    # Outcome Date GA Lbr Len/2nd Weight Sex Delivery Anes PTL Lv  6 Current           5 Preterm 12/22/17 [redacted]w[redacted]d  4 lb 9.4 oz (2.08 kg) M CS-LTranv EPI, Spinal  LIV  4 SAB 09/2016          3 SAB 2018          2 Preterm 01/25/15 [redacted]w[redacted]d 03:30 / 01:45 6 lb 4.2 oz (2.84 kg) M Vag-Spont EPI  LIV  1 SAB 2011            Past Medical History:  Diagnosis Date  . Lumbar herniated disc   . Migraine   . Preeclampsia   . PVC's (premature ventricular contractions)   . Raynaud's disease   . Scoliosis    Back brace for 2 years  . Scoliosis     Past Surgical History:  Procedure Laterality Date  . CESAREAN SECTION N/A 12/22/2017   Procedure: CESAREAN SECTION;  Surgeon: 12/24/2017, MD;  Location: [facility] ORS;  Service: Obstetrics;  Laterality: N/A;  Female Born @ 2    . DILATION AND CURETTAGE OF UTERUS    . DILATION AND EVACUATION N/A 03/15/2016   Procedure: DILATATION AND EVACUATION;  Surgeon: 03/17/2016, MD;  Location: [facility] ORS;  Service: Gynecology;  Laterality: N/A;  . WISDOM TOOTH EXTRACTION      Family History  Problem Relation Age of Onset  . Hypertension Father   . Hypertension Mother   . Cancer Maternal Grandmother   . Cancer Maternal Grandfather   . Diabetes Maternal Grandfather   . Cancer Paternal Grandmother   . Diabetes Paternal Grandfather     Social History   Socioeconomic History  . Marital status: Married    Spouse  name: Not on file  . Number of children: Not on file  . Years of education: Not on file  . Highest education level: Not on file  Occupational History  . Not on file  Tobacco Use  . Smoking status: Never Smoker  . Smokeless tobacco: Never Used  Vaping Use  . Vaping Use: Never used  Substance and Sexual Activity  . Alcohol use: No  . Drug use: No  . Sexual activity: Yes    Birth control/protection: Condom  Other Topics Concern  . Not on file  Social History Narrative  . Not on file   Social Determinants of Health   Financial Resource Strain:   . Difficulty of Paying Living Expenses:   Food Insecurity:   . Worried About Herold Harms in the Last Year:   . Programme researcher, broadcasting/film/video in the Last Year:   Transportation Needs:   . Barista (Medical):   Freight forwarder Lack of Transportation (Non-Medical):   Physical Activity:   . Days of Exercise per Week:   . Minutes of Exercise per Session:   Stress:   .  Feeling of Stress :   Social Connections:   . Frequency of Communication with Friends and Family:   . Frequency of Social Gatherings with Friends and Family:   . Attends Religious Services:   . Active Member of Clubs or Organizations:   . Attends BankerClub or Organization Meetings:   [name] Kitchen. Marital Status:   Intimate Partner Violence:   . Fear of Current or Ex-Partner:   . Emotionally Abused:   [name] Kitchen. Physically Abused:   . Sexually Abused:    No current facility-administered medications on file prior to visit.   Current Outpatient Medications on File Prior to Visit  Medication Sig Dispense Refill  . ferrous sulfate 324 MG TBEC Take 324 mg by mouth.    . labetalol (NORMODYNE) 100 MG tablet Take 1 tablet (100 mg total) by mouth 2 (two) times daily. 60 tablet 3  . magnesium 30 MG tablet Take 30 mg by mouth 2 (two) times daily.    . potassium chloride (KLOR-CON) 8 MEQ tablet Take 8 mEq by mouth daily.    . Prenatal Vit-Fe Fumarate-FA (MULTIVITAMIN-PRENATAL) 27-0.8 MG TABS tablet Take 1  tablet by mouth daily at 12 noon.    . vitamin B-12 (CYANOCOBALAMIN) 500 MCG tablet Take 500 mcg by mouth daily.     Allergies  Allergen Reactions  . Aloe Hives  . Lubricants     Has to have water based lubricant. Allergy to non-water based lubricant  . Monistat [Miconazole]   . Sulfa Antibiotics Itching and Rash  . Tape Itching and Rash      Review of Systems Constitutional: No recent fever/chills/sweats Respiratory: No recent cough/bronchitis Cardiovascular: No chest pain Gastrointestinal: No recent nausea/vomiting/diarrhea Genitourinary: No UTI symptoms Hematologic/lymphatic:No history of coagulopathy or recent blood thinner use    Objective:   Blood pressure 127/88, pulse 81, weight 206 lb 1 oz (93.5 kg), last menstrual period 04/26/2019, not currently breastfeeding. CONSTITUTIONAL: Well-developed, well-nourished female in no acute distress.  HENT:  Normocephalic, atraumatic, External right and left ear normal. Oropharynx is clear and moist EYES: Conjunctivae and EOM are normal. Pupils are equal, round, and reactive to light. No scleral icterus.  NECK: Normal range of motion, supple, no masses SKIN: Skin is warm and dry. No rash noted. Not diaphoretic. No erythema. No pallor. NEUROLOGIC: Alert and oriented to person, place, and time. Normal reflexes, muscle tone coordination. No cranial nerve deficit noted. PSYCHIATRIC: Normal mood and affect. Normal behavior. Normal judgment and thought content. CARDIOVASCULAR: Normal heart rate noted, regular rhythm RESPIRATORY: Effort and breath sounds normal, no problems with respiration noted ABDOMEN: Soft, nontender, nondistended. PELVIC: Deferred MUSCULOSKELETAL: Normal range of motion. No edema and no tenderness. 2+ distal pulses.    Labs: Results for orders placed or performed in visit on 07/04/19 (from the past 336 hour(s))  ABO AND RH    Collection Time: 07/04/19  3:58 PM  Result Value Ref Range   ABO Grouping O    Rh  Factor Positive   Hepatitis B surface antigen   Collection Time: 07/04/19  3:58 PM  Result Value Ref Range   Hepatitis B Surface Ag Negative Negative  Varicella zoster antibody, IgG   Collection Time: 07/04/19  3:58 PM  Result Value Ref Range   Varicella zoster IgG 788 Immune >165 index  Toxoplasma antibodies- IgG and  IgM   Collection Time: 07/04/19  3:58 PM  Result Value Ref Range   Toxoplasma IgG Ratio <3.0 0.0 - 7.1 IU/mL   Toxoplasma Antibody- IgM <3.0 0.0 -  7.9 AU/mL   Comment Comment   Rubella screen   Collection Time: 07/04/19  3:58 PM  Result Value Ref Range   Rubella Antibodies, IGG 1.61 Immune >0.99 index  RPR   Collection Time: 07/04/19  3:58 PM  Result Value Ref Range   RPR Ser Ql Non Reactive Non Reactive  HIV Antibody (routine testing w rflx)   Collection Time: 07/04/19  3:58 PM  Result Value Ref Range   HIV Screen 4th Generation wRfx Non Reactive Non Reactive  TSH   Collection Time: 07/04/19  3:58 PM  Result Value Ref Range   TSH 0.591 0.450 - 4.500 uIU/mL  Hemoglobin A1c   Collection Time: 07/04/19  3:58 PM  Result Value Ref Range   Hgb A1c MFr Bld 5.4 4.8 - 5.6 %   Est. average glucose Bld gHb Est-mCnc 108 mg/dL  Culture, OB Urine   Collection Time: 07/04/19  4:24 PM   Specimen: Urine   UR  Result Value Ref Range   Urine Culture, OB Final report   Urine Culture, OB Reflex   Collection Time: 07/04/19  4:24 PM  Result Value Ref Range   Organism ID, Bacteria Comment   GC/Chlamydia Probe Amp   Collection Time: 07/04/19  4:25 PM   Specimen: Urine   UR  Result Value Ref Range   Chlamydia trachomatis, NAA Negative Negative   Neisseria Gonorrhoeae by PCR Negative Negative  Urinalysis, Routine w reflex microscopic   Collection Time: 07/04/19  4:32 PM  Result Value Ref Range   Specific Gravity, UA 1.017 1.005 - 1.030   pH, UA 7.5 5.0 - 7.5   Color, UA Yellow Yellow   Appearance Ur Clear Clear   Leukocytes,UA Negative Negative   Protein,UA Negative  Negative/Trace   Glucose, UA Negative Negative   Ketones, UA Negative Negative   RBC, UA Negative Negative   Bilirubin, UA Negative Negative   Urobilinogen, Ur 0.2 0.2 - 1.0 mg/dL   Nitrite, UA Negative Negative   Microscopic Examination Comment   Urine drug profile, 9 drugs (performed at [facility])   Collection Time: 07/04/19  4:34 PM  Result Value Ref Range   Amphetamines, Urine Negative Cutoff=1000 ng/mL   Barbiturate Quant, Ur Negative Cutoff=300 ng/mL   Benzodiazepine Quant, Ur Negative Cutoff=300 ng/mL   Cannabinoid Quant, Ur Negative Cutoff=50 ng/mL   Cocaine (Metab.) Negative Cutoff=300 ng/mL   Opiate Quant, Ur Negative Cutoff=300 ng/mL   PCP Quant, Ur Negative Cutoff=25 ng/mL   Methadone Screen, Urine Negative Cutoff=300 ng/mL   Propoxyphene Negative Cutoff=300 ng/mL  Nicotine screen, urine   Collection Time: 07/04/19  4:34 PM  Result Value Ref Range   Cotinine Ql Scrn, Ur Negative Cutoff=300 ng/mL   Drug Screen Comment: Comment      Imaging Studies: US OB Comp Less 14 Wks  Result Date: 07/01/2019 Patient Name: Vanya Carberry DOB: 1995-04-18 MRN: [id number] ULTRASOUND REPORT Location: [facility] Date of Service: 06/25/2019 Indications:dating Findings: Mason Jim intrauterine pregnancy is visualized with a CRL consistent with [redacted]w[redacted]d gestation, giving an (U/S) EDD of 02/07/2020. FHR: 147 BPM CRL measurement: 13.1 mm Yolk sac is visualized and appears normal and early anatomy is normal. Amnion: visualized and appears normal Right Ovary is normal in appearance. Left Ovary is normal appearance. Corpus luteal cyst:  Left ovary Survey of the adnexa demonstrates no adnexal masses. There is no free peritoneal fluid in the cul de sac. Impression: 1. [redacted]w[redacted]d Viable Singleton Intrauterine pregnancy by U/S. 2. (U/S) EDD is 02/04.2022. Recommendations: 1.Clinical  correlation with the patient's History and Physical Exam. Jenine M. Marciano Sequin     RDMS I have reviewed this study and agree with  documented findings. Hildred Laser, MD [facility]    Patient Name: Enid Maultsby DOB: Jan 27, 1995 MRN: [id number] ULTRASOUND REPORT  Location: [facility] Date of Service: 07/18/2019   Indications:Threatened AB Findings:  Mason Jim intrauterine pregnancy is visualized with a CRL consistent with [redacted]w[redacted]d gestation, giving an (U/S) EDD of 02/19/2020. The (U/S) EDD is not consistent with the clinically established EDD of 02/12/2020.  FHR: neg  CRL measurement: 2.4 mm Yolk sac is visualized and appears normal and early anatomy is normal. Amnion: not visualized   Right Ovary is normal in appearance. Left Ovary is normal appearance. Corpus luteal cyst:  is not visualized Survey of the adnexa demonstrates no adnexal masses. There is no free peritoneal fluid in the cul de sac.  Impression: 1. [redacted]w[redacted]d Viable Singleton Intrauterine pregnancy by U/S. 2. (U/S) EDD is not  consistent with Clinically established EDD of 02/12/2020. 3. No FHT  Recommendations: 1.Clinical correlation with the patient's History and Physical Exam.  Jenine M. Marciano Sequin     RDMS  Assessment:    Missed abortion at [redacted] weeks gestation [facility], well controlled  Plan:    Counseling: Desires surgical management for miscarriage with D&E. Risks of surgery including bleeding, infection, injury to surrounding organs, need for additional procedures, possibility of intrauterine scarring which may impair future fertility, risk of retained products which may require further management and other postoperative/anesthesia complications have been explained to patient. Instructions reviewed, including NPO after midnight, and need for COVID screening.   Hildred Laser, MD [facility]

## 2019-07-18 NOTE — H&P (View-Only) (Signed)
GYNECOLOGY PREOPERATIVE HISTORY AND PHYSICAL  Subjective:  Sydney James is a 24 y.o. [phone] here for surgical management of missed abortion.  No significant preoperative concerns.  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   Proposed surgery: Suction Dilation and Evacuation   Pertinent Gynecological History: Menses: not applicable, patient is pregnant Last pap: normal Date: 07/19/2017.    OB History  Gravida Para Term Preterm AB Living  6 2 0 2 3 2   SAB TAB Ectopic Multiple Live Births  3 0 0 0 2    # Outcome Date GA Lbr Len/2nd Weight Sex Delivery Anes PTL Lv  6 Current           5 Preterm 12/22/17 [redacted]w[redacted]d  4 lb 9.4 oz (2.08 kg) M CS-LTranv EPI, Spinal  LIV  4 SAB 09/2016          3 SAB 2018          2 Preterm 01/25/15 [redacted]w[redacted]d 03:30 / 01:45 6 lb 4.2 oz (2.84 kg) M Vag-Spont EPI  LIV  1 SAB 2011            Past Medical History:  Diagnosis Date  . Lumbar herniated disc   . Migraine   . Preeclampsia   . PVC's (premature ventricular contractions)   . Raynaud's disease   . Scoliosis    Back brace for 2 years  . Scoliosis     Past Surgical History:  Procedure Laterality Date  . CESAREAN SECTION N/A 12/22/2017   Procedure: CESAREAN SECTION;  Surgeon: 12/24/2017, MD;  Location: [facility];  Service: Obstetrics;  Laterality: N/A;  Female Born @ 2    . DILATION AND CURETTAGE OF UTERUS    . DILATION AND EVACUATION N/A 03/15/2016   Procedure: DILATATION AND EVACUATION;  Surgeon: 03/17/2016, MD;  Location: [facility];  Service: Gynecology;  Laterality: N/A;  . WISDOM TOOTH EXTRACTION      Family History  Problem Relation Age of Onset  . Hypertension Father   . Hypertension Mother   . Cancer Maternal Grandmother   . Cancer Maternal Grandfather   . Diabetes Maternal Grandfather   . Cancer Paternal Grandmother   . Diabetes Paternal Grandfather     Social History   Socioeconomic History  . Marital status: Married    Spouse  name: Not on file  . Number of children: Not on file  . Years of education: Not on file  . Highest education level: Not on file  Occupational History  . Not on file  Tobacco Use  . Smoking status: Never Smoker  . Smokeless tobacco: Never Used  Vaping Use  . Vaping Use: Never used  Substance and Sexual Activity  . Alcohol use: No  . Drug use: No  . Sexual activity: Yes    Birth control/protection: Condom  Other Topics Concern  . Not on file  Social History Narrative  . Not on file   Social Determinants of Health   Financial Resource Strain:   . Difficulty of Paying Living Expenses:   Food Insecurity:   . Worried About Herold Harms in the Last Year:   . Programme researcher, broadcasting/film/video in the Last Year:   Transportation Needs:   . Barista (Medical):   Freight forwarder Lack of Transportation (Non-Medical):   Physical Activity:   . Days of Exercise per Week:   . Minutes of Exercise per Session:   Stress:   .  Feeling of Stress :   Social Connections:   . Frequency of Communication with Friends and Family:   . Frequency of Social Gatherings with Friends and Family:   . Attends Religious Services:   . Active Member of Clubs or Organizations:   . Attends Club or Organization Meetings:   . Marital Status:   Intimate Partner Violence:   . Fear of Current or Ex-Partner:   . Emotionally Abused:   . Physically Abused:   . Sexually Abused:    No current facility-administered medications on file prior to visit.   Current Outpatient Medications on File Prior to Visit  Medication Sig Dispense Refill  . ferrous sulfate 324 MG TBEC Take 324 mg by mouth.    . labetalol (NORMODYNE) 100 MG tablet Take 1 tablet (100 mg total) by mouth 2 (two) times daily. 60 tablet 3  . magnesium 30 MG tablet Take 30 mg by mouth 2 (two) times daily.    . potassium chloride (KLOR-CON) 8 MEQ tablet Take 8 mEq by mouth daily.    . Prenatal Vit-Fe Fumarate-FA (MULTIVITAMIN-PRENATAL) 27-0.8 MG TABS tablet Take 1  tablet by mouth daily at 12 noon.    . vitamin B-12 (CYANOCOBALAMIN) 500 MCG tablet Take 500 mcg by mouth daily.     Allergies  Allergen Reactions  . Aloe Hives  . Lubricants     Has to have water based lubricant. Allergy to non-water based lubricant  . Monistat [Miconazole]   . Sulfa Antibiotics Itching and Rash  . Tape Itching and Rash      Review of Systems Constitutional: No recent fever/chills/sweats Respiratory: No recent cough/bronchitis Cardiovascular: No chest pain Gastrointestinal: No recent nausea/vomiting/diarrhea Genitourinary: No UTI symptoms Hematologic/lymphatic:No history of coagulopathy or recent blood thinner use    Objective:   Blood pressure 127/88, pulse 81, weight 206 lb 1 oz (93.5 kg), last menstrual period 04/26/2019, not currently breastfeeding. CONSTITUTIONAL: Well-developed, well-nourished female in no acute distress.  HENT:  Normocephalic, atraumatic, External right and left ear normal. Oropharynx is clear and moist EYES: Conjunctivae and EOM are normal. Pupils are equal, round, and reactive to light. No scleral icterus.  NECK: Normal range of motion, supple, no masses SKIN: Skin is warm and dry. No rash noted. Not diaphoretic. No erythema. No pallor. NEUROLOGIC: Alert and oriented to person, place, and time. Normal reflexes, muscle tone coordination. No cranial nerve deficit noted. PSYCHIATRIC: Normal mood and affect. Normal behavior. Normal judgment and thought content. CARDIOVASCULAR: Normal heart rate noted, regular rhythm RESPIRATORY: Effort and breath sounds normal, no problems with respiration noted ABDOMEN: Soft, nontender, nondistended. PELVIC: Deferred MUSCULOSKELETAL: Normal range of motion. No edema and no tenderness. 2+ distal pulses.    Labs: Results for orders placed or performed in visit on 07/04/19 (from the past 336 hour(s))  ABO AND RH    Collection Time: 07/04/19  3:58 PM  Result Value Ref Range   ABO Grouping O    Rh  Factor Positive   Hepatitis B surface antigen   Collection Time: 07/04/19  3:58 PM  Result Value Ref Range   Hepatitis B Surface Ag Negative Negative  Varicella zoster antibody, IgG   Collection Time: 07/04/19  3:58 PM  Result Value Ref Range   Varicella zoster IgG 788 Immune >165 index  Toxoplasma antibodies- IgG and  IgM   Collection Time: 07/04/19  3:58 PM  Result Value Ref Range   Toxoplasma IgG Ratio <3.0 0.0 - 7.1 IU/mL   Toxoplasma Antibody- IgM <3.0 0.0 -   7.9 AU/mL   Comment Comment   Rubella screen   Collection Time: 07/04/19  3:58 PM  Result Value Ref Range   Rubella Antibodies, IGG 1.61 Immune >0.99 index  RPR   Collection Time: 07/04/19  3:58 PM  Result Value Ref Range   RPR Ser Ql Non Reactive Non Reactive  HIV Antibody (routine testing w rflx)   Collection Time: 07/04/19  3:58 PM  Result Value Ref Range   HIV Screen 4th Generation wRfx Non Reactive Non Reactive  TSH   Collection Time: 07/04/19  3:58 PM  Result Value Ref Range   TSH 0.591 0.450 - 4.500 uIU/mL  Hemoglobin A1c   Collection Time: 07/04/19  3:58 PM  Result Value Ref Range   Hgb A1c MFr Bld 5.4 4.8 - 5.6 %   Est. average glucose Bld gHb Est-mCnc 108 mg/dL  Culture, OB Urine   Collection Time: 07/04/19  4:24 PM   Specimen: Urine   UR  Result Value Ref Range   Urine Culture, OB Final report   Urine Culture, OB Reflex   Collection Time: 07/04/19  4:24 PM  Result Value Ref Range   Organism ID, Bacteria Comment   GC/Chlamydia Probe Amp   Collection Time: 07/04/19  4:25 PM   Specimen: Urine   UR  Result Value Ref Range   Chlamydia trachomatis, NAA Negative Negative   Neisseria Gonorrhoeae by PCR Negative Negative  Urinalysis, Routine w reflex microscopic   Collection Time: 07/04/19  4:32 PM  Result Value Ref Range   Specific Gravity, UA 1.017 1.005 - 1.030   pH, UA 7.5 5.0 - 7.5   Color, UA Yellow Yellow   Appearance Ur Clear Clear   Leukocytes,UA Negative Negative   Protein,UA Negative  Negative/Trace   Glucose, UA Negative Negative   Ketones, UA Negative Negative   RBC, UA Negative Negative   Bilirubin, UA Negative Negative   Urobilinogen, Ur 0.2 0.2 - 1.0 mg/dL   Nitrite, UA Negative Negative   Microscopic Examination Comment   Urine drug profile, 9 drugs (performed at [facility])   Collection Time: 07/04/19  4:34 PM  Result Value Ref Range   Amphetamines, Urine Negative Cutoff=1000 ng/mL   Barbiturate Quant, Ur Negative Cutoff=300 ng/mL   Benzodiazepine Quant, Ur Negative Cutoff=300 ng/mL   Cannabinoid Quant, Ur Negative Cutoff=50 ng/mL   Cocaine (Metab.) Negative Cutoff=300 ng/mL   Opiate Quant, Ur Negative Cutoff=300 ng/mL   PCP Quant, Ur Negative Cutoff=25 ng/mL   Methadone Screen, Urine Negative Cutoff=300 ng/mL   Propoxyphene Negative Cutoff=300 ng/mL  Nicotine screen, urine   Collection Time: 07/04/19  4:34 PM  Result Value Ref Range   Cotinine Ql Scrn, Ur Negative Cutoff=300 ng/mL   Drug Screen Comment: Comment      Imaging Studies: US OB Comp Less 14 Wks  Result Date: 07/01/2019 Patient Name: Vanya Carberry DOB: 1995-04-18 MRN: [id number] ULTRASOUND REPORT Location: [facility] Date of Service: 06/25/2019 Indications:dating Findings: Mason Jim intrauterine pregnancy is visualized with a CRL consistent with [redacted]w[redacted]d gestation, giving an (U/S) EDD of 02/07/2020. FHR: 147 BPM CRL measurement: 13.1 mm Yolk sac is visualized and appears normal and early anatomy is normal. Amnion: visualized and appears normal Right Ovary is normal in appearance. Left Ovary is normal appearance. Corpus luteal cyst:  Left ovary Survey of the adnexa demonstrates no adnexal masses. There is no free peritoneal fluid in the cul de sac. Impression: 1. [redacted]w[redacted]d Viable Singleton Intrauterine pregnancy by U/S. 2. (U/S) EDD is 02/04.2022. Recommendations: 1.Clinical  correlation with the patient's History and Physical Exam. Jenine M. Marciano Sequin     RDMS I have reviewed this study and agree with  documented findings. Hildred Laser, MD [facility]    Patient Name: Enid Maultsby DOB: Jan 27, 1995 MRN: [id number] ULTRASOUND REPORT  Location: [facility] Date of Service: 07/18/2019   Indications:Threatened AB Findings:  Mason Jim intrauterine pregnancy is visualized with a CRL consistent with [redacted]w[redacted]d gestation, giving an (U/S) EDD of 02/19/2020. The (U/S) EDD is not consistent with the clinically established EDD of 02/12/2020.  FHR: neg  CRL measurement: 2.4 mm Yolk sac is visualized and appears normal and early anatomy is normal. Amnion: not visualized   Right Ovary is normal in appearance. Left Ovary is normal appearance. Corpus luteal cyst:  is not visualized Survey of the adnexa demonstrates no adnexal masses. There is no free peritoneal fluid in the cul de sac.  Impression: 1. [redacted]w[redacted]d Viable Singleton Intrauterine pregnancy by U/S. 2. (U/S) EDD is not  consistent with Clinically established EDD of 02/12/2020. 3. No FHT  Recommendations: 1.Clinical correlation with the patient's History and Physical Exam.  Jenine M. Marciano Sequin     RDMS  Assessment:    Missed abortion at [redacted] weeks gestation [facility], well controlled  Plan:    Counseling: Desires surgical management for miscarriage with D&E. Risks of surgery including bleeding, infection, injury to surrounding organs, need for additional procedures, possibility of intrauterine scarring which may impair future fertility, risk of retained products which may require further management and other postoperative/anesthesia complications have been explained to patient. Instructions reviewed, including NPO after midnight, and need for COVID screening.   Hildred Laser, MD [facility]

## 2019-07-18 NOTE — Telephone Encounter (Signed)
Pt called in and stated that she is passing little blood clots. The pt is worried she is having a miscarriage. The pt said she has been cramping, she isnt anymore. The pt is requesting a call back. Please advise

## 2019-07-18 NOTE — Anesthesia Procedure Notes (Signed)
Procedure Name: LMA Insertion Performed by: Odessa Morren, CRNA Pre-anesthesia Checklist: Patient identified, Patient being monitored, Timeout performed, Emergency Drugs available and Suction available Patient Re-evaluated:Patient Re-evaluated prior to induction Oxygen Delivery Method: Circle system utilized Preoxygenation: Pre-oxygenation with 100% oxygen Induction Type: IV induction Ventilation: Mask ventilation without difficulty LMA: LMA inserted LMA Size: 4.0 Tube type: Oral Number of attempts: 1 Placement Confirmation: positive ETCO2 and breath sounds checked- equal and bilateral Tube secured with: Tape Dental Injury: Teeth and Oropharynx as per pre-operative assessment        

## 2019-07-18 NOTE — Anesthesia Postprocedure Evaluation (Signed)
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   Last Vitals:  Vitals:   07/18/19 2125 07/18/19 2130  BP:  109/86  Pulse: 63 67  Resp: 15 16  Temp:    SpO2: 97% 99%    Last Pain:  Vitals:   07/18/19 2130  TempSrc:   PainSc: 0-No pain                 Aurelio Brash Zionah Criswell

## 2019-07-18 NOTE — Anesthesia Preprocedure Evaluation (Addendum)
Anesthesia Evaluation  Patient identified by MRN, date of birth, ID band Patient awake    Reviewed: Allergy & Precautions, H&P , NPO status , Patient's Chart, lab work & pertinent test results  Airway Mallampati: II       Dental  (+) Teeth Intact   Pulmonary neg pulmonary ROS,    breath sounds clear to auscultation       Cardiovascular hypertension, (-) angina(-) Cardiac Stents  Rhythm:regular Rate:Normal     Neuro/Psych  Headaches, negative psych ROS   GI/Hepatic negative GI ROS, Neg liver ROS,   Endo/Other  negative endocrine ROS  Renal/GU      Musculoskeletal   Abdominal   Peds  Hematology negative hematology ROS (+)   Anesthesia Other Findings Missed AB  Past Medical History: No date: Lumbar herniated disc No date: Migraine No date: Preeclampsia No date: PVC's (premature ventricular contractions) No date: Raynaud's disease No date: Scoliosis     Comment:  Back brace for 2 years No date: Scoliosis  Past Surgical History: 12/22/2017: CESAREAN SECTION; N/A     Comment:  Procedure: CESAREAN SECTION;  Surgeon: Linzie Collin, MD;  Location: [facility];  Service: Obstetrics;                Laterality: N/A;  Female Born @ [phone]   No date: DILATION AND CURETTAGE OF UTERUS 03/15/2016: DILATION AND EVACUATION; N/A     Comment:  Procedure: DILATATION AND EVACUATION;  Surgeon: Herold Harms, MD;  Location: [facility];  Service:               Gynecology;  Laterality: N/A; No date: WISDOM TOOTH EXTRACTION     Reproductive/Obstetrics negative OB ROS                            Anesthesia Physical Anesthesia Plan  ASA: II  Anesthesia Plan: General LMA   Post-op Pain Management:    Induction:   PONV Risk Score and Plan: Dexamethasone, Ondansetron, Midazolam and Treatment may vary due to age or medical condition  Airway Management Planned:    Additional Equipment:   Intra-op Plan:   Post-operative Plan:   Informed Consent: I have reviewed the patients History and Physical, chart, labs and discussed the procedure including the risks, benefits and alternatives for the proposed anesthesia with the patient or authorized representative who has indicated his/her understanding and acceptance.     Dental Advisory Given  Plan Discussed with: Anesthesiologist, CRNA and Surgeon  Anesthesia Plan Comments:         Anesthesia Quick Evaluation

## 2019-07-18 NOTE — Discharge Instructions (Signed)
AMBULATORY SURGERY  DISCHARGE INSTRUCTIONS   1) The drugs that you were given will stay in your system until tomorrow so for the next 24 hours you should not:  A) Drive an automobile B) Make any legal decisions C) Drink any alcoholic beverage   2) You may resume regular meals tomorrow.  Today it is better to start with liquids and gradually work up to solid foods.  You may eat anything you prefer, but it is better to start with liquids, then soup and crackers, and gradually work up to solid foods.   3) Please notify your doctor immediately if you have any unusual bleeding, trouble breathing, redness and pain at the surgery site, drainage, fever, or pain not relieved by medication.    4) Additional Instructions:        Please contact your physician with any problems or Same Day Surgery at [phone], Monday through Friday 6 am to 4 pm, or  at [facility] number at [phone].Dilation and Curettage or Vacuum Curettage, Care After This sheet gives you information about how to care for yourself after your procedure. Your health care provider may also give you more specific instructions. If you have problems or questions, contact your health care provider. What can I expect after the procedure? After your procedure, it is common to have:  Mild pain or cramping.  Some vaginal bleeding or spotting. These may last for up to 2 weeks after your procedure. Follow these instructions at home: Activity   Do not drive or use heavy machinery while taking prescription pain medicine.  Avoid driving for the first 24 hours after your procedure.  Take frequent, short walks, followed by rest periods, throughout the day. Ask your health care provider what activities are safe for you. After 1-2 days, you may be able to return to your normal activities.  Do not lift anything heavier than 10 lb (4.5 kg) until your health care provider approves.  For at least 2 weeks, or as  long as told by your health care provider, do not: ? Douche. ? Use tampons. ? Have sexual intercourse. General instructions   Take over-the-counter and prescription medicines only as told by your health care provider. This is especially important if you take blood thinning medicine.  Do not take baths, swim, or use a hot tub until your health care provider approves. Take showers instead of baths.  Wear compression stockings as told by your health care provider. These stockings help to prevent blood clots and reduce swelling in your legs.  It is your responsibility to get the results of your procedure. Ask your health care provider, or the department performing the procedure, when your results will be ready.  Keep all follow-up visits as told by your health care provider. This is important. Contact a health care provider if:  You have severe cramps that get worse or that do not get better with medicine.  You have severe abdominal pain.  You cannot drink fluids without vomiting.  You develop pain in a different area of your pelvis.  You have bad-smelling vaginal discharge.  You have a rash. Get help right away if:  You have vaginal bleeding that soaks more than one sanitary pad in 1 hour, for 2 hours in a row.  You pass large blood clots from your vagina.  You have a fever that is above 100.49F (38.0C).  Your abdomen feels very tender or hard.  You have chest pain.  You have shortness of breath.  You cough up blood.  You feel dizzy or light-headed.  You faint.  You have pain in your neck or shoulder area. This information is not intended to replace advice given to you by your health care provider. Make sure you discuss any questions you have with your health care provider. Document Revised: 12/02/2016 Document Reviewed: 07/23/2015 Elsevier Patient Education  2020 Elsevier Inc.     Managing Pregnancy Loss Pregnancy loss can happen any time during a pregnancy.  Often the cause is not known. It is rarely because of anything you did. Pregnancy loss in early pregnancy (during the first trimester) is called a miscarriage. This type of pregnancy loss is the most common. Pregnancy loss that happens after 20 weeks of pregnancy is called fetal demise if the baby's heart stops beating before birth. Fetal demise is much less common. Some women experience spontaneous labor shortly after fetal demise resulting in a stillborn birth (stillbirth). Any pregnancy loss can be devastating. You will need to recover both physically and emotionally. Most women are able to get pregnant again after a pregnancy loss and deliver a healthy baby. How to manage emotional recovery  Pregnancy loss is very hard emotionally. You may feel many different emotions while you grieve. You may feel sad and angry. You may also feel guilty. It is normal to have periods of crying. Emotional recovery can take longer than physical recovery. It is different for everyone. Taking these steps can help you in managing this loss:  Remember that it is unlikely you did anything to cause the pregnancy loss.  Share your thoughts and feelings with friends, family, and your partner. Remember that your partner is also recovering emotionally.  Make sure you have a good support system. Do not spend too much time alone.  Meet with a pregnancy loss counselor or join a pregnancy loss support group.  Get enough sleep and eat a healthy diet. Return to regular exercise when you have recovered physically.  Do not use drugs or alcohol to manage your emotions.  Consider seeing a mental health professional to help you recover emotionally.  Ask a friend or loved one to help you decide what to do with any clothing and nursery items you received for your baby. In the case of a stillbirth, many women benefit from taking additional steps in the grieving process. You may want to:  Hold your baby after the birth.  Name  your baby.  Request a birth certificate.  Create a keepsake such as handprints or footprints.  Dress your baby and have a picture taken.  Make funeral arrangements.  Ask for a baptism or blessing. Hospitals have staff members who can help you with all these arrangements. How to recognize emotional stress It is normal to have emotional stress after a pregnancy loss. But emotional stress that lasts a long time or becomes severe requires treatment. Watch out for these signs of severe emotional stress:  Sadness, anger, or guilt that is not going away and is interfering with your normal activities.  Relationship problems that have occurred or gotten worse since the pregnancy loss.  Signs of depression that last longer than 2 weeks. These may include: ? Sadness. ? Anxiety. ? Hopelessness. ? Loss of interest in activities you enjoy. ? Inability to concentrate. ? Trouble sleeping or sleeping too much. ? Loss of appetite or overeating. ? Thoughts of death or of hurting yourself. Follow these instructions at home:  Take over-the-counter and prescription medicines only as told by your  health care provider.  Rest at home until your energy level returns. Return to your normal activities as told by your health care provider. Ask your health care provider what activities are safe for you.  When you are ready, meet with your health care provider to discuss steps to take for a future pregnancy.  Keep all follow-up visits as told by your health care provider. This is important. Where to find support  To help you and your partner with the process of grieving, talk with your health care provider or seek counseling.  Consider meeting with others who have experienced pregnancy loss. Ask your health care provider about support groups and resources. Where to find more information  [place] Department of Health and Human Services Office on Women's Health: www.womenshealth.gov  American Pregnancy  Association: www.americanpregnancy.org Contact a health care provider if:  You continue to experience grief, sadness, or lack of motivation for everyday activities, and those feelings do not improve over time.  You are struggling to recover emotionally, especially if you are using alcohol or substances to help. Get help right away if:  You have thoughts of hurting yourself or others. If you ever feel like you may hurt yourself or others, or have thoughts about taking your own life, get help right away. You can go to your nearest emergency department or call:  Your local emergency services (911 in the [place]  A suicide crisis helpline, such as the National Suicide Prevention Lifeline at 1-800-273-8255. This is open 24 hours a day. Summary  Any pregnancy loss can be difficult physically and emotionally.  You may experience many different emotions while you grieve. Emotional recovery can last longer than physical recovery.  It is normal to have emotional stress after a pregnancy loss. But emotional stress that lasts a long time or becomes severe requires treatment.  See your health care provider if you are struggling emotionally after a pregnancy loss. This information is not intended to replace advice given to you by your health care provider. Make sure you discuss any questions you have with your health care provider. Document Revised: 04/11/2018 Document Reviewed: 03/02/2017 Elsevier Patient Education  2020 Elsevier Inc.  

## 2019-07-18 NOTE — Transfer of Care (Signed)
Immediate Anesthesia Transfer of Care Note  Patient: Sydney James  Procedure(s) Performed: DILATATION AND EVACUATION (N/A Vagina )  Patient Location: PACU  Anesthesia Type:General  Level of Consciousness: sedated  Airway & Oxygen Therapy: Patient Spontanous Breathing and Patient connected to face mask oxygen  Post-op Assessment: Report given to RN and Post -op Vital signs reviewed and stable  Post vital signs: Reviewed  Last Vitals:  Vitals Value Taken Time  BP    Temp    Pulse    Resp    SpO2      Last Pain:  Vitals:   07/18/19 1553  TempSrc: Temporal  PainSc: 0-No pain         Complications: No complications documented.

## 2019-07-18 NOTE — Progress Notes (Signed)
GYN ENCOUNTER NOTE  Subjective:       Sydney James is a 24 y.o. [phone] female here for evaluation of bleeding in first trimester pregnancy.   Notes spotting starting last night with increased bleeding today with passage of small clots and tissue.   Denies difficulty breathing or respiratory distress, chest pain, abdominal pain, dysuria, and leg pain or swelling.   History significant for multiple miscarriage and pre-eclampsia-now chronic hypertension.    Gynecologic History  Patient's last menstrual period was 04/26/2019 (approximate).  Contraception: none  Last Pap: 07/2015. Results were: Negative/Negative  Obstetric History  OB History  Gravida Para Term Preterm AB Living  6 2 0 2 3 2   SAB TAB Ectopic Multiple Live Births  3 0 0 0 2    # Outcome Date GA Lbr Len/2nd Weight Sex Delivery Anes PTL Lv  6 Current           5 Preterm 12/22/17 [redacted]w[redacted]d  4 lb 9.4 oz (2.08 kg) M CS-LTranv EPI, Spinal  LIV  4 SAB 09/2016          3 SAB 2018          2 Preterm 01/25/15 [redacted]w[redacted]d 03:30 / 01:45 6 lb 4.2 oz (2.84 kg) M Vag-Spont EPI  LIV  1 SAB 2011            Past Medical History:  Diagnosis Date  . Lumbar herniated disc   . Migraine   . Preeclampsia   . PVC's (premature ventricular contractions)   . Raynaud's disease   . Scoliosis    Back brace for 2 years  . Scoliosis     Past Surgical History:  Procedure Laterality Date  . CESAREAN SECTION N/A 12/22/2017   Procedure: CESAREAN SECTION;  Surgeon: 12/24/2017, MD;  Location: [facility] ORS;  Service: Obstetrics;  Laterality: N/A;  Female Born @ 47    . DILATION AND CURETTAGE OF UTERUS    . DILATION AND EVACUATION N/A 03/15/2016   Procedure: DILATATION AND EVACUATION;  Surgeon: 03/17/2016, MD;  Location: [facility] ORS;  Service: Gynecology;  Laterality: N/A;  . WISDOM TOOTH EXTRACTION      Current Outpatient Medications on File Prior to Visit  Medication Sig Dispense Refill  . ferrous sulfate 324 MG TBEC Take 324  mg by mouth.    . labetalol (NORMODYNE) 100 MG tablet Take 1 tablet (100 mg total) by mouth 2 (two) times daily. 60 tablet 3  . magnesium 30 MG tablet Take 30 mg by mouth 2 (two) times daily.    . potassium chloride (KLOR-CON) 8 MEQ tablet Take 8 mEq by mouth daily.    . Prenatal Vit-Fe Fumarate-FA (MULTIVITAMIN-PRENATAL) 27-0.8 MG TABS tablet Take 1 tablet by mouth daily at 12 noon.    . vitamin B-12 (CYANOCOBALAMIN) 500 MCG tablet Take 500 mcg by mouth daily.     No current facility-administered medications on file prior to visit.    Allergies  Allergen Reactions  . Aloe Hives  . Lubricants     Has to have water based lubricant. Allergy to non-water based lubricant  . Monistat [Miconazole]   . Sulfa Antibiotics Itching and Rash  . Tape Itching and Rash    Social History   Socioeconomic History  . Marital status: Married    Spouse name: Not on file  . Number of children: Not on file  . Years of education: Not on file  . Highest education level: Not on file  Occupational History  .  Not on file  Tobacco Use  . Smoking status: Never Smoker  . Smokeless tobacco: Never Used  Vaping Use  . Vaping Use: Never used  Substance and Sexual Activity  . Alcohol use: No  . Drug use: No  . Sexual activity: Yes    Birth control/protection: Condom  Other Topics Concern  . Not on file  Social History Narrative  . Not on file   Social Determinants of Health   Financial Resource Strain:   . Difficulty of Paying Living Expenses:   Food Insecurity:   . Worried About Programme researcher, broadcasting/film/video in the Last Year:   . Barista in the Last Year:   Transportation Needs:   . Freight forwarder (Medical):   [name] Kitchen Lack of Transportation (Non-Medical):   Physical Activity:   . Days of Exercise per Week:   . Minutes of Exercise per Session:   Stress:   . Feeling of Stress :   Social Connections:   . Frequency of Communication with Friends and Family:   . Frequency of Social Gatherings  with Friends and Family:   . Attends Religious Services:   . Active Member of Clubs or Organizations:   . Attends Banker Meetings:   [name] Kitchen Marital Status:   Intimate Partner Violence:   . Fear of Current or Ex-Partner:   . Emotionally Abused:   [name] Kitchen Physically Abused:   . Sexually Abused:     Family History  Problem Relation Age of Onset  . Hypertension Father   . Hypertension Mother   . Cancer Maternal Grandmother   . Cancer Maternal Grandfather   . Diabetes Maternal Grandfather   . Cancer Paternal Grandmother   . Diabetes Paternal Grandfather     The following portions of the patient's history were reviewed and updated as appropriate: allergies, current medications, past family history, past medical history, past social history, past surgical history and problem list.  Review of Systems  ROS negative except as noted above. Information obtained from patient.   Objective:   BP 127/88   Pulse 81   Wt 206 lb 1 oz (93.5 kg)   LMP 04/26/2019 (Approximate)   BMI 32.27 kg/m    CONSTITUTIONAL: Well-developed, well-nourished female in no acute distress.   HEART: Normal rate and regular rhythm.  LUNGS: Normal respiratory effort, chest expands symmetrically. Lungs are clear to auscultation, no crackles or wheezes.  ABDOMEN: Soft, non-tender.  PELVIC EXAM: Deferred, see ultrasound below.   ULTRASOUND REPORT  Location: [facility] Date of Service: 07/18/2019   Indications:Threatened AB Findings:  Singleton intrauterine pregnancy is visualized with a CRL consistent with [redacted]w[redacted]d gestation, giving an (U/S) EDD of 02/19/2020. The (U/S) EDD is not consistent with the clinically established EDD of 02/12/2020.  FHR: neg  CRL measurement: 2.4 mm Yolk sac is visualized and appears normal and early anatomy is normal. Amnion: not visualized   Right Ovary is normal in appearance. Left Ovary is normal appearance. Corpus luteal cyst:  is not visualized Survey of the  adnexa demonstrates no adnexal masses. There is no free peritoneal fluid in the cul de sac.  Impression: 1. [redacted]w[redacted]d Viable Singleton Intrauterine pregnancy by U/S. 2. (U/S) EDD is not  consistent with Clinically established EDD of 02/12/2020. 3. No FHT  Recommendations: 1.Clinical correlation with the patient's History and Physical Exam.  Assessment:   1. Missed ab   Plan:   Findings reviewed with patient.   Condolences provided.   Advised baby aspirin  with subsequent pregnancies.   Desires D&E ASAP.   Plan of care discussed in detail with Dr. Valentino Saxon.   Report to Pre-Admit testing.    [name], CNM [facility], [facility] 07/18/19 1:41 PM

## 2019-07-19 ENCOUNTER — Encounter: Payer: Self-pay | Admitting: Obstetrics and Gynecology

## 2019-07-19 ENCOUNTER — Other Ambulatory Visit

## 2019-07-22 ENCOUNTER — Telehealth: Payer: Self-pay

## 2019-07-22 LAB — SURGICAL PATHOLOGY

## 2019-07-22 NOTE — Telephone Encounter (Signed)
PC to pt.  Stated she was aware of [place] [employer] issue.  Advised pt. That the water was found to be free of contamination when checked at [facility].  Advised she would be considered very low risk of contracting the infection.  Advised to watch for diarrhea, stomach cramps or nausea/ vomiting and to notify her PCP, if any of those symptoms occur.  Pt. Verb. Understanding.

## 2019-07-23 ENCOUNTER — Telehealth: Payer: Self-pay

## 2019-07-23 ENCOUNTER — Encounter: Admitting: Certified Nurse Midwife

## 2019-07-23 NOTE — Telephone Encounter (Signed)
mychart message sent to patient

## 2019-07-31 ENCOUNTER — Encounter: Payer: Self-pay | Admitting: Obstetrics and Gynecology

## 2019-07-31 ENCOUNTER — Ambulatory Visit (INDEPENDENT_AMBULATORY_CARE_PROVIDER_SITE_OTHER): Admitting: Obstetrics and Gynecology

## 2019-07-31 VITALS — BP 142/95 | HR 93 | Ht 67.0 in | Wt 208.8 lb

## 2019-07-31 DIAGNOSIS — Z4889 Encounter for other specified surgical aftercare: Secondary | ICD-10-CM

## 2019-07-31 DIAGNOSIS — O021 Missed abortion: Secondary | ICD-10-CM

## 2019-07-31 DIAGNOSIS — N96 Recurrent pregnancy loss: Secondary | ICD-10-CM

## 2019-07-31 DIAGNOSIS — Z9889 Other specified postprocedural states: Secondary | ICD-10-CM

## 2019-07-31 MED ORDER — NORETHINDRONE 0.35 MG PO TABS
1.0000 | ORAL_TABLET | Freq: Every day | ORAL | 3 refills | Status: DC
Start: 2019-07-31 — End: 2019-09-05

## 2019-07-31 NOTE — Progress Notes (Signed)
    OBSTETRICS/GYNECOLOGY POST-OPERATIVE CLINIC VISIT  Subjective:     Nakina Spatz is a 24 y.o. female who presents to the clinic 2 weeks status post D&E for missed abortion at [redacted] weeks gestation. Eating a regular diet without difficulty. Bowel movements are normal (with use of stool softeners). The patient is not having any pain.  Having occasional spotting but no heavy bleeding.   The following portions of the patient's history were reviewed and updated as appropriate: allergies, current medications, past family history, past medical history, past social history, past surgical history and problem list.  Review of Systems Pertinent items noted in HPI and remainder of comprehensive ROS otherwise negative.    Objective:    BP (!) 142/95   Pulse 93   Ht 5\' 7"  (1.702 m)   Wt (!) 208 lb 12.8 oz (94.7 kg)   LMP 04/26/2019 (Approximate)   BMI 32.70 kg/m  General:  alert and no distress  Abdomen: soft, bowel sounds active, non-tender  Pelvis:   deferred    Pathology:  A. PRODUCTS OF CONCEPTION; DILATATION AND EVACUATION:  - FETAL TISSUE, CHORIONIC VILLI, DECIDUA, AND GESTATIONAL ENDOMETRIUM.  - NEGATIVE FOR MALIGNANCY.   Assessment:   1. Missed abortion   2. Postoperative visit   3. S/P dilation and curettage   4. History of recurrent miscarriages      Plan:   1. Continue any current medications as needed.  2.  Patient notes that she and husband were able to cremate some of the remains and overall are doing fairly well.  3. Pathology report discussed.  Reviewed Anora genetic testing for current pregnancy, was normal female. Patient desires to know if further testing should be done due to her history of multiple miscarriages. Advised on carrier screening and assessment for thrombophilia. Should perform at least 1 month after miscarriage. Also would encourage initiation of baby aspirin at time of next pregnancy.  4. Activity restrictions: none 5. Anticipated return to work:  now. 6. Discussed  7.  Follow up: as needed   04/28/2019, MD [facility]

## 2019-07-31 NOTE — Progress Notes (Signed)
Pt present for post-op visit after miscarriage. Pt stated that she was doing well no problems.

## 2019-08-19 ENCOUNTER — Other Ambulatory Visit: Payer: Self-pay

## 2019-08-19 ENCOUNTER — Telehealth: Payer: Self-pay | Admitting: Certified Nurse Midwife

## 2019-08-19 NOTE — Telephone Encounter (Signed)
Patient is returning nurses phone call.  Could you please advise?

## 2019-08-19 NOTE — Telephone Encounter (Signed)
Spoke with patient- 3 sample packages of Slynd given to patient.

## 2019-08-20 ENCOUNTER — Encounter: Admitting: Certified Nurse Midwife

## 2019-09-02 ENCOUNTER — Other Ambulatory Visit: Payer: Self-pay

## 2019-09-05 ENCOUNTER — Encounter: Payer: Self-pay | Admitting: Nurse Practitioner

## 2019-09-05 ENCOUNTER — Other Ambulatory Visit: Payer: Self-pay

## 2019-09-05 ENCOUNTER — Telehealth (INDEPENDENT_AMBULATORY_CARE_PROVIDER_SITE_OTHER): Admitting: Nurse Practitioner

## 2019-09-05 VITALS — BP 143/97 | HR 72 | Temp 98.5°F | Ht 67.0 in | Wt 201.0 lb

## 2019-09-05 DIAGNOSIS — I10 Essential (primary) hypertension: Secondary | ICD-10-CM | POA: Insufficient documentation

## 2019-09-05 DIAGNOSIS — Z6831 Body mass index (BMI) 31.0-31.9, adult: Secondary | ICD-10-CM

## 2019-09-05 DIAGNOSIS — G479 Sleep disorder, unspecified: Secondary | ICD-10-CM | POA: Insufficient documentation

## 2019-09-05 DIAGNOSIS — E6609 Other obesity due to excess calories: Secondary | ICD-10-CM

## 2019-09-05 DIAGNOSIS — Z0000 Encounter for general adult medical examination without abnormal findings: Secondary | ICD-10-CM | POA: Diagnosis not present

## 2019-09-05 NOTE — Patient Instructions (Addendum)
Plan: Routine CPE laboratory studies to fill in gaps obtain next week at your convenience.   Please call to schedule a lab appt next week.  Also, I would like to see you in the office in about 2 weeks to check the BP. Please make an appt.  Trial of low-dose amlodipine 2.5 mg once a day.  Please check your blood pressure daily x3 days and monitor for any dizziness, lightheadedness, or low blood pressure.  If that occurs please stop the medication.  I am hoping with lifestyle changes, gradual weight loss, discontinuation of prednisone, and improvement in recent grief and stress, your blood pressure will return to normal without need for chronic medication.  A referral was placed to pulmonary for sleep apnea study.  If you do have sleep apnea, management of this will also help to bring down your blood pressure.    Hypertension, Adult High blood pressure (hypertension) is when the force of blood pumping through the arteries is too strong. The arteries are the blood vessels that carry blood from the heart throughout the body. Hypertension forces the heart to work harder to pump blood and may cause arteries to become narrow or stiff. Untreated or uncontrolled hypertension can cause a heart attack, heart failure, a stroke, kidney disease, and other problems. A blood pressure reading consists of a higher number over a lower number. Ideally, your blood pressure should be below 120/80. The first ("top") number is called the systolic pressure. It is a measure of the pressure in your arteries as your heart beats. The second ("bottom") number is called the diastolic pressure. It is a measure of the pressure in your arteries as the heart relaxes. What are the causes? The exact cause of this condition is not known. There are some conditions that result in or are related to high blood pressure. What increases the risk? Some risk factors for high blood pressure are under your control. The following factors may  make you more likely to develop this condition:  Smoking.  Having type 2 diabetes mellitus, high cholesterol, or both.  Not getting enough exercise or physical activity.  Being overweight.  Having too much fat, sugar, calories, or salt (sodium) in your diet.  Drinking too much alcohol. Some risk factors for high blood pressure may be difficult or impossible to change. Some of these factors include:  Having chronic kidney disease.  Having a family history of high blood pressure.  Age. Risk increases with age.  Race. You may be at higher risk if you are African American.  Gender. Men are at higher risk than women before age 54. After age 27, women are at higher risk than men.  Having obstructive sleep apnea.  Stress. What are the signs or symptoms? High blood pressure may not cause symptoms. Very high blood pressure (hypertensive crisis) may cause:  Headache.  Anxiety.  Shortness of breath.  Nosebleed.  Nausea and vomiting.  Vision changes.  Severe chest pain.  Seizures. How is this diagnosed? This condition is diagnosed by measuring your blood pressure while you are seated, with your arm resting on a flat surface, your legs uncrossed, and your feet flat on the floor. The cuff of the blood pressure monitor will be placed directly against the skin of your upper arm at the level of your heart. It should be measured at least twice using the same arm. Certain conditions can cause a difference in blood pressure between your right and left arms. Certain factors can cause blood  pressure readings to be lower or higher than normal for a short period of time:  When your blood pressure is higher when you are in a health care provider's office than when you are at home, this is called white coat hypertension. Most people with this condition do not need medicines.  When your blood pressure is higher at home than when you are in a health care provider's office, this is called  masked hypertension. Most people with this condition may need medicines to control blood pressure. If you have a high blood pressure reading during one visit or you have normal blood pressure with other risk factors, you may be asked to:  Return on a different day to have your blood pressure checked again.  Monitor your blood pressure at home for 1 week or longer. If you are diagnosed with hypertension, you may have other blood or imaging tests to help your health care provider understand your overall risk for other conditions. How is this treated? This condition is treated by making healthy lifestyle changes, such as eating healthy foods, exercising more, and reducing your alcohol intake. Your health care provider may prescribe medicine if lifestyle changes are not enough to get your blood pressure under control, and if:  Your systolic blood pressure is above 130.  Your diastolic blood pressure is above 80. Your personal target blood pressure may vary depending on your medical conditions, your age, and other factors. Follow these instructions at home: Eating and drinking   Eat a diet that is high in fiber and potassium, and low in sodium, added sugar, and fat. An example eating plan is called the DASH (Dietary Approaches to Stop Hypertension) diet. To eat this way: ? Eat plenty of fresh fruits and vegetables. Try to fill one half of your plate at each meal with fruits and vegetables. ? Eat whole grains, such as whole-wheat pasta, brown rice, or whole-grain bread. Fill about one fourth of your plate with whole grains. ? Eat or drink low-fat dairy products, such as skim milk or low-fat yogurt. ? Avoid fatty cuts of meat, processed or cured meats, and poultry with skin. Fill about one fourth of your plate with lean proteins, such as fish, chicken without skin, beans, eggs, or tofu. ? Avoid pre-made and processed foods. These tend to be higher in sodium, added sugar, and fat.  Reduce your daily  sodium intake. Most people with hypertension should eat less than 1,500 mg of sodium a day.  Do not drink alcohol if: ? Your health care provider tells you not to drink. ? You are pregnant, may be pregnant, or are planning to become pregnant.  If you drink alcohol: ? Limit how much you use to:  0-1 drink a day for women.  0-2 drinks a day for men. ? Be aware of how much alcohol is in your drink. In the [place] one drink equals one 12 oz bottle of beer (355 mL), one 5 oz glass of wine (148 mL), or one 1 oz glass of hard liquor (44 mL). Lifestyle   Work with your health care provider to maintain a healthy body weight or to lose weight. Ask what an ideal weight is for you.  Get at least 30 minutes of exercise most days of the week. Activities may include walking, swimming, or biking.  Include exercise to strengthen your muscles (resistance exercise), such as Pilates or lifting weights, as part of your weekly exercise routine. Try to do these types of exercises for  30 minutes at least 3 days a week.  Do not use any products that contain nicotine or tobacco, such as cigarettes, e-cigarettes, and chewing tobacco. If you need help quitting, ask your health care provider.  Monitor your blood pressure at home as told by your health care provider.  Keep all follow-up visits as told by your health care provider. This is important. Medicines  Take over-the-counter and prescription medicines only as told by your health care provider. Follow directions carefully. Blood pressure medicines must be taken as prescribed.  Do not skip doses of blood pressure medicine. Doing this puts you at risk for problems and can make the medicine less effective.  Ask your health care provider about side effects or reactions to medicines that you should watch for. Contact a health care provider if you:  Think you are having a reaction to a medicine you are taking.  Have headaches that keep coming back  (recurring).  Feel dizzy.  Have swelling in your ankles.  Have trouble with your vision. Get help right away if you:  Develop a severe headache or confusion.  Have unusual weakness or numbness.  Feel faint.  Have severe pain in your chest or abdomen.  Vomit repeatedly.  Have trouble breathing. Summary  Hypertension is when the force of blood pumping through your arteries is too strong. If this condition is not controlled, it may put you at risk for serious complications.  Your personal target blood pressure may vary depending on your medical conditions, your age, and other factors. For most people, a normal blood pressure is less than 120/80.  Hypertension is treated with lifestyle changes, medicines, or a combination of both. Lifestyle changes include losing weight, eating a healthy, low-sodium diet, exercising more, and limiting alcohol. This information is not intended to replace advice given to you by your health care provider. Make sure you discuss any questions you have with your health care provider. Document Revised: 08/30/2017 Document Reviewed: 08/30/2017 Elsevier Patient Education  2020 [employer].

## 2019-09-07 MED ORDER — AMLODIPINE BESYLATE 2.5 MG PO TABS: 2.5000 mg | ORAL_TABLET | Freq: Every day | ORAL | 0 refills | Status: DC

## 2019-09-07 NOTE — Progress Notes (Signed)
Virtual Visit via a virtual note  This visit type was conducted due to national recommendations for restrictions regarding the COVID-19 pandemic (e.g. social distancing).  This format is felt to be most appropriate for this patient at this time.  All issues noted in this document were discussed and addressed.  No physical exam was performed (except for noted visual exam findings with Video Visits).   I connected with@ on 09/07/19 at  9:00 AM EDT by a video enabled telemedicine application or telephone and verified that I am speaking with the correct person using two identifiers. Location patient: home Location provider: work or home office Persons participating in the virtual visit: patient, provider  I discussed the limitations, risks, security and privacy concerns of performing an evaluation and management service by telephone and the availability of in person appointments. I also discussed with the patient that there may be a patient responsible charge related to this service. The patient expressed understanding and agreed to proceed.   Reason for visit: Patient would like to establish care with a primary care provider.  She would like to discuss possible sleep apnea, hypertension since pregnancy 4 years ago monitoring with a blood pressure cuff at home.  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  HPI: This 24 year old patient reports that she was given labetalol while she was pregnant.  After delivery she came off the blood pressure medication.  She has been following her blood pressures at home and they range around 130/80 to 140/90.  She had a miscarriage and D&C in July.  Her mother just passed away from Covid complications 2 weeks ago.  She has been under stress.  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   Her PHQ is 9 today is 5, GAD-7 score is 9.  She does not feel like she needs to talk to a counselor at this time.  She also thinks  she may have sleep apnea.  She is always been a loud snoring, wakes up a lot at night, has morning headaches.  Her weight has gone from about a baseline of 190 pounds, to after pregnancy 248 pounds a year and a half ago and back down to 201 pounds.  She would like sleep apnea referral.  BMI 31.48/weight 201 pounds.  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  She is beginning to exercise again.  She reports she knows how and what to eat.  She is working actively on weight loss.  TMJ: She is on prednisone, have no signs of ear infection, feeling better.  Laboratory review: 07/04/2019:  A1c 5.4, TSH 0.59, HIV negative  ROS: See pertinent positives and negatives per HPI.  Past Medical History:  Diagnosis Date  . Lumbar herniated disc   . Migraine   . Preeclampsia   . PVC's (premature ventricular contractions)   . Raynaud's disease   . Scoliosis    Back brace for 2 years  . Scoliosis     Past Surgical History:  Procedure Laterality Date  . CESAREAN SECTION N/A 12/22/2017   Procedure: CESAREAN SECTION;  Surgeon: Linzie Collin, MD;  Location: [facility] ORS;  Service: Obstetrics;  Laterality: N/A;  Female Born @ 80    . DILATION AND CURETTAGE OF UTERUS    . DILATION AND EVACUATION N/A 03/15/2016   Procedure: DILATATION AND EVACUATION;  Surgeon: Herold Harms, MD;  Location: [facility] ORS;  Service: Gynecology;  Laterality: N/A;  . DILATION AND EVACUATION N/A 07/18/2019   Procedure: DILATATION AND EVACUATION;  Surgeon: Hildred Laser, MD;  Location: [facility] ORS;  Service: Gynecology;  Laterality: N/A;  . WISDOM TOOTH EXTRACTION      Family History  Problem Relation Age of Onset  . Hypertension Father   . Hypertension Mother   . Cancer Maternal Grandmother   . Cancer Maternal Grandfather   . Diabetes Maternal Grandfather   . Cancer Paternal Grandmother   . Diabetes Paternal Grandfather     SOCIAL HX: Never smoked   Current Outpatient  Medications:  .  Drospirenone (SLYND PO), Take by mouth., Disp: , Rfl:  .  ferrous sulfate 324 MG TBEC, Take 324 mg by mouth., Disp: , Rfl:  .  gabapentin (NEURONTIN) 100 MG capsule, Take 100 mg twice a day for one week, then increase to 200 mg(2 tablets) twice a day and continue, Disp: , Rfl:  .  predniSONE (STERAPRED UNI-PAK 21 TAB) 5 MG (21) TBPK tablet, Take by mouth., Disp: , Rfl:  .  Prenatal Vit-Fe Fumarate-FA (MULTIVITAMIN-PRENATAL) 27-0.8 MG TABS tablet, Take 1 tablet by mouth daily at 12 noon., Disp: , Rfl:  .  vitamin B-12 (CYANOCOBALAMIN) 500 MCG tablet, Take 500 mcg by mouth daily., Disp: , Rfl:   EXAM:  VITALS per patient if applicable: Weight 201 pounds, blood pressure 143/97 Home cough, pulse 72, temp 98.5  GENERAL: alert, oriented, appears well and in no acute distress  HEENT: atraumatic, conjunctiva clear, no obvious abnormalities on inspection of external nose and ears  NECK: normal movements of the head and neck  LUNGS: on inspection no signs of respiratory distress, breathing rate appears normal, no obvious gross SOB, gasping or wheezing  CV: no obvious cyanosis  MS: moves all visible extremities without noticeable abnormality  PSYCH/NEURO: pleasant and cooperative, no obvious depression or anxiety, speech and thought processing grossly intact  ASSESSMENT AND PLAN:  Discussed the following assessment and plan:  Encounter for medical examination to establish care - Plan: CBC with Differential/Platelet, Hepatitis C antibody, VITAMIN D 25 Hydroxy (Vit-D Deficiency, Fractures), B12 and Folate Panel  Essential hypertension - Plan: CBC with Differential/Platelet, Comprehensive metabolic panel  Sleep disturbance - Plan: Ambulatory referral to Pulmonology  Class 1 obesity due to excess calories with body mass index (BMI) of 31.0 to 31.9 in adult, unspecified whether serious comorbidity present - Plan: Lipid panel  Advised:    Plan: Routine CPE laboratory studies  to fill in gaps obtain next week at your convenience.   Please call to schedule a lab appt next week.  Also, I would like to see you in the office in about 2 weeks to check the BP. Please make an appt.  Trial of low-dose amlodipine 2.5 mg once a day.  Please check your blood pressure daily x3 days and monitor for any dizziness, lightheadedness, or low blood pressure.  If that occurs please stop the medication.  I am hoping with lifestyle changes, gradual weight loss, discontinuation of prednisone, and improvement in recent grief and stress, your blood pressure will return to normal without need for chronic medication.  A referral was placed to pulmonary for sleep apnea study.  If you do have sleep apnea, management of this will also help to bring down your blood pressure.  I discussed the assessment and treatment plan with the patient. The patient was provided an opportunity to ask questions and all were answered. The patient agreed with the plan and demonstrated an understanding  of the instructions.   The patient was advised to call back or seek an in-person evaluation if the symptoms worsen or if the condition fails to improve as anticipated.  I provided 30 minutes of non-face-to-face time during this encounter. Amedeo Kinsman, NP Adult Nurse Practitioner [facility] [employer] [phone]

## 2019-09-16 ENCOUNTER — Other Ambulatory Visit

## 2019-10-22 ENCOUNTER — Ambulatory Visit (INDEPENDENT_AMBULATORY_CARE_PROVIDER_SITE_OTHER): Admitting: Certified Nurse Midwife

## 2019-10-22 ENCOUNTER — Encounter: Payer: Self-pay | Admitting: Certified Nurse Midwife

## 2019-10-22 ENCOUNTER — Other Ambulatory Visit (HOSPITAL_COMMUNITY)
Admission: RE | Admit: 2019-10-22 | Discharge: 2019-10-22 | Disposition: A | Discharge status: Home / Self Care | Source: Ambulatory Visit | Attending: Certified Nurse Midwife | Admitting: Certified Nurse Midwife

## 2019-10-22 ENCOUNTER — Other Ambulatory Visit: Payer: Self-pay

## 2019-10-22 VITALS — BP 109/82 | HR 67 | Ht 67.0 in | Wt 207.3 lb

## 2019-10-22 DIAGNOSIS — R1032 Left lower quadrant pain: Secondary | ICD-10-CM | POA: Insufficient documentation

## 2019-10-22 NOTE — Patient Instructions (Signed)
Vaginitis Vaginitis is a condition in which the vaginal tissue swells and becomes red (inflamed). This condition is most often caused by a change in the normal balance of bacteria and yeast that live in the vagina. This change causes an overgrowth of certain bacteria or yeast, which causes the inflammation. There are different types of vaginitis, but the most common types are:  Bacterial vaginosis.  Yeast infection (candidiasis).  Trichomoniasis vaginitis. This is a sexually transmitted disease (STD).  Viral vaginitis.  Atrophic vaginitis.  Allergic vaginitis. What are the causes? The cause of this condition depends on the type of vaginitis. It can be caused by:  Bacteria (bacterial vaginosis).  Yeast, which is a fungus (yeast infection).  A parasite (trichomoniasis vaginitis).  A virus (viral vaginitis).  Low hormone levels (atrophic vaginitis). Low hormone levels can occur during pregnancy, breastfeeding, or after menopause.  Irritants, such as bubble baths, scented tampons, and feminine sprays (allergic vaginitis). Other factors can change the normal balance of the yeast and bacteria that live in the vagina. These include:  Antibiotic medicines.  Poor hygiene.  Diaphragms, vaginal sponges, spermicides, birth control pills, and intrauterine devices (IUD).  Sex.  Infection.  Uncontrolled diabetes.  A weakened defense (immune) system. What increases the risk? This condition is more likely to develop in women who:  Smoke.  Use vaginal douches, scented tampons, or scented sanitary pads.  Wear tight-fitting pants.  Wear thong underwear.  Use oral birth control pills or an IUD.  Have sex without a condom.  Have multiple sex partners.  Have an STD.  Frequently use the spermicide nonoxynol-9.  Eat lots of foods high in sugar.  Have uncontrolled diabetes.  Have low estrogen levels.  Have a weakened immune system from an immune disorder or medical  treatment.  Are pregnant or breastfeeding. What are the signs or symptoms? Symptoms vary depending on the cause of the vaginitis. Common symptoms include:  Abnormal vaginal discharge. ? The discharge is white, gray, or yellow with bacterial vaginosis. ? The discharge is thick, white, and cheesy with a yeast infection. ? The discharge is frothy and yellow or greenish with trichomoniasis.  A bad vaginal smell. The smell is fishy with bacterial vaginosis.  Vaginal itching, pain, or swelling.  Sex that is painful.  Pain or burning when urinating. Sometimes there are no symptoms. How is this diagnosed? This condition is diagnosed based on your symptoms and medical history. A physical exam, including a pelvic exam, will also be done. You may also have other tests, including:  Tests to determine the pH level (acidity or alkalinity) of your vagina.  A whiff test, to assess the odor that results when a sample of your vaginal discharge is mixed with a potassium hydroxide solution.  Tests of vaginal fluid. A sample will be examined under a microscope. How is this treated? Treatment varies depending on the type of vaginitis you have. Your treatment may include:  Antibiotic creams or pills to treat bacterial vaginosis and trichomoniasis.  Antifungal medicines, such as vaginal creams or suppositories, to treat a yeast infection.  Medicine to ease discomfort if you have viral vaginitis. Your sexual partner should also be treated.  Estrogen delivered in a cream, pill, suppository, or vaginal ring to treat atrophic vaginitis. If vaginal dryness occurs, lubricants and moisturizing creams may help. You may need to avoid scented soaps, sprays, or douches.  Stopping use of a product that is causing allergic vaginitis. Then using a vaginal cream to treat the symptoms. Follow   these instructions at home: Lifestyle  Keep your genital area clean and dry. Avoid soap, and only rinse the area with  water.  Do not douche or use tampons until your health care provider says it is okay to do so. Use sanitary pads, if needed.  Do not have sex until your health care provider approves. When you can return to sex, practice safe sex and use condoms.  Wipe from front to back. This avoids the spread of bacteria from the rectum to the vagina. General instructions  Take over-the-counter and prescription medicines only as told by your health care provider.  If you were prescribed an antibiotic medicine, take or use it as told by your health care provider. Do not stop taking or using the antibiotic even if you start to feel better.  Keep all follow-up visits as told by your health care provider. This is important. How is this prevented?  Use mild, non-scented products. Do not use things that can irritate the vagina, such as fabric softeners. Avoid the following products if they are scented: ? Feminine sprays. ? Detergents. ? Tampons. ? Feminine hygiene products. ? Soaps or bubble baths.  Let air reach your genital area. ? Wear cotton underwear to reduce moisture buildup. ? Avoid wearing underwear while you sleep. ? Avoid wearing tight pants and underwear or nylons without a cotton panel. ? Avoid wearing thong underwear.  Take off any wet clothing, such as bathing suits, as soon as possible.  Practice safe sex and use condoms. Contact a health care provider if:  You have abdominal pain.  You have a fever.  You have symptoms that last for more than 2-3 days. Get help right away if:  You have a fever and your symptoms suddenly get worse. Summary  Vaginitis is a condition in which the vaginal tissue becomes inflamed.This condition is most often caused by a change in the normal balance of bacteria and yeast that live in the vagina.  Treatment varies depending on the type of vaginitis you have.  Do not douche, use tampons , or have sex until your health care provider approves. When  you can return to sex, practice safe sex and use condoms. This information is not intended to replace advice given to you by your health care provider. Make sure you discuss any questions you have with your health care provider. Document Revised: 12/02/2016 Document Reviewed: 01/26/2016 Elsevier Patient Education  2020 [employer]  

## 2019-10-22 NOTE — Progress Notes (Signed)
GYN ENCOUNTER NOTE  Subjective:       Sydney James is a 24 y.o. [phone] female is here for gynecologic evaluation of the following issues:  1. Increased vaginal discharge with itching for the past few weeks. .   2. Left lower quadrant pain for the past few weeks, pt state she has had ovarian cysts in the past and this is what it felt like.    Gynecologic History Patient's last menstrual period was 10/10/2019 (exact date). Contraception: none Last Pap: 07/19/2017. Results were: normal Last mammogram: n/a  Obstetric History OB History  Gravida Para Term Preterm AB Living  6 2 0 2 4 2   SAB TAB Ectopic Multiple Live Births  3 0 0 0 2    # Outcome Date GA Lbr Len/2nd Weight Sex Delivery Anes PTL Lv  6 AB 07/18/19 [redacted]w[redacted]d    SAB     5 Preterm 12/22/17 [redacted]w[redacted]d  4 lb 9.4 oz (2.08 kg) M CS-LTranv EPI, Spinal  LIV  4 SAB 09/2016          3 SAB 2018          2 Preterm 01/25/15 [redacted]w[redacted]d 03:30 / 01:45 6 lb 4.2 oz (2.84 kg) M Vag-Spont EPI  LIV  1 SAB 2011            Obstetric Comments  G6- missed AB at [redacted] weeks gestation    Past Medical History:  Diagnosis Date  . Lumbar herniated disc   . Migraine   . Preeclampsia   . PVC's (premature ventricular contractions)   . Raynaud's disease   . Scoliosis    Back brace for 2 years  . Scoliosis     Past Surgical History:  Procedure Laterality Date  . CESAREAN SECTION N/A 12/22/2017   Procedure: CESAREAN SECTION;  Surgeon: 12/24/2017, MD;  Location: [facility];  Service: Obstetrics;  Laterality: N/A;  Female Born @ 40    . DILATION AND CURETTAGE OF UTERUS    . DILATION AND EVACUATION N/A 03/15/2016   Procedure: DILATATION AND EVACUATION;  Surgeon: 03/17/2016, MD;  Location: [facility];  Service: Gynecology;  Laterality: N/A;  . DILATION AND EVACUATION N/A 07/18/2019   Procedure: DILATATION AND EVACUATION;  Surgeon: 07/20/2019, MD;  Location: [facility];  Service: Gynecology;  Laterality: N/A;  . WISDOM TOOTH EXTRACTION       Current Outpatient Medications on File Prior to Visit  Medication Sig Dispense Refill  . amLODipine (NORVASC) 2.5 MG tablet Take 1 tablet (2.5 mg total) by mouth daily. 90 tablet 0  . ferrous sulfate 324 MG TBEC Take 324 mg by mouth.    . gabapentin (NEURONTIN) 100 MG capsule Take 100 mg twice a day for one week, then increase to 200 mg(2 tablets) twice a day and continue    . magnesium 30 MG tablet Take 30 mg by mouth 2 (two) times daily.    . Prenatal Vit-Fe Fumarate-FA (MULTIVITAMIN-PRENATAL) 27-0.8 MG TABS tablet Take 1 tablet by mouth daily at 12 noon.    . vitamin B-12 (CYANOCOBALAMIN) 500 MCG tablet Take 500 mcg by mouth daily.     No current facility-administered medications on file prior to visit.    Allergies  Allergen Reactions  . Aloe Hives  . Lubricants     Has to have water based lubricant. Allergy to non-water based lubricant  . Monistat [Miconazole]   . Sulfa Antibiotics Itching and Rash  . Tape Itching and Rash  Social History   Socioeconomic History  . Marital status: Married    Spouse name: Not on file  . Number of children: Not on file  . Years of education: Not on file  . Highest education level: Not on file  Occupational History  . Not on file  Tobacco Use  . Smoking status: Never Smoker  . Smokeless tobacco: Never Used  Vaping Use  . Vaping Use: Never used  Substance and Sexual Activity  . Alcohol use: No  . Drug use: No  . Sexual activity: Yes    Birth control/protection: Pill  Other Topics Concern  . Not on file  Social History Narrative  . Not on file   Social Determinants of Health   Financial Resource Strain:   . Difficulty of Paying Living Expenses: Not on file  Food Insecurity:   . Worried About Programme researcher, broadcasting/film/video in the Last Year: Not on file  . Ran Out of Food in the Last Year: Not on file  Transportation Needs:   . Lack of Transportation (Medical): Not on file  . Lack of Transportation (Non-Medical): Not on file   Physical Activity:   . Days of Exercise per Week: Not on file  . Minutes of Exercise per Session: Not on file  Stress:   . Feeling of Stress : Not on file  Social Connections:   . Frequency of Communication with Friends and Family: Not on file  . Frequency of Social Gatherings with Friends and Family: Not on file  . Attends Religious Services: Not on file  . Active Member of Clubs or Organizations: Not on file  . Attends Banker Meetings: Not on file  . Marital Status: Not on file  Intimate Partner Violence:   . Fear of Current or Ex-Partner: Not on file  . Emotionally Abused: Not on file  . Physically Abused: Not on file  . Sexually Abused: Not on file    Family History  Problem Relation Age of Onset  . Hypertension Father   . Hypertension Mother   . Cancer Maternal Grandmother   . Cancer Maternal Grandfather   . Diabetes Maternal Grandfather   . Cancer Paternal Grandmother   . Diabetes Paternal Grandfather     The following portions of the patient's history were reviewed and updated as appropriate: allergies, current medications, past family history, past medical history, past social history, past surgical history and problem list.  Review of Systems Review of Systems - Negative except as mentioned in HPI Review of Systems - General ROS: negative for - chills, fatigue, fever, hot flashes, malaise or night sweats Hematological and Lymphatic ROS: negative for - bleeding problems or swollen lymph nodes Gastrointestinal ROS: negative for - abdominal pain, blood in stools, change in bowel habits and nausea/vomiting Musculoskeletal ROS: negative for - joint pain, muscle pain or muscular weakness Genito-Urinary ROS: negative for - change in menstrual cycle, dysmenorrhea, dyspareunia, dysuria, genital discharge, genital ulcers, hematuria, incontinence, irregular/heavy menses, nocturia . Positive for vaginal discharge with itching and left lower pelvic pain  Objective:    BP 109/82   Pulse 67   Ht 5\' 7"  (1.702 m)   Wt 207 lb 5 oz (94 kg)   LMP 10/10/2019 (Exact Date)   BMI 32.47 kg/m  CONSTITUTIONAL: Well-developed, well-nourished female in no acute distress.  HENT:  Normocephalic, atraumatic.  NECK: Normal range of motion, supple, no masses.  Normal thyroid.  SKIN: Skin is warm and dry. No rash noted. Not diaphoretic.  No erythema. No pallor. NEUROLGIC: Alert and oriented to person, place, and time. PSYCHIATRIC: Normal mood and affect. Normal behavior. Normal judgment and thought content. CARDIOVASCULAR:Not Examined RESPIRATORY: Not Examined BREASTS: Not Examined ABDOMEN: Soft, non distended;  Tender-left lower quadrant.  No Organomegaly. PELVIC:  External Genitalia: Normal  BUS: Normal  Vagina: Normal, white particulate discharge noted  Cervix: Normal MUSCULOSKELETAL: Normal range of motion. No tenderness.  No cyanosis, clubbing, or edema.     Assessment:   1. Left lower quadrant pain - US PELVIC COMPLETE WITH TRANSVAGINAL; Future 2.Vaginal discharge, itching - . Cervicovaginal ancillary only     Plan:   Swab collected. Reviewed self help measures. Discussed ovarian cysts , and probability of recurrence , recommended treatment options and diagnosis. Ultrasound ordered. Will follow up with result.   Doreene Burke, CNM

## 2019-10-23 LAB — CERVICOVAGINAL ANCILLARY ONLY
Bacterial Vaginitis (gardnerella): NEGATIVE
Candida Glabrata: NEGATIVE
Candida Vaginitis: POSITIVE — AB
Comment: NEGATIVE
Comment: NEGATIVE
Comment: NEGATIVE

## 2019-10-24 ENCOUNTER — Other Ambulatory Visit: Payer: Self-pay | Admitting: Certified Nurse Midwife

## 2019-10-24 MED ORDER — FLUCONAZOLE 150 MG PO TABS: 150.0000 mg | ORAL_TABLET | Freq: Once | ORAL | 1 refills | Status: AC

## 2019-10-29 ENCOUNTER — Institutional Professional Consult (permissible substitution): Admitting: Pulmonary Disease

## 2019-10-29 NOTE — Progress Notes (Deleted)
10/30/19- 27 yoF married never smoker for sleep evaluation courtesy of Amedeo Kinsman, NP with concern for OSA. Medical problem list includes HTN, Lumbar Disc Disease, Obesity, Scoliosis,  Body weight today- Epsorth score- Covid vax- Flu vax-

## 2019-10-30 ENCOUNTER — Other Ambulatory Visit

## 2019-10-30 ENCOUNTER — Institutional Professional Consult (permissible substitution): Admitting: Internal Medicine

## 2019-11-01 ENCOUNTER — Ambulatory Visit (INDEPENDENT_AMBULATORY_CARE_PROVIDER_SITE_OTHER)

## 2019-11-01 ENCOUNTER — Other Ambulatory Visit: Payer: Self-pay

## 2019-11-01 DIAGNOSIS — R1032 Left lower quadrant pain: Secondary | ICD-10-CM | POA: Diagnosis not present

## 2019-11-22 ENCOUNTER — Other Ambulatory Visit: Payer: Self-pay

## 2019-11-22 ENCOUNTER — Other Ambulatory Visit (INDEPENDENT_AMBULATORY_CARE_PROVIDER_SITE_OTHER)

## 2019-11-22 DIAGNOSIS — Z0000 Encounter for general adult medical examination without abnormal findings: Secondary | ICD-10-CM

## 2019-11-22 DIAGNOSIS — I10 Essential (primary) hypertension: Secondary | ICD-10-CM | POA: Diagnosis not present

## 2019-11-22 DIAGNOSIS — Z6831 Body mass index (BMI) 31.0-31.9, adult: Secondary | ICD-10-CM

## 2019-11-22 DIAGNOSIS — E6609 Other obesity due to excess calories: Secondary | ICD-10-CM

## 2019-11-22 LAB — CBC WITH DIFFERENTIAL/PLATELET
Basophils Absolute: 0 10*3/uL (ref 0.0–0.1)
Basophils Relative: 0.5 % (ref 0.0–3.0)
Eosinophils Absolute: 0.2 10*3/uL (ref 0.0–0.7)
Eosinophils Relative: 2.7 % (ref 0.0–5.0)
HCT: 43.4 % (ref 36.0–46.0)
Hemoglobin: 14.5 g/dL (ref 12.0–15.0)
Lymphocytes Relative: 25.9 % (ref 12.0–46.0)
Lymphs Abs: 2.3 10*3/uL (ref 0.7–4.0)
MCHC: 33.3 g/dL (ref 30.0–36.0)
MCV: 86.8 fl (ref 78.0–100.0)
Monocytes Absolute: 0.4 10*3/uL (ref 0.1–1.0)
Monocytes Relative: 4.6 % (ref 3.0–12.0)
Neutro Abs: 5.9 10*3/uL (ref 1.4–7.7)
Neutrophils Relative %: 66.3 % (ref 43.0–77.0)
Platelets: 287 10*3/uL (ref 150.0–400.0)
RBC: 5 Mil/uL (ref 3.87–5.11)
RDW: 12.4 % (ref 11.5–15.5)
WBC: 8.9 10*3/uL (ref 4.0–10.5)

## 2019-11-22 LAB — COMPREHENSIVE METABOLIC PANEL
ALT: 12 U/L (ref 0–35)
AST: 12 U/L (ref 0–37)
Albumin: 4.2 g/dL (ref 3.5–5.2)
Alkaline Phosphatase: 61 U/L (ref 39–117)
BUN: 12 mg/dL (ref 6–23)
CO2: 24 mEq/L (ref 19–32)
Calcium: 8.9 mg/dL (ref 8.4–10.5)
Chloride: 103 mEq/L (ref 96–112)
Creatinine, Ser: 0.7 mg/dL (ref 0.40–1.20)
GFR: 119.66 mL/min (ref >60.00)
Glucose, Bld: 106 mg/dL — ABNORMAL HIGH (ref 70–99)
Potassium: 4 mEq/L (ref 3.5–5.1)
Sodium: 137 mEq/L (ref 135–145)
Total Bilirubin: 0.4 mg/dL (ref 0.2–1.2)
Total Protein: 6.7 g/dL (ref 6.0–8.3)

## 2019-11-22 LAB — LIPID PANEL
Cholesterol: 195 mg/dL (ref 0–200)
HDL: 36.4 mg/dL — ABNORMAL LOW (ref >39.00)
LDL Cholesterol: 128 mg/dL — ABNORMAL HIGH (ref 0–99)
NonHDL: 158.65
Total CHOL/HDL Ratio: 5
Triglycerides: 153 mg/dL — ABNORMAL HIGH (ref 0.0–149.0)
VLDL: 30.6 mg/dL (ref 0.0–40.0)

## 2019-11-22 LAB — B12 AND FOLATE PANEL
Folate: 18.6 ng/mL (ref >5.9)
Vitamin B-12: 579 pg/mL (ref 211–911)

## 2019-11-22 LAB — VITAMIN D 25 HYDROXY (VIT D DEFICIENCY, FRACTURES): VITD: 15.46 ng/mL — ABNORMAL LOW (ref 30.00–100.00)

## 2019-11-25 LAB — HEPATITIS C ANTIBODY
Hepatitis C Ab: NONREACTIVE
SIGNAL TO CUT-OFF: 0.01 (ref <1.00)

## 2019-11-26 ENCOUNTER — Ambulatory Visit (INDEPENDENT_AMBULATORY_CARE_PROVIDER_SITE_OTHER): Admitting: Internal Medicine

## 2019-11-26 ENCOUNTER — Other Ambulatory Visit: Payer: Self-pay

## 2019-11-26 ENCOUNTER — Encounter: Payer: Self-pay | Admitting: Internal Medicine

## 2019-11-26 DIAGNOSIS — Z8742 Personal history of other diseases of the female genital tract: Secondary | ICD-10-CM

## 2019-11-26 DIAGNOSIS — E559 Vitamin D deficiency, unspecified: Secondary | ICD-10-CM

## 2019-11-26 DIAGNOSIS — M5136 Other intervertebral disc degeneration, lumbar region: Secondary | ICD-10-CM

## 2019-11-26 DIAGNOSIS — R739 Hyperglycemia, unspecified: Secondary | ICD-10-CM

## 2019-11-26 DIAGNOSIS — Z8669 Personal history of other diseases of the nervous system and sense organs: Secondary | ICD-10-CM

## 2019-11-26 DIAGNOSIS — I10 Essential (primary) hypertension: Secondary | ICD-10-CM | POA: Diagnosis not present

## 2019-11-26 MED ORDER — AMLODIPINE BESYLATE 5 MG PO TABS: 5.0000 mg | ORAL_TABLET | Freq: Every day | ORAL | 2 refills | Status: DC

## 2019-11-26 NOTE — Progress Notes (Signed)
Patient ID: Sydney James, female   DOB: 10-Jun-1995, 24 y.o.   MRN: [id number]   Subjective:    Patient ID: Sydney James, female    DOB: 18-Jun-1995, 24 y.o.   MRN: [id number]  HPI This visit occurred during the SARS-CoV-2 public health emergency.  Safety protocols were in place, including screening questions prior to the visit, additional usage of staff PPE, and extensive cleaning of exam room while observing appropriate contact time as indicated for disinfecting solutions.  Patient here to establish care.  She is followed by OB/Gyn for breast, pelvic and pap smears.  Recently evaluated 10/22/19 by Philip Aspen.  Evaluated for LLQ pain.  History of ovarian cyst.  No pain now. Has a history of pre eclampsia.  Tries to stay active.  Works out for 30 min/day.  Watching diet.  No chest pain or sob reported.  Blood pressure has been elevated - 140s/80s.  S/p miscarriage and D&C 07/2019.  Not using any birth control now.  Has scoliosis.  Has back brace.  Known DDD.  On gabapentin.  Stable.  Has a history of migraines.  Diagnosed at age 62-14.  Found out triggered by candles.  Candles removed.  No problems with significant headaches now.  Does have TMJ - may trigger migraines.  Saw her dentist - planning - bite guard.  Increased stress related to above and also related to her mother's unexpected death recently.  Has good support.  Does not feel needs any further intervention at this time.    Past Medical History:  Diagnosis Date  . Lumbar herniated disc   . Migraine   . Preeclampsia   . PVC's (premature ventricular contractions)   . Raynaud's disease   . Scoliosis    Back brace for 2 years  . Scoliosis    Past Surgical History:  Procedure Laterality Date  . CESAREAN SECTION N/A 12/22/2017   Procedure: CESAREAN SECTION;  Surgeon: Harlin Heys, MD;  Location: [facility];  Service: Obstetrics;  Laterality: N/A;  Female Born @ 41    . DILATION AND CURETTAGE OF UTERUS    . DILATION AND  EVACUATION N/A 03/15/2016   Procedure: DILATATION AND EVACUATION;  Surgeon: Brayton Mars, MD;  Location: [facility];  Service: Gynecology;  Laterality: N/A;  . DILATION AND EVACUATION N/A 07/18/2019   Procedure: DILATATION AND EVACUATION;  Surgeon: Rubie Maid, MD;  Location: [facility];  Service: Gynecology;  Laterality: N/A;  . WISDOM TOOTH EXTRACTION     Family History  Problem Relation Age of Onset  . Hypertension Father   . Hypertension Mother   . Early death Mother   . Cancer Maternal Grandmother   . Cancer Maternal Grandfather   . Diabetes Maternal Grandfather   . Cancer Paternal Grandmother   . Diabetes Paternal Grandfather    Social History   Socioeconomic History  . Marital status: Married    Spouse name: Not on file  . Number of children: Not on file  . Years of education: Not on file  . Highest education level: Not on file  Occupational History  . Not on file  Tobacco Use  . Smoking status: Never Smoker  . Smokeless tobacco: Never Used  Vaping Use  . Vaping Use: Never used  Substance and Sexual Activity  . Alcohol use: No  . Drug use: No  . Sexual activity: Yes    Birth control/protection: Pill  Other Topics Concern  . Not on file  Social History Narrative  . Not  on file   Social Determinants of Health   Financial Resource Strain:   . Difficulty of Paying Living Expenses: Not on file  Food Insecurity:   . Worried About Charity fundraiser in the Last Year: Not on file  . Ran Out of Food in the Last Year: Not on file  Transportation Needs:   . Lack of Transportation (Medical): Not on file  . Lack of Transportation (Non-Medical): Not on file  Physical Activity:   . Days of Exercise per Week: Not on file  . Minutes of Exercise per Session: Not on file  Stress:   . Feeling of Stress : Not on file  Social Connections:   . Frequency of Communication with Friends and Family: Not on file  . Frequency of Social Gatherings with Friends and Family: Not  on file  . Attends Religious Services: Not on file  . Active Member of Clubs or Organizations: Not on file  . Attends Archivist Meetings: Not on file  . Marital Status: Not on file    Outpatient Encounter Medications as of 11/26/2019  Medication Sig  . amLODipine (NORVASC) 5 MG tablet Take 1 tablet (5 mg total) by mouth daily.  . ferrous sulfate 324 MG TBEC Take 324 mg by mouth.  . gabapentin (NEURONTIN) 100 MG capsule Take 100 mg twice a day for one week, then increase to 200 mg(2 tablets) twice a day and continue  . magnesium 30 MG tablet Take 30 mg by mouth 2 (two) times daily.  . Prenatal Vit-Fe Fumarate-FA (MULTIVITAMIN-PRENATAL) 27-0.8 MG TABS tablet Take 1 tablet by mouth daily at 12 noon.  . vitamin B-12 (CYANOCOBALAMIN) 500 MCG tablet Take 500 mcg by mouth daily.  . [DISCONTINUED] amLODipine (NORVASC) 2.5 MG tablet Take 1 tablet (2.5 mg total) by mouth daily.   No facility-administered encounter medications on file as of 11/26/2019.    Review of Systems  Constitutional: Negative for appetite change and unexpected weight change.  HENT: Negative for congestion and sinus pressure.   Respiratory: Negative for cough, chest tightness and shortness of breath.   Cardiovascular: Negative for chest pain, palpitations and leg swelling.  Gastrointestinal: Negative for abdominal pain, diarrhea, nausea and vomiting.  Genitourinary: Negative for difficulty urinating and dysuria.  Musculoskeletal: Negative for joint swelling and myalgias.  Skin: Negative for color change and rash.  Neurological: Negative for dizziness, light-headedness and headaches.  Psychiatric/Behavioral: Negative for agitation and dysphoric mood.       Increased stress as outlined.         Objective:    Physical Exam Vitals reviewed.  Constitutional:      General: She is not in acute distress.    Appearance: Normal appearance.  HENT:     Head: Normocephalic and atraumatic.     Right Ear: External  ear normal.     Left Ear: External ear normal.  Eyes:     General: No scleral icterus.       Right eye: No discharge.        Left eye: No discharge.     Conjunctiva/sclera: Conjunctivae normal.  Neck:     Thyroid: No thyromegaly.  Cardiovascular:     Rate and Rhythm: Normal rate and regular rhythm.  Pulmonary:     Effort: No respiratory distress.     Breath sounds: Normal breath sounds. No wheezing.  Abdominal:     General: Bowel sounds are normal.     Palpations: Abdomen is soft.  Tenderness: There is no abdominal tenderness.  Musculoskeletal:        General: No swelling or tenderness.     Cervical back: Neck supple. No tenderness.  Lymphadenopathy:     Cervical: No cervical adenopathy.  Skin:    Findings: No erythema or rash.  Neurological:     Mental Status: She is alert.  Psychiatric:        Mood and Affect: Mood normal.        Behavior: Behavior normal.     BP 128/70   Pulse 77   Temp 97.9 F (36.6 C) (Oral)   Resp 16   Ht 5' 7"  (1.702 m)   Wt 207 lb 3.2 oz (94 kg)   SpO2 99%   BMI 32.45 kg/m  Wt Readings from Last 3 Encounters:  11/26/19 207 lb 3.2 oz (94 kg)  10/22/19 207 lb 5 oz (94 kg)  09/05/19 201 lb (91.2 kg)     Lab Results  Component Value Date   WBC 8.9 11/22/2019   HGB 14.5 11/22/2019   HCT 43.4 11/22/2019   PLT 287.0 11/22/2019   GLUCOSE 106 (H) 11/22/2019   CHOL 195 11/22/2019   TRIG 153.0 (H) 11/22/2019   HDL 36.40 (L) 11/22/2019   LDLCALC 128 (H) 11/22/2019   ALT 12 11/22/2019   AST 12 11/22/2019   NA 137 11/22/2019   K 4.0 11/22/2019   CL 103 11/22/2019   CREATININE 0.70 11/22/2019   BUN 12 11/22/2019   CO2 24 11/22/2019   TSH 0.591 07/04/2019   GLUF 92 11/23/2017   HGBA1C 5.4 07/04/2019       Assessment & Plan:   Problem List Items Addressed This Visit    Vitamin D deficiency    Follow vitamin D level.       Hyperglycemia    Low carb diet and exercise.  Follow met b and a1c.       Relevant Orders    Hemoglobin A1c   Comprehensive metabolic panel   History of ovarian cyst    Followed by gyn.  No pain now.        History of migraine headaches    Diagnosed at age 16-14.  Candles - triggered.  Removed candles - headaches improved.  TMJ - triggered.  Seeing dentist - bite guard.  Follow.        Essential hypertension    Blood pressure remains elevated.  On amlodipine 2.59m q day.  Increase amlodipine to 561mq day.  Follow pressures.  Follow metabolic panel.       Relevant Medications   amLODipine (NORVASC) 5 MG tablet   DDD (degenerative disc disease), lumbar    Gabapentin.  Stable.  Follow.            ChEinar PheasantMD

## 2019-11-30 ENCOUNTER — Encounter: Payer: Self-pay | Admitting: Internal Medicine

## 2019-11-30 DIAGNOSIS — M5136 Other intervertebral disc degeneration, lumbar region: Secondary | ICD-10-CM | POA: Insufficient documentation

## 2019-11-30 DIAGNOSIS — Z8669 Personal history of other diseases of the nervous system and sense organs: Secondary | ICD-10-CM | POA: Insufficient documentation

## 2019-11-30 DIAGNOSIS — R739 Hyperglycemia, unspecified: Secondary | ICD-10-CM | POA: Insufficient documentation

## 2019-11-30 DIAGNOSIS — M51369 Other intervertebral disc degeneration, lumbar region without mention of lumbar back pain or lower extremity pain: Secondary | ICD-10-CM | POA: Insufficient documentation

## 2019-11-30 DIAGNOSIS — Z8742 Personal history of other diseases of the female genital tract: Secondary | ICD-10-CM | POA: Insufficient documentation

## 2019-11-30 NOTE — Assessment & Plan Note (Signed)
Gabapentin.  Stable.  Follow.

## 2019-11-30 NOTE — Assessment & Plan Note (Signed)
Blood pressure remains elevated.  On amlodipine 2.5mg  q day.  Increase amlodipine to 5mg  q day.  Follow pressures.  Follow metabolic panel.

## 2019-11-30 NOTE — Assessment & Plan Note (Signed)
Followed by gyn.  No pain now.

## 2019-11-30 NOTE — Assessment & Plan Note (Signed)
Low carb diet and exercise.  Follow met b and a1c.  

## 2019-11-30 NOTE — Assessment & Plan Note (Signed)
Follow vitamin D level.  

## 2019-11-30 NOTE — Assessment & Plan Note (Signed)
Diagnosed at age 26-14.  Candles - triggered.  Removed candles - headaches improved.  TMJ - triggered.  Seeing dentist - bite guard.  Follow.

## 2020-01-04 NOTE — L&D Delivery Note (Signed)
       Delivery Note   Sydney James is a 25 y.o. [id number] at [redacted]w[redacted]d Estimated Date of Delivery: 10/20/20  PRE-OPERATIVE DIAGNOSIS:  1) [redacted]w[redacted]d pregnancy. Chronic hypertension, TOLAC, GBS positive   POST-OPERATIVE DIAGNOSIS:  1) [redacted]w[redacted]d pregnancy s/p Vaginal, Spontaneous , Chronic Hypertension , TOLAC   Delivery Type: Vaginal, Spontaneous  , VBAC  Delivery Anesthesia: Epidural   Labor Complications:  None    ESTIMATED BLOOD LOSS: 150  ml    FINDINGS:   1) female infant, Apgar scores of  7  at 1 minute and   9 at 5 minutes and a birthweight of   ounces.    2) Nuchal cord: no  SPECIMENS:   PLACENTA:   Appearance: Intact , 3 vessel cord, sample collected   Removal: Spontaneous      Disposition:   held per protocol   DISPOSITION:  Infant to left in stable condition in the delivery room, with L&D personnel and mother,  NARRATIVE SUMMARY: Labor course:  [name] is a [id number] at [redacted]w[redacted]d who presented for induction of labor  due to chronic hypertension in pregnancy. She progressed well in labor with the use of the cooks catheter and pitocin.  She received the appropriate epidural anesthesia and proceeded to complete dilation. She evidenced good maternal expulsive effort during the second stage. She went on to deliver a viable female infant "Declan". The placenta delivered without problems and was noted to be complete. A perineal and vaginal examination was performed. No lacerations present. She bilateral periurethral abrasions that were not in need of repair. The patient tolerated this well.Infant remains skin to skin , her husband is at the bedside.   Doreene Burke, CNM  10/04/2020 8:44 PM

## 2020-01-10 ENCOUNTER — Other Ambulatory Visit (INDEPENDENT_AMBULATORY_CARE_PROVIDER_SITE_OTHER)

## 2020-01-10 ENCOUNTER — Other Ambulatory Visit: Payer: Self-pay

## 2020-01-10 DIAGNOSIS — R739 Hyperglycemia, unspecified: Secondary | ICD-10-CM

## 2020-01-10 LAB — COMPREHENSIVE METABOLIC PANEL
ALT: 15 U/L (ref 0–35)
AST: 13 U/L (ref 0–37)
Albumin: 4.5 g/dL (ref 3.5–5.2)
Alkaline Phosphatase: 59 U/L (ref 39–117)
BUN: 11 mg/dL (ref 6–23)
CO2: 29 mEq/L (ref 19–32)
Calcium: 9.5 mg/dL (ref 8.4–10.5)
Chloride: 102 mEq/L (ref 96–112)
Creatinine, Ser: 0.77 mg/dL (ref 0.40–1.20)
GFR: 106.63 mL/min (ref >60.00)
Glucose, Bld: 89 mg/dL (ref 70–99)
Potassium: 3.9 mEq/L (ref 3.5–5.1)
Sodium: 137 mEq/L (ref 135–145)
Total Bilirubin: 0.7 mg/dL (ref 0.2–1.2)
Total Protein: 7 g/dL (ref 6.0–8.3)

## 2020-01-10 LAB — HEMOGLOBIN A1C: Hgb A1c MFr Bld: 5.4 % (ref 4.6–6.5)

## 2020-01-27 ENCOUNTER — Telehealth: Payer: Self-pay | Admitting: Internal Medicine

## 2020-01-27 NOTE — Telephone Encounter (Signed)
Rejection Reason - Patient Declined" Unknown User said on Jan 27, 2020 2:06 PM  [facility]

## 2020-02-12 ENCOUNTER — Encounter: Payer: Self-pay | Admitting: Internal Medicine

## 2020-02-12 ENCOUNTER — Telehealth (INDEPENDENT_AMBULATORY_CARE_PROVIDER_SITE_OTHER): Admitting: Internal Medicine

## 2020-02-12 DIAGNOSIS — R739 Hyperglycemia, unspecified: Secondary | ICD-10-CM

## 2020-02-12 DIAGNOSIS — I10 Essential (primary) hypertension: Secondary | ICD-10-CM

## 2020-02-12 DIAGNOSIS — M5136 Other intervertebral disc degeneration, lumbar region: Secondary | ICD-10-CM

## 2020-02-12 DIAGNOSIS — R059 Cough, unspecified: Secondary | ICD-10-CM

## 2020-02-12 NOTE — Progress Notes (Signed)
Patient ID: Sydney James, female   DOB: 1995/03/14, 25 y.o.   MRN: [id number]   Virtual Visit via telephone Note  This visit type was conducted due to national recommendations for restrictions regarding the COVID-19 pandemic (e.g. social distancing).  This format is felt to be most appropriate for this patient at this time.  All issues noted in this document were discussed and addressed.  No physical exam was performed (except for noted visual exam findings with Video Visits).   I connected with [name] by telephone and verified that I am speaking with the correct person using two identifiers. Location patient: home Location provider: work  Persons participating in the telephone visit: patient, provider  The limitations, risks, security and privacy concerns of performing an evaluation and management service by telephone and the availability of in person appointments have been discussed.  It has also been discussed with the patient that there may be a patient responsible charge related to this service. The patient expressed understanding and agreed to proceed.   Reason for visit: follow up appt  HPI: Was seen as a new pt to establish care 11/26/19.  Followed by OB/GYN for breast, pelvic and pap smears.  Recent found out she was pregnant.  Off amlodipine now.  Blood pressure 120's-130s/80-90s.  Today 124/85.  Noticed some nausea a couple of weeks ago.  Was due to start 2/3-02/09/20.  Stopped taking amlodipine.  Took pregnancy test several days ago.  Positive.  Has f/u with OB next week.  Has been trying to work out.  Noticed some nausea after eating.  No vomiting.  No abdominal pain reported.  States her 27 year old is quarantined - covid exposure.  States yesterday - she developed sore throat and sinus drainage with some cough.  Discussed covid and testing.  Discussed the need to notify OB of symptoms and concerns.  No chest pain or sob.     ROS: See pertinent positives and negatives per  HPI.  Past Medical History:  Diagnosis Date  . Lumbar herniated disc   . Migraine   . Preeclampsia   . PVC's (premature ventricular contractions)   . Raynaud's disease   . Scoliosis    Back brace for 2 years  . Scoliosis     Past Surgical History:  Procedure Laterality Date  . CESAREAN SECTION N/A 12/22/2017   Procedure: CESAREAN SECTION;  Surgeon: Harlin Heys, MD;  Location: [facility];  Service: Obstetrics;  Laterality: N/A;  Female Born @ 7    . DILATION AND CURETTAGE OF UTERUS    . DILATION AND EVACUATION N/A 03/15/2016   Procedure: DILATATION AND EVACUATION;  Surgeon: Brayton Mars, MD;  Location: [facility];  Service: Gynecology;  Laterality: N/A;  . DILATION AND EVACUATION N/A 07/18/2019   Procedure: DILATATION AND EVACUATION;  Surgeon: Rubie Maid, MD;  Location: [facility];  Service: Gynecology;  Laterality: N/A;  . WISDOM TOOTH EXTRACTION      Family History  Problem Relation Age of Onset  . Hypertension Father   . Hypertension Mother   . Early death Mother   . Cancer Maternal Grandmother   . Cancer Maternal Grandfather   . Diabetes Maternal Grandfather   . Cancer Paternal Grandmother   . Diabetes Paternal Grandfather     SOCIAL HX: reviewed.    Current Outpatient Medications:  .  amLODipine (NORVASC) 5 MG tablet, Take 1 tablet (5 mg total) by mouth daily., Disp: 30 tablet, Rfl: 2 .  ferrous sulfate 324  MG TBEC, Take 324 mg by mouth., Disp: , Rfl:  .  gabapentin (NEURONTIN) 100 MG capsule, Take 100 mg twice a day for one week, then increase to 200 mg(2 tablets) twice a day and continue, Disp: , Rfl:  .  magnesium 30 MG tablet, Take 30 mg by mouth 2 (two) times daily., Disp: , Rfl:  .  Prenatal Vit-Fe Fumarate-FA (MULTIVITAMIN-PRENATAL) 27-0.8 MG TABS tablet, Take 1 tablet by mouth daily at 12 noon., Disp: , Rfl:  .  vitamin B-12 (CYANOCOBALAMIN) 500 MCG tablet, Take 500 mcg by mouth daily., Disp: , Rfl:   EXAM:  VITALS per patient if applicable:  696/78  GENERAL: alert.  Sounds to be in no acute distress.  Answering questions appropriately.    PSYCH/NEURO: pleasant and cooperative, no obvious depression or anxiety, speech and thought processing grossly intact  ASSESSMENT AND PLAN:  Discussed the following assessment and plan:  Problem List Items Addressed This Visit    Cough    Some sinus drainage, sore throat and some cough.  No sob.  No chest pain or tightness.  Saline nasal spray.  Apparently daughter in quarantine due to exposure.  Discussed covid testing.  Discussed with her regarding need to notify OB of concerns, possible exposure and symptoms.  Follow.  Notify me if progression of if needs anything more.        DDD (degenerative disc disease), lumbar    Has known DDD.  On gabapentin.  Allows her to be more active.  Was instructed by gyn to remain on for now.  Plans to discuss with OB.       Essential hypertension    On no medication now.  Was on amlodipine, but stopped recently when found out she was pregnant.  Blood pressure today 124/85.  Due to see GYN next week. Plans to discuss blood pressure medication with them.  Follow.        Hyperglycemia    Monitor carbs.  Follow met b and a1c.           I discussed the assessment and treatment plan with the patient. The patient was provided an opportunity to ask questions and all were answered. The patient agreed with the plan and demonstrated an understanding of the instructions.   The patient was advised to call back or seek an in-person evaluation if the symptoms worsen or if the condition fails to improve as anticipated.  I provided 23 minutes of non-face-to-face time during this encounter.   Einar Pheasant, MD

## 2020-02-14 ENCOUNTER — Encounter: Admitting: Certified Nurse Midwife

## 2020-02-16 ENCOUNTER — Encounter: Payer: Self-pay | Admitting: Internal Medicine

## 2020-02-16 DIAGNOSIS — R059 Cough, unspecified: Secondary | ICD-10-CM | POA: Insufficient documentation

## 2020-02-16 NOTE — Assessment & Plan Note (Signed)
Monitor carbs.  Follow met b and a1c.

## 2020-02-16 NOTE — Assessment & Plan Note (Signed)
Has known DDD.  On gabapentin.  Allows her to be more active.  Was instructed by gyn to remain on for now.  Plans to discuss with OB.

## 2020-02-16 NOTE — Assessment & Plan Note (Signed)
Some sinus drainage, sore throat and some cough.  No sob.  No chest pain or tightness.  Saline nasal spray.  Apparently daughter in quarantine due to exposure.  Discussed covid testing.  Discussed with her regarding need to notify OB of concerns, possible exposure and symptoms.  Follow.  Notify me if progression of if needs anything more.

## 2020-02-16 NOTE — Assessment & Plan Note (Signed)
On no medication now.  Was on amlodipine, but stopped recently when found out she was pregnant.  Blood pressure today 124/85.  Due to see GYN next week. Plans to discuss blood pressure medication with them.  Follow.

## 2020-02-17 ENCOUNTER — Ambulatory Visit (INDEPENDENT_AMBULATORY_CARE_PROVIDER_SITE_OTHER): Admitting: Certified Nurse Midwife

## 2020-02-17 ENCOUNTER — Other Ambulatory Visit: Payer: Self-pay

## 2020-02-17 ENCOUNTER — Encounter: Payer: Self-pay | Admitting: Certified Nurse Midwife

## 2020-02-17 VITALS — BP 108/82 | HR 79 | Resp 16 | Ht 67.0 in | Wt 205.5 lb

## 2020-02-17 DIAGNOSIS — N926 Irregular menstruation, unspecified: Secondary | ICD-10-CM

## 2020-02-17 LAB — POCT URINE PREGNANCY: Preg Test, Ur: POSITIVE — AB

## 2020-02-17 NOTE — Patient Instructions (Signed)
https://www.[facility].org/womens-health/faqs/prenatal-genetic-screening-tests">  Prenatal Care Prenatal care is health care during pregnancy. It helps you and your unborn baby (fetus) stay as healthy as possible. Prenatal care may be provided by a midwife, a family practice doctor, a mid-level practitioner (nurse practitioner or physician assistant), or a childbirth and pregnancy doctor (obstetrician). How does this affect me? During pregnancy, you will be closely monitored for any new conditions that might develop. To lower your risk of pregnancy complications, you and your health care provider will talk about any underlying conditions you have. How does this affect my baby? Early and consistent prenatal care increases the chance that your baby will be healthy during pregnancy. Prenatal care lowers the risk that your baby will be:  Born early (prematurely).  Smaller than expected at birth (small for gestational age). What can I expect at the first prenatal care visit? Your first prenatal care visit will likely be the longest. You should schedule your first prenatal care visit as soon as you know that you are pregnant. Your first visit is a good time to talk about any questions or concerns you have about pregnancy. Medical history At your visit, you and your health care provider will talk about your medical history, including:  Any past pregnancies.  Your family's medical history.  Medical history of the baby's father.  Any long-term (chronic) health conditions you have and how you manage them.  Any surgeries or procedures you have had.  Any current over-the-counter or prescription medicines, herbs, or supplements that you are taking.  Other factors that could pose a risk to your baby, including: ? Exposure to harmful chemicals or radiation at work or at home. ? Any substance use, including tobacco, alcohol, and drug use.  Your home setting and your stress levels, including: ? Exposure to  abuse or violence. ? Household financial strain.  Your daily health habits, including diet and exercise. Tests and screenings Your health care provider will:  Measure your weight, height, and blood pressure.  Do a physical exam, including a pelvic and breast exam.  Perform blood tests and urine tests to check for: ? Urinary tract infection. ? Sexually transmitted infections (STIs). ? Low iron levels in your blood (anemia). ? Blood type and certain proteins on red blood cells (Rh antibodies). ? Infections and immunity to viruses, such as hepatitis B and rubella. ? HIV (human immunodeficiency virus).  Discuss your options for genetic screening. Tips about staying healthy Your health care provider will also give you information about how to keep yourself and your baby healthy, including:  Nutrition and taking vitamins.  Physical activity.  How to manage pregnancy symptoms such as nausea and vomiting (morning sickness).  Infections and substances that may be harmful to your baby and how to avoid them.  Food safety.  Dental care.  Working.  Travel.  Warning signs to watch for and when to call your health care provider. How often will I have prenatal care visits? After your first prenatal care visit, you will have regular visits throughout your pregnancy. The visit schedule is often as follows:  Up to week 28 of pregnancy: once every 4 weeks.  28-36 weeks: once every 2 weeks.  After 36 weeks: every week until delivery. Some women may have visits more or less often depending on any underlying health conditions and the health of the baby. Keep all follow-up and prenatal care visits. This is important. What happens during routine prenatal care visits? Your health care provider will:  Measure your weight   and blood pressure.  Check for fetal heart sounds.  Measure the height of your uterus in your abdomen (fundal height). This may be measured starting around week 20 of  pregnancy.  Check the position of your baby inside your uterus.  Ask questions about your diet, sleeping patterns, and whether you can feel the baby move.  Review warning signs to watch for and signs of labor.  Ask about any pregnancy symptoms you are having and how you are dealing with them. Symptoms may include: ? Headaches. ? Nausea and vomiting. ? Vaginal discharge. ? Swelling. ? Fatigue. ? Constipation. ? Changes in your vision. ? Feeling persistently sad or anxious. ? Any discomfort, including back or pelvic pain. ? Bleeding or spotting. Make a list of questions to ask your health care provider at your routine visits.   What tests might I have during prenatal care visits? You may have blood, urine, and imaging tests throughout your pregnancy, such as:  Urine tests to check for glucose, protein, or signs of infection.  Glucose tests to check for a form of diabetes that can develop during pregnancy (gestational diabetes mellitus). This is usually done around week 24 of pregnancy.  Ultrasounds to check your baby's growth and development, to check for birth defects, and to check your baby's well-being. These can also help to decide when you should deliver your baby.  A test to check for group B strep (GBS) infection. This is usually done around week 36 of pregnancy.  Genetic testing. This may include blood, fluid, or tissue sampling, or imaging tests, such as an ultrasound. Some genetic tests are done during the first trimester and some are done during the second trimester. What else can I expect during prenatal care visits? Your health care provider may recommend getting certain vaccines during pregnancy. These may include:  A yearly flu shot (annual influenza vaccine). This is especially important if you will be pregnant during flu season.  Tdap (tetanus, diphtheria, pertussis) vaccine. Getting this vaccine during pregnancy can protect your baby from whooping cough  (pertussis) after birth. This vaccine may be recommended between weeks 27 and 36 of pregnancy.  A COVID-19 vaccine. Later in your pregnancy, your health care provider may give you information about:  Childbirth and breastfeeding classes.  Choosing a health care provider for your baby.  Umbilical cord banking.  Breastfeeding.  Birth control after your baby is born.  The hospital labor and delivery unit and how to set up a tour.  Registering at the hospital before you go into labor. Where to find more information  Office on Women's Health: womenshealth.gov  American Pregnancy Association: americanpregnancy.org  March of Dimes: marchofdimes.org Summary  Prenatal care helps you and your baby stay as healthy as possible during pregnancy.  Your first prenatal care visit will most likely be the longest.  You will have visits and tests throughout your pregnancy to monitor your health and your baby's health.  Bring a list of questions to your visits to ask your health care provider.  Make sure to keep all follow-up and prenatal care visits. This information is not intended to replace advice given to you by your health care provider. Make sure you discuss any questions you have with your health care provider. Document Revised: 10/03/2019 Document Reviewed: 10/03/2019 Elsevier Patient Education  2021 [employer]vier Inc.  

## 2020-02-17 NOTE — Progress Notes (Signed)
Subjective:    Mauricia Mertens is a 25 y.o. female who presents for evaluation of amenorrhea. She believes she could be pregnant. Pregnancy is desired. Sexual Activity: single partner, contraception: none. Current symptoms also include: fatigue. Last period was normal.   No LMP recorded. The following portions of the patient's history were reviewed and updated as appropriate: allergies, current medications, past family history, past medical history, past social history, past surgical history and problem list.  Review of Systems Pertinent items are noted in HPI.     Objective:    There were no vitals taken for this visit. General: alert, cooperative, appears stated age, mild distress and no acute distress    Lab Review Urine HCG: positive    Assessment:    Absence of menstruation.     Plan:   Positive: EGB:TDVVOH. Briefly discussed pre-natal care options. Plans to see midwives. Encouraged well-balanced diet, plenty of rest when needed, pre-natal vitamins daily and walking for exercise. Discussed self-help for nausea, avoiding OTC medications until consulting provider or pharmacist, other than Tylenol as needed, minimal caffeine (1-2 cups daily) and avoiding alcohol. She will schedule u/s for dating, nurse intake at 10 wks and her her initial OB visit [redacted] wk pregnant Feel free to call with any questions.   Doreene Burke, CNM

## 2020-02-19 ENCOUNTER — Encounter: Payer: Self-pay | Admitting: Internal Medicine

## 2020-02-20 ENCOUNTER — Telehealth: Payer: Self-pay | Admitting: Internal Medicine

## 2020-02-20 NOTE — Telephone Encounter (Signed)
Please Triage   Pt called she is having ear pain.. Pt was sick last week with a runny nose, cough and drainage she is also having ongoing diarrhea  Pt is also pregnant.. suggested her to go to an UC

## 2020-02-20 NOTE — Telephone Encounter (Signed)
Spoken to patient. She stated she went to [facility] on [address] and was dx ear infection. She was given amoxicillin for sx.

## 2020-02-25 ENCOUNTER — Telehealth: Payer: Self-pay

## 2020-02-25 NOTE — Telephone Encounter (Signed)
Pt  called in and stated that she has yeast infection symptoms. The pt stated that she has had them before and know what it is.The pt is wanting to be seen asap. I told her that I will have to send a message. The pt verbally understood.  Please advise

## 2020-02-26 ENCOUNTER — Telehealth: Payer: Self-pay

## 2020-02-26 MED ORDER — FLUCONAZOLE 150 MG PO TABS: 150.0000 mg | ORAL_TABLET | Freq: Every day | ORAL | 0 refills | Status: DC

## 2020-02-26 NOTE — Telephone Encounter (Signed)
See telephone encounter.

## 2020-02-26 NOTE — Telephone Encounter (Signed)
Per AT ok to send in Diflucan. Pt aware. Apologized to the pt for the delay in a return call.

## 2020-02-26 NOTE — Telephone Encounter (Signed)
pt called in and stated hat she called in yesterday and stated that she is having symptoms of a yeast infection, the pt said that it has gotten worse over night. The pt was told I can make you  an appt for tomorrow with Marcelino Duster, the pt said that the pain is really bad. I told the pt I will send a message to the nurse. Please advise

## 2020-03-03 ENCOUNTER — Telehealth: Payer: Self-pay | Admitting: Certified Nurse Midwife

## 2020-03-03 NOTE — Telephone Encounter (Signed)
Due to not having [employer] in office I had to reschedule patient 03-04-2020 apt to 03-06-2020 at 8:30. Spoke with patient made her aware she was understanding.  Spoke with Sheral Apley. At 03-03-2020 at 4:29 that confirmed no pa is required for cpt code 63893. Ref#[id number]

## 2020-03-04 ENCOUNTER — Other Ambulatory Visit

## 2020-03-05 ENCOUNTER — Other Ambulatory Visit: Payer: Self-pay | Admitting: Certified Nurse Midwife

## 2020-03-05 DIAGNOSIS — Z3491 Encounter for supervision of normal pregnancy, unspecified, first trimester: Secondary | ICD-10-CM

## 2020-03-05 DIAGNOSIS — N926 Irregular menstruation, unspecified: Secondary | ICD-10-CM

## 2020-03-05 DIAGNOSIS — O3680X0 Pregnancy with inconclusive fetal viability, not applicable or unspecified: Secondary | ICD-10-CM

## 2020-03-06 ENCOUNTER — Ambulatory Visit
Admission: RE | Admit: 2020-03-06 | Discharge: 2020-03-06 | Disposition: A | Discharge status: Home / Self Care | Source: Ambulatory Visit | Attending: Certified Nurse Midwife | Admitting: Certified Nurse Midwife

## 2020-03-06 ENCOUNTER — Other Ambulatory Visit: Payer: Self-pay

## 2020-03-06 DIAGNOSIS — N926 Irregular menstruation, unspecified: Secondary | ICD-10-CM

## 2020-03-06 DIAGNOSIS — Z3491 Encounter for supervision of normal pregnancy, unspecified, first trimester: Secondary | ICD-10-CM | POA: Diagnosis present

## 2020-03-06 DIAGNOSIS — O3680X0 Pregnancy with inconclusive fetal viability, not applicable or unspecified: Secondary | ICD-10-CM

## 2020-03-11 ENCOUNTER — Telehealth: Payer: Self-pay | Admitting: Certified Nurse Midwife

## 2020-03-11 NOTE — Telephone Encounter (Signed)
So it depends on the the genetic test she wants to have done? If she is doing the  panorama testing she needs to be at least [redacted] weeks pregnant before she can do it. If it is the afp that is not done until second trimester.

## 2020-03-11 NOTE — Telephone Encounter (Signed)
Made patient aware.

## 2020-03-11 NOTE — Telephone Encounter (Signed)
Pt called to adjust scheduled as directed; pt was asking about scheduling a lab apt next week for genetic testing- I asked if she had spoke to you about scheduling that now or waiting until nurse intake that is scheduled for 3-25- she said she just didn't want to wait an extra week.How do you advise?

## 2020-03-16 ENCOUNTER — Encounter

## 2020-03-27 ENCOUNTER — Other Ambulatory Visit: Payer: Self-pay

## 2020-03-27 ENCOUNTER — Ambulatory Visit (INDEPENDENT_AMBULATORY_CARE_PROVIDER_SITE_OTHER): Admitting: Surgical

## 2020-03-27 DIAGNOSIS — Z3481 Encounter for supervision of other normal pregnancy, first trimester: Secondary | ICD-10-CM

## 2020-03-27 NOTE — Progress Notes (Signed)
Sydney James presents for NOB nurse interview visit. Pregnancy confirmation done 02/17/2020. G7. B9390. Pregnancy education material explained and given. __2__ cats in home. NOB labs ordered. TSH/HbgA1c ordered due to BMI 30 or greater. Sickle cell ordered due to patient's race. HIV labs and drug screen were explained and ordered. PNV encouraged. Genetic screening options discussed. Genetic testing: Ordered. Patient may discuss with the provider. Patient to follow up with provider on 04/08/2020 for NOB physical. All questions answered. Patient signed FMLA and drug screen forms.

## 2020-03-27 NOTE — Patient Instructions (Signed)
Hypertension During Pregnancy Hypertension is also called high blood pressure. High blood pressure means that the force of the blood moving in your body is high enough to cause problems for you and your baby. Different types of high blood pressure can happen during pregnancy. The types are:  High blood pressure before you got pregnant. This is called chronic hypertension.  This can continue during your pregnancy. Your doctor will want to keep checking your blood pressure. You may need medicine to control your blood pressure while you are pregnant. You will need follow-up visits after you have your baby.  High blood pressure that goes up during pregnancy when it was normal before. This is called gestational hypertension. It will often get better after you have your baby, but your doctor will need to watch your blood pressure to make sure that it is getting better.  You may develop high blood pressure after giving birth. This is called postpartum hypertension. This often occurs within 48 hours after childbirth but may occur up to 6 weeks after giving birth. Very high blood pressure during pregnancy is an emergency that needs treatment right away. How does this affect me? If you have high blood pressure during pregnancy, you have a higher chance of developing high blood pressure:  As you get older.  If you get pregnant again. In some cases, high blood pressure during pregnancy can cause:  Stroke.  Heart attack.  Damage to the kidneys, lungs, or liver.  Preeclampsia.  HELLP syndrome.  Seizures.  Problems with the placenta. How does this affect my baby? Your baby may:  Be born early.  Not weigh as much as he or she should.  Not handle labor well, leading to a C-section. This condition may also result in a baby's death before birth (stillbirth). What are the risks?  Having high blood pressure during a past pregnancy.  Being overweight.  Being age 35 or older.  Being  pregnant for the first time.  Being pregnant with more than one baby.  Becoming pregnant using fertility methods, such as IVF.  Having other problems, such as diabetes or kidney disease. What can I do to lower my risk?  Keep a healthy weight.  Eat a healthy diet.  Follow what your doctor tells you about treating any medical problems that you had before you got pregnant. It is very important to go to all of your doctor visits. Your doctor will check your blood pressure and make sure that your pregnancy is progressing as it should. Treatment should start early if a problem is found.   How is this treated? Treatment for high blood pressure during pregnancy can vary. It depends on the type of high blood pressure you have and how serious it is.  If you were taking medicine for your blood pressure before you got pregnant, talk with your doctor. You may need to change the medicine during pregnancy if it is not safe for your baby.  If your blood pressure goes up during pregnancy, your doctor may order medicine to treat this.  If you are at risk for preeclampsia, your doctor may tell you to take a low-dose aspirin while you are pregnant.  If you have very high blood pressure, you may need to stay in the hospital so you and your baby can be watched closely. You may also need to take medicine to lower your blood pressure.  In some cases, if your condition gets worse, you may need to have your baby  early. Follow these instructions at home: Eating and drinking  Drink enough fluid to keep your pee (urine) pale yellow.  Avoid caffeine.   Lifestyle  Do not smoke or use any products that contain nicotine or tobacco. If you need help quitting, ask your doctor.  Do not use alcohol or drugs.  Avoid stress.  Rest and get plenty of sleep.  Regular exercise can help. Ask your doctor what kinds of exercise are best for you. General instructions  Take over-the-counter and prescription medicines  only as told by your doctor.  Keep all prenatal and follow-up visits. Contact a doctor if:  You have symptoms that your doctor told you to watch for, such as: ? Headaches. ? A feeling like you may vomit (nausea). ? Vomiting. ? Belly (abdominal) pain. ? Feeling dizzy or light-headed. Get help right away if:  You have symptoms of serious problems, such as: ? Very bad belly pain that does not get better with treatment. ? A very bad headache that does not get better. ? Blurry vision. ? Double vision. ? Vomiting that does not get better. ? Sudden, fast weight gain. ? Sudden swelling in your hands, ankles, or face. ? Bleeding from your vagina. ? Blood in your pee. ? Shortness of breath. ? Chest pain. ? Weakness on one side of your body. ? Trouble talking.  Your baby is not moving as much as usual. These symptoms may be an emergency. Get help right away. Call your local emergency services (911 in the [place]  Do not wait to see if the symptoms will go away.  Do not drive yourself to the hospital. Summary  High blood pressure is also called hypertension.  High blood pressure means that the force of the blood moving in your body is high enough to cause problems for you and your baby.  Get help right away if you have symptoms of serious problems due to high blood pressure.  Keep all prenatal and follow-up visits. This information is not intended to replace advice given to you by your health care provider. Make sure you discuss any questions you have with your health care provider. Document Revised: 09/12/2019 Document Reviewed: 09/12/2019 Elsevier Patient Education  2021 [place] [employer] Gain During Pregnancy A certain amount of weight gain during pregnancy is normal and healthy. The amount of weight you should gain during pregnancy depends on your overall health and your weight before you became pregnant. Talk with your health care provider to find out how much  weight you should gain during your pregnancy. General guidelines for a healthy total weight gain during pregnancy are based on your body mass index (BMI) and are listed below. If your BMI at or before the start of your pregnancy is:  Less than 18.5 (underweight), you should gain 28-40 lb (13-18 kg).  18.5-24.9 (normal weight), you should gain 25-35 lb (11-16 kg).  25-29.9 (overweight), you should gain 15-25 lb (7-11 kg).  30 or higher (obese), you should gain 11-20 lb (5-9 kg). Your health care provider may recommend that you:  Gain more weight, if you were underweight before pregnancy or if you are pregnant with more than one baby.  Gain less weight, if you were overweight before pregnancy or if you are gaining too much weight during your pregnancy. How does unhealthy weight gain affect me? Gaining too much weight during pregnancy can lead to pregnancy complications, such as:  A temporary form of diabetes that develops during pregnancy (gestational diabetes).  Hypertensive disorders of pregnancy (preeclampsia or gestational hypertension).  Raising your risk of having a more difficult delivery or a surgical delivery (cesarean delivery, or C-section). How does unhealthy weight gain affect my baby? Not gaining enough weight can be life-threatening for your baby. It may also raise these risks for your baby:  Being born early (preterm).  Being smaller than normal during pregnancy or not growing normally (intrauterine growth restriction).  Having a low weight at birth. Gaining too much weight may raise these risks for your baby:  Growing larger than normal during pregnancy (large for gestational age or macrosomia).  Increased risk of obesity. What actions can I take to gain a healthy amount of weight during pregnancy? Nutrition  Eat healthy foods. Every day, try to eat: ? Fruits and vegetables. Include a variety of colors and types, like sweet potatoes, oranges, apples, bell  peppers, beets, berries, squash, and broccoli. ? Whole grains, such as millet, barley, whole-wheat breads and cereals, and oatmeal. ? Low-fat dairy products, like yogurt, milk, and cheese. You can also include non-dairy choices, like almond milk or rice milk. ? Protein-rich foods, like lean meat, chicken, eggs, peas, and beans.  Avoid foods that are fried or have a lot of fat, salt (sodium), or sugar.  Drink enough fluid to keep your urine pale yellow.  Choose healthy snack and drink options when you are away from home: ? Drink water. Avoid soda, sports drinks, and juices that have added sugar. ? Avoid drinks with caffeine, such as coffee and energy drinks. ? Eat snacks that are high in protein, such as nuts, protein bars, and low-fat yogurt. ? Carry convenient snacks with you that do not need refrigeration, such as a pack of trail mix, an apple, or a granola bar.  If you need help improving your diet, work with a health care provider or a dietitian.   Activity  Exercise regularly, as told by your health care provider. ? If you were active before becoming pregnant, you may be able to continue your regular fitness activities. ? If you were not active before pregnancy, you may gradually build up to exercising for 30 or more minutes on most days of the week. This may include walking, swimming, or yoga.  Ask your health care provider what activities are safe for you. Talk with your health care provider about whether you may need to be excused from certain school or work activities.   Follow these instructions at home:  Take over-the-counter and prescription medicines only as told by your health care provider.  Take all prenatal supplements as told by your health care provider.  Keep track of your weight gain during pregnancy.  Keep all health care visits during pregnancy (prenatal visits). These visits are a good time to discuss your weight gain. Your health care provider will weigh you at  each visit to make sure you are gaining a healthy amount of weight. Where to find support Some pregnant women and teens face unique challenges and need extra support. If you have questions or need help gaining a healthy amount of weight during pregnancy, these people may help:  Your health care provider.  A dietitian.  Your school nurse.  Your family and friends.  Local counseling centers, church groups, or clinics that have services for teens. Where to find more information Learn more about managing your weight gain during pregnancy from:  American Pregnancy Association: americanpregnancy.org  [place] Department of Agriculture pregnancy weight gain calculator: http://www.wilson-mendoza.org/ Contact a health  care provider if:  You are unable to eat or drink for longer than 24 hours.  You cannot afford food or have trouble accessing regular meals. Get help right away if you: Summary  Talk with your health care provider to find out how much weight you should gain during your pregnancy.  Too much or too little weight gain during pregnancy can lead to complications for you and your baby.  Eat healthy foods like fruits and vegetables, whole grains, low-fat dairy products, and protein-rich foods.  Ask your health care provider what activities are safe for you.  Keep all of your prenatal visits. This information is not intended to replace advice given to you by your health care provider. Make sure you discuss any questions you have with your health care provider. Document Revised: 07/18/2019 Document Reviewed: 07/18/2019 Elsevier Patient Education  [place].   Common Medications Safe in Pregnancy  Acne:      Constipation:  Benzoyl Peroxide     Colace  Clindamycin      Dulcolax Suppository  Topica Erythromycin     Fibercon  Salicylic Acid      Metamucil         Miralax AVOID:        Senakot   Accutane    Cough:  Retin-A       Cough Drops  Tetracycline      Phenergan w/ Codeine if  Rx  Minocycline      Robitussin (Plain & DM)  Antibiotics:     Crabs/Lice:  Ceclor       RID  Cephalosporins    AVOID:  E-Mycins      Kwell  Keflex  Macrobid/Macrodantin   Diarrhea:  Penicillin      Kao-Pectate  Zithromax      Imodium AD         PUSH FLUIDS AVOID:       Cipro     Fever:  Tetracycline      Tylenol (Regular or Extra  Minocycline       Strength)  Levaquin      Extra Strength-Do not          Exceed 8 tabs/24 hrs Caffeine:        <234m/day (equiv. To 1 cup of coffee or  approx. 3 12 oz sodas)         Gas: Cold/Hayfever:       Gas-X  Benadryl      Mylicon  Claritin       Phazyme  **Claritin-D        Chlor-Trimeton    Headaches:  Dimetapp      ASA-Free Excedrin  Drixoral-Non-Drowsy     Cold Compress  Mucinex (Guaifenasin)     Tylenol (Regular or Extra  Sudafed/Sudafed-12 Hour     Strength)  **Sudafed PE Pseudoephedrine   Tylenol Cold & Sinus     Vicks Vapor Rub  Zyrtec  **AVOID if Problems With Blood Pressure         Heartburn: Avoid lying down for at least 1 hour after meals  Aciphex      Maalox     Rash:  Milk of Magnesia     Benadryl    Mylanta       1% Hydrocortisone Cream  Pepcid  Pepcid Complete   Sleep Aids:  Prevacid      Ambien   Prilosec       Benadryl  Rolaids       Chamomile Tea  Tums (Limit 4/day)     Unisom         Tylenol PM         Warm milk-add vanilla or  Hemorrhoids:       Sugar for taste  Anusol/Anusol H.C.  (RX: Analapram 2.5%)  Sugar Substitutes:  Hydrocortisone OTC     Ok in moderation  Preparation H      Tucks        Vaseline lotion applied to tissue with wiping    Herpes:     Throat:  Acyclovir      Oragel  Famvir  Valtrex     Vaccines:         Flu Shot Leg Cramps:       *Gardasil  Benadryl      Hepatitis A         Hepatitis B Nasal Spray:       Pneumovax  Saline Nasal Spray     Polio Booster         Tetanus Nausea:       Tuberculosis test or PPD  Vitamin B6 25 mg  TID   AVOID:    Dramamine      *Gardasil  Emetrol       Live Poliovirus  Ginger Root 250 mg QID    MMR (measles, mumps &  High Complex Carbs @ Bedtime    rebella)  Sea Bands-Accupressure    Varicella (Chickenpox)  Unisom 1/2 tab TID     *No known complications           If received before Pain:         Known pregnancy;   Darvocet       Resume series after  Lortab        Delivery  Percocet    Yeast:   Tramadol      Femstat  Tylenol 3      Gyne-lotrimin  Ultram       Monistat  Vicodin           MISC:         All Sunscreens           Hair Coloring/highlights          Insect Repellant's          (Including DEET)         Mystic Tans   [email or web address]">  First Trimester of Pregnancy  The first trimester of pregnancy starts on the first day of your last menstrual period until the end of week 12. This is also called months 1 through 3 of pregnancy. Body changes during your first trimester Your body goes through many changes during pregnancy. The changes usually return to normal after your baby is born. Physical changes  You may gain or lose weight.  Your breasts may grow larger and hurt. The area around your nipples may get darker.  Dark spots or blotches may develop on your face.  You may have changes in your hair. Health changes  You may feel like you might vomit (nauseous), and you may vomit.  You may have heartburn.  You may have headaches.  You may have trouble pooping (constipation).  Your gums may bleed. Other changes  You may get tired easily.  You may pee (urinate) more often.  Your menstrual periods will stop.  You may not feel hungry.  You may want to eat certain kinds of food.  You may have changes in your emotions from day to day.  You may have more dreams. Follow these instructions at home: Medicines  Take over-the-counter and prescription medicines only as told by your doctor. Some medicines are not safe during  pregnancy.  Take a prenatal vitamin that contains at least 600 micrograms (mcg) of folic acid. Eating and drinking  Eat healthy meals that include: ? Fresh fruits and vegetables. ? Whole grains. ? Good sources of protein, such as meat, eggs, or tofu. ? Low-fat dairy products.  Avoid raw meat and unpasteurized juice, milk, and cheese.  If you feel like you may vomit, or you vomit: ? Eat 4 or 5 small meals a day instead of 3 large meals. ? Try eating a few soda crackers. ? Drink liquids between meals instead of during meals.  You may need to take these actions to prevent or treat trouble pooping: ? Drink enough fluids to keep your pee (urine) pale yellow. ? Eat foods that are high in fiber. These include beans, whole grains, and fresh fruits and vegetables. ? Limit foods that are high in fat and sugar. These include fried or sweet foods. Activity  Exercise only as told by your doctor. Most people can do their usual exercise routine during pregnancy.  Stop exercising if you have cramps or pain in your lower belly (abdomen) or low back.  Do not exercise if it is too hot or too humid, or if you are in a place of great height (high altitude).  Avoid heavy lifting.  If you choose to, you may have sex unless your doctor tells you not to. Relieving pain and discomfort  Wear a good support bra if your breasts are sore.  Rest with your legs raised (elevated) if you have leg cramps or low back pain.  If you have bulging veins (varicose veins) in your legs: ? Wear support hose as told by your doctor. ? Raise your feet for 15 minutes, 3-4 times a day. ? Limit salt in your food. Safety  Wear your seat belt at all times when you are in a car.  Talk with your doctor if someone is hurting you or yelling at you.  Talk with your doctor if you are feeling sad or have thoughts of hurting yourself. Lifestyle  Do not use hot tubs, steam rooms, or saunas.  Do not douche. Do not use  tampons or scented sanitary pads.  Do not use herbal medicines, illegal drugs, or medicines that are not approved by your doctor. Do not drink alcohol.  Do not smoke or use any products that contain nicotine or tobacco. If you need help quitting, ask your doctor.  Avoid cat litter boxes and soil that is used by cats. These carry germs that can cause harm to the baby and can cause a loss of your baby by miscarriage or stillbirth. General instructions  Keep all follow-up visits. This is important.  Ask for help if you need counseling or if you need help with nutrition. Your doctor can give you advice or tell you where to go for help.  Visit your dentist. At home, brush your teeth with a soft toothbrush. Floss gently.  Write down your questions. Take them to your prenatal visits. Where to find more information  American Pregnancy Association: americanpregnancy.org  [employer]: www.acog.org  Office on Women's Health: KeywordPortfolios.com.br Contact a doctor if:  You are dizzy.  You have a fever.  You have mild cramps or pressure in your lower belly.  You have a nagging pain  in your belly area.  You continue to feel like you may vomit, you vomit, or you have watery poop (diarrhea) for 24 hours or longer.  You have a bad-smelling fluid coming from your vagina.  You have pain when you pee.  You are exposed to a disease that spreads from person to person, such as chickenpox, measles, Zika virus, HIV, or hepatitis. Get help right away if:  You have spotting or bleeding from your vagina.  You have very bad belly cramping or pain.  You have shortness of breath or chest pain.  You have any kind of injury, such as from a fall or a car crash.  You have new or increased pain, swelling, or redness in an arm or leg. Summary  The first trimester of pregnancy starts on the first day of your last menstrual period until the end of week 12  (months 1 through 3).  Eat 4 or 5 small meals a day instead of 3 large meals.  Do not smoke or use any products that contain nicotine or tobacco. If you need help quitting, ask your doctor.  Keep all follow-up visits. This information is not intended to replace advice given to you by your health care provider. Make sure you discuss any questions you have with your health care provider. Document Revised: 05/29/2019 Document Reviewed: 04/04/2019 Elsevier Patient Education  2021 [employer].

## 2020-03-28 LAB — CBC WITH DIFFERENTIAL/PLATELET
Basophils Absolute: 0 10*3/uL (ref 0.0–0.2)
Basos: 0 %
EOS (ABSOLUTE): 0.1 10*3/uL (ref 0.0–0.4)
Eos: 1 %
Hematocrit: 43.1 % (ref 34.0–46.6)
Hemoglobin: 14.1 g/dL (ref 11.1–15.9)
Immature Grans (Abs): 0.1 10*3/uL (ref 0.0–0.1)
Immature Granulocytes: 1 %
Lymphocytes Absolute: 2.4 10*3/uL (ref 0.7–3.1)
Lymphs: 25 %
MCH: 27.9 pg (ref 26.6–33.0)
MCHC: 32.7 g/dL (ref 31.5–35.7)
MCV: 85 fL (ref 79–97)
Monocytes Absolute: 0.7 10*3/uL (ref 0.1–0.9)
Monocytes: 7 %
Neutrophils Absolute: 6.5 10*3/uL (ref 1.4–7.0)
Neutrophils: 66 %
Platelets: 325 10*3/uL (ref 150–450)
RBC: 5.05 x10E6/uL (ref 3.77–5.28)
RDW: 12.9 % (ref 11.7–15.4)
WBC: 9.8 10*3/uL (ref 3.4–10.8)

## 2020-03-28 LAB — URINALYSIS, ROUTINE W REFLEX MICROSCOPIC
Bilirubin, UA: NEGATIVE
Glucose, UA: NEGATIVE
Ketones, UA: NEGATIVE
Leukocytes,UA: NEGATIVE
Nitrite, UA: NEGATIVE
RBC, UA: NEGATIVE
Specific Gravity, UA: 1.027 (ref 1.005–1.030)
Urobilinogen, Ur: 0.2 mg/dL (ref 0.2–1.0)
pH, UA: 6.5 (ref 5.0–7.5)

## 2020-03-28 LAB — TOXOPLASMA ANTIBODIES- IGG AND  IGM
Toxoplasma Antibody- IgM: <3 AU/mL (ref 0.0–7.9)
Toxoplasma IgG Ratio: <3 IU/mL (ref 0.0–7.1)

## 2020-03-28 LAB — HIV ANTIBODY (ROUTINE TESTING W REFLEX): HIV Screen 4th Generation wRfx: NONREACTIVE

## 2020-03-28 LAB — ABO AND RH: Rh Factor: POSITIVE

## 2020-03-28 LAB — VIRAL HEPATITIS HBV, HCV
HCV Ab: <0.1 s/co ratio (ref 0.0–0.9)
Hep B Core Total Ab: NEGATIVE
Hep B Surface Ab, Qual: NONREACTIVE
Hepatitis B Surface Ag: NEGATIVE

## 2020-03-28 LAB — RUBELLA SCREEN: Rubella Antibodies, IGG: 1.54 index (ref >0.99)

## 2020-03-28 LAB — HEMOGLOBIN A1C
Est. average glucose Bld gHb Est-mCnc: 114 mg/dL
Hgb A1c MFr Bld: 5.6 % (ref 4.8–5.6)

## 2020-03-28 LAB — TSH: TSH: 0.808 u[IU]/mL (ref 0.450–4.500)

## 2020-03-28 LAB — HCV INTERPRETATION

## 2020-03-28 LAB — VARICELLA ZOSTER ANTIBODY, IGG: Varicella zoster IgG: 634 index (ref >165)

## 2020-03-28 LAB — RPR: RPR Ser Ql: NONREACTIVE

## 2020-03-28 LAB — ANTIBODY SCREEN: Antibody Screen: NEGATIVE

## 2020-03-29 LAB — GC/CHLAMYDIA PROBE AMP
Chlamydia trachomatis, NAA: NEGATIVE
Neisseria Gonorrhoeae by PCR: NEGATIVE

## 2020-03-29 LAB — URINE CULTURE, OB REFLEX: Organism ID, Bacteria: NO GROWTH

## 2020-03-29 LAB — CULTURE, OB URINE

## 2020-03-30 ENCOUNTER — Encounter: Admitting: Certified Nurse Midwife

## 2020-03-30 LAB — MONITOR DRUG PROFILE 14(MW)
Amphetamine Scrn, Ur: NEGATIVE ng/mL
BARBITURATE SCREEN URINE: NEGATIVE ng/mL
BENZODIAZEPINE SCREEN, URINE: NEGATIVE ng/mL
Buprenorphine, Urine: NEGATIVE ng/mL
CANNABINOIDS UR QL SCN: NEGATIVE ng/mL
Cocaine (Metab) Scrn, Ur: NEGATIVE ng/mL
Creatinine(Crt), U: 200.2 mg/dL (ref 20.0–300.0)
Fentanyl, Urine: NEGATIVE pg/mL
Meperidine Screen, Urine: NEGATIVE ng/mL
Methadone Screen, Urine: NEGATIVE ng/mL
OXYCODONE+OXYMORPHONE UR QL SCN: NEGATIVE ng/mL
Opiate Scrn, Ur: NEGATIVE ng/mL
Ph of Urine: 6.5 (ref 4.5–8.9)
Phencyclidine Qn, Ur: NEGATIVE ng/mL
Propoxyphene Scrn, Ur: NEGATIVE ng/mL
SPECIFIC GRAVITY: 1.019
Tramadol Screen, Urine: NEGATIVE ng/mL

## 2020-04-06 ENCOUNTER — Telehealth: Payer: Self-pay | Admitting: Certified Nurse Midwife

## 2020-04-06 NOTE — Telephone Encounter (Signed)
New Message:   Pt would like gender results printed and put in a envelope and left up front for her to pick up

## 2020-04-08 ENCOUNTER — Other Ambulatory Visit: Payer: Self-pay

## 2020-04-08 ENCOUNTER — Encounter: Payer: Self-pay | Admitting: Certified Nurse Midwife

## 2020-04-08 ENCOUNTER — Other Ambulatory Visit (HOSPITAL_COMMUNITY)
Admission: RE | Admit: 2020-04-08 | Discharge: 2020-04-08 | Disposition: A | Discharge status: Home / Self Care | Source: Ambulatory Visit | Attending: Certified Nurse Midwife | Admitting: Certified Nurse Midwife

## 2020-04-08 ENCOUNTER — Ambulatory Visit (INDEPENDENT_AMBULATORY_CARE_PROVIDER_SITE_OTHER): Admitting: Certified Nurse Midwife

## 2020-04-08 VITALS — BP 135/84 | HR 80 | Wt 210.1 lb

## 2020-04-08 DIAGNOSIS — Z3401 Encounter for supervision of normal first pregnancy, first trimester: Secondary | ICD-10-CM

## 2020-04-08 DIAGNOSIS — Z3A12 12 weeks gestation of pregnancy: Secondary | ICD-10-CM | POA: Diagnosis not present

## 2020-04-08 DIAGNOSIS — Z124 Encounter for screening for malignant neoplasm of cervix: Secondary | ICD-10-CM | POA: Insufficient documentation

## 2020-04-08 DIAGNOSIS — O0990 Supervision of high risk pregnancy, unspecified, unspecified trimester: Secondary | ICD-10-CM | POA: Diagnosis not present

## 2020-04-08 LAB — POCT URINALYSIS DIPSTICK OB
Appearance: NORMAL
Bilirubin, UA: NEGATIVE
Blood, UA: NEGATIVE
Glucose, UA: NEGATIVE
Ketones, UA: NEGATIVE
Leukocytes, UA: NEGATIVE
Nitrite, UA: NEGATIVE
Odor: NORMAL
POC,PROTEIN,UA: NEGATIVE
Spec Grav, UA: 1.01 (ref 1.010–1.025)
Urobilinogen, UA: 0.2 E.U./dL
pH, UA: 6.5 (ref 5.0–8.0)

## 2020-04-08 MED ORDER — ASPIRIN EC 81 MG PO TBEC: 81.0000 mg | DELAYED_RELEASE_TABLET | Freq: Every day | ORAL | 11 refills | Status: DC

## 2020-04-08 MED ORDER — GABAPENTIN 100 MG PO CAPS: 100.0000 mg | ORAL_CAPSULE | Freq: Two times a day (BID) | ORAL | 6 refills | Status: DC

## 2020-04-08 NOTE — Progress Notes (Signed)
NOB: she has some swelling when she wakes up in the morning.

## 2020-04-08 NOTE — Patient Instructions (Signed)
https://www.[facility].org/womens-health/faqs/prenatal-genetic-screening-tests">  Prenatal Care Prenatal care is health care during pregnancy. It helps you and your unborn baby (fetus) stay as healthy as possible. Prenatal care may be provided by a midwife, a family practice doctor, a mid-level practitioner (nurse practitioner or physician assistant), or a childbirth and pregnancy doctor (obstetrician). How does this affect me? During pregnancy, you will be closely monitored for any new conditions that might develop. To lower your risk of pregnancy complications, you and your health care provider will talk about any underlying conditions you have. How does this affect my baby? Early and consistent prenatal care increases the chance that your baby will be healthy during pregnancy. Prenatal care lowers the risk that your baby will be:  Born early (prematurely).  Smaller than expected at birth (small for gestational age). What can I expect at the first prenatal care visit? Your first prenatal care visit will likely be the longest. You should schedule your first prenatal care visit as soon as you know that you are pregnant. Your first visit is a good time to talk about any questions or concerns you have about pregnancy. Medical history At your visit, you and your health care provider will talk about your medical history, including:  Any past pregnancies.  Your family's medical history.  Medical history of the baby's father.  Any long-term (chronic) health conditions you have and how you manage them.  Any surgeries or procedures you have had.  Any current over-the-counter or prescription medicines, herbs, or supplements that you are taking.  Other factors that could pose a risk to your baby, including: ? Exposure to harmful chemicals or radiation at work or at home. ? Any substance use, including tobacco, alcohol, and drug use.  Your home setting and your stress levels, including: ? Exposure to  abuse or violence. ? Household financial strain.  Your daily health habits, including diet and exercise. Tests and screenings Your health care provider will:  Measure your weight, height, and blood pressure.  Do a physical exam, including a pelvic and breast exam.  Perform blood tests and urine tests to check for: ? Urinary tract infection. ? Sexually transmitted infections (STIs). ? Low iron levels in your blood (anemia). ? Blood type and certain proteins on red blood cells (Rh antibodies). ? Infections and immunity to viruses, such as hepatitis B and rubella. ? HIV (human immunodeficiency virus).  Discuss your options for genetic screening. Tips about staying healthy Your health care provider will also give you information about how to keep yourself and your baby healthy, including:  Nutrition and taking vitamins.  Physical activity.  How to manage pregnancy symptoms such as nausea and vomiting (morning sickness).  Infections and substances that may be harmful to your baby and how to avoid them.  Food safety.  Dental care.  Working.  Travel.  Warning signs to watch for and when to call your health care provider. How often will I have prenatal care visits? After your first prenatal care visit, you will have regular visits throughout your pregnancy. The visit schedule is often as follows:  Up to week 28 of pregnancy: once every 4 weeks.  28-36 weeks: once every 2 weeks.  After 36 weeks: every week until delivery. Some women may have visits more or less often depending on any underlying health conditions and the health of the baby. Keep all follow-up and prenatal care visits. This is important. What happens during routine prenatal care visits? Your health care provider will:  Measure your weight   and blood pressure.  Check for fetal heart sounds.  Measure the height of your uterus in your abdomen (fundal height). This may be measured starting around week 20 of  pregnancy.  Check the position of your baby inside your uterus.  Ask questions about your diet, sleeping patterns, and whether you can feel the baby move.  Review warning signs to watch for and signs of labor.  Ask about any pregnancy symptoms you are having and how you are dealing with them. Symptoms may include: ? Headaches. ? Nausea and vomiting. ? Vaginal discharge. ? Swelling. ? Fatigue. ? Constipation. ? Changes in your vision. ? Feeling persistently sad or anxious. ? Any discomfort, including back or pelvic pain. ? Bleeding or spotting. Make a list of questions to ask your health care provider at your routine visits.   What tests might I have during prenatal care visits? You may have blood, urine, and imaging tests throughout your pregnancy, such as:  Urine tests to check for glucose, protein, or signs of infection.  Glucose tests to check for a form of diabetes that can develop during pregnancy (gestational diabetes mellitus). This is usually done around week 24 of pregnancy.  Ultrasounds to check your baby's growth and development, to check for birth defects, and to check your baby's well-being. These can also help to decide when you should deliver your baby.  A test to check for group B strep (GBS) infection. This is usually done around week 36 of pregnancy.  Genetic testing. This may include blood, fluid, or tissue sampling, or imaging tests, such as an ultrasound. Some genetic tests are done during the first trimester and some are done during the second trimester. What else can I expect during prenatal care visits? Your health care provider may recommend getting certain vaccines during pregnancy. These may include:  A yearly flu shot (annual influenza vaccine). This is especially important if you will be pregnant during flu season.  Tdap (tetanus, diphtheria, pertussis) vaccine. Getting this vaccine during pregnancy can protect your baby from whooping cough  (pertussis) after birth. This vaccine may be recommended between weeks 27 and 36 of pregnancy.  A COVID-19 vaccine. Later in your pregnancy, your health care provider may give you information about:  Childbirth and breastfeeding classes.  Choosing a health care provider for your baby.  Umbilical cord banking.  Breastfeeding.  Birth control after your baby is born.  The hospital labor and delivery unit and how to set up a tour.  Registering at the hospital before you go into labor. Where to find more information  Office on Women's Health: womenshealth.gov  American Pregnancy Association: americanpregnancy.org  March of Dimes: marchofdimes.org Summary  Prenatal care helps you and your baby stay as healthy as possible during pregnancy.  Your first prenatal care visit will most likely be the longest.  You will have visits and tests throughout your pregnancy to monitor your health and your baby's health.  Bring a list of questions to your visits to ask your health care provider.  Make sure to keep all follow-up and prenatal care visits. This information is not intended to replace advice given to you by your health care provider. Make sure you discuss any questions you have with your health care provider. Document Revised: 10/03/2019 Document Reviewed: 10/03/2019 Elsevier Patient Education  2021 [employer]vier Inc.  

## 2020-04-08 NOTE — Telephone Encounter (Signed)
Pt has received gender results.

## 2020-04-08 NOTE — Progress Notes (Signed)
NEW OB HISTORY AND PHYSICAL  SUBJECTIVE:       Sydney James is a 25 y.o. [phone] female, Patient's last menstrual period was 01/06/2020 (approximate)., Estimated Date of Delivery: 10/20/20, [redacted]w[redacted]d, presents today for establishment of Prenatal Care. 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 Normal Contraception: none Last Pap:07/19/17. Results were: normal  Obstetric History OB History  Gravida Para Term Preterm AB Living  7 2 0 2 4 2   SAB IAB Ectopic Multiple Live Births  3 0 0 0 2    # Outcome Date GA Lbr Len/2nd Weight Sex Delivery Anes PTL Lv  7 Current           6 AB 07/18/19 [redacted]w[redacted]d    SAB     5 Preterm 12/22/17 [redacted]w[redacted]d  4 lb 9.4 oz (2.08 kg) M CS-LTranv EPI, Spinal  LIV  4 SAB 09/2016          3 SAB 2018          2 Preterm 01/25/15 [redacted]w[redacted]d 03:30 / 01:45 6 lb 4.2 oz (2.84 kg) M Vag-Spont EPI  LIV  1 SAB 2011            Obstetric Comments  G6- missed AB at [redacted] weeks gestation    Past Medical History:  Diagnosis Date  . Lumbar herniated disc   . Migraine   . Preeclampsia   . PVC's (premature ventricular contractions)   . Raynaud's disease   . Scoliosis    Back brace for 2 years  . Scoliosis     Past Surgical History:  Procedure Laterality Date  . CESAREAN SECTION N/A 12/22/2017   Procedure: CESAREAN SECTION;  Surgeon: 12/24/2017, MD;  Location: [facility] ORS;  Service: Obstetrics;  Laterality: N/A;  Female Born @ 47    . DILATION AND CURETTAGE OF UTERUS    . DILATION AND EVACUATION N/A 03/15/2016   Procedure: DILATATION AND EVACUATION;  Surgeon: 03/17/2016, MD;  Location: [facility] ORS;  Service: Gynecology;  Laterality: N/A;  . DILATION AND EVACUATION N/A 07/18/2019   Procedure: DILATATION AND EVACUATION;  Surgeon: 07/20/2019, MD;  Location: [facility] ORS;  Service:  Gynecology;  Laterality: N/A;  . WISDOM TOOTH EXTRACTION      Current Outpatient Medications on File Prior to Visit  Medication Sig Dispense Refill  . gabapentin (NEURONTIN) 100 MG capsule Take 100 mg twice a day for one week, then increase to 200 mg(2 tablets) twice a day and continue    . magnesium 30 MG tablet Take 30 mg by mouth 2 (two) times daily.    . Prenatal Vit-Fe Fumarate-FA (MULTIVITAMIN-PRENATAL) 27-0.8 MG TABS tablet Take 1 tablet by mouth daily at 12 noon.    Hildred Laser amLODipine (NORVASC) 5 MG tablet Take 1 tablet (5 mg total) by mouth daily. (Patient not taking: Reported on 04/08/2020) 30 tablet 2  . ferrous sulfate 324 MG TBEC Take 324 mg by mouth. (Patient not taking: Reported on 04/08/2020)    . fluconazole (DIFLUCAN) 150 MG tablet Take 1 tablet (150 mg total) by mouth daily. (Patient not taking: Reported on 04/08/2020) 1 tablet 0  . vitamin B-12 (CYANOCOBALAMIN) 500 MCG tablet Take 500 mcg by mouth daily. (Patient not taking: Reported on 04/08/2020)     No current facility-administered medications on file prior to  visit.    Allergies  Allergen Reactions  . Aloe Hives  . Lubricants     Has to have water based lubricant. Allergy to non-water based lubricant  . Monistat [Miconazole]   . Sulfa Antibiotics Itching and Rash  . Tape Itching and Rash    Social History   Socioeconomic History  . Marital status: Married    Spouse name: Not on file  . Number of children: Not on file  . Years of education: Not on file  . Highest education level: Not on file  Occupational History  . Not on file  Tobacco Use  . Smoking status: Never Smoker  . Smokeless tobacco: Never Used  Vaping Use  . Vaping Use: Never used  Substance and Sexual Activity  . Alcohol use: No  . Drug use: No  . Sexual activity: Yes    Birth control/protection: Pill  Other Topics Concern  . Not on file  Social History Narrative  . Not on file   Social Determinants of Health   Financial Resource Strain: Not  on file  Food Insecurity: Not on file  Transportation Needs: Not on file  Physical Activity: Not on file  Stress: Not on file  Social Connections: Not on file  Intimate Partner Violence: Not on file    Family History  Problem Relation Age of Onset  . Hypertension Father   . Hypertension Mother   . Early death Mother   . Cancer Maternal Grandmother   . Cancer Maternal Grandfather   . Diabetes Maternal Grandfather   . Cancer Paternal Grandmother   . Diabetes Paternal Grandfather     The following portions of the patient's history were reviewed and updated as appropriate: allergies, current medications, past OB history, past medical history, past surgical history, past family history, past social history, and problem list.    OBJECTIVE: Initial Physical Exam (New OB)  GENERAL APPEARANCE: alert, well appearing, in no apparent distress, oriented to person, place and time, overweight HEAD: normocephalic, atraumatic MOUTH: mucous membranes moist, pharynx normal without lesions THYROID: no thyromegaly or masses present BREASTS: no masses noted, no significant tenderness, no palpable axillary nodes, no skin changes LUNGS: clear to auscultation, no wheezes, rales or rhonchi, symmetric air entry HEART: regular rate and rhythm, no murmurs ABDOMEN: soft, nontender, nondistended, no abnormal masses, no epigastric pain and FHT present EXTREMITIES: no redness or tenderness in the calves or thighs, no edema, no limitation in range of motion, intact peripheral pulses SKIN: normal coloration and turgor, no rashes LYMPH NODES: no adenopathy palpable NEUROLOGIC: alert, oriented, normal speech, no focal findings or movement disorder noted  PELVIC EXAM EXTERNAL GENITALIA: normal appearing vulva with no masses, tenderness or lesions VAGINA: no abnormal discharge or lesions CERVIX: no lesions or cervical motion tenderness, pap collected , contact bleeding present  UTERUS: gravid ADNEXA: no masses  palpable and nontender OB EXAM PELVIMETRY: appears adequate RECTUM: exam not indicated  ASSESSMENT: Normal pregnancy  PLAN: New OB counseling: The patient has been given an overview regarding routine prenatal care. Recommendations regarding diet, weight gain, and exercise in pregnancy were given. Prenatal testing, optional genetic testing, and ultrasound use in pregnancy were reviewed.  Has completed already. Baby aspirin ordered. Pt state her necrologist will not re order the gabapentin for her during pregnancy for her degeneracies disc disease.. She is taking 100 mg BID. Discussed risk and benefits of medication during the pregnancy.  Benefits of Breast Feeding were discussed. The patient is encouraged to consider nursing her  baby post partum.   Doreene Burke, CNM

## 2020-04-13 NOTE — Telephone Encounter (Signed)
Please have her come do urine drop off.   Thanks, Pattricia Boss

## 2020-04-13 NOTE — Telephone Encounter (Signed)
Please advise. Thanks Breahna Boylen 

## 2020-04-14 ENCOUNTER — Other Ambulatory Visit: Payer: Self-pay

## 2020-04-14 ENCOUNTER — Ambulatory Visit (INDEPENDENT_AMBULATORY_CARE_PROVIDER_SITE_OTHER): Admitting: Certified Nurse Midwife

## 2020-04-14 DIAGNOSIS — N399 Disorder of urinary system, unspecified: Secondary | ICD-10-CM

## 2020-04-14 LAB — POCT URINALYSIS DIPSTICK OB
Bilirubin, UA: NEGATIVE
Blood, UA: NEGATIVE
Glucose, UA: NEGATIVE
Ketones, UA: NEGATIVE
Leukocytes, UA: NEGATIVE
Nitrite, UA: NEGATIVE
POC,PROTEIN,UA: NEGATIVE
Spec Grav, UA: 1.01 (ref 1.010–1.025)
Urobilinogen, UA: 0.2 E.U./dL
pH, UA: 6.5 (ref 5.0–8.0)

## 2020-04-14 NOTE — Progress Notes (Signed)
Pt not seen by provider, urine drop off only .  Doreene Burke, CNM

## 2020-04-15 ENCOUNTER — Other Ambulatory Visit: Payer: Self-pay

## 2020-04-15 ENCOUNTER — Encounter: Payer: Self-pay | Admitting: Certified Nurse Midwife

## 2020-04-15 ENCOUNTER — Other Ambulatory Visit (HOSPITAL_COMMUNITY)
Admission: RE | Admit: 2020-04-15 | Discharge: 2020-04-15 | Disposition: A | Discharge status: Home / Self Care | Source: Ambulatory Visit | Attending: Certified Nurse Midwife | Admitting: Certified Nurse Midwife

## 2020-04-15 ENCOUNTER — Ambulatory Visit (INDEPENDENT_AMBULATORY_CARE_PROVIDER_SITE_OTHER): Admitting: Certified Nurse Midwife

## 2020-04-15 VITALS — BP 110/72 | HR 78 | Wt 210.8 lb

## 2020-04-15 DIAGNOSIS — N898 Other specified noninflammatory disorders of vagina: Secondary | ICD-10-CM | POA: Insufficient documentation

## 2020-04-15 DIAGNOSIS — Z8759 Personal history of other complications of pregnancy, childbirth and the puerperium: Secondary | ICD-10-CM

## 2020-04-15 DIAGNOSIS — R109 Unspecified abdominal pain: Secondary | ICD-10-CM

## 2020-04-15 DIAGNOSIS — O26899 Other specified pregnancy related conditions, unspecified trimester: Secondary | ICD-10-CM | POA: Insufficient documentation

## 2020-04-15 DIAGNOSIS — O26891 Other specified pregnancy related conditions, first trimester: Secondary | ICD-10-CM | POA: Insufficient documentation

## 2020-04-15 DIAGNOSIS — O99891 Other specified diseases and conditions complicating pregnancy: Secondary | ICD-10-CM

## 2020-04-15 DIAGNOSIS — M549 Dorsalgia, unspecified: Secondary | ICD-10-CM

## 2020-04-15 DIAGNOSIS — Z8739 Personal history of other diseases of the musculoskeletal system and connective tissue: Secondary | ICD-10-CM

## 2020-04-15 NOTE — Progress Notes (Signed)
Pt is G7P0242 13 wks 1 day that presents for discharge and cramping. Pt state she has been seeing chunks of tissue when she voids and has been having lower abdominal cramping. She has a history of miscarriage and is anxious today. She denies any vaginal bleeding.   Objective: FHT auscultated 155 . Sterile spec exam, no blood, cervix appears closed and pink, white discharge noted no odor.  Assessment:  Cramping in first trimester pregnancy Vaginal discharge  Plan: Swab collected, will follow up with result. Discussed normal discomforts in pregnancy and cramping, Encouraged tylenol , heat/ice to her back , rest, hydration. She verbalizes and agrees to plan.  Red flag symptoms reviewed. PT encouraged to go to ED for intensifying cramps and or vaginal bleeding.   Sydney James, CNM

## 2020-04-15 NOTE — Progress Notes (Signed)
[name]: She is here because she has some white clots in her urine.

## 2020-04-17 LAB — CERVICOVAGINAL ANCILLARY ONLY
Bacterial Vaginitis (gardnerella): NEGATIVE
Candida Glabrata: NEGATIVE
Candida Vaginitis: NEGATIVE
Comment: NEGATIVE
Comment: NEGATIVE
Comment: NEGATIVE

## 2020-04-17 LAB — URINE CULTURE

## 2020-04-24 ENCOUNTER — Encounter: Payer: Self-pay | Admitting: Internal Medicine

## 2020-04-24 DIAGNOSIS — Z8669 Personal history of other diseases of the nervous system and sense organs: Secondary | ICD-10-CM

## 2020-04-25 NOTE — Telephone Encounter (Signed)
Order placed for ENT referral.   

## 2020-05-08 ENCOUNTER — Other Ambulatory Visit: Payer: Self-pay | Admitting: Certified Nurse Midwife

## 2020-05-08 ENCOUNTER — Encounter: Payer: Self-pay | Admitting: Certified Nurse Midwife

## 2020-05-08 ENCOUNTER — Other Ambulatory Visit: Payer: Self-pay

## 2020-05-08 ENCOUNTER — Ambulatory Visit (INDEPENDENT_AMBULATORY_CARE_PROVIDER_SITE_OTHER): Admitting: Certified Nurse Midwife

## 2020-05-08 VITALS — BP 114/81 | HR 103 | Wt 212.5 lb

## 2020-05-08 DIAGNOSIS — Z3689 Encounter for other specified antenatal screening: Secondary | ICD-10-CM

## 2020-05-08 DIAGNOSIS — O219 Vomiting of pregnancy, unspecified: Secondary | ICD-10-CM

## 2020-05-08 DIAGNOSIS — O09892 Supervision of other high risk pregnancies, second trimester: Secondary | ICD-10-CM | POA: Insufficient documentation

## 2020-05-08 DIAGNOSIS — Z3402 Encounter for supervision of normal first pregnancy, second trimester: Secondary | ICD-10-CM

## 2020-05-08 DIAGNOSIS — Z3A16 16 weeks gestation of pregnancy: Secondary | ICD-10-CM

## 2020-05-08 DIAGNOSIS — Z98891 History of uterine scar from previous surgery: Secondary | ICD-10-CM

## 2020-05-08 DIAGNOSIS — O10919 Unspecified pre-existing hypertension complicating pregnancy, unspecified trimester: Secondary | ICD-10-CM | POA: Insufficient documentation

## 2020-05-08 DIAGNOSIS — O0990 Supervision of high risk pregnancy, unspecified, unspecified trimester: Secondary | ICD-10-CM

## 2020-05-08 DIAGNOSIS — O09292 Supervision of pregnancy with other poor reproductive or obstetric history, second trimester: Secondary | ICD-10-CM | POA: Insufficient documentation

## 2020-05-08 DIAGNOSIS — Z8739 Personal history of other diseases of the musculoskeletal system and connective tissue: Secondary | ICD-10-CM

## 2020-05-08 LAB — POCT URINALYSIS DIPSTICK OB
Bilirubin, UA: NEGATIVE
Blood, UA: NEGATIVE
Glucose, UA: NEGATIVE
Ketones, UA: NEGATIVE
Leukocytes, UA: NEGATIVE
Nitrite, UA: NEGATIVE
POC,PROTEIN,UA: NEGATIVE
Spec Grav, UA: 1.01 (ref 1.010–1.025)
Urobilinogen, UA: 0.2 E.U./dL
pH, UA: 6.5 (ref 5.0–8.0)

## 2020-05-08 MED ORDER — ONDANSETRON 4 MG PO TBDP: 4.0000 mg | ORAL_TABLET | Freq: Four times a day (QID) | ORAL | 0 refills | Status: DC | PRN

## 2020-05-08 NOTE — Patient Instructions (Addendum)

## 2020-05-08 NOTE — Progress Notes (Signed)
[name]- reports constant nausea,  daily vomiting and dry heaving, and cramping with exertion. Discussed home treatment measures including use of abdominal support. Referral to physical therapy, see orders. Rx: Zofran, see chart. Referral to MFM for ANATOMY SCAN. Anticipatory guidance regarding course of prenatal care. Reviewed red flag symptoms and when to call. Anesthesia consult due to spine pathology scheduled for 05/26/2020 at noon.  RTC x 4 weeks for [name] with ANNIE or sooner if needed.   Juliann Pares, Student-MidWife [employer] 05/08/20 11:24 AM

## 2020-05-08 NOTE — Progress Notes (Signed)
[name]: She is not feeling well today, has some nausea, but no new concerns.

## 2020-05-08 NOTE — Progress Notes (Signed)
I have seen, interviewed, and examined the patient in conjunction with the [employer] and affirm the diagnosis and management plan.   Gunnar Bulla, CNM [facility], [facility] 05/08/20 2:15 PM

## 2020-05-20 ENCOUNTER — Ambulatory Visit (INDEPENDENT_AMBULATORY_CARE_PROVIDER_SITE_OTHER): Admitting: Certified Nurse Midwife

## 2020-05-20 ENCOUNTER — Other Ambulatory Visit: Payer: Self-pay

## 2020-05-20 ENCOUNTER — Encounter: Payer: Self-pay | Admitting: Certified Nurse Midwife

## 2020-05-20 VITALS — BP 124/79 | HR 102

## 2020-05-20 DIAGNOSIS — Z3402 Encounter for supervision of normal first pregnancy, second trimester: Secondary | ICD-10-CM

## 2020-05-20 DIAGNOSIS — Z3A18 18 weeks gestation of pregnancy: Secondary | ICD-10-CM

## 2020-05-20 LAB — POCT URINALYSIS DIPSTICK OB
Bilirubin, UA: NEGATIVE
Blood, UA: POSITIVE
Glucose, UA: NEGATIVE
Ketones, UA: NEGATIVE
Leukocytes, UA: NEGATIVE
Nitrite, UA: NEGATIVE
POC,PROTEIN,UA: NEGATIVE
Spec Grav, UA: 1.01 (ref 1.010–1.025)
Urobilinogen, UA: 0.2 E.U./dL
pH, UA: 7.5 (ref 5.0–8.0)

## 2020-05-20 NOTE — Progress Notes (Signed)
[name]: Her for bp check. She feels ok, just tired. She has been having a lot of cramps. No new concerns today.

## 2020-05-20 NOTE — Progress Notes (Signed)
Pt presents for problem visit. She states she has been monitoring her Bps at home and when she is resting they are around upper 130's / upper 80's. With normal activity around the house they increase to 140's /90-100 pt states she can tell because she starts to not feel well and as to sit down. BP today normal. PT state she had a Salt float this morning that maybe the cause of BP today. Dr. Valentino Saxon consulted. Pt to follow up 1 wk for repeat BP check, she is to bring in her cuff with her so we can check accuracy of her at home cuff. Pt notified of plan .   Doreene Burke, CNM

## 2020-05-25 ENCOUNTER — Other Ambulatory Visit: Payer: Self-pay

## 2020-05-25 ENCOUNTER — Ambulatory Visit

## 2020-05-25 ENCOUNTER — Encounter: Payer: Self-pay | Admitting: Certified Nurse Midwife

## 2020-05-25 ENCOUNTER — Ambulatory Visit (INDEPENDENT_AMBULATORY_CARE_PROVIDER_SITE_OTHER): Admitting: Certified Nurse Midwife

## 2020-05-25 VITALS — BP 145/86 | HR 90 | Wt 215.4 lb

## 2020-05-25 DIAGNOSIS — Z3A18 18 weeks gestation of pregnancy: Secondary | ICD-10-CM

## 2020-05-25 DIAGNOSIS — Z3482 Encounter for supervision of other normal pregnancy, second trimester: Secondary | ICD-10-CM

## 2020-05-25 DIAGNOSIS — R319 Hematuria, unspecified: Secondary | ICD-10-CM

## 2020-05-25 LAB — POCT URINALYSIS DIPSTICK OB
Bilirubin, UA: NEGATIVE
Glucose, UA: NEGATIVE
Ketones, UA: NEGATIVE
Leukocytes, UA: NEGATIVE
Nitrite, UA: NEGATIVE
POC,PROTEIN,UA: NEGATIVE
Spec Grav, UA: 1.01 (ref 1.010–1.025)
Urobilinogen, UA: 0.2 E.U./dL
pH, UA: 7 (ref 5.0–8.0)

## 2020-05-25 NOTE — Addendum Note (Signed)
Addended by: Dorian Pod on: 05/25/2020 11:31 AM   Modules accepted: Orders

## 2020-05-25 NOTE — Progress Notes (Signed)
Pt presents today for problem visit, notes small amount of blood in toilet with voiding . She denies UTI symptoms. ON dip today blood present. Discussed likely hood of it being blood from cervix but will send urine for culture. Also states she has not fel movement since Thursday. FHT auscultated today 140's. Reassurance given. PT to follow up on Friday for BP check as previously scheduled.   Doreene Burke, CNM

## 2020-05-26 ENCOUNTER — Other Ambulatory Visit
Admission: RE | Admit: 2020-05-26 | Discharge: 2020-05-26 | Disposition: A | Discharge status: Home / Self Care | Source: Ambulatory Visit | Attending: Anesthesiology | Admitting: Anesthesiology

## 2020-05-26 NOTE — Consult Note (Signed)
[facility] Anesthesia Consultation  Sydney James EQA:[id number] DOB: 1995/12/14 DOA: 05/26/2020 PCP: Dale Central, MD   Requesting physician: Doreene Burke, CNM Date of consultation: 05/26/20 Reason for consultation: Hx of preeclampsia, need for urgent cesarean delivery  CHIEF COMPLAINT:  Hx of preeclampsia  HISTORY OF PRESENT ILLNESS: Sydney James  is a 25 y.o. female with a known history of preeclampsia in both prior pregnancies. Required cesarean delivery with her last baby due to fetal intolerance of labor. Procedure was done urgently and pt had a lot of anxiety during the procedure. Comes today to discuss delivery planning and what might be done differently in terms of her anxiety.   PAST MEDICAL HISTORY:   Past Medical History:  Diagnosis Date  . Lumbar herniated disc   . Migraine   . Preeclampsia   . PVC's (premature ventricular contractions)   . Raynaud's disease   . Scoliosis    Back brace for 2 years  . Scoliosis     PAST SURGICAL HISTORY:  Past Surgical History:  Procedure Laterality Date  . CESAREAN SECTION N/A 12/22/2017   Procedure: CESAREAN SECTION;  Surgeon: Linzie Collin, MD;  Location: [facility] ORS;  Service: Obstetrics;  Laterality: N/A;  Female Born @ 64    . DILATION AND CURETTAGE OF UTERUS    . DILATION AND EVACUATION N/A 03/15/2016   Procedure: DILATATION AND EVACUATION;  Surgeon: Herold Harms, MD;  Location: [facility] ORS;  Service: Gynecology;  Laterality: N/A;  . DILATION AND EVACUATION N/A 07/18/2019   Procedure: DILATATION AND EVACUATION;  Surgeon: Hildred Laser, MD;  Location: [facility] ORS;  Service: Gynecology;  Laterality: N/A;  . WISDOM TOOTH EXTRACTION      SOCIAL HISTORY:  Social History   Tobacco Use  . Smoking status: Never Smoker  . Smokeless tobacco: Never Used  Substance Use Topics  . Alcohol use: No    FAMILY HISTORY:  Family History  Problem Relation Age of Onset  .  Hypertension Father   . Hypertension Mother   . Early death Mother   . Cancer Maternal Grandmother   . Cancer Maternal Grandfather   . Diabetes Maternal Grandfather   . Cancer Paternal Grandmother   . Diabetes Paternal Grandfather     DRUG ALLERGIES:  Allergies  Allergen Reactions  . Aloe Hives  . Lubricants     Has to have water based lubricant. Allergy to non-water based lubricant  . Monistat [Miconazole]   . Sulfa Antibiotics Itching and Rash  . Tape Itching and Rash    REVIEW OF SYSTEMS:   HEMATOLOGY: No anemia, easy bruising or bleeding SKIN: No rash or lesion. NEUROLOGIC: No tingling, numbness, weakness.  PSYCHIATRY: No anxiety or depression.   MEDICATIONS AT HOME:  Prior to Admission medications   Medication Sig Start Date End Date Taking? Authorizing Provider  aspirin EC 81 MG tablet Take 1 tablet (81 mg total) by mouth daily. Swallow whole. Start at [redacted] wks pregnant 04/08/20   Doreene Burke, CNM  ferrous sulfate 324 MG TBEC Take 324 mg by mouth.    [provider]  gabapentin (NEURONTIN) 100 MG capsule Take 1 capsule (100 mg total) by mouth 2 (two) times daily. 04/08/20   Doreene Burke, CNM  magnesium 30 MG tablet Take 30 mg by mouth 2 (two) times daily.    [provider]  ondansetron (ZOFRAN ODT) 4 MG disintegrating tablet Take 1 tablet (4 mg total) by mouth every 6 (six) hours as needed for nausea. 05/08/20  [place], Vanessa [place], CNM  Prenatal Vit-Fe Fumarate-FA (MULTIVITAMIN-PRENATAL) 27-0.8 MG TABS tablet Take 1 tablet by mouth daily at 12 noon.    [provider]      PHYSICAL EXAMINATION:   VITAL SIGNS: Last menstrual period 01/06/2020, unknown if currently breastfeeding.  GENERAL:  25 y.o.-year-old patient no acute distress.  LUNGS: No use of accessory muscles of respiration.   EXTREMITIES: No pedal edema, cyanosis, or clubbing.  NEUROLOGIC: normal gait PSYCHIATRIC: The patient is alert and oriented x 3.  SKIN: No obvious  rash, lesion, or ulcer.    IMPRESSION AND PLAN:   Sydney James  is a 25 y.o. female presenting with hx of preeclampsia and anxiety during cesarean delivery. Planning for TOLAC with epidural for labor analgesia.   We discussed different methods of anesthesia for a cesarean delivery. She had a lot of anxiety during her last procedure, partly due to the urgent nature of the procedure. She has had epidurals for both her vaginal delivery and for labor prior to her c-section. She had questions about general anesthesia for a cesarean delivery and we discussed the reasons we try to avoid general anesthesia if possible (risk of difficult airway, placental transfer of anesthetics, increased risk of PPH from uterine atony, increased difficulty with establishing breast feeding). We discussed that anti-anxiety medications could be given during a c-section done under neuraxial anesthesia. We discussed that we typically try to delay giving these medications prior to delivery due to placental transfer, but that if she was having significant anxiety they could be given before delivery as well. She had had well working epidurals in the past and would desire epidural for labor analgesia for TOLAC. We discussed use of an epidural vs spinal if need to proceed with cesarean delivery.   Appreciate having patient sent for consult so that we could discuss delivery and help alleviate additional anxiety for the patient should another cesarean delivery be required.

## 2020-05-27 ENCOUNTER — Other Ambulatory Visit: Payer: Self-pay | Admitting: Certified Nurse Midwife

## 2020-05-27 DIAGNOSIS — Z3689 Encounter for other specified antenatal screening: Secondary | ICD-10-CM

## 2020-05-27 DIAGNOSIS — O99212 Obesity complicating pregnancy, second trimester: Secondary | ICD-10-CM

## 2020-05-27 LAB — CULTURE, OB URINE

## 2020-05-27 LAB — URINE CULTURE, OB REFLEX

## 2020-05-29 ENCOUNTER — Ambulatory Visit (INDEPENDENT_AMBULATORY_CARE_PROVIDER_SITE_OTHER): Admitting: Surgical

## 2020-05-29 ENCOUNTER — Other Ambulatory Visit: Payer: Self-pay

## 2020-05-29 VITALS — BP 130/87 | HR 99 | Ht 67.0 in | Wt 215.9 lb

## 2020-05-29 DIAGNOSIS — R030 Elevated blood-pressure reading, without diagnosis of hypertension: Secondary | ICD-10-CM

## 2020-05-29 NOTE — Progress Notes (Signed)
Patient comes in today for BP check. Patient had elevated BP last week at home with her BP machine. BP WNL today at 130/87. Patient denies headache or blurred vision. I have checked her BP here with her machine. Her diastolic was 10 points higher than our machine.  Patient does not take BP medication.

## 2020-06-02 ENCOUNTER — Ambulatory Visit: Attending: Certified Nurse Midwife

## 2020-06-02 ENCOUNTER — Other Ambulatory Visit: Payer: Self-pay

## 2020-06-02 DIAGNOSIS — N3946 Mixed incontinence: Secondary | ICD-10-CM

## 2020-06-02 DIAGNOSIS — R278 Other lack of coordination: Secondary | ICD-10-CM | POA: Diagnosis present

## 2020-06-02 DIAGNOSIS — M5440 Lumbago with sciatica, unspecified side: Secondary | ICD-10-CM | POA: Insufficient documentation

## 2020-06-02 NOTE — Therapy (Signed)
[place] [facility] MAIN [facility] SERVICES [address] [place], [place], [address] Phone: [phone]   Fax:  [phone]  Physical Therapy Evaluation  Patient Details  Name: Sydney James MRN: [id number] Date of Birth: September 12, 1995 Referring Provider (PT): Gunnar Bulla CNM   Encounter Date: 06/02/2020   PT End of Session - 06/02/20 0924    Visit Number 1    Number of Visits 10    Date for PT Re-Evaluation 08/31/20    Authorization Type [employer]    PT Start Time 0807   pt arrived late   PT Stop Time 0904    PT Time Calculation (min) 57 min    Activity Tolerance Patient tolerated treatment well    Behavior During Therapy [facility] for tasks assessed/performed           Past Medical History:  Diagnosis Date  . Lumbar herniated disc   . Migraine   . Preeclampsia   . PVC's (premature ventricular contractions)   . Raynaud's disease   . Scoliosis    Back brace for 2 years  . Scoliosis     Past Surgical History:  Procedure Laterality Date  . CESAREAN SECTION N/A 12/22/2017   Procedure: CESAREAN SECTION;  Surgeon: Linzie Collin, MD;  Location: [facility] ORS;  Service: Obstetrics;  Laterality: N/A;  Female Born @ 25    . DILATION AND CURETTAGE OF UTERUS    . DILATION AND EVACUATION N/A 03/15/2016   Procedure: DILATATION AND EVACUATION;  Surgeon: Herold Harms, MD;  Location: [facility] ORS;  Service: Gynecology;  Laterality: N/A;  . DILATION AND EVACUATION N/A 07/18/2019   Procedure: DILATATION AND EVACUATION;  Surgeon: Hildred Laser, MD;  Location: [facility] ORS;  Service: Gynecology;  Laterality: N/A;  . WISDOM TOOTH EXTRACTION      There were no vitals filed for this visit.    Subjective Assessment - 06/02/20 0814    Subjective Pt is currently [redacted] weeks pregnant with hx of preeclampsia with each pregnancy. Pt has hx of four miscarriages. Pt reported she has a hx of back pain (DDD-lx spine) and hx of two births (vaginal and one  c-section) and she's experiencing more pressure and cramping this pregnancy. Sexual Function: Pt feels like her uterus is going to "fall out" during squatting and intercourse is uncomfortable when she attempts to contract pelvic floor. Urinary: Pt experiences leaking and has to change underwear 2-3x/week due to leaking, especially with urge and coughing/sneezing. Pt has been experiencing intermittent borderline incr. in BP (130s/140s/80s) and some blood with urine. OB is aware and feels it's from cervix as bleeding occurs after activity (walking). Core: Pt is wearing a band to support back for DDD with radiating pain to RLE. Bowel: pt states antibiotics for first delivery caused diarrhea and it has not stopped since first birth (always watery), once a day at night.    Pertinent History Hx of c-section, DDD (Lx spine), Vitamin D deficiency, hx of migraines    How long can you sit comfortably? Max of 30 minutes    How long can you stand comfortably? 5-10 minutes max    How long can you walk comfortably? 0 minutes-it's always uncomfortable    Patient Stated Goals Get the strength back, not having to pee every time I sneeze, decrease pain/pressure off hips    Currently in Pain? Yes    Pain Score --   unable to rate pressure   Pain Location Abdomen    Pain Orientation Distal  Pain Descriptors / Indicators Pressure    Pain Type Acute pain;Chronic pain    Pain Onset More than a month ago    Pain Frequency Constant    Aggravating Factors  sustained, walking, squatting    Pain Relieving Factors changing positions              [facility] PT Assessment - 06/02/20 0922      Assessment   Medical Diagnosis Pregnancy    Referring Provider (PT) Gunnar Bulla CNM    Onset Date/Surgical Date 05/08/20    Prior Therapy none      Precautions   Precautions Other (comment)    Precaution Comments HTN, limit activity 2/2 bleeding            Pelvic Floor Physical Therapy Evaluation and  Assessment  Screenings: Red Flags: Have you had any night sweats? Yes, during pregnancy Unexplained weight loss? No Saddle anesthesia? no Unexplained changes in bowel or bladder changes? No   SUBJECTIVE  Patient reports:  See subjective.   Precautions:  High BP, some blood in urine with incr. Activity.   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   Recent Procedures/Tests/Findings:  Pt told to monitor BP at home and rest as needed 2/2 bleeding incr. With activity.  Obstetrical History:  See subjective  Gynecological History: See subjective  Urinary History: Pt with incontinence (stress and urge), pt urinates 2-3x/ in 2-4 hour period and now 6-7x/2-4 hours while pregnant.   Gastrointestinal History: See subjective Sexual activity/pain: See subjective   OBJECTIVE  Posture/Observations:  Sitting: Sits to L side Standing: Incr. Lx lordosis with hx of scoliosis-wore back brace for two years when 25 y/o  Palpation/Segmental Motion/Joint Play:  Special tests:  Trendelenburg noted during both R and L SLS.   Range of Motion/Flexibilty:  Spine: Hips:   Strength/MMT:  LE MMT  LE MMT Left Right  Hip flex:  (L2) /5 /5  Hip ext: /5 /5  Hip abd: 4/5 4-/5  Hip add: /5 /5  Hip IR /5 /5  Hip ER /5 /5     Abdominal:  Palpation: no tenderness Diastasis: no coning noted  Pelvic Floor External Exam: Introitus Appears:  Skin integrity:  Palpation: Cough: Prolapse visible?: Scar mobility: pt reported she did some scar mobilization after c-section 2 years ago. Through clothing: no tenderness Ischial tuberosities: no tenderness Palpation for pelvic floor contraction: trace contraction noted through clothes for 1 sec. Coccyx: tenderness on R side with concordant RLE pain  Palpation: Prolapse:   Gait Analysis:   Pelvic Floor Outcome Measures: Not performed  INTERVENTIONS THIS SESSION:  HEP provided, see below as PT  educated pt on proper body mechanics, breathing, sleep positions, and floor<>stand txfs.    Diaphragmatic Breathing at 90/90 Supported     REPS: 5 SETS: 1  DAILY: 3 WEEKLY: 7    Healthy Posture: How to Hold and Lift a Forensic scientist and Lowering Baby to a Changing Table  Squat Lift with a Baby     Disclaimer: This program provides exercises related to preventative maintenance OR to your condition that you can perform at home. As there is a risk of injury with any activity, use caution when performing exercises. If you experience any pain or discomfort, discontinue the exercises and contact your healthcare provider. Login: [email or web address]  .  Access Code: [email or web address] .   Date printed: 05/31/2022Page 11 Household  Activities     Lifting Techniques     Sleep Positions                 Objective measurements completed on examination: See above findings.               PT Education - 06/02/20 0924    Education Details PT educated pt on POC, frequency, and duration. PT provided pt with HEP: diaphragmatic breathing and proper posture during lifting and caring for other children. PT also demonstrated proper floor<>stand txf to reduce back pain and decr. torsion at hips.    Person(s) Educated Patient    Methods Explanation;Demonstration;Verbal cues;Handout    Comprehension Verbalized understanding            PT Short Term Goals - 06/02/20 0933      PT SHORT TERM GOAL #1   Title Pt will be IND in HEP to improve body mechanics, posture and strength. TARGET DATE FOR ALL STGS: 07/07/20    Baseline No HEP    Time 5    Period Weeks    Status New      PT SHORT TERM GOAL #2   Title Pt will be able to amb. for 10 minutes without incr. in back/hip pain in order to care for other children.    Baseline 0 minutes    Time 5    Period Weeks    Status New      PT SHORT TERM GOAL #3   Title Have pt complete FOTO.    Time 5     Period Weeks    Status New             PT Long Term Goals - 06/02/20 0936      PT LONG TERM GOAL #2   Title Pt will amb. for 20 minutes without incr. in back/hip pain in order to perform ADLs and care for children.    Baseline 0 minutes    Time 10    Period Weeks    Status New      PT LONG TERM GOAL #3   Title Pt will report 1 change of underwear/week 2/2 incontinence.    Baseline 2-3/week change of underwear 2/2 incontinence.    Time 10    Period Weeks    Status New      PT LONG TERM GOAL #4   Title Pt will report no pressure during internal OB exam and intercourse in order to improve QOL and reduce pain.    Time 10    Period Weeks    Status New                  Plan - 06/02/20 0925    Clinical Impression Statement Pt is a pleasant 25 y/o female presenting to [facility] PT for pain and incontinence during pregnancy-[redacted] weeks gestation today. Pt's PMH is significant for the following: Hx of c-section, DDD (Lx spine), Vitamin D deficiency, hx of migraines, hx of HTN during pregnancy. The following impairments were noted upon exam: decr. strength, pain, postural dysfunction, improper body mechanics. Pt would benefit from skilled PT to improve deficits listed above in order to improve safety during ADLS and quality of life.    Personal Factors and Comorbidities Comorbidity 3+;Fitness;Past/Current Experience;Time since onset of injury/illness/exacerbation;Profession    Comorbidities Hx of c-section, DDD (Lx spine), Vitamin D deficiency, hx of migraines, hx of HTN during pregnancy    Examination-Activity Limitations Bed Mobility;Bend;Caring for Others;Carry;Continence;Dressing;Lift;Toileting;Stand;Stairs;Squat;Sleep;Sit;Locomotion Level;Transfers  Examination-Participation Restrictions Meal Prep;Cleaning;Church;Interpersonal Relationship;Laundry    Stability/Clinical Decision Making Evolving/Moderate complexity    Clinical Decision Making Moderate    Rehab Potential Good    PT  Frequency 1x / week    PT Duration Other (comment)   10 weeks   PT Treatment/Interventions ADLs/Self Care Home Management;Aquatic Therapy;Biofeedback;Gait training;Stair training;Functional mobility training;Therapeutic activities;Therapeutic exercise;Balance training;Neuromuscular re-education;Manual techniques;Patient/family education;Scar mobilization;Dry needling;Joint Manipulations   manual therapy with caution 2/2 pregnancy   PT Next Visit Plan PT will review HEP and progress as indicated. Hip/LE strength training, manual therapy, seated on physioball (tilts/clocks)    PT Home Exercise Plan Medbridge: [id number]    Consulted and Agree with Plan of Care Patient           Patient will benefit from skilled therapeutic intervention in order to improve the following deficits and impairments:  Abnormal gait,Decreased endurance,Pain,Decreased balance,Improper body mechanics,Postural dysfunction,Decreased strength,Decreased mobility  Visit Diagnosis: Mixed incontinence - Plan: PT plan of care cert/re-cert  Other lack of coordination - Plan: PT plan of care cert/re-cert  Acute right-sided low back pain with sciatica, sciatica laterality unspecified - Plan: PT plan of care cert/re-cert     Problem List Patient Active Problem List   Diagnosis Date Noted  . History of preterm delivery, currently pregnant in second trimester 05/08/2020  . Chronic hypertension affecting pregnancy 05/08/2020  . History of preterm premature rupture of membranes (PROM) in previous pregnancy, currently pregnant in second trimester 05/08/2020  . Supervision of high risk pregnancy, antepartum 04/08/2020  . DDD (degenerative disc disease), lumbar 11/30/2019  . History of migraine headaches 11/30/2019  . Essential hypertension 09/05/2019  . Sleep disturbance 09/05/2019  . Obesity 02/27/2019  . Vitamin D deficiency 11/02/2016    Neeley Sedivy L 06/02/2020, 1:14 PM  [place] [facility]  MAIN [facility] [address] [place], [place], [address] Phone: [phone]   Fax:  [phone]  Name: Joella PrinceMichaela Crass MRN: [id number] Date of Birth: Nov 01, 1995  Zerita BoersJennifer Tocarra Gassen, PT,DPT 06/02/20 1:14 PM Phone: [phone] Fax: [phone]

## 2020-06-02 NOTE — Therapy (Deleted)
Pelvic Floor Physical Therapy Evaluation and Assessment  Screenings: Red Flags: Have you had any night sweats? Yes, during pregnancy Unexplained weight loss? No Saddle anesthesia? no Unexplained changes in bowel or bladder changes? No   SUBJECTIVE  Patient reports:  See subjective.   Precautions:  High BP, some blood in urine with incr. Activity.   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   Recent Procedures/Tests/Findings:  Pt told to monitor BP at home and rest as needed 2/2 bleeding incr. With activity.  Obstetrical History:  See subjective  Gynecological History: See subjective  Urinary History: Pt with incontinence (stress and urge), pt urinates 2-3x/ in 2-4 hour period and now 6-7x/2-4 hours while pregnant.   Gastrointestinal History: See subjective Sexual activity/pain: See subjective   OBJECTIVE  Posture/Observations:  Sitting:  Standing:   Palpation/Segmental Motion/Joint Play:  Special tests:     Range of Motion/Flexibilty:  Spine: Hips:   Strength/MMT:  LE MMT  LE MMT Left Right  Hip flex:  (L2) /5 /5  Hip ext: /5 /5  Hip abd: /5 /5  Hip add: /5 /5  Hip IR /5 /5  Hip ER /5 /5     Abdominal:  Palpation: Diastasis:  Pelvic Floor External Exam: Introitus Appears:  Skin integrity:  Palpation: Cough: Prolapse visible?: Scar mobility: Through clothing: Ischial tuberosities: Palpation for pelvic floor contraction: Coccyx:  Internal Vaginal Exam: Strength (PERF):  Symmetry: Palpation: Prolapse:   Internal Rectal Exam: Strength (PERF): Symmetry: Palpation: Prolapse:   Gait Analysis:   Pelvic Floor Outcome Measures: ***  INTERVENTIONS THIS SESSION:   Total time:

## 2020-06-02 NOTE — Therapy (Deleted)
[place] [facility] MAIN [facility] SERVICES [address] [place], [place], [address] Phone: [phone]   Fax:  [phone]  Physical Therapy Evaluation  Patient Details  Name: Sydney James MRN: [id number] Date of Birth: 09-04-1995 Referring Provider (PT): Gunnar Bulla CNM   Encounter Date: 06/02/2020   PT End of Session - 06/02/20 0924    Visit Number 1    Number of Visits 10    Date for PT Re-Evaluation 08/31/20    Authorization Type [employer]    PT Start Time 0807   pt arrived late   PT Stop Time 0904    PT Time Calculation (min) 57 min    Activity Tolerance Patient tolerated treatment well    Behavior During Therapy [facility] for tasks assessed/performed           Past Medical History:  Diagnosis Date  . Lumbar herniated disc   . Migraine   . Preeclampsia   . PVC's (premature ventricular contractions)   . Raynaud's disease   . Scoliosis    Back brace for 2 years  . Scoliosis     Past Surgical History:  Procedure Laterality Date  . CESAREAN SECTION N/A 12/22/2017   Procedure: CESAREAN SECTION;  Surgeon: Linzie Collin, MD;  Location: [facility] ORS;  Service: Obstetrics;  Laterality: N/A;  Female Born @ 23    . DILATION AND CURETTAGE OF UTERUS    . DILATION AND EVACUATION N/A 03/15/2016   Procedure: DILATATION AND EVACUATION;  Surgeon: Herold Harms, MD;  Location: [facility] ORS;  Service: Gynecology;  Laterality: N/A;  . DILATION AND EVACUATION N/A 07/18/2019   Procedure: DILATATION AND EVACUATION;  Surgeon: Hildred Laser, MD;  Location: [facility] ORS;  Service: Gynecology;  Laterality: N/A;  . WISDOM TOOTH EXTRACTION      There were no vitals filed for this visit.    Subjective Assessment - 06/02/20 0814    Subjective Pt is currently [redacted] weeks pregnant with hx of preeclampsia with each pregnancy. Pt has hx of four miscarriages. Pt reported she has a hx of back pain (DDD-lx spine) and hx of two births (vaginal and one  c-section) and she's experiencing more pressure and cramping this pregnancy. Sexual Function: Pt feels like her uterus is going to "fall out" during squatting and intercourse is uncomfortable when she attempts to contract pelvic floor. Urinary: Pt experiences leaking and has to change underwear 2-3x/week due to leaking, especially with urge and coughing/sneezing. Pt has been experiencing intermittent borderline incr. in BP (130s/140s/80s) and some blood with urine. OB is aware and feels it's from cervix as bleeding occurs after activity (walking). Core: Pt is wearing a band to support back for DDD with radiating pain to RLE. Bowel: pt states antibiotics for first delivery caused diarrhea and it has not stopped since first birth (always watery), once a day at night.    Pertinent History Hx of c-section, DDD (Lx spine), Vitamin D deficiency, hx of migraines    How long can you sit comfortably? Max of 30 minutes    How long can you stand comfortably? 5-10 minutes max    How long can you walk comfortably? 0 minutes-it's always uncomfortable    Patient Stated Goals Get the strength back, not having to pee every time I sneeze, decrease pain/pressure off hips    Currently in Pain? Yes    Pain Score --   unable to rate pressure   Pain Location Abdomen    Pain Orientation Distal  Pain Descriptors / Indicators Pressure    Pain Type Acute pain;Chronic pain    Pain Onset More than a month ago    Pain Frequency Constant    Aggravating Factors  sustained, walking, squatting    Pain Relieving Factors changing positions              [facility] PT Assessment - 06/02/20 0922      Assessment   Medical Diagnosis Pregnancy    Referring Provider (PT) Gunnar Bulla CNM    Onset Date/Surgical Date 05/08/20    Prior Therapy none      Precautions   Precautions Other (comment)    Precaution Comments HTN, limit activity 2/2 bleeding                      Objective measurements completed  on examination: See above findings.               PT Education - 06/02/20 6761    Education Details PT provided pt with HEP: diaphragmatic breathing and proper posture during lifting and caring for other children. PT also demonstrated proper floor<>stand txf to reduce back pain and decr. torsion at hips.    Person(s) Educated Patient    Methods Explanation;Demonstration;Verbal cues;Handout    Comprehension Verbalized understanding            PT Short Term Goals - 06/02/20 0933      PT SHORT TERM GOAL #1   Title Pt will be IND in HEP to improve body mechanics, posture and strength. TARGET DATE FOR ALL STGS: 07/07/20    Baseline No HEP    Time 5    Period Weeks    Status New      PT SHORT TERM GOAL #2   Title Pt will be able to amb. for 10 minutes without incr. in back/hip pain in order to care for other children.    Baseline 0 minutes    Time 5    Period Weeks    Status New      PT SHORT TERM GOAL #3   Title Have pt complete FOTO.    Time 5    Period Weeks    Status New             PT Long Term Goals - 06/02/20 0936      PT LONG TERM GOAL #2   Title Pt will amb. for 20 minutes without incr. in back/hip pain in order to perform ADLs and care for children.    Baseline 0 minutes    Time 10    Period Weeks    Status New      PT LONG TERM GOAL #3   Title Pt will report 1 change of underwear/week 2/2 incontinence.    Baseline 2-3/week change of underwear 2/2 incontinence.    Time 10    Period Weeks    Status New      PT LONG TERM GOAL #4   Title Pt will report no pressure during internal OB exam and intercourse in order to improve QOL and reduce pain.    Time 10    Period Weeks    Status New                  Plan - 06/02/20 0925    Clinical Impression Statement Pt is a pleasant 25 y/o female presenting to [facility] PT for pain and incontinence during pregnancy-[redacted] weeks gestation today. Pt's PMH is significant  for the following: Hx of c-section, DDD  (Lx spine), Vitamin D deficiency, hx of migraines, hx of HTN during pregnancy. The following impairments were noted upon exam: decr. strength, pain, postural dysfunction, improper body mechanics. Pt would benefit from skilled PT to improve deficits listed above in order to improve safety during ADLS and quality of life.    Personal Factors and Comorbidities Comorbidity 3+;Fitness;Past/Current Experience;Time since onset of injury/illness/exacerbation;Profession    Comorbidities Hx of c-section, DDD (Lx spine), Vitamin D deficiency, hx of migraines, hx of HTN during pregnancy    Examination-Activity Limitations Bed Mobility;Bend;Caring for Others;Carry;Continence;Dressing;Lift;Toileting;Stand;Stairs;Squat;Sleep;Sit;Locomotion Level;Transfers    Examination-Participation Restrictions Meal Prep;Cleaning;Church;Interpersonal Relationship;Laundry    Stability/Clinical Decision Making Evolving/Moderate complexity    Clinical Decision Making Moderate    Rehab Potential Good    PT Frequency 1x / week    PT Duration Other (comment)   10 weeks   PT Treatment/Interventions ADLs/Self Care Home Management;Aquatic Therapy;Biofeedback;Gait training;Stair training;Functional mobility training;Therapeutic activities;Therapeutic exercise;Balance training;Neuromuscular re-education;Manual techniques;Patient/family education;Scar mobilization;Dry needling;Joint Manipulations   manual therapy with caution 2/2 pregnancy   PT Next Visit Plan PT will review HEP and progress as indicated. Hip/LE strength training, manual therapy, seated on physioball (tilts/clocks)    PT Home Exercise Plan Medbridge: [email or web address]    Consulted and Agree with Plan of Care Patient           Patient will benefit from skilled therapeutic intervention in order to improve the following deficits and impairments:  Abnormal gait,Decreased endurance,Pain,Decreased balance,Improper body mechanics,Postural dysfunction,Decreased strength,Decreased  mobility  Visit Diagnosis: Mixed incontinence - Plan: PT plan of care cert/re-cert  Other lack of coordination - Plan: PT plan of care cert/re-cert  Acute right-sided low back pain with sciatica, sciatica laterality unspecified - Plan: PT plan of care cert/re-cert     Problem List Patient Active Problem List   Diagnosis Date Noted  . History of preterm delivery, currently pregnant in second trimester 05/08/2020  . Chronic hypertension affecting pregnancy 05/08/2020  . History of preterm premature rupture of membranes (PROM) in previous pregnancy, currently pregnant in second trimester 05/08/2020  . Supervision of high risk pregnancy, antepartum 04/08/2020  . DDD (degenerative disc disease), lumbar 11/30/2019  . History of migraine headaches 11/30/2019  . Essential hypertension 09/05/2019  . Sleep disturbance 09/05/2019  . Obesity 02/27/2019  . Vitamin D deficiency 11/02/2016    Janele Lague L 06/02/2020, 10:06 AM  [place] [facility] MAIN [facility] SERVICES [address] [place], [place], [address] Phone: [phone]   Fax:  [phone]  Name: Sydney James MRN: [id number] Date of Birth: [birth year]

## 2020-06-04 ENCOUNTER — Other Ambulatory Visit: Payer: Self-pay

## 2020-06-04 ENCOUNTER — Ambulatory Visit: Attending: Maternal & Fetal Medicine

## 2020-06-04 DIAGNOSIS — Z3689 Encounter for other specified antenatal screening: Secondary | ICD-10-CM

## 2020-06-04 DIAGNOSIS — Z3A20 20 weeks gestation of pregnancy: Secondary | ICD-10-CM | POA: Diagnosis not present

## 2020-06-04 DIAGNOSIS — O99212 Obesity complicating pregnancy, second trimester: Secondary | ICD-10-CM | POA: Diagnosis present

## 2020-06-04 DIAGNOSIS — E669 Obesity, unspecified: Secondary | ICD-10-CM | POA: Diagnosis not present

## 2020-06-04 DIAGNOSIS — O34219 Maternal care for unspecified type scar from previous cesarean delivery: Secondary | ICD-10-CM | POA: Diagnosis not present

## 2020-06-04 DIAGNOSIS — O10912 Unspecified pre-existing hypertension complicating pregnancy, second trimester: Secondary | ICD-10-CM | POA: Diagnosis not present

## 2020-06-04 DIAGNOSIS — O10012 Pre-existing essential hypertension complicating pregnancy, second trimester: Secondary | ICD-10-CM | POA: Diagnosis not present

## 2020-06-05 ENCOUNTER — Other Ambulatory Visit: Payer: Self-pay | Admitting: Certified Nurse Midwife

## 2020-06-08 ENCOUNTER — Ambulatory Visit (INDEPENDENT_AMBULATORY_CARE_PROVIDER_SITE_OTHER): Admitting: Certified Nurse Midwife

## 2020-06-08 ENCOUNTER — Other Ambulatory Visit: Payer: Self-pay

## 2020-06-08 ENCOUNTER — Encounter: Payer: Self-pay | Admitting: Certified Nurse Midwife

## 2020-06-08 ENCOUNTER — Ambulatory Visit: Attending: Certified Nurse Midwife

## 2020-06-08 VITALS — BP 120/83 | HR 82 | Wt 217.2 lb

## 2020-06-08 VITALS — BP 122/75 | HR 94

## 2020-06-08 DIAGNOSIS — N3946 Mixed incontinence: Secondary | ICD-10-CM | POA: Insufficient documentation

## 2020-06-08 DIAGNOSIS — M5440 Lumbago with sciatica, unspecified side: Secondary | ICD-10-CM | POA: Insufficient documentation

## 2020-06-08 DIAGNOSIS — Z3A20 20 weeks gestation of pregnancy: Secondary | ICD-10-CM

## 2020-06-08 DIAGNOSIS — R278 Other lack of coordination: Secondary | ICD-10-CM | POA: Insufficient documentation

## 2020-06-08 DIAGNOSIS — Z3403 Encounter for supervision of normal first pregnancy, third trimester: Secondary | ICD-10-CM

## 2020-06-08 LAB — POCT URINALYSIS DIPSTICK OB
Bilirubin, UA: NEGATIVE
Glucose, UA: NEGATIVE
Ketones, UA: NEGATIVE
Leukocytes, UA: NEGATIVE
Nitrite, UA: NEGATIVE
POC,PROTEIN,UA: NEGATIVE
Spec Grav, UA: 1.01 (ref 1.010–1.025)
Urobilinogen, UA: 0.2 E.U./dL
pH, UA: 6.5 (ref 5.0–8.0)

## 2020-06-08 NOTE — Progress Notes (Signed)
[name] doing well. Feels good movement. Had anatomy u/s completed on 6/2, results reviewed. Pt has return visit scheduled for her repeat in 4 wks. BP good today, pt states they have been good all week. She denies any concerns Follow up 4 wk with Marcelino Duster for [name].   Doreene Burke, CNM

## 2020-06-08 NOTE — Progress Notes (Signed)
[name]: She has no concerns today.

## 2020-06-08 NOTE — Therapy (Addendum)
[place] [facility] MAIN [facility] [address] [place], [place], [address] Phone: [phone]   Fax:  [phone]  Physical Therapy Treatment  Patient Details  Name: Sydney James MRN: [id number] Date of Birth: 04/22/1995 Referring Provider (PT): Gunnar BullaLawhorn, Jenkins Michelle CNM   Encounter Date: 06/08/2020   PT End of Session - 06/08/20 0912    Visit Number 2    Number of Visits 10    Date for PT Re-Evaluation 08/31/20    Authorization Type Tric[employer]    PT Start Time 0801    PT Stop Time 0856    PT Time Calculation (min) 55 min    Activity Tolerance Patient tolerated treatment well;No increased pain    Behavior During Therapy WFL for tasks assessed/performed           Past Medical History:  Diagnosis Date  . Lumbar herniated disc   . Migraine   . Preeclampsia   . PVC's (premature ventricular contractions)   . Raynaud's disease   . Scoliosis    Back brace for 2 years  . Scoliosis     Past Surgical History:  Procedure Laterality Date  . CESAREAN SECTION N/A 12/22/2017   Procedure: CESAREAN SECTION;  Surgeon: Linzie CollinEvans, David James, MD;  Location: [facility] ORS;  Service: Obstetrics;  Laterality: N/A;  Female Born @ [id number]    . DILATION AND CURETTAGE OF UTERUS    . DILATION AND EVACUATION N/A 03/15/2016   Procedure: DILATATION AND EVACUATION;  Surgeon: Herold HarmsMartin A Defrancesco, MD;  Location: [facility] ORS;  Service: Gynecology;  Laterality: N/A;  . DILATION AND EVACUATION N/A 07/18/2019   Procedure: DILATATION AND EVACUATION;  Surgeon: Hildred Laserherry, Anika, MD;  Location: [facility] ORS;  Service: Gynecology;  Laterality: N/A;  . WISDOM TOOTH EXTRACTION      Vitals:   06/08/20 0808  BP: 122/75  Pulse: 94     Subjective Assessment - 06/08/20 0804    Subjective Pt reported she's been performing HEP at home and was a little sore but has been feeling better. Pt reported BP was in the 130-140s range/70-90s. Pt reported she's been able to hold urine for longer and  is going 3-4x in about 2-4 hours vs. 6-7x in 2-4 hours.    Pertinent History Hx of c-section, DDD (Lx spine), Vitamin D deficiency, hx of migraines    Patient Stated Goals Get the strength back, not having to pee every time I sneeze, decrease pain/pressure off hips    Currently in Pain? No/denies              NMR: Access Code: [email or web address] URL: [email or web address] Date: 06/08/2020 Prepared by: Zerita BoersJennifer Lutie Pickler  Exercises:  Diaphragmatic Breathing at 90/90 Supported - 3 x daily - 7 x weekly - 1 sets - 5 reps Child's Pose with Thread the Needle - 1-2 x daily - 7 x weekly - 1 sets - 5 reps - 10 hold Latissimus Dorsi Stretch at Wall - 1 x daily - 7 x weekly - 1 sets - 3 reps - 30 hold Supine Pelvic Floor Contraction - 1 x daily - 7 x weekly - 1 sets - 5-10 reps - 1-2 hold  Patient Education Healthy Posture: How to Hold and Lift a Futures traderBaby Lifting a Car Seat Lifting and Lowering Baby to a Changing Table Squat Lift with a Edison InternationalBaby Household Activities Lifting Techniques Sleep Positions  PT reviewed previous HEP (breathing and lifting mechanics and floor<>stand txfs). PT also added to pt's HEP and provided demo and verbal  cues for proper technique.              Pelvic Floor Special Questions - 06/08/20 2992    Are you Pregnant or attempting pregnancy? Yes    Urinary Leakage Yes    How often with coughing/sneezing    Urinary urgency Yes    Urinary frequency 3-4x/2 hour period vs. 6-7 times in 2 hour period    External Palpation Pt was able to perform stronger PF contraction after coccyx manual therapy. B pelvic floor muscles engaged and pt stated it felt easier to perform.             [facility] Adult PT Treatment/Exercise - 06/08/20 0905      Ambulation/Gait   Ambulation/Gait Yes    Ambulation/Gait Assistance 7: Independent    Ambulation Distance (Feet) 200 Feet    Assistive device None    Gait Pattern Decreased arm swing - right;Decreased arm swing -  left;Step-through pattern;Narrow base of support   B foot IR noted in stance and neutral in amb.   Ambulation Surface Level;Indoor    Gait Comments Pt noted to have sandals donned-no strap around heel.      Manual Therapy   Manual Therapy Joint mobilization;Soft tissue mobilization    Manual therapy comments Pt seated in chair with arms on back of chair as pt is pregnant and unable to lie supine. Pt noted to experience incr. tension in L paraspinals in lower Tx and Lx spine. S curve is convex in upper tx spine and lx spine. Coccyx deviated to R side.    Joint Mobilization PT applied PA sustained pressure to coccyx during 5 reps of pelvic floor contractions. Pt then able to perform stronger and more efficient contraction.    Soft tissue mobilization PT performed trigger point release and massage to L sided paraspinals. No pain reported during session.                Self Care:  PT Education - 06/08/20 0910    Education Details PT reviewed previous HEP and added to HEP. PT educated pt on wearing supportive shoes with strap around heel (for sandals) and tennis shoes are best-pt reported she has [employer] and [employer] at home. Pt reported LLE is longer than RLE, not noted during palpation of ASIS, will assess next session with pt in semi-recumbent position 2/2 pregnancy.    Person(s) Educated Patient    Methods Explanation;Demonstration;Verbal cues;Handout    Comprehension Returned demonstration;Verbalized understanding            PT Short Term Goals - 06/02/20 0933      PT SHORT TERM GOAL #1   Title Pt will be IND in HEP to improve body mechanics, posture and strength. TARGET DATE FOR ALL STGS: 07/07/20    Baseline No HEP    Time 5    Period Weeks    Status New      PT SHORT TERM GOAL #2   Title Pt will be able to amb. for 10 minutes without incr. in back/hip pain in order to care for other children.    Baseline 0 minutes    Time 5    Period Weeks    Status New      PT SHORT TERM  GOAL #3   Title Have pt complete FOTO.    Time 5    Period Weeks    Status New             PT Long Term Goals -  06/08/20 0915      PT LONG TERM GOAL #1   Title Pt will be IND in progressed HEP to improve posture, pain, body mechanics. TARGET DATE FOR ALL LTGS: 08/11/20    Baseline No HEP    Time 10    Period Weeks    Status New      PT LONG TERM GOAL #2   Title Pt will amb. for 20 minutes without incr. in back/hip pain in order to perform ADLs and care for children.    Baseline 0 minutes    Time 10    Period Weeks    Status New      PT LONG TERM GOAL #3   Title Pt will report 1 change of underwear/week 2/2 incontinence.    Baseline 2-3/week change of underwear 2/2 incontinence.    Time 10    Period Weeks    Status New      PT LONG TERM GOAL #4   Title Pt will report no pressure during internal OB exam and intercourse in order to improve QOL and reduce pain.    Time 10    Period Weeks    Status New                 Plan - 06/08/20 0912    Clinical Impression Statement Pt demonstrated progress today as she was able to perform stronger pelvic floor contraction after manual therapy to coccyx. PT provided pt with HEP to decr. back pain and tightness, as pt reported relief with manual therapy. Pt was also able to demonstrate TrA and pelvic floor contraction in seated position, which PT will have pt attempt to perform prior to coughing and sneezing to decr. leakage. Pt would continue to benefit from skilled PT to improve pain, leakage, and posture during all activities.    Personal Factors and Comorbidities Comorbidity 3+;Fitness;Past/Current Experience;Time since onset of injury/illness/exacerbation;Profession    Comorbidities Hx of c-section, DDD (Lx spine), Vitamin D deficiency, hx of migraines, hx of HTN during pregnancy    Examination-Activity Limitations Bed Mobility;Bend;Caring for  Others;Carry;Continence;Dressing;Lift;Toileting;Stand;Stairs;Squat;Sleep;Sit;Locomotion Level;Transfers    Examination-Participation Restrictions Meal Prep;Cleaning;Church;Interpersonal Relationship;Laundry    Stability/Clinical Decision Making Evolving/Moderate complexity    Rehab Potential Good    PT Frequency 1x / week    PT Duration Other (comment)   10 weeks   PT Treatment/Interventions ADLs/Self Care Home Management;Aquatic Therapy;Biofeedback;Gait training;Stair training;Functional mobility training;Therapeutic activities;Therapeutic exercise;Balance training;Neuromuscular re-education;Manual techniques;Patient/family education;Scar mobilization;Dry needling;Joint Manipulations   manual therapy with caution 2/2 pregnancy   PT Next Visit Plan FOTO. PT will review HEP and progress as indicated. Hip/LE strength training, manual therapy, seated on physioball (tilts/clocks)    PT Home Exercise Plan Medbridge: [email or web address]    Consulted and Agree with Plan of Care Patient           Patient will benefit from skilled therapeutic intervention in order to improve the following deficits and impairments:  Abnormal gait,Decreased endurance,Pain,Decreased balance,Improper body mechanics,Postural dysfunction,Decreased strength,Decreased mobility  Visit Diagnosis: Mixed incontinence  Other lack of coordination  Acute right-sided low back pain with sciatica, sciatica laterality unspecified     Problem List Patient Active Problem List   Diagnosis Date Noted  . History of preterm delivery, currently pregnant in second trimester 05/08/2020  . Chronic hypertension affecting pregnancy 05/08/2020  . History of preterm premature rupture of membranes (PROM) in previous pregnancy, currently pregnant in second trimester 05/08/2020  . Supervision of high risk pregnancy, antepartum 04/08/2020  . DDD (degenerative disc disease), lumbar  11/30/2019  . History of migraine headaches 11/30/2019  . Essential  hypertension 09/05/2019  . Sleep disturbance 09/05/2019  . Obesity 02/27/2019  . Vitamin D deficiency 11/02/2016    Talibah Colasurdo L 06/08/2020, 4:28 PM  [place] [facility] MAIN [facility] [address] [place], [place], [address] Phone: [phone]   Fax:  [phone]  Name: Sydney James MRN: [id number] Date of Birth: August 08, 1995  Zerita Boers, PT,DPT 06/08/20 4:28 PM Phone: [phone] Fax: [phone]

## 2020-06-08 NOTE — Patient Instructions (Signed)

## 2020-06-11 ENCOUNTER — Other Ambulatory Visit: Payer: Self-pay | Admitting: Certified Nurse Midwife

## 2020-06-11 DIAGNOSIS — Z3A21 21 weeks gestation of pregnancy: Secondary | ICD-10-CM

## 2020-06-12 ENCOUNTER — Other Ambulatory Visit: Payer: Self-pay

## 2020-06-15 ENCOUNTER — Ambulatory Visit

## 2020-06-15 ENCOUNTER — Other Ambulatory Visit: Payer: Self-pay

## 2020-06-19 ENCOUNTER — Other Ambulatory Visit: Payer: Self-pay | Admitting: Certified Nurse Midwife

## 2020-06-22 ENCOUNTER — Ambulatory Visit

## 2020-06-24 ENCOUNTER — Other Ambulatory Visit: Payer: Self-pay | Admitting: Certified Nurse Midwife

## 2020-06-24 DIAGNOSIS — O34219 Maternal care for unspecified type scar from previous cesarean delivery: Secondary | ICD-10-CM

## 2020-06-24 DIAGNOSIS — O10012 Pre-existing essential hypertension complicating pregnancy, second trimester: Secondary | ICD-10-CM

## 2020-06-29 ENCOUNTER — Ambulatory Visit

## 2020-07-02 ENCOUNTER — Ambulatory Visit: Attending: Obstetrics

## 2020-07-02 ENCOUNTER — Other Ambulatory Visit: Payer: Self-pay

## 2020-07-02 DIAGNOSIS — O10012 Pre-existing essential hypertension complicating pregnancy, second trimester: Secondary | ICD-10-CM | POA: Insufficient documentation

## 2020-07-02 DIAGNOSIS — Z3A24 24 weeks gestation of pregnancy: Secondary | ICD-10-CM | POA: Diagnosis not present

## 2020-07-02 DIAGNOSIS — O4292 Full-term premature rupture of membranes, unspecified as to length of time between rupture and onset of labor: Secondary | ICD-10-CM | POA: Insufficient documentation

## 2020-07-02 DIAGNOSIS — O34219 Maternal care for unspecified type scar from previous cesarean delivery: Secondary | ICD-10-CM | POA: Insufficient documentation

## 2020-07-03 ENCOUNTER — Encounter: Payer: Self-pay | Admitting: Certified Nurse Midwife

## 2020-07-08 ENCOUNTER — Other Ambulatory Visit: Payer: Self-pay | Admitting: Certified Nurse Midwife

## 2020-07-09 ENCOUNTER — Encounter: Payer: Self-pay | Admitting: Certified Nurse Midwife

## 2020-07-09 ENCOUNTER — Other Ambulatory Visit

## 2020-07-09 ENCOUNTER — Ambulatory Visit (INDEPENDENT_AMBULATORY_CARE_PROVIDER_SITE_OTHER): Admitting: Certified Nurse Midwife

## 2020-07-09 ENCOUNTER — Other Ambulatory Visit: Payer: Self-pay

## 2020-07-09 VITALS — BP 131/90 | HR 106 | Wt 223.3 lb

## 2020-07-09 DIAGNOSIS — O09899 Supervision of other high risk pregnancies, unspecified trimester: Secondary | ICD-10-CM

## 2020-07-09 DIAGNOSIS — O09292 Supervision of pregnancy with other poor reproductive or obstetric history, second trimester: Secondary | ICD-10-CM

## 2020-07-09 DIAGNOSIS — Z3A21 21 weeks gestation of pregnancy: Secondary | ICD-10-CM

## 2020-07-09 DIAGNOSIS — Z3A25 25 weeks gestation of pregnancy: Secondary | ICD-10-CM

## 2020-07-09 DIAGNOSIS — O09892 Supervision of other high risk pregnancies, second trimester: Secondary | ICD-10-CM

## 2020-07-09 DIAGNOSIS — Z98891 History of uterine scar from previous surgery: Secondary | ICD-10-CM

## 2020-07-09 DIAGNOSIS — O10919 Unspecified pre-existing hypertension complicating pregnancy, unspecified trimester: Secondary | ICD-10-CM

## 2020-07-09 LAB — POCT URINALYSIS DIPSTICK OB
Blood, UA: NEGATIVE
Glucose, UA: NEGATIVE
Leukocytes, UA: NEGATIVE
POC,PROTEIN,UA: NEGATIVE
Spec Grav, UA: 1.01 (ref 1.010–1.025)
pH, UA: 6.5 (ref 5.0–8.0)

## 2020-07-09 NOTE — Progress Notes (Signed)
[name]: She has been having Braxton Hick's contractions and spotty bleeding when she moves around a lot.

## 2020-07-09 NOTE — Patient Instructions (Signed)
Fetal Movement Counts Patient Name: ________________________________________________ Patient DueDate: ____________________ What is a fetal movement count?  A fetal movement count is the number of times that you feel your baby move during a certain amount of time. This may also be called a fetal kick count. A fetal movement count is recommended for every pregnant woman. You may be askedto start counting fetal movements as early as week 28 of your pregnancy. Pay attention to when your baby is most active. You may notice your baby's sleep and wake cycles. You may also notice things that make your baby move more. You should do a fetal movement count: When your baby is normally most active. At the same time each day. A good time to count movements is while you are resting, after having somethingto eat and drink. How do I count fetal movements? Find a quiet, comfortable area. Sit, or lie down on your side. Write down the date, the start time and stop time, and the number of movements that you felt between those two times. Take this information with you to your health care visits. Write down your start time when you feel the first movement. Count kicks, flutters, swishes, rolls, and jabs. You should feel at least 10 movements. You may stop counting after you have felt 10 movements, or if you have been counting for 2 hours. Write down the stop time. If you do not feel 10 movements in 2 hours, contact your health care provider for further instructions. Your health care provider may want to do additional tests to assess your baby's well-being. Contact a health care provider if: You feel fewer than 10 movements in 2 hours. Your baby is not moving like he or she usually does. Date: ____________ Start time: ____________ Stop time: ____________ Movements:____________ Date: ____________ Start time: ____________ Stop time: ____________ Movements:____________ Date: ____________ Start time: ____________ Stop  time: ____________ Movements:____________ Date: ____________ Start time: ____________ Stop time: ____________ Movements:____________ Date: ____________ Start time: ____________ Stop time: ____________ Movements:____________ Date: ____________ Start time: ____________ Stop time: ____________ Movements:____________ Date: ____________ Start time: ____________ Stop time: ____________ Movements:____________ Date: ____________ Start time: ____________ Stop time: ____________ Movements:____________ Date: ____________ Start time: ____________ Stop time: ____________ Movements:____________ This information is not intended to replace advice given to you by your health care provider. Make sure you discuss any questions you have with your healthcare provider. Document Revised: 08/09/2018 Document Reviewed: 08/09/2018 Elsevier Patient Education  2022 Elsevier Inc.  

## 2020-07-10 LAB — CBC WITH DIFFERENTIAL/PLATELET
Basophils Absolute: 0.1 10*3/uL (ref 0.0–0.2)
Basos: 1 %
EOS (ABSOLUTE): 0.3 10*3/uL (ref 0.0–0.4)
Eos: 2 %
Hematocrit: 37.8 % (ref 34.0–46.6)
Hemoglobin: 12.4 g/dL (ref 11.1–15.9)
Immature Grans (Abs): 0.1 10*3/uL (ref 0.0–0.1)
Immature Granulocytes: 1 %
Lymphocytes Absolute: 2.9 10*3/uL (ref 0.7–3.1)
Lymphs: 24 %
MCH: 29.5 pg (ref 26.6–33.0)
MCHC: 32.8 g/dL (ref 31.5–35.7)
MCV: 90 fL (ref 79–97)
Monocytes Absolute: 0.7 10*3/uL (ref 0.1–0.9)
Monocytes: 5 %
Neutrophils Absolute: 8.1 10*3/uL — ABNORMAL HIGH (ref 1.4–7.0)
Neutrophils: 67 %
Platelets: 258 10*3/uL (ref 150–450)
RBC: 4.21 x10E6/uL (ref 3.77–5.28)
RDW: 12.9 % (ref 11.7–15.4)
WBC: 12.1 10*3/uL — ABNORMAL HIGH (ref 3.4–10.8)

## 2020-07-10 LAB — COMPREHENSIVE METABOLIC PANEL
ALT: 16 IU/L (ref 0–32)
AST: 13 IU/L (ref 0–40)
Albumin/Globulin Ratio: 1.3 (ref 1.2–2.2)
Albumin: 3.7 g/dL — ABNORMAL LOW (ref 3.9–5.0)
Alkaline Phosphatase: 83 IU/L (ref 44–121)
BUN/Creatinine Ratio: 17 (ref 9–23)
BUN: 10 mg/dL (ref 6–20)
Bilirubin Total: <0.2 mg/dL (ref 0.0–1.2)
CO2: 18 mmol/L — ABNORMAL LOW (ref 20–29)
Calcium: 9.7 mg/dL (ref 8.7–10.2)
Chloride: 103 mmol/L (ref 96–106)
Creatinine, Ser: 0.59 mg/dL (ref 0.57–1.00)
Globulin, Total: 2.8 g/dL (ref 1.5–4.5)
Glucose: 92 mg/dL (ref 65–99)
Potassium: 4.2 mmol/L (ref 3.5–5.2)
Sodium: 138 mmol/L (ref 134–144)
Total Protein: 6.5 g/dL (ref 6.0–8.5)
eGFR: 127 mL/min/{1.73_m2} (ref >59)

## 2020-07-10 LAB — PROTEIN / CREATININE RATIO, URINE
Creatinine, Urine: 40.3 mg/dL
Protein, Ur: 19.3 mg/dL
Protein/Creat Ratio: 479 mg/g creat — ABNORMAL HIGH (ref 0–200)

## 2020-07-10 NOTE — Progress Notes (Signed)
[name]-Reports occasional BHCs, spotting after intercourse and breast leakage. Discussed home treatment measures for symptoms. Baseline labs completed today, see orders. Growth ultrasound scheduled with MFM, see chart. Anticipatory guidance regarding course of prenatal care. Reviewed red flag symptoms and when to call. RTC x 3-4 weeks for 28 week labs, TDaP, and [name] or sooner if needed.

## 2020-07-13 ENCOUNTER — Encounter

## 2020-07-14 ENCOUNTER — Other Ambulatory Visit: Payer: Self-pay | Admitting: Certified Nurse Midwife

## 2020-07-20 ENCOUNTER — Ambulatory Visit: Attending: Certified Nurse Midwife

## 2020-07-20 ENCOUNTER — Other Ambulatory Visit: Payer: Self-pay

## 2020-07-20 DIAGNOSIS — M5440 Lumbago with sciatica, unspecified side: Secondary | ICD-10-CM | POA: Diagnosis present

## 2020-07-20 DIAGNOSIS — N3946 Mixed incontinence: Secondary | ICD-10-CM | POA: Insufficient documentation

## 2020-07-20 DIAGNOSIS — R278 Other lack of coordination: Secondary | ICD-10-CM | POA: Insufficient documentation

## 2020-07-20 NOTE — Therapy (Signed)
[place] [facility] MAIN [facility] SERVICES [address] [place], [place], [address] Phone: [phone]   Fax:  [phone]  Physical Therapy Treatment  Patient Details  Name: Sydney James MRN: [id number] Date of Birth: 1995/04/06 Referring Provider (PT): Gunnar Bulla CNM   Encounter Date: 07/20/2020   PT End of Session - 07/20/20 1134     Visit Number 3    Number of Visits 10    Date for PT Re-Evaluation 08/31/20    Authorization Type Tricare    PT Start Time 0909   late start 2/2 system down this morning   PT Stop Time 1004    PT Time Calculation (min) 55 min    Activity Tolerance Patient tolerated treatment well;No increased pain    Behavior During Therapy WFL for tasks assessed/performed             Past Medical History:  Diagnosis Date   Lumbar herniated disc    Migraine    Preeclampsia    PVC's (premature ventricular contractions)    Raynaud's disease    Scoliosis    Back brace for 2 years   Scoliosis     Past Surgical History:  Procedure Laterality Date   CESAREAN SECTION N/A 12/22/2017   Procedure: CESAREAN SECTION;  Surgeon: Linzie Collin, MD;  Location: [facility] ORS;  Service: Obstetrics;  Laterality: N/A;  Female Born @ 45     DILATION AND CURETTAGE OF UTERUS     DILATION AND EVACUATION N/A 03/15/2016   Procedure: DILATATION AND EVACUATION;  Surgeon: Herold Harms, MD;  Location: [facility] ORS;  Service: Gynecology;  Laterality: N/A;   DILATION AND EVACUATION N/A 07/18/2019   Procedure: DILATATION AND EVACUATION;  Surgeon: Hildred Laser, MD;  Location: [facility] ORS;  Service: Gynecology;  Laterality: N/A;   WISDOM TOOTH EXTRACTION      There were no vitals filed for this visit.   Subjective Assessment - 07/20/20 0912     Subjective Pt reported ligament and back pain has really kicked up the last few weeks, so incr. Gabapentin to bid, 100mg . Pt missed last few appt's secondary to children sick and  husband's new work schedule. Pt reported BP has been fine. Pt reported HEP has been ok but back pain makes it challenging. Pt is wearing a support band when she goes to the grocery store. Pt reported L knee pain (anterior). Pt has been urinating approx. 1-2 times/hour. Pt reported she gets up approx. twice a night.    Pertinent History Hx of c-section, DDD (Lx spine), Vitamin D deficiency, hx of migraines    Patient Stated Goals Get the strength back, not having to pee every time I sneeze, decrease pain/pressure off hips    Currently in Pain? Yes    Pain Score 6     Pain Location Back    Pain Orientation Lower;Left    Pain Descriptors / Indicators Sharp    Pain Type Chronic pain    Pain Radiating Towards LLE (mostly) and sometimes RLE    Pain Onset More than a month ago    Pain Frequency Constant    Aggravating Factors  walking, squatting, sustained positions    Pain Relieving Factors changing positions                NMR: Access Code: URL: [email or web address] Date: 07/20/2020 Prepared by: 07/22/2020  Exercises Diaphragmatic Breathing at 90/90 Supported - 3 x daily - 7 x weekly - 1 sets - 5  reps Child's Pose with Thread the Needle - 1-2 x daily - 7 x weekly - 1 sets - 5 reps - 10 hold  Latissimus Dorsi Stretch at Wall - 1 x daily - 7 x weekly - 1 sets - 3 reps - 30 hold (review only) Supine Pelvic Floor Contraction - 1 x daily - 7 x weekly - 1 sets - 5-10 reps - 1-2 hold Clamshell - 1 x daily - 3 x weekly - 3 sets - 10 reps - 1-2 hold (pillow between knees) Sidelying Open Book Thoracic Lumbar Rotation and Extension - 1-2 x daily - 7 x weekly - 1 sets - 10 reps (lying on R side, L rotation only 2/2 L convex curve of Tx spine).  Pt required cues and demo for proper technique.    Patient Education (review only) Healthy Posture: How to Hold and Lift a Baby-pt states she has a harder time with carrying [age] year only 2/2 pregnant belly, but now  switches hips vs. Using only one side.  Lifting a PsychiatristCar Seat Lifting and Lowering Baby to a Changing Table Squat Lift with a D[employer] FoodBaby Household Activities Lifting Techniques Sleep Positions               [facility] Adult PT Treatment/Exercise - 07/20/20 1136       Manual Therapy   Manual Therapy Soft tissue mobilization;Joint mobilization    Manual therapy comments Pt reported reduced back pain to 5/10 after manual therapy.    Joint Mobilization PT applied PA sustained pressure to coccyx during 5 reps of pelvic floor contractions. L patella mobility assessment, with decr. mobility of lateral edge, lat to med. glides performed and sup to inf glides performed to L patella.    Soft tissue mobilization Pt lying on R side: PT performed massage and trigger point release to L glute med, L piriformis musculature.                  SELF CARE:  PT Education - 07/20/20 1132     Education Details PT reviewed, modified and added to HEP. PT reiterated the importance of wearing supportive shoes with heel strap. PT educated pt on proper toileting posture to relax pelvic floor and to fully empty bladder/bowels. PT educated pt on finishing this course of PT and then requesting a new referral 6 weeks postpartum, as pt asked about PT after c-section in 09/2020.    Person(s) Educated Patient    Methods Explanation;Demonstration;Verbal cues;Handout;Tactile cues    Comprehension Verbalized understanding;Returned demonstration              PT Short Term Goals - 07/20/20 1141       PT SHORT TERM GOAL #1   Title Pt will be IND in HEP to improve body mechanics, posture and strength. TARGET DATE FOR ALL STGS: 07/07/20-original, updated to: 08/10/20    Baseline No HEP    Time 5    Period Weeks    Status New      PT SHORT TERM GOAL #2   Title Pt will be able to amb. for 10 minutes without incr. in back/hip pain in order to care for other children.    Baseline 0 minutes    Time 5    Period Weeks     Status New      PT SHORT TERM GOAL #3   Title Have pt complete FOTO.    Time 5    Period Weeks    Status New  PT Long Term Goals - 07/20/20 1141       PT LONG TERM GOAL #1   Title Pt will be IND in progressed HEP to improve posture, pain, body mechanics. TARGET DATE FOR ALL LTGS: 08/11/20-original, now: 08/24/20 (before PT out and c-section)    Baseline No HEP    Time 10    Period Weeks    Status New      PT LONG TERM GOAL #2   Title Pt will amb. for 20 minutes without incr. in back/hip pain in order to perform ADLs and care for children.    Baseline 0 minutes    Time 10    Period Weeks    Status New      PT LONG TERM GOAL #3   Title Pt will report 1 change of underwear/week 2/2 incontinence.    Baseline 2-3/week change of underwear 2/2 incontinence.    Time 10    Period Weeks    Status New      PT LONG TERM GOAL #4   Title Pt will report no pressure during internal OB exam and intercourse in order to improve QOL and reduce pain.    Time 10    Period Weeks    Status New                   Plan - 07/20/20 1138     Clinical Impression Statement Today's skilled session focused on reviewing, modifying and adding to HEP, as pt missed last 5 weeks of PT 2/2 her children being sick and husband's new schedule. Pt demonstrated progress, as she reported reduced pain at end of session. Pt demonstrated progress, as she was able to perform pelvic floor contraction in seated position without compensatory strategies. Pt continues to experience LBP and SUI and would continue to benefit from skilled PT to improve leakage, strength, and posture during all ADLs. All goals will be pushed back 2/2 pt missing 5 weeks of PT.    Personal Factors and Comorbidities Comorbidity 3+;Fitness;Past/Current Experience;Time since onset of injury/illness/exacerbation;Profession    Comorbidities Hx of c-section, DDD (Lx spine), Vitamin D deficiency, hx of migraines, hx of HTN  during pregnancy    Examination-Activity Limitations Bed Mobility;Bend;Caring for Others;Carry;Continence;Dressing;Lift;Toileting;Stand;Stairs;Squat;Sleep;Sit;Locomotion Level;Transfers    Examination-Participation Restrictions Meal Prep;Cleaning;Church;Interpersonal Relationship;Laundry    Stability/Clinical Decision Making Evolving/Moderate complexity    Rehab Potential Good    PT Frequency 1x / week    PT Duration Other (comment)   10 weeks   PT Treatment/Interventions ADLs/Self Care Home Management;Aquatic Therapy;Biofeedback;Gait training;Stair training;Functional mobility training;Therapeutic activities;Therapeutic exercise;Balance training;Neuromuscular re-education;Manual techniques;Patient/family education;Scar mobilization;Dry needling;Joint Manipulations   manual therapy with caution 2/2 pregnancy   PT Next Visit Plan FOTO. PT will review HEP and progress as indicated. Hip/LE strength training, assess feet and gait, manual therapy, seated on physioball (tilts/clocks)    PT Home Exercise Plan Medbridge: [email or web address]    Consulted and Agree with Plan of Care Patient             Patient will benefit from skilled therapeutic intervention in order to improve the following deficits and impairments:  Abnormal gait, Decreased endurance, Pain, Decreased balance, Improper body mechanics, Postural dysfunction, Decreased strength, Decreased mobility  Visit Diagnosis: Mixed incontinence  Other lack of coordination  Acute right-sided low back pain with sciatica, sciatica laterality unspecified     Problem List Patient Active Problem List   Diagnosis Date Noted   History of preterm delivery, currently pregnant in second trimester 05/08/2020  Chronic hypertension affecting pregnancy 05/08/2020   History of preterm premature rupture of membranes (PROM) in previous pregnancy, currently pregnant in second trimester 05/08/2020   Supervision of high risk pregnancy, antepartum 04/08/2020   DDD  (degenerative disc disease), lumbar 11/30/2019   History of migraine headaches 11/30/2019   Essential hypertension 09/05/2019   Sleep disturbance 09/05/2019   Obesity 02/27/2019   Vitamin D deficiency 11/02/2016    Tage Feggins L 07/20/2020, 11:46 AM  [place] [facility] MAIN [facility] SERVICES [address] [place], [place], [address] Phone: [phone]   Fax:  [phone]  Name: Journey Castonguay MRN: [id number] Date of Birth: 09-04-1995   Zerita Boers, PT,DPT 07/20/20 11:48 AM Phone: [phone] Fax: [phone]

## 2020-07-20 NOTE — Patient Instructions (Addendum)
NMR: Access Code: [email or web address] URL: [email or web address] Date: 06/08/2020 Prepared by: Zerita Boers   Exercises:  Diaphragmatic Breathing at 90/90 Supported - 3 x daily - 7 x weekly - 1 sets - 5 reps Child's Pose with Thread the Needle - 1-2 x daily - 7 x weekly - 1 sets - 5 reps - 10 hold Latissimus Dorsi Stretch at Wall - 1 x daily - 7 x weekly - 1 sets - 3 reps - 30 hold Supine Pelvic Floor Contraction - 1 x daily - 7 x weekly - 1 sets - 5-10 reps - 1-2 hold

## 2020-07-27 ENCOUNTER — Ambulatory Visit

## 2020-07-29 ENCOUNTER — Other Ambulatory Visit: Payer: Self-pay

## 2020-07-29 ENCOUNTER — Ambulatory Visit

## 2020-07-29 DIAGNOSIS — R278 Other lack of coordination: Secondary | ICD-10-CM

## 2020-07-29 DIAGNOSIS — M5440 Lumbago with sciatica, unspecified side: Secondary | ICD-10-CM

## 2020-07-29 DIAGNOSIS — N3946 Mixed incontinence: Secondary | ICD-10-CM

## 2020-07-29 NOTE — Therapy (Addendum)
[facility] MAIN [facility] SERVICES [address] [place], [place], [address] Phone: [phone]   Fax:  [phone]  Physical Therapy Evaluation  Patient Details  Name: Sydney James MRN: [id number] Date of Birth: [birth year] Referring Provider (PT): Gunnar Bulla CNM   Encounter Date: 07/29/2020   PT End of Session - 07/29/20 0858     Visit Number 4    Number of Visits 10    Date for PT Re-Evaluation 08/31/20    Authorization Type [employer]    PT Start Time 0803    PT Stop Time 0857    PT Time Calculation (min) 54 min    Activity Tolerance Patient tolerated treatment well;No increased pain    Behavior During Therapy WFL for tasks assessed/performed             Past Medical History:  Diagnosis Date   Lumbar herniated disc    Migraine    Preeclampsia    PVC's (premature ventricular contractions)    Raynaud's disease    Scoliosis    Back brace for 2 years   Scoliosis     Past Surgical History:  Procedure Laterality Date   CESAREAN SECTION N/A 12/22/2017   Procedure: CESAREAN SECTION;  Surgeon: Linzie Collin, MD;  Location: [facility] ORS;  Service: Obstetrics;  Laterality: N/A;  Female Born @ 64     DILATION AND CURETTAGE OF UTERUS     DILATION AND EVACUATION N/A 03/15/2016   Procedure: DILATATION AND EVACUATION;  Surgeon: Herold Harms, MD;  Location: [facility] ORS;  Service: Gynecology;  Laterality: N/A;   DILATION AND EVACUATION N/A 07/18/2019   Procedure: DILATATION AND EVACUATION;  Surgeon: Hildred Laser, MD;  Location: [facility] ORS;  Service: Gynecology;  Laterality: N/A;   WISDOM TOOTH EXTRACTION      There were no vitals filed for this visit.    Subjective Assessment - 07/29/20 0805     Subjective Pt reported back pain is getting progressively worse but pelvic floor is getting better. She's able to control SUI better, intercourse is not as comfortable, does not feel like "everything is going to fall out" and  is only getting up once to go to the bathroom. Pt reports her stools are mostly Types 4 and 5 (70% of the time). Pt reported L knee has been feeling better.    Pertinent History Hx of c-section, DDD (Lx spine), Vitamin D deficiency, hx of migraines    Patient Stated Goals Get the strength back, not having to pee every time I sneeze, decrease pain/pressure off hips    Currently in Pain? Yes    Pain Score 7     Pain Location Back    Pain Orientation Left;Lower;Right   L side is still more painful   Pain Descriptors / Indicators Aching;Sharp    Pain Type Chronic pain    Pain Radiating Towards LLE and sometimes RLE    Pain Onset More than a month ago    Pain Frequency Constant    Aggravating Factors  walking, squatting, sustained positions    Pain Relieving Factors changing positions                 NMR: Access Code: [email or web address] URL: [email or web address] Date: 07/29/2020 Prepared by: Zerita Boers  Exercises Diaphragmatic Breathing at 90/90 Supported - 3 x daily - 7 x weekly - 1 sets - 5 reps Child's Pose with Thread the Needle - 1-2 x daily - 7 x weekly - 1 sets -  5 reps - 10 hold Latissimus Dorsi Stretch at Wall - 1 x daily - 7 x weekly - 1 sets - 3 reps - 30 hold Supine Pelvic Floor Contraction - 1 x daily - 7 x weekly - 1 sets - 5-10 reps - 1-2 hold (performed in sidelying and seated) Clamshell - 1 x daily - 3 x weekly - 3 sets - 10 reps - 1-2 hold Sidelying Open Book Thoracic Lumbar Rotation and Extension - 1-2 x daily - 7 x weekly - 1 sets - 10 reps (performed with PT applying PA pressure to Tx spine) Seated Table Piriformis Stretch - 1 x daily - 7 x weekly - 1 sets - 3 reps - 30-45 hold  Pelvic tilts in seated and standing (ant/post) x10 reps each position.  Cues and demo for proper technique and performed with S for safety.         Objective measurements completed on examination: See above findings.       [facility] Adult PT Treatment/Exercise -  07/29/20 0859       Manual Therapy   Manual Therapy Soft tissue mobilization;Joint mobilization    Manual therapy comments Pt reported no change in back pain but manual therapy was tolerable.    Joint Mobilization PT applied PA sustained pressure to coccyx during 5 reps of pelvic floor contractions. Pt performed grade 2 to 3 PAs to L sacrum to incr. Mobility with and without PT moving L hip in flex/ext. PT applied 3 lines of kinesiotape at sacrum after mobs to provide propioception and to maintain gains made during session.   Soft tissue mobilization Pt lying on R side: PT performed massage over glute med and piriformis on L side.                    PT Education - 07/29/20 0857     Education Details PT reviewed and progress HEP as tolerated and indicated. PT educated pt on taping of sacrum to improve posture/mobility.    Person(s) Educated Patient    Methods Explanation;Demonstration;Handout;Verbal cues    Comprehension Verbalized understanding              PT Short Term Goals - 07/20/20 1141       PT SHORT TERM GOAL #1   Title Pt will be IND in HEP to improve body mechanics, posture and strength. TARGET DATE FOR ALL STGS: 07/07/20-original, updated to: 08/10/20    Baseline No HEP    Time 5    Period Weeks    Status New      PT SHORT TERM GOAL #2   Title Pt will be able to amb. for 10 minutes without incr. in back/hip pain in order to care for other children.    Baseline 0 minutes    Time 5    Period Weeks    Status New      PT SHORT TERM GOAL #3   Title Have pt complete FOTO.    Time 5    Period Weeks    Status New               PT Long Term Goals - 07/20/20 1141       PT LONG TERM GOAL #1   Title Pt will be IND in progressed HEP to improve posture, pain, body mechanics. TARGET DATE FOR ALL LTGS: 08/11/20-original, now: 08/24/20 (before PT out and c-section)    Baseline No HEP    Time 10  Period Weeks    Status New      PT LONG TERM GOAL #2    Title Pt will amb. for 20 minutes without incr. in back/hip pain in order to perform ADLs and care for children.    Baseline 0 minutes    Time 10    Period Weeks    Status New      PT LONG TERM GOAL #3   Title Pt will report 1 change of underwear/week 2/2 incontinence.    Baseline 2-3/week change of underwear 2/2 incontinence.    Time 10    Period Weeks    Status New      PT LONG TERM GOAL #4   Title Pt will report no pressure during internal OB exam and intercourse in order to improve QOL and reduce pain.    Time 10    Period Weeks    Status New                    Plan - 07/29/20 1253     Clinical Impression Statement Today's skilled session focused on addressing pt's incr. low back pain as pt demonstrating progress with incontinence. Pt demonstrated progress as she was able to progress pelvic floor contraction to seated position vs. semirecumbent and reported incr. pain relief during B piriformis stretch in modified position. Pt continues to experience intermittent leakage and continued low back pain, therfore, pt would continue to benefit from skilled PT to improve deficits during all ADLs.    Personal Factors and Comorbidities Comorbidity 3+;Fitness;Past/Current Experience;Time since onset of injury/illness/exacerbation;Profession    Comorbidities Hx of c-section, DDD (Lx spine), Vitamin D deficiency, hx of migraines, hx of HTN during pregnancy    Examination-Activity Limitations Bed Mobility;Bend;Caring for Others;Carry;Continence;Dressing;Lift;Toileting;Stand;Stairs;Squat;Sleep;Sit;Locomotion Level;Transfers    Examination-Participation Restrictions Meal Prep;Cleaning;Church;Interpersonal Relationship;Laundry    Stability/Clinical Decision Making Evolving/Moderate complexity    Rehab Potential Good    PT Frequency 1x / week    PT Duration Other (comment)   10 weeks   PT Treatment/Interventions ADLs/Self Care Home Management;Aquatic Therapy;Biofeedback;Gait  training;Stair training;Functional mobility training;Therapeutic activities;Therapeutic exercise;Balance training;Neuromuscular re-education;Manual techniques;Patient/family education;Scar mobilization;Dry needling;Joint Manipulations   manual therapy with caution 2/2 pregnancy   PT Next Visit Plan Check L knee-MT as needed. Hip/LE strength training, assess feet and gait, manual therapy, seated on physioball (tilts/clocks)    PT Home Exercise Plan Medbridge: [email or web address]    Consulted and Agree with Plan of Care Patient             Patient will benefit from skilled therapeutic intervention in order to improve the following deficits and impairments:  Abnormal gait, Decreased endurance, Pain, Decreased balance, Improper body mechanics, Postural dysfunction, Decreased strength, Decreased mobility  Visit Diagnosis: Mixed incontinence  Other lack of coordination  Acute right-sided low back pain with sciatica, sciatica laterality unspecified     Problem List Patient Active Problem List   Diagnosis Date Noted   History of preterm delivery, currently pregnant in second trimester 05/08/2020   Chronic hypertension affecting pregnancy 05/08/2020   History of preterm premature rupture of membranes (PROM) in previous pregnancy, currently pregnant in second trimester 05/08/2020   Supervision of high risk pregnancy, antepartum 04/08/2020   DDD (degenerative disc disease), lumbar 11/30/2019   History of migraine headaches 11/30/2019   Essential hypertension 09/05/2019   Sleep disturbance 09/05/2019   Obesity 02/27/2019   Vitamin D deficiency 11/02/2016    Macauley Mossberg L 07/29/2020, 2:37 PM  [facility] MAIN REHAB SERVICES  [address] [place], [place], [address] Phone: [phone]   Fax:  [phone]  Name: Joella PrinceMichaela Laday MRN: [id number] Date of Birth: [birth year]  Zerita BoersJennifer Breyson Kelm, PT,DPT 07/29/20 2:37 PM Phone: [phone] Fax:  [phone]

## 2020-07-31 ENCOUNTER — Other Ambulatory Visit: Payer: Self-pay | Admitting: Certified Nurse Midwife

## 2020-08-03 ENCOUNTER — Ambulatory Visit

## 2020-08-05 ENCOUNTER — Ambulatory Visit (INDEPENDENT_AMBULATORY_CARE_PROVIDER_SITE_OTHER): Admitting: Certified Nurse Midwife

## 2020-08-05 ENCOUNTER — Other Ambulatory Visit

## 2020-08-05 ENCOUNTER — Other Ambulatory Visit: Payer: Self-pay

## 2020-08-05 VITALS — BP 137/84 | HR 89 | Wt 227.4 lb

## 2020-08-05 DIAGNOSIS — O10919 Unspecified pre-existing hypertension complicating pregnancy, unspecified trimester: Secondary | ICD-10-CM

## 2020-08-05 DIAGNOSIS — Z23 Encounter for immunization: Secondary | ICD-10-CM

## 2020-08-05 DIAGNOSIS — Z3483 Encounter for supervision of other normal pregnancy, third trimester: Secondary | ICD-10-CM | POA: Diagnosis not present

## 2020-08-05 DIAGNOSIS — Z3A29 29 weeks gestation of pregnancy: Secondary | ICD-10-CM

## 2020-08-05 LAB — POCT URINALYSIS DIPSTICK OB
Bilirubin, UA: NEGATIVE
Blood, UA: NEGATIVE
Glucose, UA: NEGATIVE
Ketones, UA: NEGATIVE
Leukocytes, UA: NEGATIVE
Nitrite, UA: NEGATIVE
POC,PROTEIN,UA: NEGATIVE
Spec Grav, UA: 1.01 (ref 1.010–1.025)
Urobilinogen, UA: 0.2 E.U./dL
pH, UA: 7 (ref 5.0–8.0)

## 2020-08-05 MED ORDER — TETANUS-DIPHTH-ACELL PERTUSSIS 5-2.5-18.5 LF-MCG/0.5 IM SUSY
0.5000 mL | PREFILLED_SYRINGE | Freq: Once | INTRAMUSCULAR | Status: AC
  Administered 2020-08-05: 0.5 mL via INTRAMUSCULAR

## 2020-08-05 NOTE — Patient Instructions (Signed)
Oral Glucose Tolerance Test During Pregnancy °Why am I having this test? °The oral glucose tolerance test (OGTT) is done to check how your body processes blood sugar (glucose). This is one of several tests used to diagnose diabetes that develops during pregnancy (gestational diabetes mellitus). Gestational diabetes is a short-term form of diabetes that some women develop while they are pregnant. It usually occurs during the second trimester of pregnancy and goes away after delivery. °Testing, or screening, for gestational diabetes usually occurs at weeks 24-28 of pregnancy. You may have the OGTT test after having a 1-hour glucose screening test if the results from that test indicate that you may have gestational diabetes. This test may also be needed if: °You have a history of gestational diabetes. °There is a history of giving birth to very large babies or of losing pregnancies (having stillbirths). °You have signs and symptoms of diabetes, such as: °Changes in your eyesight. °Tingling or numbness in your hands or feet. °Changes in hunger, thirst, and urination, and these are not explained by your pregnancy. °What is being tested? °This test measures the amount of glucose in your blood at different times during a period of 3 hours. This shows how well your body can process glucose. °What kind of sample is taken? °Blood samples are required for this test. They are usually collected by inserting a needle into a blood vessel. °How do I prepare for this test? °For 3 days before your test, eat normally. Have plenty of carbohydrate-rich foods. °Follow instructions from your health care provider about: °Eating or drinking restrictions on the day of the test. You may be asked not to eat or drink anything other than water (to fast) starting 8-10 hours before the test. °Changing or stopping your regular medicines. Some medicines may interfere with this test. °Tell a health care provider about: °All medicines you are taking,  including vitamins, herbs, eye drops, creams, and over-the-counter medicines. °Any blood disorders you have. °Any surgeries you have had. °Any medical conditions you have. °What happens during the test? °First, your blood glucose will be measured. This is referred to as your fasting blood glucose because you fasted before the test. Then, you will drink a glucose solution that contains a certain amount of glucose. Your blood glucose will be measured again 1, 2, and 3 hours after you drink the solution. °This test takes about 3 hours to complete. You will need to stay at the testing location during this time. During the testing period: °Do not eat or drink anything other than the glucose solution. °Do not exercise. °Do not use any products that contain nicotine or tobacco, such as cigarettes, e-cigarettes, and chewing tobacco. These can affect your test results. If you need help quitting, ask your health care provider. °The testing procedure may vary among health care providers and hospitals. °How are the results reported? °Your results will be reported as milligrams of glucose per deciliter of blood (mg/dL) or millimoles per liter (mmol/L). There is more than one source for screening and diagnosis reference values used to diagnose gestational diabetes. Your health care provider will compare your results to normal values that were established after testing a large group of people (reference values). Reference values may vary among labs and hospitals. For this test (Carpenter-Coustan), reference values are: °Fasting: 95 mg/dL (5.3 mmol/L). °1 hour: 180 mg/dL (10.0 mmol/L). °2 hour: 155 mg/dL (8.6 mmol/L). °3 hour: 140 mg/dL (7.8 mmol/L). °What do the results mean? °Results below the reference values are considered   normal. If two or more of your blood glucose levels are at or above the reference values, you may be diagnosed with gestational diabetes. If only one level is high, your health care provider may suggest  repeat testing or other tests to confirm a diagnosis. °Talk with your health care provider about what your results mean. °Questions to ask your health care provider °Ask your health care provider, or the department that is doing the test: °When will my results be ready? °How will I get my results? °What are my treatment options? °What other tests do I need? °What are my next steps? °Summary °The oral glucose tolerance test (OGTT) is one of several tests used to diagnose diabetes that develops during pregnancy (gestational diabetes mellitus). Gestational diabetes is a short-term form of diabetes that some women develop while they are pregnant. °You may have the OGTT test after having a 1-hour glucose screening test if the results from that test show that you may have gestational diabetes. You may also have this test if you have any symptoms or risk factors for this type of diabetes. °Talk with your health care provider about what your results mean. °This information is not intended to replace advice given to you by your health care provider. Make sure you discuss any questions you have with your health care provider. °Document Revised: 05/30/2019 Document Reviewed: 05/30/2019 °Elsevier Patient Education © 2022 Elsevier Inc. ° °

## 2020-08-05 NOTE — Progress Notes (Signed)
[name] doing well. Feels good movement. 28 wk labs today: Glucose screen/RPR/CBC. Tdap done. Blood transfusion consent completed, all questions answered. Ready set baby reviewed, see check list for topics covered. Sample birth plan given, will follow up in upcoming visits. Discussed birth control after delivery, information pamphlet given. Pt is planning a tubal if she has a c section, other wise her partner will have a vasectomy. She has u/s for growth with MFM on 8/9.   Follow up 2 wk OB or sooner if needed.    Doreene Burke, CNM

## 2020-08-07 ENCOUNTER — Other Ambulatory Visit: Payer: Self-pay | Admitting: Obstetrics

## 2020-08-07 ENCOUNTER — Telehealth: Payer: Self-pay | Admitting: Certified Nurse Midwife

## 2020-08-07 DIAGNOSIS — Z3A30 30 weeks gestation of pregnancy: Secondary | ICD-10-CM

## 2020-08-07 DIAGNOSIS — O10919 Unspecified pre-existing hypertension complicating pregnancy, unspecified trimester: Secondary | ICD-10-CM

## 2020-08-07 DIAGNOSIS — O09899 Supervision of other high risk pregnancies, unspecified trimester: Secondary | ICD-10-CM

## 2020-08-07 DIAGNOSIS — O0993 Supervision of high risk pregnancy, unspecified, third trimester: Secondary | ICD-10-CM

## 2020-08-07 DIAGNOSIS — O09293 Supervision of pregnancy with other poor reproductive or obstetric history, third trimester: Secondary | ICD-10-CM

## 2020-08-07 DIAGNOSIS — O99213 Obesity complicating pregnancy, third trimester: Secondary | ICD-10-CM

## 2020-08-07 DIAGNOSIS — O34219 Maternal care for unspecified type scar from previous cesarean delivery: Secondary | ICD-10-CM

## 2020-08-07 LAB — CBC
Hematocrit: 33.4 % — ABNORMAL LOW (ref 34.0–46.6)
Hemoglobin: 11.1 g/dL (ref 11.1–15.9)
MCH: 29.5 pg (ref 26.6–33.0)
MCHC: 33.2 g/dL (ref 31.5–35.7)
MCV: 89 fL (ref 79–97)
Platelets: 236 10*3/uL (ref 150–450)
RBC: 3.76 x10E6/uL — ABNORMAL LOW (ref 3.77–5.28)
RDW: 13.1 % (ref 11.7–15.4)
WBC: 9.4 10*3/uL (ref 3.4–10.8)

## 2020-08-07 LAB — GLUCOSE, 1 HOUR GESTATIONAL: Gestational Diabetes Screen: 137 mg/dL (ref 65–139)

## 2020-08-07 LAB — RPR, QUANT+TP ABS (REFLEX)
Rapid Plasma Reagin, Quant: 1:1 {titer} — ABNORMAL HIGH
T Pallidum Abs: NONREACTIVE

## 2020-08-07 LAB — RPR: RPR Ser Ql: REACTIVE — AB

## 2020-08-07 NOTE — Telephone Encounter (Signed)
Sydney James called in and stated her mychart is not working and she was unable to see her glucose results.  Sydney James would like a phone call to let her know her results.

## 2020-08-10 ENCOUNTER — Ambulatory Visit

## 2020-08-11 ENCOUNTER — Ambulatory Visit: Attending: Obstetrics and Gynecology

## 2020-08-11 ENCOUNTER — Other Ambulatory Visit: Payer: Self-pay

## 2020-08-11 VITALS — BP 131/81 | HR 91 | Temp 98.0°F | Resp 18 | Ht 67.0 in | Wt 227.5 lb

## 2020-08-11 DIAGNOSIS — E669 Obesity, unspecified: Secondary | ICD-10-CM | POA: Insufficient documentation

## 2020-08-11 DIAGNOSIS — O99213 Obesity complicating pregnancy, third trimester: Secondary | ICD-10-CM | POA: Insufficient documentation

## 2020-08-11 DIAGNOSIS — O10919 Unspecified pre-existing hypertension complicating pregnancy, unspecified trimester: Secondary | ICD-10-CM

## 2020-08-11 DIAGNOSIS — O0993 Supervision of high risk pregnancy, unspecified, third trimester: Secondary | ICD-10-CM | POA: Diagnosis not present

## 2020-08-11 DIAGNOSIS — Z3A30 30 weeks gestation of pregnancy: Secondary | ICD-10-CM | POA: Diagnosis not present

## 2020-08-11 DIAGNOSIS — O10913 Unspecified pre-existing hypertension complicating pregnancy, third trimester: Secondary | ICD-10-CM | POA: Diagnosis not present

## 2020-08-11 DIAGNOSIS — O09293 Supervision of pregnancy with other poor reproductive or obstetric history, third trimester: Secondary | ICD-10-CM | POA: Insufficient documentation

## 2020-08-11 DIAGNOSIS — O34219 Maternal care for unspecified type scar from previous cesarean delivery: Secondary | ICD-10-CM

## 2020-08-11 DIAGNOSIS — O09899 Supervision of other high risk pregnancies, unspecified trimester: Secondary | ICD-10-CM

## 2020-08-14 ENCOUNTER — Other Ambulatory Visit: Payer: Self-pay | Admitting: Certified Nurse Midwife

## 2020-08-17 ENCOUNTER — Ambulatory Visit: Attending: Certified Nurse Midwife

## 2020-08-21 ENCOUNTER — Ambulatory Visit (INDEPENDENT_AMBULATORY_CARE_PROVIDER_SITE_OTHER): Admitting: Certified Nurse Midwife

## 2020-08-21 ENCOUNTER — Other Ambulatory Visit: Payer: Self-pay

## 2020-08-21 VITALS — BP 129/84 | HR 120 | Wt 226.7 lb

## 2020-08-21 DIAGNOSIS — Z3A31 31 weeks gestation of pregnancy: Secondary | ICD-10-CM

## 2020-08-21 DIAGNOSIS — Z3483 Encounter for supervision of other normal pregnancy, third trimester: Secondary | ICD-10-CM

## 2020-08-21 LAB — CYTOLOGY - PAP: Diagnosis: NEGATIVE

## 2020-08-21 LAB — POCT URINALYSIS DIPSTICK OB
Bilirubin, UA: NEGATIVE
Blood, UA: NEGATIVE
Glucose, UA: NEGATIVE
Ketones, UA: NEGATIVE
Leukocytes, UA: NEGATIVE
Nitrite, UA: NEGATIVE
POC,PROTEIN,UA: NEGATIVE
Spec Grav, UA: 1.01 (ref 1.010–1.025)
Urobilinogen, UA: 0.2 E.U./dL
pH, UA: 6.5 (ref 5.0–8.0)

## 2020-08-21 NOTE — Progress Notes (Signed)
[name] doing well. Feeling good fetal movement. Has continued to have some nausea that is the worst in the morning. It subsides through the day. She is taking the Zofran which helps. She denies vomiting. She has MFM u/s ordered for growth on 9/6. Discussed seeing MDs next visit due to being on vacation. She will follow up with them in 2 wk, then with me in 4 wks at which time we will start NSTs wkly. She verbalize and agrees to plan of care.   Doreene Burke, CNM

## 2020-08-21 NOTE — Patient Instructions (Signed)
[place] [facility]  [facility]  [address], [place], [place] [address]  Phone: [phone]  [facility] (second location)  [address] [place], [place] [address]  Phone: [phone]  [facility] ([place]) [address], [place], [place] [address] Phone: [phone]  [facility]  [address] [place], [place] [address]  Phone: [phone] 

## 2020-08-24 ENCOUNTER — Ambulatory Visit

## 2020-08-24 NOTE — Therapy (Signed)
[place] MAIN [facility] SERVICES [address] [place], [place], [address] Phone: [phone]   Fax:  [phone]  Patient Details  Name: Sydney James MRN: [id number] Date of Birth: 1995-08-06 Referring Provider:  No ref. provider found  Encounter Date: 08/24/2020  PHYSICAL THERAPY DISCHARGE SUMMARY  Visits from Start of Care: 4  Current functional level related to goals / functional outcomes:  PT Short Term Goals - 07/20/20 1141       PT SHORT TERM GOAL #1   Title Pt will be IND in HEP to improve body mechanics, posture and strength. TARGET DATE FOR ALL STGS: 07/07/20-original, updated to: 08/10/20    Baseline No HEP    Time 5    Period Weeks    Status New      PT SHORT TERM GOAL #2   Title Pt will be able to amb. for 10 minutes without incr. in back/hip pain in order to care for other children.    Baseline 0 minutes    Time 5    Period Weeks    Status New      PT SHORT TERM GOAL #3   Title Have pt complete FOTO.    Time 5    Period Weeks    Status New              PT Long Term Goals - 07/20/20 1141       PT LONG TERM GOAL #1   Title Pt will be IND in progressed HEP to improve posture, pain, body mechanics. TARGET DATE FOR ALL LTGS: 08/11/20-original, now: 08/24/20 (before PT out and c-section)    Baseline No HEP    Time 10    Period Weeks    Status New      PT LONG TERM GOAL #2   Title Pt will amb. for 20 minutes without incr. in back/hip pain in order to perform ADLs and care for children.    Baseline 0 minutes    Time 10    Period Weeks    Status New      PT LONG TERM GOAL #3   Title Pt will report 1 change of underwear/week 2/2 incontinence.    Baseline 2-3/week change of underwear 2/2 incontinence.    Time 10    Period Weeks    Status New      PT LONG TERM GOAL #4   Title Pt will report no pressure during internal OB exam and intercourse in order to improve QOL and reduce pain.    Time 10    Period Weeks     Status New               Remaining deficits: Ongoing, as pt's schedule and illness prevented pt from continuing at this time. Pt requesting new POC once she is 6 weeks postpartum. PT educated pt on requesting new referral to OPPT pelvic health at 6 weeks postpartum.    Education / Equipment: HEP   Patient agrees to discharge. Patient goals were not met. Patient is being discharged due to  see above.   Eliseo Withers L 08/24/2020, 9:37 AM  Geoffry Paradise, PT,DPT 08/24/20 9:38 AM Phone: [phone] Fax: [place] MAIN [facility] SERVICES [address] [place], [place], [address] Phone: [phone]   Fax:  [phone]

## 2020-09-02 ENCOUNTER — Other Ambulatory Visit: Payer: Self-pay | Admitting: Obstetrics and Gynecology

## 2020-09-02 DIAGNOSIS — O09293 Supervision of pregnancy with other poor reproductive or obstetric history, third trimester: Secondary | ICD-10-CM

## 2020-09-02 DIAGNOSIS — O34219 Maternal care for unspecified type scar from previous cesarean delivery: Secondary | ICD-10-CM

## 2020-09-02 DIAGNOSIS — O10919 Unspecified pre-existing hypertension complicating pregnancy, unspecified trimester: Secondary | ICD-10-CM

## 2020-09-02 DIAGNOSIS — O09899 Supervision of other high risk pregnancies, unspecified trimester: Secondary | ICD-10-CM

## 2020-09-03 ENCOUNTER — Other Ambulatory Visit: Payer: Self-pay | Admitting: Obstetrics and Gynecology

## 2020-09-08 ENCOUNTER — Other Ambulatory Visit: Payer: Self-pay

## 2020-09-08 ENCOUNTER — Ambulatory Visit: Attending: Maternal & Fetal Medicine

## 2020-09-08 DIAGNOSIS — O10013 Pre-existing essential hypertension complicating pregnancy, third trimester: Secondary | ICD-10-CM | POA: Insufficient documentation

## 2020-09-08 DIAGNOSIS — O09899 Supervision of other high risk pregnancies, unspecified trimester: Secondary | ICD-10-CM

## 2020-09-08 DIAGNOSIS — O99213 Obesity complicating pregnancy, third trimester: Secondary | ICD-10-CM | POA: Insufficient documentation

## 2020-09-08 DIAGNOSIS — O09213 Supervision of pregnancy with history of pre-term labor, third trimester: Secondary | ICD-10-CM | POA: Diagnosis not present

## 2020-09-08 DIAGNOSIS — Z3A34 34 weeks gestation of pregnancy: Secondary | ICD-10-CM | POA: Diagnosis not present

## 2020-09-08 DIAGNOSIS — O09293 Supervision of pregnancy with other poor reproductive or obstetric history, third trimester: Secondary | ICD-10-CM | POA: Insufficient documentation

## 2020-09-08 DIAGNOSIS — O10919 Unspecified pre-existing hypertension complicating pregnancy, unspecified trimester: Secondary | ICD-10-CM

## 2020-09-08 DIAGNOSIS — O34219 Maternal care for unspecified type scar from previous cesarean delivery: Secondary | ICD-10-CM | POA: Diagnosis not present

## 2020-09-08 DIAGNOSIS — O09893 Supervision of other high risk pregnancies, third trimester: Secondary | ICD-10-CM | POA: Diagnosis not present

## 2020-09-09 ENCOUNTER — Other Ambulatory Visit: Payer: Self-pay | Admitting: Obstetrics and Gynecology

## 2020-09-09 ENCOUNTER — Telehealth: Payer: Self-pay | Admitting: Obstetrics and Gynecology

## 2020-09-09 ENCOUNTER — Encounter: Payer: Self-pay | Admitting: Obstetrics and Gynecology

## 2020-09-09 ENCOUNTER — Ambulatory Visit (INDEPENDENT_AMBULATORY_CARE_PROVIDER_SITE_OTHER): Admitting: Obstetrics and Gynecology

## 2020-09-09 VITALS — BP 152/92 | HR 83 | Wt 230.7 lb

## 2020-09-09 DIAGNOSIS — Z3483 Encounter for supervision of other normal pregnancy, third trimester: Secondary | ICD-10-CM

## 2020-09-09 DIAGNOSIS — Z3A34 34 weeks gestation of pregnancy: Secondary | ICD-10-CM

## 2020-09-09 DIAGNOSIS — I10 Essential (primary) hypertension: Secondary | ICD-10-CM

## 2020-09-09 LAB — POCT URINALYSIS DIPSTICK OB
Bilirubin, UA: NEGATIVE
Blood, UA: NEGATIVE
Glucose, UA: NEGATIVE
Ketones, UA: NEGATIVE
Leukocytes, UA: NEGATIVE
Nitrite, UA: NEGATIVE
Spec Grav, UA: 1.01 (ref 1.010–1.025)
Urobilinogen, UA: 0.2 E.U./dL
pH, UA: 7 (ref 5.0–8.0)

## 2020-09-09 NOTE — Addendum Note (Signed)
Addended by: Lady Deutscher on: 09/09/2020 11:44 AM   Modules accepted: Orders

## 2020-09-09 NOTE — Progress Notes (Signed)
[name]: Patient states her blood pressures have been going up at home.  Reports an increase in swelling.  Has intermittent mild headache.  History of preeclampsia and delivery at 34 and 36 weeks.  She is understandably concerned regarding this.  PC ratio sent today.  MFM ultrasound not yet returned from yesterday.  Signs and symptoms of severe preeclampsia discussed-warning signs and when to call the office/hospital discussed.

## 2020-09-09 NOTE — Telephone Encounter (Signed)
Discussed patient's concerns. Notes that she is beginning to feel like she did with her previous pregnancies when she developed pre-eclampsia. BP was elevated today in clinic, she also has a headache.  Is concerned as she feels she knows her body and has had 2 preterm deliveries due to pre-eclampsia.  I advised that her P/C ratio was drawn today, awaiting results. Had previous lab done on 7/7 with elevated ratio at that time (429).  Likely is developing some level of pre-eclampisa.  Discussed use of Tylenol, given PIH precautions, advised on use of Tylenol for headache. Advised to f/u in triage if symptoms continue to worsen. Would likely recommend course of antenatal steroids due to history. Can f/u in clinic tomorrow if she is not seen in triage this evening.    Dr. Valentino Saxon

## 2020-09-09 NOTE — Telephone Encounter (Signed)
Patient called and stated that she was in the office this morning and was seen by Dr Logan Bores.  She stated that she feels that her situation was not taken serious with him and she was not happy with her appt.  Patient is asking that you please give her a call.  She has some symptoms that she feels is very serious and since she is a high risk patient she feels it needs to be discussed and not brushed off

## 2020-09-10 ENCOUNTER — Ambulatory Visit (INDEPENDENT_AMBULATORY_CARE_PROVIDER_SITE_OTHER): Admitting: Obstetrics and Gynecology

## 2020-09-10 ENCOUNTER — Encounter: Payer: Self-pay | Admitting: Obstetrics and Gynecology

## 2020-09-10 ENCOUNTER — Other Ambulatory Visit: Payer: Self-pay

## 2020-09-10 VITALS — BP 133/94 | HR 103 | Ht 67.0 in | Wt 231.3 lb

## 2020-09-10 DIAGNOSIS — Z8751 Personal history of pre-term labor: Secondary | ICD-10-CM | POA: Diagnosis not present

## 2020-09-10 DIAGNOSIS — R030 Elevated blood-pressure reading, without diagnosis of hypertension: Secondary | ICD-10-CM

## 2020-09-10 DIAGNOSIS — O09293 Supervision of pregnancy with other poor reproductive or obstetric history, third trimester: Secondary | ICD-10-CM

## 2020-09-10 LAB — PROTEIN / CREATININE RATIO, URINE
Creatinine, Urine: 109.7 mg/dL
Protein, Ur: 13.4 mg/dL
Protein/Creat Ratio: 122 mg/g creat (ref 0–200)

## 2020-09-10 MED ORDER — BETAMETHASONE SOD PHOS & ACET 6 (3-3) MG/ML IJ SUSP
12.0000 mg | INTRAMUSCULAR | Status: AC
  Administered 2020-09-10: 12 mg via INTRAMUSCULAR

## 2020-09-10 NOTE — Progress Notes (Signed)
Patient presents for 1st betamethasone injection due to risk of another preterm delivery secondary to h/o pre-eclampsia with preterm deliveries.

## 2020-09-11 ENCOUNTER — Ambulatory Visit (INDEPENDENT_AMBULATORY_CARE_PROVIDER_SITE_OTHER): Admitting: Obstetrics and Gynecology

## 2020-09-11 VITALS — BP 123/82 | HR 101

## 2020-09-11 DIAGNOSIS — O09293 Supervision of pregnancy with other poor reproductive or obstetric history, third trimester: Secondary | ICD-10-CM | POA: Diagnosis not present

## 2020-09-11 DIAGNOSIS — O09892 Supervision of other high risk pregnancies, second trimester: Secondary | ICD-10-CM | POA: Diagnosis not present

## 2020-09-11 MED ORDER — BETAMETHASONE SOD PHOS & ACET 6 (3-3) MG/ML IJ SUSP
12.0000 mg | Freq: Once | INTRAMUSCULAR | Status: AC
  Administered 2020-09-11: 12 mg via INTRAMUSCULAR

## 2020-09-11 NOTE — Progress Notes (Signed)
Patient came in to get her second dose of Betamethasone. She tolerated injection well. She said her blood pressure has been in the range of 137/89-157/108 at home. Her blood pressure in the office was 123/82 with heart rate of 101. She received 3 ml in the office. I was handed the vial with no instructions regarding dose. This was my first time giving this medication.

## 2020-09-16 ENCOUNTER — Ambulatory Visit (INDEPENDENT_AMBULATORY_CARE_PROVIDER_SITE_OTHER): Admitting: Certified Nurse Midwife

## 2020-09-16 ENCOUNTER — Other Ambulatory Visit: Payer: Self-pay

## 2020-09-16 ENCOUNTER — Encounter: Payer: Self-pay | Admitting: Certified Nurse Midwife

## 2020-09-16 VITALS — BP 121/87 | HR 101 | Wt 230.5 lb

## 2020-09-16 DIAGNOSIS — Z3A35 35 weeks gestation of pregnancy: Secondary | ICD-10-CM

## 2020-09-16 DIAGNOSIS — Z3403 Encounter for supervision of normal first pregnancy, third trimester: Secondary | ICD-10-CM

## 2020-09-16 LAB — POCT URINALYSIS DIPSTICK OB
Bilirubin, UA: NEGATIVE
Glucose, UA: NEGATIVE
Ketones, UA: NEGATIVE
Leukocytes, UA: NEGATIVE
Nitrite, UA: NEGATIVE
Spec Grav, UA: 1.015 (ref 1.010–1.025)
Urobilinogen, UA: 0.2 E.U./dL
pH, UA: 6.5 (ref 5.0–8.0)

## 2020-09-16 NOTE — Progress Notes (Signed)
[name] doing well, feeling good movement. States that her bp's are sometimes elevated at home. She has "good and bad days". Some times not feeling as well as others. C/o vaginal pressure. Today she has plus one swelling. She denies headache but has had them throughout her pregnancy but not at the "migrane level". She denies epigastric pain . BP today stable. Discussed complete pre e labs next visit. Warning signs reviewed. GBS swab reviewed for next visit. PT plans BTL if she has a c section. IF not her partner will have vasectomy. BTL consent form completed today. Follow up 1 wk NST and [name] .   Doreene Burke, CNM

## 2020-09-16 NOTE — Patient Instructions (Signed)

## 2020-09-16 NOTE — Progress Notes (Signed)
[name]: She is doing well. No new concerns today. ?

## 2020-09-18 ENCOUNTER — Other Ambulatory Visit: Payer: Self-pay | Admitting: Obstetrics and Gynecology

## 2020-09-22 ENCOUNTER — Other Ambulatory Visit: Payer: Self-pay

## 2020-09-22 ENCOUNTER — Ambulatory Visit (INDEPENDENT_AMBULATORY_CARE_PROVIDER_SITE_OTHER): Admitting: Certified Nurse Midwife

## 2020-09-22 ENCOUNTER — Other Ambulatory Visit

## 2020-09-22 ENCOUNTER — Other Ambulatory Visit: Payer: Self-pay | Admitting: Certified Nurse Midwife

## 2020-09-22 VITALS — BP 138/92 | HR 99 | Wt 230.7 lb

## 2020-09-22 DIAGNOSIS — O10919 Unspecified pre-existing hypertension complicating pregnancy, unspecified trimester: Secondary | ICD-10-CM | POA: Diagnosis not present

## 2020-09-22 DIAGNOSIS — Z3483 Encounter for supervision of other normal pregnancy, third trimester: Secondary | ICD-10-CM

## 2020-09-22 DIAGNOSIS — Z113 Encounter for screening for infections with a predominantly sexual mode of transmission: Secondary | ICD-10-CM

## 2020-09-22 DIAGNOSIS — Z3A36 36 weeks gestation of pregnancy: Secondary | ICD-10-CM

## 2020-09-22 LAB — POCT URINALYSIS DIPSTICK OB
Bilirubin, UA: NEGATIVE
Blood, UA: NEGATIVE
Glucose, UA: NEGATIVE
Ketones, UA: NEGATIVE
Leukocytes, UA: NEGATIVE
Nitrite, UA: NEGATIVE
POC,PROTEIN,UA: NEGATIVE
Spec Grav, UA: 1.01 (ref 1.010–1.025)
Urobilinogen, UA: 0.2 E.U./dL
pH, UA: 6.5 (ref 5.0–8.0)

## 2020-09-22 NOTE — Patient Instructions (Signed)
Preeclampsia and Eclampsia  Preeclampsia is a serious condition that may develop during pregnancy. This condition involves high blood pressure during pregnancy and causes symptoms such as headaches, vision changes, and increased swelling in the legs, hands, and face. Preeclampsia occurs after 20 weeks of pregnancy. Eclampsia is a seizure that happens from worsening preeclampsia. Diagnosing and managing preeclampsia early is important. If not treated early, it can cause serious problems for mother and baby.  There is no cure for this condition. However, during pregnancy, delivering the baby may be the best treatment for preeclampsia or eclampsia. For most women, symptoms of preeclampsia and eclampsia go away after giving birth. In rare cases, a woman may develop preeclampsia or eclampsia after giving birth. This usually occurs within 48 hours after childbirth but may occur up to 6 weeks after giving birth.  What are the causes?  The cause of this condition is not known.  What increases the risk?  The following factors make you more likely to develop preeclampsia:  Being pregnant for the first time or being pregnant with multiples.  Having had preeclampsia or a condition called hemolysis, elevated liver enzymes, and low platelet count (HELLP)syndrome during a past pregnancy.  Having a family history of preeclampsia.  Being older than age 35.  Being obese.  Becoming pregnant through fertility treatments.  Conditions that reduce blood flow or oxygen to your placenta and baby may also increase your risk. These include:  High blood pressure before, during, or immediately following pregnancy.  Kidney disease.  Diabetes.  Blood clotting disorders.  Autoimmune diseases, such as lupus.  Sleep apnea.  What are the signs or symptoms?  Common symptoms of this condition include:  A severe, throbbing headache that does not go away.  Vision problems, such as blurred or double vision and light sensitivity.  Pain in the stomach,  especially the right upper region.  Pain in the shoulder.  Other symptoms that may develop as the condition gets worse include:  Sudden weight gain because of fluid buildup in the body. This causes swelling of the face, hands, legs, and feet.  Severe nausea and vomiting.  Urinating less than usual.  Shortness of breath.  Seizures.  How is this diagnosed?    Your health care provider will ask you about symptoms and check for signs of preeclampsia during your prenatal visits. You will also have routine tests, including:  Checking your blood pressure.  Urine tests to check for protein.  Blood tests to assess your organ function.  Monitoring your baby's heart rate.  Ultrasounds to check fetal growth.  How is this treated?  You and your health care provider will determine the treatment that is best for you. Treatment may include:  Frequent prenatal visits to check for preeclampsia.  Medicine to lower your blood pressure.  Medicine to prevent seizures.  Low-dose aspirin during your pregnancy.  Staying in the hospital, in severe cases. You will be given medicines to control your blood pressure and the amount of fluids in your body.  Delivering your baby.  Work with your health care provider to manage any chronic health conditions, such as diabetes or kidney problems. Also, work with your health care provider to manage weight gain during pregnancy.  Follow these instructions at home:  Eating and drinking  Drink enough fluid to keep your urine pale yellow.  Avoid caffeine. Caffeine may increase blood pressure and heart rate and lead to dehydration.  Reduce the amount of salt that you eat.  Lifestyle    Do not use any products that contain nicotine or tobacco. These products include cigarettes, chewing tobacco, and vaping devices, such as e-cigarettes. If you need help quitting, ask your health care provider.  Do not use alcohol or drugs.  Avoid stress as much as possible.  Rest and get plenty of sleep.  General  instructions    Take over-the-counter and prescription medicines only as told by your health care provider.  When lying down, lie on your left side. This keeps pressure off your major blood vessels.  When sitting or lying down, raise (elevate) your feet. Try putting pillows underneath your lower legs.  Exercise regularly. Ask your health care provider what kinds of exercise are best for you.  Check your blood pressure as often as recommended by your health care provider.  Keep all prenatal and follow-up visits. This is important.  Contact a health care provider if:  You have symptoms that may need treatment or closer monitoring. These include:  Headaches.  Stomach pain or nausea and vomiting.  Shoulder pain.  Vision problems, such as spots in front of your eyes or blurry vision.  Sudden weight gain or increased swelling in your face, hands, legs, and feet.  Increased anxiety or feeling of impending doom.  Signs or symptoms of labor.  Get help right away if:  You have any of the following symptoms:  A seizure.  Shortness of breath or trouble breathing.  Trouble speaking or slurred speech.  Fainting.  Chest pain.  These symptoms may represent a serious problem that is an emergency. Do not wait to see if the symptoms will go away. Get medical help right away. Call your local emergency services (911 in the [place] Do not drive yourself to the hospital.  Summary  Preeclampsia is a serious condition that may develop during pregnancy.  Diagnosing and treating preeclampsia early is very important.  Keep all prenatal and follow-up visits. This is important.  Get help right away if you have a seizure, shortness of breath or trouble breathing, trouble speaking or slurred speech, chest pain, or fainting.  This information is not intended to replace advice given to you by your health care provider. Make sure you discuss any questions you have with your health care provider.  Document Revised: 09/12/2019 Document Reviewed:  09/12/2019  Elsevier Patient Education  2022 Elsevier Inc.

## 2020-09-22 NOTE — Progress Notes (Signed)
[name] doing well, feeling movement. NST today for chronic hypertension not on medications. Discussed induction 37-38 wks if BP remains stable. GBS and cultures collected today.   NST: Reactive  Baseline : 140 Variability : moderate Accelerations: present  Decelerations: variable -pt on her back , turned -resolved( Dr. Valentino Saxon reviewed)  Ctx:  absent

## 2020-09-23 LAB — COMPREHENSIVE METABOLIC PANEL
ALT: 12 IU/L (ref 0–32)
AST: 12 IU/L (ref 0–40)
Albumin/Globulin Ratio: 1.3 (ref 1.2–2.2)
Albumin: 3.5 g/dL — ABNORMAL LOW (ref 3.9–5.0)
Alkaline Phosphatase: 143 IU/L — ABNORMAL HIGH (ref 44–121)
BUN/Creatinine Ratio: 15 (ref 9–23)
BUN: 8 mg/dL (ref 6–20)
Bilirubin Total: <0.2 mg/dL (ref 0.0–1.2)
CO2: 20 mmol/L (ref 20–29)
Calcium: 9.1 mg/dL (ref 8.7–10.2)
Chloride: 105 mmol/L (ref 96–106)
Creatinine, Ser: 0.54 mg/dL — ABNORMAL LOW (ref 0.57–1.00)
Globulin, Total: 2.8 g/dL (ref 1.5–4.5)
Glucose: 76 mg/dL (ref 65–99)
Potassium: 4.3 mmol/L (ref 3.5–5.2)
Sodium: 141 mmol/L (ref 134–144)
Total Protein: 6.3 g/dL (ref 6.0–8.5)
eGFR: 130 mL/min/{1.73_m2} (ref >59)

## 2020-09-23 LAB — CBC WITH DIFFERENTIAL/PLATELET
Basophils Absolute: 0 10*3/uL (ref 0.0–0.2)
Basos: 0 %
EOS (ABSOLUTE): 0.1 10*3/uL (ref 0.0–0.4)
Eos: 1 %
Hematocrit: 35.7 % (ref 34.0–46.6)
Hemoglobin: 11.9 g/dL (ref 11.1–15.9)
Immature Grans (Abs): 0.1 10*3/uL (ref 0.0–0.1)
Immature Granulocytes: 1 %
Lymphocytes Absolute: 2.2 10*3/uL (ref 0.7–3.1)
Lymphs: 19 %
MCH: 28.9 pg (ref 26.6–33.0)
MCHC: 33.3 g/dL (ref 31.5–35.7)
MCV: 87 fL (ref 79–97)
Monocytes Absolute: 0.9 10*3/uL (ref 0.1–0.9)
Monocytes: 7 %
Neutrophils Absolute: 8.5 10*3/uL — ABNORMAL HIGH (ref 1.4–7.0)
Neutrophils: 72 %
Platelets: 275 10*3/uL (ref 150–450)
RBC: 4.12 x10E6/uL (ref 3.77–5.28)
RDW: 13.5 % (ref 11.7–15.4)
WBC: 11.8 10*3/uL — ABNORMAL HIGH (ref 3.4–10.8)

## 2020-09-23 LAB — PROTEIN / CREATININE RATIO, URINE
Creatinine, Urine: 56.1 mg/dL
Protein, Ur: 18.8 mg/dL
Protein/Creat Ratio: 335 mg/g creat — ABNORMAL HIGH (ref 0–200)

## 2020-09-23 NOTE — Addendum Note (Signed)
Addended by: Marchelle Folks on: 09/23/2020 03:44 PM   Modules accepted: Orders

## 2020-09-25 LAB — GC/CHLAMYDIA PROBE AMP
Chlamydia trachomatis, NAA: NEGATIVE
Neisseria Gonorrhoeae by PCR: NEGATIVE

## 2020-09-29 ENCOUNTER — Other Ambulatory Visit

## 2020-09-29 ENCOUNTER — Ambulatory Visit (INDEPENDENT_AMBULATORY_CARE_PROVIDER_SITE_OTHER): Admitting: Certified Nurse Midwife

## 2020-09-29 ENCOUNTER — Other Ambulatory Visit: Payer: Self-pay

## 2020-09-29 ENCOUNTER — Encounter: Payer: Self-pay | Admitting: Certified Nurse Midwife

## 2020-09-29 VITALS — BP 142/99 | HR 93 | Wt 237.6 lb

## 2020-09-29 DIAGNOSIS — Z3A37 37 weeks gestation of pregnancy: Secondary | ICD-10-CM

## 2020-09-29 DIAGNOSIS — Z3483 Encounter for supervision of other normal pregnancy, third trimester: Secondary | ICD-10-CM

## 2020-09-29 DIAGNOSIS — O10919 Unspecified pre-existing hypertension complicating pregnancy, unspecified trimester: Secondary | ICD-10-CM | POA: Diagnosis not present

## 2020-09-29 LAB — POCT URINALYSIS DIPSTICK OB
Bilirubin, UA: NEGATIVE
Blood, UA: NEGATIVE
Glucose, UA: NEGATIVE
Ketones, UA: NEGATIVE
Leukocytes, UA: NEGATIVE
Nitrite, UA: NEGATIVE
POC,PROTEIN,UA: NEGATIVE
Spec Grav, UA: 1.015 (ref 1.010–1.025)
Urobilinogen, UA: 0.2 E.U./dL
pH, UA: 6.5 (ref 5.0–8.0)

## 2020-09-29 NOTE — Progress Notes (Signed)
Pt has be re-swabbed at appt., waiting for results

## 2020-09-29 NOTE — Addendum Note (Signed)
Addended by: Lady Deutscher on: 09/29/2020 04:10 PM   Modules accepted: Orders

## 2020-09-29 NOTE — Patient Instructions (Signed)
Labor Induction ?Labor induction is when steps are taken to cause a pregnant woman to begin the labor process. Most women go into labor on their own between 37 weeks and 42 weeks of pregnancy. When this does not happen, or when there is a medical need for labor to begin, steps may be taken to induce, or bring on, labor. ?Labor induction causes a pregnant woman's uterus to contract. It also causes the cervix to soften (ripen), open (dilate), and thin out. Usually, labor is not induced before 39 weeks of pregnancy unless there is a medical reason to do so. ?When is labor induction considered? ?Labor induction may be right for you if: ?Your pregnancy lasts longer than 41 to 42 weeks. ?Your placenta is separating from your uterus (placental abruption). ?You have a rupture of membranes and your labor does not begin. ?You have health problems, like diabetes or high blood pressure (preeclampsia) during your pregnancy. ?Your baby has stopped growing or does not have enough amniotic fluid. ?Before labor induction begins, your health care provider will consider the following factors: ?Your medical condition and the baby's condition. ?How many weeks you have been pregnant. ?How mature the baby's lungs are. ?The condition of your cervix. ?The position of the baby. ?The size of your birth canal. ?Tell a health care provider about: ?Any allergies you have. ?All medicines you are taking, including vitamins, herbs, eye drops, creams, and over-the-counter medicines. ?Any problems you or your family members have had with anesthetic medicines. ?Any surgeries you have had. ?Any blood disorders you have. ?Any medical conditions you have. ?What are the risks? ?Generally, this is a safe procedure. However, problems may occur, including: ?Failed induction. ?Changes in fetal heart rate, such as being too high, too low, or irregular (erratic). ?Infection in the mother or the baby. ?Increased risk of having a cesarean delivery. ?Breaking off  (abruption) of the placenta from the uterus. This is rare. ?Rupture of the uterus. This is very rare. ?Your baby could fail to get enough blood flow or oxygen. This can be life-threatening. ?When induction is needed for medical reasons, the benefits generally outweigh the risks. ?What happens during the procedure? ?During the procedure, your health care provider will use one of these methods to induce labor: ?Stripping the membranes. In this method, the amniotic sac tissue is gently separated from the cervix. This causes the following to happen: ?Your cervix stretches, which in turn causes the release of prostaglandins. ?Prostaglandins induce labor and cause the uterus to contract. ?This procedure is often done in an office visit. You will be sent home to wait for contractions to begin. ?Prostaglandin medicine. This medicine starts contractions and causes the cervix to dilate and ripen. This can be taken by mouth (orally) or by being inserted into the vagina (suppository). ?Inserting a small, thin tube (catheter) with a balloon into the vagina and then expanding the balloon with water to dilate the cervix. ?Breaking the water. In this method, a small instrument is used to make a small hole in the amniotic sac. This eventually causes the amniotic sac to break. Contractions should begin within a few hours. ?Medicine to trigger or strengthen contractions. This medicine is given through an IV that is inserted into a vein in your arm. ?This procedure may vary among health care providers and hospitals. ?Where to find more information ?March of D[employer]: www.marchofdimes.org ?The American College of Obstetricians and Gynecologists: www.acog.org ?Summary ?Labor induction causes a pregnant woman's uterus to contract. It also causes the cervix   to soften (ripen), open (dilate), and thin out. ?Labor is usually not induced before 39 weeks of pregnancy unless there is a medical reason to do so. ?When induction is needed for medical  reasons, the benefits generally outweigh the risks. ?Talk with your health care provider about which methods of labor induction are right for you. ?This information is not intended to replace advice given to you by your health care provider. Make sure you discuss any questions you have with your health care provider. ?Document Revised: 10/03/2019 Document Reviewed: 10/03/2019 ?Elsevier Patient Education ? 2022 Elsevier Inc. ? ?

## 2020-09-29 NOTE — Progress Notes (Signed)
[name] and NST for chronic hypertension. Rpt BP 143/78. Inductions scheduled 12/2 @0500 . GBS swab repeated today due to lost swab last visit. SVE : 1-2/50-3. She denies any changes in in headaches, swelling, epigastric pain .   11-03-1993  Baseline 130 Variability: moderate Accelerations: present  Decelerations: absent   Ctx:  Occasional   [id number], CNM

## 2020-10-01 ENCOUNTER — Other Ambulatory Visit: Payer: Self-pay

## 2020-10-01 ENCOUNTER — Other Ambulatory Visit
Admission: RE | Admit: 2020-10-01 | Discharge: 2020-10-01 | Disposition: A | Discharge status: Home / Self Care | Source: Ambulatory Visit | Attending: Certified Nurse Midwife | Admitting: Certified Nurse Midwife

## 2020-10-01 DIAGNOSIS — Z01812 Encounter for preprocedural laboratory examination: Secondary | ICD-10-CM | POA: Insufficient documentation

## 2020-10-01 DIAGNOSIS — Z20822 Contact with and (suspected) exposure to covid-19: Secondary | ICD-10-CM | POA: Insufficient documentation

## 2020-10-01 LAB — STREP GP B NAA: Strep Gp B NAA: POSITIVE — AB

## 2020-10-02 LAB — SARS CORONAVIRUS 2 (TAT 6-24 HRS): SARS Coronavirus 2: NEGATIVE

## 2020-10-04 ENCOUNTER — Inpatient Hospital Stay: Admitting: Anesthesiology

## 2020-10-04 ENCOUNTER — Other Ambulatory Visit: Payer: Self-pay

## 2020-10-04 ENCOUNTER — Inpatient Hospital Stay
Admission: EM | Admit: 2020-10-04 | Discharge: 2020-10-06 | DRG: 807 | Disposition: A | Discharge status: Home / Self Care | Attending: Certified Nurse Midwife | Admitting: Certified Nurse Midwife

## 2020-10-04 ENCOUNTER — Encounter: Payer: Self-pay | Admitting: Certified Nurse Midwife

## 2020-10-04 DIAGNOSIS — O98813 Other maternal infectious and parasitic diseases complicating pregnancy, third trimester: Secondary | ICD-10-CM

## 2020-10-04 DIAGNOSIS — O34219 Maternal care for unspecified type scar from previous cesarean delivery: Secondary | ICD-10-CM | POA: Diagnosis present

## 2020-10-04 DIAGNOSIS — E669 Obesity, unspecified: Secondary | ICD-10-CM | POA: Diagnosis not present

## 2020-10-04 DIAGNOSIS — O09213 Supervision of pregnancy with history of pre-term labor, third trimester: Secondary | ICD-10-CM

## 2020-10-04 DIAGNOSIS — O99824 Streptococcus B carrier state complicating childbirth: Secondary | ICD-10-CM | POA: Diagnosis present

## 2020-10-04 DIAGNOSIS — Z20822 Contact with and (suspected) exposure to covid-19: Secondary | ICD-10-CM | POA: Diagnosis present

## 2020-10-04 DIAGNOSIS — O99214 Obesity complicating childbirth: Secondary | ICD-10-CM

## 2020-10-04 DIAGNOSIS — O1002 Pre-existing essential hypertension complicating childbirth: Secondary | ICD-10-CM | POA: Diagnosis present

## 2020-10-04 DIAGNOSIS — Z3A37 37 weeks gestation of pregnancy: Secondary | ICD-10-CM | POA: Diagnosis not present

## 2020-10-04 DIAGNOSIS — B951 Streptococcus, group B, as the cause of diseases classified elsewhere: Secondary | ICD-10-CM

## 2020-10-04 LAB — PROTEIN / CREATININE RATIO, URINE
Creatinine, Urine: 154 mg/dL
Protein Creatinine Ratio: 0.14 mg/mg{Cre} (ref 0.00–0.15)
Total Protein, Urine: 22 mg/dL

## 2020-10-04 LAB — RPR: RPR Ser Ql: NONREACTIVE

## 2020-10-04 LAB — COMPREHENSIVE METABOLIC PANEL
ALT: 16 U/L (ref 0–44)
AST: 21 U/L (ref 15–41)
Albumin: 2.7 g/dL — ABNORMAL LOW (ref 3.5–5.0)
Alkaline Phosphatase: 133 U/L — ABNORMAL HIGH (ref 38–126)
Anion gap: 11 (ref 5–15)
BUN: 9 mg/dL (ref 6–20)
CO2: 20 mmol/L — ABNORMAL LOW (ref 22–32)
Calcium: 9.1 mg/dL (ref 8.9–10.3)
Chloride: 105 mmol/L (ref 98–111)
Creatinine, Ser: 0.57 mg/dL (ref 0.44–1.00)
GFR, Estimated: >60 mL/min (ref >60)
Glucose, Bld: 108 mg/dL — ABNORMAL HIGH (ref 70–99)
Potassium: 3.5 mmol/L (ref 3.5–5.1)
Sodium: 136 mmol/L (ref 135–145)
Total Bilirubin: 0.7 mg/dL (ref 0.3–1.2)
Total Protein: 6.2 g/dL — ABNORMAL LOW (ref 6.5–8.1)

## 2020-10-04 LAB — CBC
HCT: 34.1 % — ABNORMAL LOW (ref 36.0–46.0)
Hemoglobin: 11.4 g/dL — ABNORMAL LOW (ref 12.0–15.0)
MCH: 29.4 pg (ref 26.0–34.0)
MCHC: 33.4 g/dL (ref 30.0–36.0)
MCV: 87.9 fL (ref 80.0–100.0)
Platelets: 286 10*3/uL (ref 150–400)
RBC: 3.88 MIL/uL (ref 3.87–5.11)
RDW: 13.8 % (ref 11.5–15.5)
WBC: 10.1 10*3/uL (ref 4.0–10.5)
nRBC: 0 % (ref 0.0–0.2)

## 2020-10-04 MED ORDER — LACTATED RINGERS IV SOLN: 500.0000 mL | Freq: Once | INTRAVENOUS | Status: DC

## 2020-10-04 MED ORDER — DIBUCAINE (PERIANAL) 1 % EX OINT: 1.0000 "application " | TOPICAL_OINTMENT | CUTANEOUS | Status: DC | PRN

## 2020-10-04 MED ORDER — FERROUS SULFATE 325 (65 FE) MG PO TABS
325.0000 mg | ORAL_TABLET | Freq: Every day | ORAL | Status: DC
  Administered 2020-10-05 – 2020-10-06 (×2): 325 mg via ORAL
  Filled 2020-10-04 (×2): qty 1

## 2020-10-04 MED ORDER — OXYTOCIN BOLUS FROM INFUSION
333.0000 mL | Freq: Once | INTRAVENOUS | Status: AC
Start: 2020-10-04 — End: 2020-10-04
  Administered 2020-10-04: 333 mL via INTRAVENOUS

## 2020-10-04 MED ORDER — PENICILLIN G POT IN DEXTROSE 60000 UNIT/ML IV SOLN
3.0000 10*6.[IU] | INTRAVENOUS | Status: DC
  Administered 2020-10-04 (×3): 3 10*6.[IU] via INTRAVENOUS
  Filled 2020-10-04 (×3): qty 50

## 2020-10-04 MED ORDER — OXYCODONE-ACETAMINOPHEN 5-325 MG PO TABS: 2.0000 | ORAL_TABLET | ORAL | Status: DC | PRN

## 2020-10-04 MED ORDER — OXYTOCIN-SODIUM CHLORIDE 30-0.9 UT/500ML-% IV SOLN
1.0000 m[IU]/min | INTRAVENOUS | Status: DC
Start: 2020-10-04 — End: 2020-10-04

## 2020-10-04 MED ORDER — LACTATED RINGERS IV SOLN: INTRAVENOUS | Status: DC

## 2020-10-04 MED ORDER — PHENYLEPHRINE 40 MCG/ML (10ML) SYRINGE FOR IV PUSH (FOR BLOOD PRESSURE SUPPORT)
80.0000 ug | PREFILLED_SYRINGE | INTRAVENOUS | Status: DC | PRN
  Filled 2020-10-04: qty 10

## 2020-10-04 MED ORDER — SODIUM CHLORIDE 0.9 % IV SOLN
INTRAVENOUS | Status: DC | PRN
  Administered 2020-10-04 (×3): 5 mL via EPIDURAL

## 2020-10-04 MED ORDER — FENTANYL-BUPIVACAINE-NACL 0.5-0.125-0.9 MG/250ML-% EP SOLN: 12.0000 mL/h | EPIDURAL | Status: DC | PRN

## 2020-10-04 MED ORDER — TERBUTALINE SULFATE 1 MG/ML IJ SOLN: 0.2500 mg | Freq: Once | INTRAMUSCULAR | Status: DC | PRN

## 2020-10-04 MED ORDER — FENTANYL-BUPIVACAINE-NACL 0.5-0.125-0.9 MG/250ML-% EP SOLN
EPIDURAL | Status: AC
  Filled 2020-10-04: qty 250

## 2020-10-04 MED ORDER — LIDOCAINE HCL (PF) 1 % IJ SOLN
30.0000 mL | INTRAMUSCULAR | Status: DC | PRN
  Filled 2020-10-04: qty 30

## 2020-10-04 MED ORDER — WITCH HAZEL-GLYCERIN EX PADS
1.0000 "application " | MEDICATED_PAD | CUTANEOUS | Status: DC | PRN
  Administered 2020-10-04: 1 via TOPICAL
  Filled 2020-10-04: qty 100

## 2020-10-04 MED ORDER — LACTATED RINGERS IV SOLN
500.0000 mL | INTRAVENOUS | Status: DC | PRN
  Administered 2020-10-04: 500 mL via INTRAVENOUS

## 2020-10-04 MED ORDER — LABETALOL HCL 5 MG/ML IV SOLN: 40.0000 mg | INTRAVENOUS | Status: DC | PRN

## 2020-10-04 MED ORDER — COCONUT OIL OIL
1.0000 "application " | TOPICAL_OIL | Status: DC | PRN
  Administered 2020-10-04: 1 via TOPICAL
  Filled 2020-10-04: qty 120

## 2020-10-04 MED ORDER — DOCUSATE SODIUM 100 MG PO CAPS
100.0000 mg | ORAL_CAPSULE | Freq: Two times a day (BID) | ORAL | Status: DC
  Administered 2020-10-04 – 2020-10-06 (×4): 100 mg via ORAL
  Filled 2020-10-04 (×4): qty 1

## 2020-10-04 MED ORDER — BENZOCAINE-MENTHOL 20-0.5 % EX AERO: 1.0000 "application " | INHALATION_SPRAY | CUTANEOUS | Status: DC | PRN

## 2020-10-04 MED ORDER — LABETALOL HCL 5 MG/ML IV SOLN
20.0000 mg | INTRAVENOUS | Status: DC | PRN
  Administered 2020-10-04: 20 mg via INTRAVENOUS
  Filled 2020-10-04: qty 4

## 2020-10-04 MED ORDER — OXYCODONE-ACETAMINOPHEN 5-325 MG PO TABS: 1.0000 | ORAL_TABLET | ORAL | Status: DC | PRN

## 2020-10-04 MED ORDER — EPHEDRINE 5 MG/ML INJ
10.0000 mg | INTRAVENOUS | Status: DC | PRN
  Filled 2020-10-04: qty 2

## 2020-10-04 MED ORDER — ONDANSETRON HCL 4 MG/2ML IJ SOLN: 4.0000 mg | Freq: Four times a day (QID) | INTRAMUSCULAR | Status: DC | PRN

## 2020-10-04 MED ORDER — LIDOCAINE-EPINEPHRINE (PF) 1.5 %-1:200000 IJ SOLN: INTRAMUSCULAR | Status: DC | PRN

## 2020-10-04 MED ORDER — ACETAMINOPHEN 325 MG PO TABS: 650.0000 mg | ORAL_TABLET | Freq: Four times a day (QID) | ORAL | Status: DC | PRN

## 2020-10-04 MED ORDER — ACETAMINOPHEN 325 MG PO TABS
ORAL_TABLET | ORAL | Status: AC
  Administered 2020-10-04: 650 mg via ORAL
  Filled 2020-10-04: qty 2

## 2020-10-04 MED ORDER — SIMETHICONE 80 MG PO CHEW: 80.0000 mg | CHEWABLE_TABLET | ORAL | Status: DC | PRN

## 2020-10-04 MED ORDER — LIDOCAINE HCL (PF) 1 % IJ SOLN
INTRAMUSCULAR | Status: DC | PRN
  Administered 2020-10-04: 1 mL via SUBCUTANEOUS

## 2020-10-04 MED ORDER — SODIUM CHLORIDE 0.9 % IV SOLN
5.0000 10*6.[IU] | Freq: Once | INTRAVENOUS | Status: AC
  Administered 2020-10-04: 5 10*6.[IU] via INTRAVENOUS
  Filled 2020-10-04: qty 5

## 2020-10-04 MED ORDER — FENTANYL-BUPIVACAINE-NACL 0.5-0.125-0.9 MG/250ML-% EP SOLN
EPIDURAL | Status: DC | PRN
  Administered 2020-10-04: 12 mL/h via EPIDURAL

## 2020-10-04 MED ORDER — IBUPROFEN 600 MG PO TABS
600.0000 mg | ORAL_TABLET | Freq: Four times a day (QID) | ORAL | Status: DC
  Administered 2020-10-04 – 2020-10-06 (×7): 600 mg via ORAL
  Filled 2020-10-04 (×7): qty 1

## 2020-10-04 MED ORDER — OXYTOCIN-SODIUM CHLORIDE 30-0.9 UT/500ML-% IV SOLN
1.0000 m[IU]/min | INTRAVENOUS | Status: DC
  Administered 2020-10-04: 2 m[IU]/min via INTRAVENOUS

## 2020-10-04 MED ORDER — LIDOCAINE-EPINEPHRINE (PF) 1.5 %-1:200000 IJ SOLN
INTRAMUSCULAR | Status: DC | PRN
  Administered 2020-10-04: 3 mL via EPIDURAL

## 2020-10-04 MED ORDER — DIPHENHYDRAMINE HCL 50 MG/ML IJ SOLN: 12.5000 mg | INTRAMUSCULAR | Status: DC | PRN

## 2020-10-04 MED ORDER — SENNOSIDES-DOCUSATE SODIUM 8.6-50 MG PO TABS
2.0000 | ORAL_TABLET | ORAL | Status: DC
  Administered 2020-10-04: 2 via ORAL
  Filled 2020-10-04: qty 2

## 2020-10-04 MED ORDER — OXYTOCIN-SODIUM CHLORIDE 30-0.9 UT/500ML-% IV SOLN
2.5000 [IU]/h | INTRAVENOUS | Status: DC
  Filled 2020-10-04 (×2): qty 500

## 2020-10-04 MED ORDER — LABETALOL HCL 5 MG/ML IV SOLN: 80.0000 mg | INTRAVENOUS | Status: DC | PRN

## 2020-10-04 MED ORDER — ONDANSETRON HCL 4 MG/2ML IJ SOLN: 4.0000 mg | INTRAMUSCULAR | Status: DC | PRN

## 2020-10-04 MED ORDER — ONDANSETRON HCL 4 MG/2ML IJ SOLN
INTRAMUSCULAR | Status: AC
  Administered 2020-10-04: 4 mg via INTRAVENOUS
  Filled 2020-10-04: qty 2

## 2020-10-04 MED ORDER — PHENYLEPHRINE HCL (PRESSORS) 10 MG/ML IV SOLN
INTRAVENOUS | Status: AC
  Filled 2020-10-04: qty 1

## 2020-10-04 MED ORDER — PRENATAL MULTIVITAMIN CH
1.0000 | ORAL_TABLET | Freq: Every day | ORAL | Status: DC
  Administered 2020-10-05 – 2020-10-06 (×2): 1 via ORAL
  Filled 2020-10-04 (×2): qty 1

## 2020-10-04 MED ORDER — HYDRALAZINE HCL 20 MG/ML IJ SOLN: 10.0000 mg | INTRAMUSCULAR | Status: DC | PRN

## 2020-10-04 MED ORDER — ONDANSETRON HCL 4 MG PO TABS
4.0000 mg | ORAL_TABLET | ORAL | Status: DC | PRN
  Filled 2020-10-04: qty 1

## 2020-10-04 NOTE — Anesthesia Procedure Notes (Signed)
Epidural Patient location during procedure: OB Start time: 10/04/2020 11:43 AM End time: 10/04/2020 12:05 PM  Staffing Anesthesiologist: Foye Deer, MD Performed: anesthesiologist   Preanesthetic Checklist Completed: patient identified, IV checked, site marked, risks and benefits discussed, surgical consent, monitors and equipment checked, pre-op evaluation and timeout performed  Epidural Patient position: sitting Prep: ChloraPrep Patient monitoring: heart rate, continuous pulse ox and blood pressure Approach: midline Location: L3-L4 Injection technique: LOR saline  Needle:  Needle type: Tuohy  Needle gauge: 18 G Needle length: 9 cm Needle insertion depth: 7 cm Catheter type: closed end Catheter size: 20 Guage Catheter at skin depth: 12 cm Test dose: negative and 1.5% lidocaine with Epi 1:200 K  Assessment Events: blood not aspirated and injection not painful  Additional Notes Reason for block:procedure for pain

## 2020-10-04 NOTE — Progress Notes (Signed)
LABOR NOTE   Sydney James 25 y.o.GP@ at [redacted]w[redacted]d  SUBJECTIVE:  Comfortable with epidural , denies pain or pressure Analgesia: Epidural  OBJECTIVE:  BP 124/73   Pulse 72   Temp 97.7 F (36.5 C) (Oral)   Resp 14   Ht 5\' 7"  (1.702 m)   Wt 107.8 kg   LMP 01/06/2020 (Approximate)   SpO2 97%   BMI 37.22 kg/m  Total I/O In: 40 [I.V.:40] Out: -   She has not shown cervical change. CERVIX: 5:  70%:   -2:   posterior:   soft SVE:   Dilation: 5 Effacement (%): 60, 70 Station: -2 Exam by:: A Muhammadali Ries CNM CONTRACTIONS: regular, every 3 minutes FHR: Fetal heart tracing reviewed. Baseline: 120 bpm, Variability: Good {> 6 bpm), Accelerations: Reactive, and Decelerations: occasional variable  Category II    Labs: Lab Results  Component Value Date   WBC 10.1 10/04/2020   HGB 11.4 (L) 10/04/2020   HCT 34.1 (L) 10/04/2020   MCV 87.9 10/04/2020   PLT 286 10/04/2020    ASSESSMENT: 1) Labor curve reviewed.       Progress: Prolonged latent labor.     Membranes: ruptured, clear fluid, IUPC placed           Active Problems:   Labor and delivery, indication for care Chronic hypertension TOLAC GBS positive  PLAN: place IUPC, increase pitocin to 250 MU.    12/04/2020, CNM  10/04/2020 3:24 PM

## 2020-10-04 NOTE — H&P (Signed)
History and Physical   HPI  Sydney James is a 25 y.o. [id number] at [redacted]w[redacted]d Estimated Date of Delivery: 10/20/20 who is being admitted for induction of labor for chronic hypertension not currently on medications. PT has history of Preterm births and previous cesarean section.    OB History  OB History  Gravida Para Term Preterm AB Living  7 2 0 2 4 2   SAB IAB Ectopic Multiple Live Births  3 0 0 0 2    # Outcome Date GA Lbr Len/2nd Weight Sex Delivery Anes PTL Lv  7 Current           6 AB 07/18/19 [redacted]w[redacted]d    SAB     5 Preterm 12/22/17 [redacted]w[redacted]d  2080 g M CS-LTranv EPI, Spinal  LIV     Name: Rayman,PENDINGBABY     Apgar1: 8  Apgar5: 8  4 SAB 09/2016          3 SAB 2018          2 Preterm 01/25/15 [redacted]w[redacted]d 03:30 / 01:45 2840 g M Vag-Spont EPI  LIV     Name: XXXBENNETT,BOY Shabnam     Apgar1: 8  Apgar5: 9  1 SAB 2011            Obstetric Comments  G6- missed AB at [redacted] weeks gestation    PROBLEM LIST  Pregnancy complications or risks: Patient Active Problem List   Diagnosis Date Noted   Labor and delivery, indication for care 10/04/2020   History of preterm delivery, currently pregnant in second trimester 05/08/2020   Chronic hypertension affecting pregnancy 05/08/2020   History of preterm premature rupture of membranes (PROM) in previous pregnancy, currently pregnant in second trimester 05/08/2020   Supervision of high risk pregnancy, antepartum 04/08/2020   DDD (degenerative disc disease), lumbar 11/30/2019   History of migraine headaches 11/30/2019   Essential hypertension 09/05/2019   Sleep disturbance 09/05/2019   Obesity 02/27/2019   Vitamin D deficiency 11/02/2016    Prenatal labs and studies: ABO, Rh: --/--/PENDING (10/02 0546) Antibody: PENDING (10/02 0546) Rubella: 1.54 (03/25 1012) RPR: Reactive (08/03 1109)  HBsAg: Negative (03/25 1012)  HIV: Non Reactive (03/25 1012)  12-30-1998-- (09/27 1705)   Past Medical History:  Diagnosis Date   Lumbar  herniated disc    Migraine    Preeclampsia    PVC's (premature ventricular contractions)    Raynaud's disease    Scoliosis    Back brace for 2 years   Scoliosis      Past Surgical History:  Procedure Laterality Date   CESAREAN SECTION N/A 12/22/2017   Procedure: CESAREAN SECTION;  Surgeon: 12/24/2017, MD;  Location: [facility] ORS;  Service: Obstetrics;  Laterality: N/A;  Female Born @ 42     DILATION AND CURETTAGE OF UTERUS     DILATION AND EVACUATION N/A 03/15/2016   Procedure: DILATATION AND EVACUATION;  Surgeon: 03/17/2016, MD;  Location: [facility] ORS;  Service: Gynecology;  Laterality: N/A;   DILATION AND EVACUATION N/A 07/18/2019   Procedure: DILATATION AND EVACUATION;  Surgeon: 07/20/2019, MD;  Location: [facility] ORS;  Service: Gynecology;  Laterality: N/A;   WISDOM TOOTH EXTRACTION       Medications    Current Discharge Medication List     CONTINUE these medications which have NOT CHANGED   Details  aspirin EC 81 MG tablet Take 1 tablet (81 mg total) by mouth daily. Swallow whole. Start at [redacted] wks pregnant Qty: 30 tablet,  Refills: 11    ondansetron (ZOFRAN-ODT) 4 MG disintegrating tablet DISSOLVE 1 TABLET(4 MG) ON THE TONGUE EVERY 6 HOURS AS NEEDED FOR NAUSEA Qty: 20 tablet, Refills: 0    Prenatal Vit-Fe Fumarate-FA (MULTIVITAMIN-PRENATAL) 27-0.8 MG TABS tablet Take 1 tablet by mouth daily at 12 noon.         Allergies  Aloe, Lubricants, Monistat [miconazole], Sulfa antibiotics, and Tape  Review of Systems  Constitutional: negative Eyes: negative Ears, nose, mouth, throat, and face: negative Respiratory: negative Cardiovascular: negative Gastrointestinal: negative Genitourinary:negative Integument/breast: negative Hematologic/lymphatic: negative Musculoskeletal:negative Neurological: negative Behavioral/Psych: negative Endocrine: negative Allergic/Immunologic: negative  Physical Exam  BP (!) 147/86   Pulse 75   Temp 97.9 F (36.6 C)  (Oral)   Resp 18   Ht 5\' 7"  (1.702 m)   Wt 107.8 kg   LMP 01/06/2020 (Approximate)   BMI 37.22 kg/m   Lungs:  CTA B Cardio: RRR without M/R/G Abd: Soft, gravid, NT Presentation: cephalic EXT: No C/C/ 1+ Edema DTRs: 2+ B CERVIX: Dilation: 1.5 Effacement (%): 50 Station: -3 Exam by:: [email or web address], RN  See Prenatal records for more detailed PE.     FHR:  Baseline: 120 bpm, Variability: Good {> 6 bpm), Accelerations: Reactive, and Decelerations: Absent  Toco: Uterine Contractions: Frequency: Every 3-4 minutes, Duration: 60-80 seconds, and Intensity: mild  Test Results  Results for orders placed or performed during the hospital encounter of 10/04/20 (from the past 24 hour(s))  Type and screen     Status: None (Preliminary result)   Collection Time: 10/04/20  5:46 AM  Result Value Ref Range   ABO/RH(D) PENDING    Antibody Screen PENDING    Sample Expiration      10/07/2020,2359 Performed at [facility], [address] [place], [place]   CBC     Status: Abnormal   Collection Time: 10/04/20  5:46 AM  Result Value Ref Range   WBC 10.1 4.0 - 10.5 K/uL   RBC 3.88 3.87 - 5.11 MIL/uL   Hemoglobin 11.4 (L) 12.0 - 15.0 g/dL   HCT 12/04/20 (L) 51.7 - 00.1 %   MCV 87.9 80.0 - 100.0 fL   MCH 29.4 26.0 - 34.0 pg   MCHC 33.4 30.0 - 36.0 g/dL   RDW 74.9 44.9 - 67.5 %   Platelets 286 150 - 400 K/uL   nRBC 0.0 0.0 - 0.2 %  Comprehensive metabolic panel     Status: Abnormal   Collection Time: 10/04/20  5:46 AM  Result Value Ref Range   Sodium 136 135 - 145 mmol/L   Potassium 3.5 3.5 - 5.1 mmol/L   Chloride 105 98 - 111 mmol/L   CO2 20 (L) 22 - 32 mmol/L   Glucose, Bld 108 (H) 70 - 99 mg/dL   BUN 9 6 - 20 mg/dL   Creatinine, Ser 12/04/20 0.44 - 1.00 mg/dL   Calcium 9.1 8.9 - 3.84 mg/dL   Total Protein 6.2 (L) 6.5 - 8.1 g/dL   Albumin 2.7 (L) 3.5 - 5.0 g/dL   AST 21 15 - 41 U/L   ALT 16 0 - 44 U/L   Alkaline Phosphatase 133 (H) 38 - 126 U/L   Total Bilirubin  0.7 0.3 - 1.2 mg/dL   GFR, Estimated 66.5 >99 mL/min   Anion gap 11 5 - 15  Protein / creatinine ratio, urine     Status: None   Collection Time: 10/04/20  5:46 AM  Result Value Ref Range   Creatinine,  Urine 154 mg/dL   Total Protein, Urine 22 mg/dL   Protein Creatinine Ratio 0.14 0.00 - 0.15 mg/mg[Cre]   Group B Strep positive  Assessment   [id number] at [redacted]w[redacted]d Estimated Date of Delivery: 10/20/20  The fetus is reassuring.   Patient Active Problem List   Diagnosis Date Noted   Labor and delivery, indication for care 10/04/2020   History of preterm delivery, currently pregnant in second trimester 05/08/2020   Chronic hypertension affecting pregnancy 05/08/2020   History of preterm premature rupture of membranes (PROM) in previous pregnancy, currently pregnant in second trimester 05/08/2020   Supervision of high risk pregnancy, antepartum 04/08/2020   DDD (degenerative disc disease), lumbar 11/30/2019   History of migraine headaches 11/30/2019   Essential hypertension 09/05/2019   Sleep disturbance 09/05/2019   Obesity 02/27/2019   Vitamin D deficiency 11/02/2016    Plan  1. Admitted to L&D :   2. EFM:-- Category 1 3. Stadol or Epidural if desired.   4. Admission labs completed 5. Cooks catheter placed 6.Dr. Valentino Saxon aware of admission and plan of care  Doreene Burke, [facility]  10/04/2020 7:56 AM

## 2020-10-04 NOTE — Progress Notes (Signed)
LABOR NOTE   Sydney James 25 y.o.GP@ at [redacted]w[redacted]d  SUBJECTIVE:  Uncomfortable with back pain during contractions Analgesia: Labor support without medications, requesting epidural .   OBJECTIVE:  BP (!) 151/93   Pulse 71   Temp 98.1 F (36.7 C) (Oral)   Resp 16   Ht 5\' 7"  (1.702 m)   Wt 107.8 kg   LMP 01/06/2020 (Approximate)   BMI 37.22 kg/m  Total I/O In: 10 [I.V.:10] Out: -   She has shown cervical change. CERVIX: 5cm:  60%:   -2:   posterior:   soft SVE:   Dilation: 5 Effacement (%): 60 Station: -3, -2 Exam by:: A Druscilla Petsch CNM CONTRACTIONS: regular, every 2-4 minutes FHR: Fetal heart tracing reviewed. Baseline: 120 bpm, Variability: Good {> 6 bpm), Accelerations: Reactive, and Decelerations: Absent Category I    Labs: Lab Results  Component Value Date   WBC 10.1 10/04/2020   HGB 11.4 (L) 10/04/2020   HCT 34.1 (L) 10/04/2020   MCV 87.9 10/04/2020   PLT 286 10/04/2020    ASSESSMENT: 1) Labor curve reviewed.       Progress: early labor     Membranes: ruptured, clear fluid            Active Problems:   Labor and delivery, indication for care Chronic hypertension TOLAC  PLAN: continue present management   12/04/2020, CNM  10/04/2020 11:41 AM

## 2020-10-04 NOTE — Progress Notes (Signed)
LABOR NOTE   Sydney James 24 y.o.GP@ at [redacted]w[redacted]d  SUBJECTIVE:  Pt feeling some mild pressure with contractions Analgesia: Epidural  OBJECTIVE:  BP (!) 155/90   Pulse 79   Temp 97.9 F (36.6 C) (Axillary)   Resp 14   Ht 5\' 7"  (1.702 m)   Wt 107.8 kg   LMP 01/06/2020 (Approximate)   SpO2 99%   BMI 37.22 kg/m  No intake/output data recorded.  She has shown cervical change. CERVIX: 8 cm:  90%:   -2:   mid position:   soft SVE:   Dilation: 8 Effacement (%): 80 Station: -2, -1 Exam by:: [email or web address], CNM CONTRACTIONS: regular, every 2-3 minutes FHR: Fetal heart tracing reviewed. Baseline: 120 bpm, Variability: Good {> 6 bpm), Accelerations: Reactive, and Decelerations: Variable: moderate Category II    Labs: Lab Results  Component Value Date   WBC 10.1 10/04/2020   HGB 11.4 (L) 10/04/2020   HCT 34.1 (L) 10/04/2020   MCV 87.9 10/04/2020   PLT 286 10/04/2020    ASSESSMENT: 1) Labor curve reviewed.       Progress: Active phase labor.     Membranes: ruptured, clear fluid, IUPC            Active Problems:   Labor and delivery, indication for care Chronic hypertension GBS positive TOLAC  PLAN: IV fluid bolus, change maternal position, reduce stimulation (IV Pitocin), and amnioinfusion   12/04/2020, CNM  10/04/2020 7:36 PM

## 2020-10-04 NOTE — Anesthesia Preprocedure Evaluation (Addendum)
Anesthesia Evaluation  Patient identified by MRN, date of birth, ID band Patient awake    Reviewed: Allergy & Precautions, H&P , NPO status , Patient's Chart, lab work & pertinent test results  Airway Mallampati: II       Dental  (+) Teeth Intact   Pulmonary neg pulmonary ROS,    breath sounds clear to auscultation       Cardiovascular Exercise Tolerance: Good hypertension, (-) angina(-) Cardiac Stents  Rhythm:regular Rate:Normal     Neuro/Psych negative neurological ROS  negative psych ROS   GI/Hepatic negative GI ROS, Neg liver ROS,   Endo/Other  negative endocrine ROS  Renal/GU      Musculoskeletal  (+) Arthritis ,   Abdominal (+) + obese,   Peds  Hematology negative hematology ROS (+)   Anesthesia Other Findings VBAC  Past Medical History: No date: Lumbar herniated disc No date: Migraine No date: Preeclampsia No date: PVC's (premature ventricular contractions) No date: Raynaud's disease No date: Scoliosis     Comment:  Back brace for 2 years No date: Scoliosis  Past Surgical History: 12/22/2017: CESAREAN SECTION; N/A     Comment:  Procedure: CESAREAN SECTION;  Surgeon: Linzie Collin, MD;  Location: [facility] ORS;  Service: Obstetrics;                Laterality: N/A;  Female Born @ [phone]   No date: DILATION AND CURETTAGE OF UTERUS 03/15/2016: DILATION AND EVACUATION; N/A     Comment:  Procedure: DILATATION AND EVACUATION;  Surgeon: Herold Harms, MD;  Location: [facility] ORS;  Service:               Gynecology;  Laterality: N/A; No date: WISDOM TOOTH EXTRACTION     Reproductive/Obstetrics negative OB ROS                            Anesthesia Physical  Anesthesia Plan  ASA: II  Anesthesia Plan: Epidural   Post-op Pain Management:    Induction:   PONV Risk Score and Plan:   Airway Management Planned:   Additional Equipment:    Intra-op Plan:   Post-operative Plan:   Informed Consent: I have reviewed the patients History and Physical, chart, labs and discussed the procedure including the risks, benefits and alternatives for the proposed anesthesia with the patient or authorized representative who has indicated his/her understanding and acceptance.     Dental Advisory Given  Plan Discussed with: Anesthesiologist, CRNA and Surgeon  Anesthesia Plan Comments: (Consented for emergent CS with GA as backup and regional block)        Anesthesia Quick Evaluation

## 2020-10-05 LAB — BPAM RBC
Blood Product Expiration Date: 202210312359
Blood Product Expiration Date: 202211022359
Blood Product Expiration Date: 202211032359
ISSUE DATE / TIME: 202210030908
Unit Type and Rh: 5100
Unit Type and Rh: 5100
Unit Type and Rh: 5100

## 2020-10-05 LAB — TYPE AND SCREEN
ABO/RH(D): O POS
Antibody Screen: POSITIVE
Unit division: 0
Unit division: 0
Unit division: 0

## 2020-10-05 LAB — CBC
HCT: 31.5 % — ABNORMAL LOW (ref 36.0–46.0)
Hemoglobin: 10.6 g/dL — ABNORMAL LOW (ref 12.0–15.0)
MCH: 29.9 pg (ref 26.0–34.0)
MCHC: 33.7 g/dL (ref 30.0–36.0)
MCV: 89 fL (ref 80.0–100.0)
Platelets: 239 10*3/uL (ref 150–400)
RBC: 3.54 MIL/uL — ABNORMAL LOW (ref 3.87–5.11)
RDW: 13.9 % (ref 11.5–15.5)
WBC: 16.7 10*3/uL — ABNORMAL HIGH (ref 4.0–10.5)
nRBC: 0 % (ref 0.0–0.2)

## 2020-10-05 MED ORDER — NIFEDIPINE ER OSMOTIC RELEASE 30 MG PO TB24
30.0000 mg | ORAL_TABLET | Freq: Every day | ORAL | Status: DC
  Administered 2020-10-05: 30 mg via ORAL
  Filled 2020-10-05: qty 1

## 2020-10-05 NOTE — Lactation Note (Signed)
This note was copied from a baby's chart. Lactation Consultation Note  Patient Name: Sydney James [id number] Date: 10/05/2020 Reason for consult: Initial assessment;Early term 37-38.6wks Age:27 hours  Initial lactation visit. Mom is P3, SVD 14 hours ago. Previous children were born 4 and 6 weeks early, staying in SCN for weeks. Mom's breastfeeding experience includes exclusive pumping for 9 months with the first and 4 months with the second.   Mom desires to breast and pump/bottle feed with this baby and has been doing both every 2 hours. Mom reports nipples being sore and uncomfortable. Mom's L nipple appears to have a compression stripe through the middle indicating a shallow latch/nipple feeding. Overnight mom was given a nipple shield, though it did not take away the discomfort. Nipple shield was size 61mm, appeared an appropriate size, and may be beneficial based on the appearance of mom's nipples. LC and mom discussed the position and latch of the infant, deep versus shallow latch, and the need for cheeks and nose to be pushed into the breast tissue- mom reports pulling back on the tissue so baby can breathe- this can lead to baby slipping to tip of the nipple creating the pain/discomfort/damage.   Encouraged to let baby feed with his cues and not the clock, and also adjusting the pumping schedule to meet baby's feedings schedule- this can allow for more rest and healing time of the nipples. Size 64mm were determined to be an appropriate fit, and signs to look for that they were too tight. Silverettes discussed as a helpful option to promote healing of the damaged nipples and prevent further damage.   Mom was encouraged to call out with next feeding attempt for lactation support.   Maternal Data Has patient been taught Hand Expression?: Yes Does the patient have breastfeeding experience prior to this delivery?: Yes How long did the patient breastfeed?: 9 months, 4  months  Feeding Mother's Current Feeding Choice: Breast Milk Nipple Type: Slow - flow  LATCH Score                    Lactation Tools Discussed/Used Tools: Pump;Coconut oil;Nipple Shields Nipple shield size: 20 Breast pump type: Double-Electric Breast Pump Reason for Pumping: Mom's choice Pumping frequency: q 2 hrs  Interventions Interventions: Breast feeding basics reviewed;Hand express;Coconut oil;Shells;Education;DEBP  Discharge Pump: Personal  Consult Status Consult Status: Follow-up Date: 10/05/20 Follow-up type: In-patient    Danford Bad 10/05/2020, 10:19 AM

## 2020-10-05 NOTE — Progress Notes (Addendum)
Progress Note - Vaginal Delivery  Sydney James is a 25 y.o. [phone] now PP day 1 s/p Vaginal, Spontaneous , VBAC.   Subjective:  The patient reports no complaints, up ad lib, voiding, and tolerating PO   Objective:  Vital signs in last 24 hours: Temp:  [97.7 F (36.5 C)-98.2 F (36.8 C)] 97.8 F (36.6 C) (10/03 0348) Pulse Rate:  [66-92] 82 (10/03 0419) Resp:  [14-20] 20 (10/03 0419) BP: (118-163)/(66-95) 141/94 (10/03 0419) SpO2:  [95 %-100 %] 98 % (10/03 0419)  Physical Exam:  General: alert, cooperative, appears stated age, and fatigued Lochia: appropriate Uterine Fundus: firm    Data Review Recent Labs    10/04/20 0546 10/05/20 0532  HGB 11.4* 10.6*  HCT 34.1* 31.5*    Assessment/Plan: Active Problems:   Labor and delivery, indication for care   Plan for discharge tomorrow  Consulted Dr.Evan , start procardia XL 30 mg daily   -- Continue routine PP care.     Doreene Burke, CNM  10/05/2020 7:47 AM

## 2020-10-05 NOTE — Anesthesia Postprocedure Evaluation (Signed)
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   Last Vitals:  Vitals:   10/05/20 0419 10/05/20 0805  BP: (!) 141/94 (!) 134/95  Pulse: 82 84  Resp: 20 18  Temp:  36.5 C  SpO2: 98% 98%    Last Pain:  Vitals:   10/05/20 0805  TempSrc: Oral  PainSc:                  Lynden Oxford

## 2020-10-05 NOTE — Plan of Care (Signed)
Transferred to Room 349 PP. Alert and oriented with appropriate affect. Color good, skin w&d. Assessment and VS WNL. Oriented to Room, Safety and Security and [employer]. V/O.

## 2020-10-06 MED ORDER — NIFEDIPINE ER OSMOTIC RELEASE 30 MG PO TB24
60.0000 mg | ORAL_TABLET | Freq: Every day | ORAL | Status: DC
  Administered 2020-10-06: 60 mg via ORAL
  Filled 2020-10-06: qty 2

## 2020-10-06 MED ORDER — IBUPROFEN 600 MG PO TABS: 600.0000 mg | ORAL_TABLET | Freq: Four times a day (QID) | ORAL | 0 refills | Status: DC

## 2020-10-06 MED ORDER — NIFEDIPINE ER 60 MG PO TB24: 60.0000 mg | ORAL_TABLET | Freq: Every day | ORAL | 5 refills | Status: DC

## 2020-10-06 NOTE — Final Progress Note (Signed)
Discharge Day SOAP Note:  Progress Note - Vaginal Delivery  Sydney James is a 25 y.o. [phone] now PP day 2 s/p Vaginal, Spontaneous . Delivery was uncomplicated  Subjective  The patient has the following complaints: has no unusual complaints  Pain is controlled with current medications.   Patient is urinating without difficulty.  She is ambulating well.     Objective  Vital signs: BP (!) 147/86 (BP Location: Right Arm)   Pulse 91   Temp 98.3 F (36.8 C) (Oral)   Resp (!) 0   Ht 5\' 7"  (1.702 m)   Wt 107.8 kg   LMP 01/06/2020 (Approximate)   SpO2 98%   Breastfeeding Unknown   BMI 37.22 kg/m   Physical Exam: Gen: NAD Fundus Fundal Tone: Firm Lochia Amount: Scant      Data Review Labs: Lab Results Component Value Date  WBC 16.7 (H) 10/05/2020  HGB 10.6 (L) 10/05/2020  HCT 31.5 (L) 10/05/2020  MCV 89.0 10/05/2020  PLT 239 10/05/2020  CBC Latest Ref Rng & Units 10/05/2020 10/04/2020 09/22/2020 WBC 4.0 - 10.5 K/uL 16.7(H) 10.1 11.8(H) Hemoglobin 12.0 - 15.0 g/dL 10.6(L) 11.4(L) 11.9 Hematocrit 36.0 - 46.0 % 31.5(L) 34.1(L) 35.7 Platelets 150 - 400 K/uL 239 286 275  O POS  Edinburgh Score: Edinburgh Postnatal Depression Scale Screening Tool 10/05/2020 I have been able to laugh and see the funny side of things. 0 I have looked forward with enjoyment to things. 0 I have blamed myself unnecessarily when things went wrong. 0 I have been anxious or worried for no good reason. 1 I have felt scared or panicky for no good reason. 1 Things have been getting on top of me. 0 I have been so unhappy that I have had difficulty sleeping. 0 I have felt sad or miserable. 0 I have been so unhappy that I have been crying. 0 The thought of harming myself has occurred to me. 0 Edinburgh Postnatal Depression Scale Total 2   Assessment/Plan  Active Problems:   Labor and delivery, indication for care    Plan for discharge today.  Discharge Instructions: Per After Visit  Summary. Activity: Advance as tolerated. Pelvic rest for 6 weeks.  Also refer to After Visit Summary Diet: Regular Medications: Allergies as of 10/06/2020      Reactions  Aloe Hives  Lubricants   Has to have water based lubricant. Allergy to non-water based lubricant  Monistat [miconazole]   Sulfa Antibiotics Itching, Rash  Tape Itching, Rash     Medication List   STOP taking these medications   aspirin EC 81 MG tablet  ondansetron 4 MG disintegrating tablet Commonly known as: ZOFRAN-ODT     TAKE these medications   ibuprofen 600 MG tablet Commonly known as: ADVIL Take 1 tablet (600 mg total) by mouth every 6 (six) hours.  multivitamin-prenatal 27-0.8 MG Tabs tablet Take 1 tablet by mouth daily at 12 noon.  NIFEdipine 60 MG 24 hr tablet Commonly known as: ADALAT CC Take 1 tablet (60 mg total) by mouth daily. Start taking on: October 07, 2020     Outpatient follow up: Friday for BP check , 2 wk televisit , 6 wk ppv in the office with Tuesday  Postpartum contraception: Husband getting Vasectomy   Discharged Condition: stable  Discharged to: home  Newborn Data: Disposition:home with mother  Apgars: APGAR (1 MIN): 7   APGAR (5 MINS): 9   APGAR (10 MINS):    Baby Feeding: Breast    Pattricia Boss, [employer]  10/06/2020 11:28 AM

## 2020-10-06 NOTE — Discharge Instructions (Signed)

## 2020-10-06 NOTE — Discharge Summary (Signed)
Patient Name: Sydney James DOB: 26-Sep-1995 MRN: [id number]                            Discharge Summary  Date of Admission: 10/04/2020 Date of Discharge: 10/06/2020 Delivering Provider: Doreene Burke   Admitting Diagnosis: Labor and delivery, indication for care [O75.9] at [redacted]w[redacted]d, Chronic Hypertension  Secondary diagnosis:  Active Problems:   Labor and delivery, indication for care Chronic hypertension  Mode of Delivery: normal spontaneous vaginal delivery              Discharge diagnosis: Term Pregnancy Delivered and CHTN      Intrapartum Procedures: Atificial rupture of membranes, epidural, GBS prophylaxis, pitocin augmentation, and placement of intrauterine catheter   Post partum procedures:  none  Pt started on procardia XL daily for BP management.   Complications: none                     Discharge Day SOAP Note:  Progress Note - Vaginal Delivery  Sydney James is a 25 y.o. [phone] now PP day 2 s/p Vaginal, Spontaneous . Delivery was uncomplicated  Subjective  The patient has the following complaints: has no unusual complaints  Pain is controlled with current medications.   Patient is urinating without difficulty.  She is ambulating well.     Objective  Vital signs: BP (!) 147/86 (BP Location: Right Arm)   Pulse 91   Temp 98.3 F (36.8 C) (Oral)   Resp (!) 0   Ht 5\' 7"  (1.702 m)   Wt 107.8 kg   LMP 01/06/2020 (Approximate)   SpO2 98%   Breastfeeding Unknown   BMI 37.22 kg/m   Physical Exam: Gen: NAD Fundus Fundal Tone: Firm  Lochia Amount: Scant        Data Review Labs: Lab Results  Component Value Date   WBC 16.7 (H) 10/05/2020   HGB 10.6 (L) 10/05/2020   HCT 31.5 (L) 10/05/2020   MCV 89.0 10/05/2020   PLT 239 10/05/2020   CBC Latest Ref Rng & Units 10/05/2020 10/04/2020 09/22/2020  WBC 4.0 - 10.5 K/uL 16.7(H) 10.1 11.8(H)  Hemoglobin 12.0 - 15.0 g/dL 10.6(L) 11.4(L) 11.9  Hematocrit 36.0 - 46.0 % 31.5(L) 34.1(L) 35.7   Platelets 150 - 400 K/uL 239 286 275   O POS  Edinburgh Score: Edinburgh Postnatal Depression Scale Screening Tool 10/05/2020  I have been able to laugh and see the funny side of things. 0  I have looked forward with enjoyment to things. 0  I have blamed myself unnecessarily when things went wrong. 0  I have been anxious or worried for no good reason. 1  I have felt scared or panicky for no good reason. 1  Things have been getting on top of me. 0  I have been so unhappy that I have had difficulty sleeping. 0  I have felt sad or miserable. 0  I have been so unhappy that I have been crying. 0  The thought of harming myself has occurred to me. 0  Edinburgh Postnatal Depression Scale Total 2    Assessment/Plan  Active Problems:   Labor and delivery, indication for care    Plan for discharge today.  Discharge Instructions: Per After Visit Summary. Activity: Advance as tolerated. Pelvic rest for 6 weeks.  Also refer to After Visit Summary Diet: Regular Medications: Allergies as of 10/06/2020       Reactions  Aloe Hives   Lubricants    Has to have water based lubricant. Allergy to non-water based lubricant   Monistat [miconazole]    Sulfa Antibiotics Itching, Rash   Tape Itching, Rash        Medication List     STOP taking these medications    aspirin EC 81 MG tablet   ondansetron 4 MG disintegrating tablet Commonly known as: ZOFRAN-ODT       TAKE these medications    ibuprofen 600 MG tablet Commonly known as: ADVIL Take 1 tablet (600 mg total) by mouth every 6 (six) hours.   multivitamin-prenatal 27-0.8 MG Tabs tablet Take 1 tablet by mouth daily at 12 noon.   NIFEdipine 60 MG 24 hr tablet Commonly known as: ADALAT CC Take 1 tablet (60 mg total) by mouth daily. Start taking on: October 07, 2020       Outpatient follow up: Friday for BP check , 2 wk televisit , 6 wk ppv in the office with Pattricia Boss  Postpartum contraception: Husband getting Vasectomy    Discharged Condition: stable  Discharged to: home  Newborn Data: Disposition:home with mother  Apgars: APGAR (1 MIN): 7   APGAR (5 MINS): 9   APGAR (10 MINS):    Baby Feeding: Breast    Doreene Burke, [facility]  10/06/2020 11:28 AM

## 2020-10-06 NOTE — Progress Notes (Signed)
Mother discharged.  Discharge instructions given.  Mother verbalizes understanding.  Transported by auxiliary.  

## 2020-10-06 NOTE — Lactation Note (Signed)
This note was copied from a baby's chart. Lactation Consultation Note  Patient Name: Sydney James [id number] Date: 10/06/2020 Reason for consult: Follow-up assessment;Early term 37-38.6wks Age:27 hours  Lactation follow-up prior to anticipated discharge. Mom has main complaint of nipple tenderness/soreness with feeding at the breast. Mom has continued to pump routinely, although output is very little. Mom was provided with reassurance that this is normal with pumping during colostrum phase but importance of continuation.  Parents report working to improve baby's latch at the breast, deeper with flanged lips, and no longer pulling back on the breast tissue  but mom remains in pain. LC attempted to perform an oral assessment on baby, however he was sleepy and not cooperative.   Mom has a plan to continue with pump to initiate the onset of and adequate milk production, and then work to resume baby at the breast with use of a nipple shield. LC looked at both nipples, L nipple appears to be healing (compression stripe), but notices that the middle of nipples appear to invert; mom confirms. This may be the cause of increased sensitivity with baby at the breast.  LC provided comfort gels, and shells and educated mom on the use/wear of both.  Outpatient lactation information given. Encouraged to continue to seek BF support as needed.  Maternal Data Has patient been taught Hand Expression?: Yes Does the patient have breastfeeding experience prior to this delivery?: Yes How long did the patient breastfeed?: 89mo, 15mo  Feeding Mother's Current Feeding Choice: Breast Milk  LATCH Score                    Lactation Tools Discussed/Used Tools: Shells;Pump;Coconut oil;Comfort gels;Nipple Dorris Carnes;Bottle Nipple shield size: 20 Breast pump type: Double-Electric Breast Pump Reason for Pumping: Mom's choice Pumping frequency: q 3hrs  Interventions Interventions: Comfort gels;Coconut  oil;Shells;DEBP;Education (nipple shield)  Discharge Discharge Education: Outpatient recommendation Pump: Personal  Consult Status Consult Status: Complete Date: 10/06/20 Follow-up type: Call as needed    Danford Bad 10/06/2020, 10:10 AM

## 2020-10-06 NOTE — Progress Notes (Signed)
Notified Doreene Burke, CNM of patients BP of 143/97.  Ok to discharge per Doreene Burke, CNM.

## 2020-10-07 ENCOUNTER — Telehealth: Payer: Self-pay | Admitting: Certified Nurse Midwife

## 2020-10-07 NOTE — Telephone Encounter (Signed)
Patient states that she has been having some blood pressure issues.  She took her medication this morning and her blood pressure has come down but it appears to be too low now.

## 2020-10-07 NOTE — Telephone Encounter (Signed)
Patient states BP was 149/88 when waking up this AM, took meds. Started feeling shaky, lightheaded, dizzy, having headaches around lunchtime. Pt said she took BP 15 mins ago while up and moving and it was 111/61. Advised patient per Pattricia Boss to hold off on meds til next appointment this Friday for f/u.

## 2020-10-09 ENCOUNTER — Encounter: Payer: Self-pay | Admitting: Certified Nurse Midwife

## 2020-10-09 ENCOUNTER — Other Ambulatory Visit: Payer: Self-pay

## 2020-10-09 ENCOUNTER — Ambulatory Visit (INDEPENDENT_AMBULATORY_CARE_PROVIDER_SITE_OTHER): Admitting: Certified Nurse Midwife

## 2020-10-09 VITALS — BP 142/93 | HR 94 | Ht 67.0 in | Wt 222.1 lb

## 2020-10-09 DIAGNOSIS — Z0130 Encounter for examination of blood pressure without abnormal findings: Secondary | ICD-10-CM

## 2020-10-09 MED ORDER — LABETALOL HCL 100 MG PO TABS: 200.0000 mg | ORAL_TABLET | Freq: Two times a day (BID) | ORAL | 5 refills | Status: DC

## 2020-10-09 NOTE — Progress Notes (Signed)
Pt present today for BP check. She stopped procardia due to BP getting to low and not feeling well at home. Dr. Logan Bores consulted. Changed to labetalol 200 mg BID. Pt will return in 1 wk.   Doreene Burke, CNM

## 2020-10-16 NOTE — Progress Notes (Signed)
Sydney James presents for  BP check. She is 15 days PP.  She is compliant/noncompliant with her BP med Labetalol 100 mg take 2 tablets twice daily.  BP today 125/54.  Pt to continue current medication.

## 2020-10-19 ENCOUNTER — Encounter: Payer: Self-pay | Admitting: Certified Nurse Midwife

## 2020-10-19 ENCOUNTER — Ambulatory Visit (INDEPENDENT_AMBULATORY_CARE_PROVIDER_SITE_OTHER): Admitting: Certified Nurse Midwife

## 2020-10-19 ENCOUNTER — Other Ambulatory Visit: Payer: Self-pay

## 2020-10-19 VITALS — BP 125/54 | HR 81 | Ht 67.0 in | Wt 215.2 lb

## 2020-10-19 DIAGNOSIS — Z0130 Encounter for examination of blood pressure without abnormal findings: Secondary | ICD-10-CM

## 2020-10-19 MED ORDER — LABETALOL HCL 100 MG PO TABS: 100.0000 mg | ORAL_TABLET | Freq: Two times a day (BID) | ORAL | 5 refills | Status: DC

## 2020-10-19 NOTE — Patient Instructions (Signed)
How to Take Your Blood Pressure ?Blood pressure measures how strongly your blood is pressing against the walls of your arteries. Arteries are blood vessels that carry blood from your heart throughout your body. You can take your blood pressure at home with a machine. ?You may need to check your blood pressure at home: ?To check if you have high blood pressure (hypertension). ?To check your blood pressure over time. ?To make sure your blood pressure medicine is working. ?Supplies needed: ?Blood pressure machine, or monitor. ?Dining room chair to sit in. ?Table or desk. ?Small notebook. ?Pencil or pen. ?How to prepare ?Avoid these things for 30 minutes before checking your blood pressure: ?Having drinks with caffeine in them, such as coffee or tea. ?Drinking alcohol. ?Eating. ?Smoking. ?Exercising. ?Do these things five minutes before checking your blood pressure: ?Go to the bathroom and pee (urinate). ?Sit in a dining chair. Do not sit on a soft couch or an armchair. ?Be quiet. Do not talk. ?How to take your blood pressure ?Follow the instructions that came with your machine. If you have a digital blood pressure monitor, these may be the instructions: ?Sit up straight. ?Place your feet on the floor. Do not cross your ankles or legs. ?Rest your left arm at the level of your heart. You may rest it on a table, desk, or chair. ?Pull up your shirt sleeve. ?Wrap the blood pressure cuff around the upper part of your left arm. The cuff should be 1 inch (2.5 cm) above your elbow. It is best to wrap the cuff around bare skin. ?Fit the cuff snugly around your arm. You should be able to place only one finger between the cuff and your arm. ?Place the cord so that it rests in the bend of your elbow. ?Press the power button. ?Sit quietly while the cuff fills with air and loses air. ?Write down the numbers on the screen. ?Wait 2-3 minutes and then repeat steps 1-10. ?What do the numbers mean? ?Two numbers make up your blood  pressure. The first number is called systolic pressure. The second is called diastolic pressure. An example of a blood pressure reading is "120 over 80" (or 120/80). ?If you are an adult and do not have a medical condition, use this guide to find out if your blood pressure is normal: ?Normal ?First number: below 120. ?Second number: below 80. ?Elevated ?First number: 120-129. ?Second number: below 80. ?Hypertension stage 1 ?First number: 130-139. ?Second number: 80-89. ?Hypertension stage 2 ?First number: 140 or above. ?Second number: 90 or above. ?Your blood pressure is above normal even if only the first or only the second number is above normal. ?Follow these instructions at home: ?Medicines ?Take over-the-counter and prescription medicines only as told by your doctor. ?Tell your doctor if your medicine is causing side effects. ?General instructions ?Check your blood pressure as often as your doctor tells you to. ?Check your blood pressure at the same time every day. ?Take your monitor to your next doctor's appointment. Your doctor will: ?Make sure you are using it correctly. ?Make sure it is working right. ?Understand what your blood pressure numbers should be. ?Keep all follow-up visits as told by your doctor. This is important. ?General tips ?You will need a blood pressure machine, or monitor. Your doctor can suggest a monitor. You can buy one at a drugstore or online. When choosing one: ?Choose one with an arm cuff. ?Choose one that wraps around your upper arm. Only one finger should fit between   your arm and the cuff. ?Do not choose one that measures your blood pressure from your wrist or finger. ?Where to find more information ?American Heart Association: www.heart.org ?Contact a doctor if: ?Your blood pressure keeps being high. ?Your blood pressure is suddenly low. ?Get help right away if: ?Your first blood pressure number is higher than 180. ?Your second blood pressure number is higher than  120. ?Summary ?Check your blood pressure at the same time every day. ?Avoid caffeine, alcohol, smoking, and exercise for 30 minutes before checking your blood pressure. ?Make sure you understand what your blood pressure numbers should be. ?This information is not intended to replace advice given to you by your health care provider. Make sure you discuss any questions you have with your health care provider. ?Document Revised: 10/30/2019 Document Reviewed: 12/14/2018 ?Elsevier Patient Education ? 2022 [employer] ? ?

## 2020-10-19 NOTE — Progress Notes (Signed)
BP check due to change in medication. Pt states that she feels fine and denies dizziness or light headedness. BP today a little low 125/54. Discussed going to 100 mg BID. She is in agreement. Will do x 1 wk and reach out to me via my chart in one week with BP readings.   Doreene Burke, CNM

## 2020-10-23 ENCOUNTER — Encounter: Payer: Self-pay | Admitting: Certified Nurse Midwife

## 2020-10-23 ENCOUNTER — Telehealth (INDEPENDENT_AMBULATORY_CARE_PROVIDER_SITE_OTHER): Admitting: Certified Nurse Midwife

## 2020-10-23 NOTE — Progress Notes (Signed)
Virtual Visit via Video Note  I connected with Sydney James on 10/23/20 at 11:30 AM EDT by a video enabled telemedicine application and verified that I am speaking with the correct person using two identifiers.  Location: Patient: at home Provider: at office   I discussed the limitations of evaluation and management by telemedicine and the availability of in person appointments. The patient expressed understanding and agreed to proceed.  History of Present Illness: SVD 10/04/20 [id number], chronic hypertension on meds    Observations/Objective: Doing well, have been titrating BP medication . She is currently taking labetalol 100 mg bid. Her pressures have been upper 120's-130s over upper 70-80. States she is feeling much better and her headaches have resolved.   Her bleeding has resolved to spotting with whiping, She denies pain. She is nursing well.   PHQ9 SCORE ONLY 10/23/2020 04/08/2020 02/17/2020  PHQ-9 Total Score 8 0 0   GAD 7 : Generalized Anxiety Score 10/23/2020 09/05/2019  Nervous, Anxious, on Edge 2 1  Control/stop worrying 1 1  Worry too much - different things 2 1  Trouble relaxing 2 2  Restless 2 1  Easily annoyed or irritable 2 3  Afraid - awful might happen 0 0  Total GAD 7 Score 11 9  Anxiety Difficulty - Not difficult at all      Assessment and Plan: She is expressing some anxiety because her husband is returning to work so she will have the children by herself. She declines need for medication at this time. Feels like it will be better once she establishes a routine.  Continue labetalol  mg bid.  Follow Up Instructions: As scheduled for 6 wk visit.     I discussed the assessment and treatment plan with the patient. The patient was provided an opportunity to ask questions and all were answered. The patient agreed with the plan and demonstrated an understanding of the instructions.   The patient was advised to call back or seek an in-person evaluation if the  symptoms worsen or if the condition fails to improve as anticipated.  I provided 7 minutes of non-face-to-face time during this encounter.   Doreene Burke, CNM

## 2020-10-27 ENCOUNTER — Other Ambulatory Visit: Payer: Self-pay | Admitting: Certified Nurse Midwife

## 2020-10-27 MED ORDER — SERTRALINE HCL 50 MG PO TABS: 50.0000 mg | ORAL_TABLET | Freq: Every day | ORAL | 1 refills | Status: DC

## 2020-10-29 ENCOUNTER — Telehealth: Payer: Self-pay | Admitting: Lactation Services

## 2020-10-29 NOTE — Telephone Encounter (Signed)
Error- meant to chart under baby.

## 2020-11-17 ENCOUNTER — Ambulatory Visit (INDEPENDENT_AMBULATORY_CARE_PROVIDER_SITE_OTHER): Admitting: Certified Nurse Midwife

## 2020-11-17 ENCOUNTER — Other Ambulatory Visit: Payer: Self-pay

## 2020-11-17 ENCOUNTER — Encounter: Payer: Self-pay | Admitting: Certified Nurse Midwife

## 2020-11-17 NOTE — Progress Notes (Signed)
Subjective:    Sydney James is a 25 y.o. [phone] Caucasian female who presents for a postpartum visit. She is 6 weeks postpartum following a spontaneous vaginal delivery at 37.5 gestational weeks. Anesthesia: epidural. I have fully reviewed the prenatal and intrapartum course. Postpartum course has been normal. Baby's course has been normal. Baby is feeding by breast. Bleeding no bleeding. Bowel function is normal. Bladder function is normal. Patient is not sexually active. : . Contraception method is vasectomy. Postpartum depression screening: negative. Score 12. Pt states she has to stop medication due to infant stomach upset with nursing. State she is doing ok right now and declines alternative medications.  Last pap 04/08/2020 and was negative. She is no longer taking the labetalol or the Zoloft.   The following portions of the patient's history were reviewed and updated as appropriate: allergies, current medications, past medical history, past surgical history and problem list.  Review of Systems Pertinent items are noted in HPI.   Vitals:   11/17/20 1048  BP: 130/87  Pulse: 76  Weight: 206 lb 12.8 oz (93.8 kg)  Height: 5\' 7"  (1.702 m)   No LMP recorded.  Objective:   General:  alert, cooperative and no distress   Breasts:  deferred, no complaints  Lungs: clear to auscultation bilaterally  Heart:  regular rate and rhythm  Abdomen: soft, nontender   Vulva: normal  Vagina: normal vagina  Cervix:  closed  Corpus: Well-involuted  Adnexa:  Non-palpable  Rectal Exam: no hemorrhoids        Assessment:   Postpartum exam 6 wks s/p SVD Breast feeding Depression screening Contraception counseling   Plan:  : vasectomy Follow up in: 1  year  for annual or earlier if needed  ,  CNM

## 2020-12-09 ENCOUNTER — Other Ambulatory Visit: Payer: Self-pay | Admitting: Neurosurgery

## 2020-12-09 DIAGNOSIS — M5430 Sciatica, unspecified side: Secondary | ICD-10-CM

## 2020-12-09 DIAGNOSIS — M5136 Other intervertebral disc degeneration, lumbar region: Secondary | ICD-10-CM

## 2020-12-09 DIAGNOSIS — M549 Dorsalgia, unspecified: Secondary | ICD-10-CM

## 2020-12-09 DIAGNOSIS — M5416 Radiculopathy, lumbar region: Secondary | ICD-10-CM

## 2020-12-09 DIAGNOSIS — M419 Scoliosis, unspecified: Secondary | ICD-10-CM

## 2020-12-22 ENCOUNTER — Other Ambulatory Visit

## 2020-12-23 ENCOUNTER — Ambulatory Visit

## 2020-12-24 LAB — SPECIMEN STATUS REPORT

## 2021-01-03 NOTE — L&D Delivery Note (Signed)
Delivery Note   Adahlia Stembridge is a 26 y.o. [id number] at [redacted]w[redacted]d Estimated Date of Delivery: 11/27/21  PRE-OPERATIVE DIAGNOSIS:  1) [redacted]w[redacted]d pregnancy.  IUFD at [redacted]w[redacted]d POST-OPERATIVE DIAGNOSIS:  1) [redacted]w[redacted]d pregnancy s/p Vaginal, Spontaneous    Delivery Type: Vaginal, Spontaneous    Delivery Anesthesia: None   Labor Complications:  None    ESTIMATED BLOOD LOSS: 50 ml    FINDINGS:   1) female infant, Apgar scores of   0 at 1 minute and   0 at 5 minutes and a birthweight of   ounces. Appears approximately 14-16 weeks of gestation.    SPECIMENS:   PLACENTA:   Appearance:  intact   Removal: Spontaneous      Disposition:  pathology  CORD BLOOD: Not Indicated  DISPOSITION:  Infant left with Henderson Newcomer and Clifton Custard for bonding  NARRATIVE SUMMARY: Labor course:  Ottis Sarnowski is a [id number] at [redacted]w[redacted]d who presented to Labor & Delivery for induction of labor for fetal demise. US showed IUFD at [redacted]w[redacted]d. Her initial cervical exam was closed/thick/high. Labor proceeded with 2 doses of misoprostol. She began to feel pressure and spontaneously birthed a non-viable female fetus at 91.  The cord was doubly clamped and cut by the father. The placenta delivered spontaneously at [facility] and was noted to be intact. A perineal and vaginal examination was performed. Episiotomy/Lacerations:  None  The patient tolerated this well. Annette was left in stable condition  Guadlupe Spanish, CNM 07/14/2021 3:24 AM

## 2021-01-03 NOTE — L&D Delivery Note (Signed)
error 

## 2021-01-07 ENCOUNTER — Other Ambulatory Visit: Payer: Self-pay

## 2021-01-07 ENCOUNTER — Ambulatory Visit
Admission: RE | Admit: 2021-01-07 | Discharge: 2021-01-07 | Disposition: A | Discharge status: Home / Self Care | Source: Ambulatory Visit | Attending: Neurosurgery | Admitting: Neurosurgery

## 2021-01-07 DIAGNOSIS — M5416 Radiculopathy, lumbar region: Secondary | ICD-10-CM | POA: Insufficient documentation

## 2021-01-07 DIAGNOSIS — M5136 Other intervertebral disc degeneration, lumbar region: Secondary | ICD-10-CM | POA: Insufficient documentation

## 2021-01-07 DIAGNOSIS — M5430 Sciatica, unspecified side: Secondary | ICD-10-CM | POA: Insufficient documentation

## 2021-01-07 DIAGNOSIS — M549 Dorsalgia, unspecified: Secondary | ICD-10-CM

## 2021-01-07 DIAGNOSIS — M419 Scoliosis, unspecified: Secondary | ICD-10-CM

## 2021-03-19 LAB — FETAL NONSTRESS TEST

## 2021-04-13 ENCOUNTER — Encounter: Payer: Self-pay | Admitting: Certified Nurse Midwife

## 2021-04-13 ENCOUNTER — Other Ambulatory Visit (HOSPITAL_COMMUNITY)
Admission: RE | Admit: 2021-04-13 | Discharge: 2021-04-13 | Disposition: A | Discharge status: Home / Self Care | Source: Ambulatory Visit | Attending: Certified Nurse Midwife | Admitting: Certified Nurse Midwife

## 2021-04-13 ENCOUNTER — Ambulatory Visit (INDEPENDENT_AMBULATORY_CARE_PROVIDER_SITE_OTHER): Admitting: Certified Nurse Midwife

## 2021-04-13 VITALS — BP 129/89 | HR 90 | Ht 67.0 in | Wt 202.2 lb

## 2021-04-13 DIAGNOSIS — N898 Other specified noninflammatory disorders of vagina: Secondary | ICD-10-CM

## 2021-04-13 DIAGNOSIS — Z3200 Encounter for pregnancy test, result unknown: Secondary | ICD-10-CM | POA: Diagnosis not present

## 2021-04-13 LAB — POCT URINE PREGNANCY: Preg Test, Ur: POSITIVE — AB

## 2021-04-13 NOTE — Addendum Note (Signed)
Addended by: Mechele Claude on: 04/13/2021 09:19 AM ? ? Modules accepted: Orders ? ?

## 2021-04-13 NOTE — Patient Instructions (Signed)
Prenatal Care ?Prenatal care is health care during pregnancy. It helps you and your unborn baby (fetus) stay as healthy as possible. Prenatal care may be provided by a midwife, a family practice doctor, a mid-level practitioner (nurse practitioner or physician assistant), or a childbirth and pregnancy doctor (obstetrician). ?How does this affect me? ?During pregnancy, you will be closely monitored for any new conditions that might develop. To lower your risk of pregnancy complications, you and your health care provider will talk about any underlying conditions you have. ?How does this affect my baby? ?Early and consistent prenatal care increases the chance that your baby will be healthy during pregnancy. Prenatal care lowers the risk that your baby will be: ?Born early (prematurely). ?Smaller than expected at birth (small for gestational age). ?What can I expect at the first prenatal care visit? ?Your first prenatal care visit will likely be the longest. You should schedule your first prenatal care visit as soon as you know that you are pregnant. Your first visit is a good time to talk about any questions or concerns you have about pregnancy. ?Medical history ?At your visit, you and your health care provider will talk about your medical history, including: ?Any past pregnancies. ?Your family's medical history. ?Medical history of the baby's father. ?Any long-term (chronic) health conditions you have and how you manage them. ?Any surgeries or procedures you have had. ?Any current over-the-counter or prescription medicines, herbs, or supplements that you are taking. ?Other factors that could pose a risk to your baby, including: ?Exposure to harmful chemicals or radiation at work or at home. ?Any substance use, including tobacco, alcohol, and drug use. ?Your home setting and your stress levels, including: ?Exposure to abuse or violence. ?Household financial strain. ?Your daily health habits, including diet and  exercise. ?Tests and screenings ?Your health care provider will: ?Measure your weight, height, and blood pressure. ?Do a physical exam, including a pelvic and breast exam. ?Perform blood tests and urine tests to check for: ?Urinary tract infection. ?Sexually transmitted infections (STIs). ?Low iron levels in your blood (anemia). ?Blood type and certain proteins on red blood cells (Rh antibodies). ?Infections and immunity to viruses, such as hepatitis B and rubella. ?HIV (human immunodeficiency virus). ?Discuss your options for genetic screening. ?Tips about staying healthy ?Your health care provider will also give you information about how to keep yourself and your baby healthy, including: ?Nutrition and taking vitamins. ?Physical activity. ?How to manage pregnancy symptoms such as nausea and vomiting (morning sickness). ?Infections and substances that may be harmful to your baby and how to avoid them. ?Food safety. ?Dental care. ?Working. ?Travel. ?Warning signs to watch for and when to call your health care provider. ?How often will I have prenatal care visits? ?After your first prenatal care visit, you will have regular visits throughout your pregnancy. The visit schedule is often as follows: ?Up to week 28 of pregnancy: once every 4 weeks. ?28-36 weeks: once every 2 weeks. ?After 36 weeks: every week until delivery. ?Some women may have visits more or less often depending on any underlying health conditions and the health of the baby. ?Keep all follow-up and prenatal care visits. This is important. ?What happens during routine prenatal care visits? ?Your health care provider will: ?Measure your weight and blood pressure. ?Check for fetal heart sounds. ?Measure the height of your uterus in your abdomen (fundal height). This may be measured starting around week 20 of pregnancy. ?Check the position of your baby inside your uterus. ?Ask questions   about your diet, sleeping patterns, and whether you can feel the baby  move. ?Review warning signs to watch for and signs of labor. ?Ask about any pregnancy symptoms you are having and how you are dealing with them. Symptoms may include: ?Headaches. ?Nausea and vomiting. ?Vaginal discharge. ?Swelling. ?Fatigue. ?Constipation. ?Changes in your vision. ?Feeling persistently sad or anxious. ?Any discomfort, including back or pelvic pain. ?Bleeding or spotting. ?Make a list of questions to ask your health care provider at your routine visits. ?What tests might I have during prenatal care visits? ?You may have blood, urine, and imaging tests throughout your pregnancy, such as: ?Urine tests to check for glucose, protein, or signs of infection. ?Glucose tests to check for a form of diabetes that can develop during pregnancy (gestational diabetes mellitus). This is usually done around week 24 of pregnancy. ?Ultrasounds to check your baby's growth and development, to check for birth defects, and to check your baby's well-being. These can also help to decide when you should deliver your baby. ?A test to check for group B strep (GBS) infection. This is usually done around week 36 of pregnancy. ?Genetic testing. This may include blood, fluid, or tissue sampling, or imaging tests, such as an ultrasound. Some genetic tests are done during the first trimester and some are done during the second trimester. ?What else can I expect during prenatal care visits? ?Your health care provider may recommend getting certain vaccines during pregnancy. These may include: ?A yearly flu shot (annual influenza vaccine). This is especially important if you will be pregnant during flu season. ?Tdap (tetanus, diphtheria, pertussis) vaccine. Getting this vaccine during pregnancy can protect your baby from whooping cough (pertussis) after birth. This vaccine may be recommended between weeks 27 and 36 of pregnancy. ?A COVID-19 vaccine. ?Later in your pregnancy, your health care provider may give you information  about: ?Childbirth and breastfeeding classes. ?Choosing a health care provider for your baby. ?Umbilical cord banking. ?Breastfeeding. ?Birth control after your baby is born. ?The hospital labor and delivery unit and how to set up a tour. ?Registering at the hospital before you go into labor. ?Where to find more information ?Office on Women's Health: womenshealth.gov ?American Pregnancy Association: americanpregnancy.org ?March of Dimes: marchofdimes.org ?Summary ?Prenatal care helps you and your baby stay as healthy as possible during pregnancy. ?Your first prenatal care visit will most likely be the longest. ?You will have visits and tests throughout your pregnancy to monitor your health and your baby's health. ?Bring a list of questions to your visits to ask your health care provider. ?Make sure to keep all follow-up and prenatal care visits. ?This information is not intended to replace advice given to you by your health care provider. Make sure you discuss any questions you have with your health care provider. ?Document Revised: 10/03/2019 Document Reviewed: 10/03/2019 ?Elsevier Patient Education ? 2022 Elsevier Inc. ? ?

## 2021-04-13 NOTE — Progress Notes (Addendum)
Subjective:  ? ? Sydney James is a 26 y.o. female who presents for evaluation of amenorrhea. She believes she could be pregnant. Pregnancy is desired. Sexual Activity: single partner, contraception: none. Current symptoms also include: fatigue. Last period was abnormal. She notes increased vaginal itching and discharge the past few days.  ?  ?No LMP recorded. ?The following portions of the patient's history were reviewed and updated as appropriate: allergies, current medications, past family history, past medical history, past social history, past surgical history, and problem list. ? ?Review of Systems ?Pertinent items are noted in HPI.   ?  ?Objective:  ? ? There were no vitals taken for this visit. ?General: alert, cooperative, appears stated age, no distress, and no acute distress   ? ?Lab Review ?Urine HCG: positive  ?  ?Assessment:  ? ? Absence of menstruation.   ?  ?Plan:  ? ? Pregnancy Test: Positive: EDC: 10/20/21. Briefly discussed pre-natal care options.. Encouraged well-balanced diet, plenty of rest when needed, pre-natal vitamins daily and walking for exercise. Discussed self-help for nausea, avoiding OTC medications until consulting provider or pharmacist, other than Tylenol as needed, minimal caffeine (1-2 cups daily) and avoiding alcohol. She will schedule u/s for dating, nurse visit at @ 10 wks,  her initial OB visit @ 12 wks.  Vaginal swab collected. Feel free to call with any questions.  ? ?Philip Aspen, CNM  ?

## 2021-04-14 ENCOUNTER — Other Ambulatory Visit: Payer: Self-pay | Admitting: Certified Nurse Midwife

## 2021-04-14 ENCOUNTER — Encounter: Payer: Self-pay | Admitting: Certified Nurse Midwife

## 2021-04-14 LAB — CERVICOVAGINAL ANCILLARY ONLY
Bacterial Vaginitis (gardnerella): NEGATIVE
Candida Glabrata: NEGATIVE
Candida Vaginitis: POSITIVE — AB
Comment: NEGATIVE
Comment: NEGATIVE
Comment: NEGATIVE

## 2021-04-15 ENCOUNTER — Encounter: Payer: Self-pay | Admitting: Certified Nurse Midwife

## 2021-04-15 ENCOUNTER — Other Ambulatory Visit: Payer: Self-pay | Admitting: Certified Nurse Midwife

## 2021-04-15 MED ORDER — FLUCONAZOLE 150 MG PO TABS: 150.0000 mg | ORAL_TABLET | Freq: Every day | ORAL | 0 refills | Status: DC

## 2021-04-15 NOTE — Progress Notes (Signed)
Pt has allergy to lubricants a azoles however she has taken diflucan in the past. Consulted Dr. Marcelline Mates on use and she is in agreement to treat with diflucan. Orders placed.  ? ?Philip Aspen, CNM  ?

## 2021-04-16 ENCOUNTER — Ambulatory Visit (INDEPENDENT_AMBULATORY_CARE_PROVIDER_SITE_OTHER)

## 2021-04-16 DIAGNOSIS — Z3A01 Less than 8 weeks gestation of pregnancy: Secondary | ICD-10-CM

## 2021-04-16 DIAGNOSIS — Z3200 Encounter for pregnancy test, result unknown: Secondary | ICD-10-CM

## 2021-04-16 DIAGNOSIS — Z3481 Encounter for supervision of other normal pregnancy, first trimester: Secondary | ICD-10-CM

## 2021-04-20 ENCOUNTER — Encounter: Admitting: Certified Nurse Midwife

## 2021-04-27 ENCOUNTER — Encounter: Payer: Self-pay | Admitting: Certified Nurse Midwife

## 2021-04-28 ENCOUNTER — Other Ambulatory Visit: Payer: Self-pay

## 2021-04-28 MED ORDER — FLUCONAZOLE 150 MG PO TABS: 150.0000 mg | ORAL_TABLET | Freq: Every day | ORAL | 0 refills | Status: DC

## 2021-05-04 ENCOUNTER — Other Ambulatory Visit: Payer: Self-pay | Admitting: Certified Nurse Midwife

## 2021-05-04 ENCOUNTER — Encounter: Payer: Self-pay | Admitting: Certified Nurse Midwife

## 2021-05-04 DIAGNOSIS — Z3A10 10 weeks gestation of pregnancy: Secondary | ICD-10-CM

## 2021-05-05 ENCOUNTER — Telehealth: Payer: Self-pay | Admitting: Certified Nurse Midwife

## 2021-05-05 NOTE — Telephone Encounter (Signed)
Patient called asking if Clindamycin is safe to use during first trimester. Please advise. Pt asking for any providers input as she knows that annie only works certain days. Please advise. ?

## 2021-05-12 ENCOUNTER — Other Ambulatory Visit: Payer: Self-pay | Admitting: Certified Nurse Midwife

## 2021-05-12 MED ORDER — FLUCONAZOLE 150 MG PO TABS: 150.0000 mg | ORAL_TABLET | Freq: Every day | ORAL | 0 refills | Status: DC

## 2021-05-13 NOTE — Patient Instructions (Signed)
First Trimester of Pregnancy ? ?The first trimester of pregnancy starts on the first day of your last menstrual period until the end of week 12. This is also called months 1 through 3 of pregnancy. ?Body changes during your first trimester ?Your body goes through many changes during pregnancy. The changes usually return to normal after your baby is born. ?Physical changes ?You may gain or lose weight. ?Your breasts may grow larger and hurt. The area around your nipples may get darker. ?Dark spots or blotches may develop on your face. ?You may have changes in your hair. ?Health changes ?You may feel like you might vomit (nauseous), and you may vomit. ?You may have heartburn. ?You may have headaches. ?You may have trouble pooping (constipation). ?Your gums may bleed. ?Other changes ?You may get tired easily. ?You may pee (urinate) more often. ?Your menstrual periods will stop. ?You may not feel hungry. ?You may want to eat certain kinds of food. ?You may have changes in your emotions from day to day. ?You may have more dreams. ?Follow these instructions at home: ?Medicines ?Take over-the-counter and prescription medicines only as told by your doctor. Some medicines are not safe during pregnancy. ?Take a prenatal vitamin that contains at least 600 micrograms (mcg) of folic acid. ?Eating and drinking ?Eat healthy meals that include: ?Fresh fruits and vegetables. ?Whole grains. ?Good sources of protein, such as meat, eggs, or tofu. ?Low-fat dairy products. ?Avoid raw meat and unpasteurized juice, milk, and cheese. ?If you feel like you may vomit, or you vomit: ?Eat 4 or 5 small meals a day instead of 3 large meals. ?Try eating a few soda crackers. ?Drink liquids between meals instead of during meals. ?You may need to take these actions to prevent or treat trouble pooping: ?Drink enough fluids to keep your pee (urine) pale yellow. ?Eat foods that are high in fiber. These include beans, whole grains, and fresh fruits and  vegetables. ?Limit foods that are high in fat and sugar. These include fried or sweet foods. ?Activity ?Exercise only as told by your doctor. Most people can do their usual exercise routine during pregnancy. ?Stop exercising if you have cramps or pain in your lower belly (abdomen) or low back. ?Do not exercise if it is too hot or too humid, or if you are in a place of great height (high altitude). ?Avoid heavy lifting. ?If you choose to, you may have sex unless your doctor tells you not to. ?Relieving pain and discomfort ?Wear a good support bra if your breasts are sore. ?Rest with your legs raised (elevated) if you have leg cramps or low back pain. ?If you have bulging veins (varicose veins) in your legs: ?Wear support hose as told by your doctor. ?Raise your feet for 15 minutes, 3-4 times a day. ?Limit salt in your food. ?Safety ?Wear your seat belt at all times when you are in a car. ?Talk with your doctor if someone is hurting you or yelling at you. ?Talk with your doctor if you are feeling sad or have thoughts of hurting yourself. ?Lifestyle ?Do not use hot tubs, steam rooms, or saunas. ?Do not douche. Do not use tampons or scented sanitary pads. ?Do not use herbal medicines, illegal drugs, or medicines that are not approved by your doctor. Do not drink alcohol. ?Do not smoke or use any products that contain nicotine or tobacco. If you need help quitting, ask your doctor. ?Avoid cat litter boxes and soil that is used by cats. These carry   germs that can cause harm to the baby and can cause a loss of your baby by miscarriage or stillbirth. ?General instructions ?Keep all follow-up visits. This is important. ?Ask for help if you need counseling or if you need help with nutrition. Your doctor can give you advice or tell you where to go for help. ?Visit your dentist. At home, brush your teeth with a soft toothbrush. Floss gently. ?Write down your questions. Take them to your prenatal visits. ?Where to find more  information ?American Pregnancy Association: americanpregnancy.org ?[employer]: www.acog.org ?Office on Women's Health: MightyReward.co.nz ?Contact a doctor if: ?You are dizzy. ?You have a fever. ?You have mild cramps or pressure in your lower belly. ?You have a nagging pain in your belly area. ?You continue to feel like you may vomit, you vomit, or you have watery poop (diarrhea) for 24 hours or longer. ?You have a bad-smelling fluid coming from your vagina. ?You have pain when you pee. ?You are exposed to a disease that spreads from person to person, such as chickenpox, measles, Zika virus, HIV, or hepatitis. ?Get help right away if: ?You have spotting or bleeding from your vagina. ?You have very bad belly cramping or pain. ?You have shortness of breath or chest pain. ?You have any kind of injury, such as from a fall or a car crash. ?You have new or increased pain, swelling, or redness in an arm or leg. ?Summary ?The first trimester of pregnancy starts on the first day of your last menstrual period until the end of week 12 (months 1 through 3). ?Eat 4 or 5 small meals a day instead of 3 large meals. ?Do not smoke or use any products that contain nicotine or tobacco. If you need help quitting, ask your doctor. ?Keep all follow-up visits. ?This information is not intended to replace advice given to you by your health care provider. Make sure you discuss any questions you have with your health care provider. ?Document Revised: 05/29/2019 Document Reviewed: 04/04/2019 ?Elsevier Patient Education ? 2023 Elsevier Inc. ?Morning Sickness ? ?Morning sickness is when you feel like you may vomit (feel nauseous) during pregnancy. Sometimes, you may vomit. Morning sickness most often happens in the morning, but it can also happen at any time of the day. Some women may have morning sickness that makes them vomit all the time. This is a more serious problem that needs  treatment. ?What are the causes? ?The cause of this condition is not known. ?What increases the risk? ?You had vomiting or a feeling like you may vomit before your pregnancy. ?You had morning sickness in another pregnancy. ?You are pregnant with more than one baby, such as twins. ?What are the signs or symptoms? ?Feeling like you may vomit. ?Vomiting. ?How is this treated? ?Treatment is usually not needed for this condition. You may only need to change what you eat. In some cases, your doctor may give you some things to take for your condition. These include: ?Vitamin B6 supplements. ?Medicines to treat the feeling that you may vomit. ?Ginger. ?Follow these instructions at home: ?Medicines ?Take over-the-counter and prescription medicines only as told by your doctor. Do not take any medicines until you talk with your doctor about them first. ?Take multivitamins before you get pregnant. These can stop or lessen the symptoms of morning sickness. ?Eating and drinking ?Eat dry toast or crackers before getting out of bed. ?Eat 5 or 6 small meals a day. ?Eat dry and bland foods like  rice and baked potatoes. ?Do not eat greasy, fatty, or spicy foods. ?Have someone cook for you if the smell of food causes you to vomit or to feel like you may vomit. ?If you feel like you may vomit after taking prenatal vitamins, take them at night or with a snack. ?Eat protein foods when you need a snack. Nuts, yogurt, and cheese are good choices. ?Drink fluids throughout the day. ?Try ginger ale made with real ginger, ginger tea made from fresh grated ginger, or ginger candies. ?General instructions ?Do not smoke or use any products that contain nicotine or tobacco. If you need help quitting, ask your doctor. ?Use an air purifier to keep the air in your house free of smells. ?Get lots of fresh air. ?Try to avoid smells that make you feel sick. ?Try wearing an acupressure wristband. This is a wristband that is used to treat seasickness. ?Try  a treatment called acupuncture. In this treatment, a doctor puts needles into certain areas of your body to make you feel better. ?Contact a doctor if: ?You need medicine to feel better. ?You feel dizzy or light-head

## 2021-05-14 ENCOUNTER — Ambulatory Visit (INDEPENDENT_AMBULATORY_CARE_PROVIDER_SITE_OTHER)

## 2021-05-14 ENCOUNTER — Other Ambulatory Visit (HOSPITAL_COMMUNITY)
Admission: RE | Admit: 2021-05-14 | Discharge: 2021-05-14 | Disposition: A | Discharge status: Home / Self Care | Source: Ambulatory Visit | Attending: Certified Nurse Midwife | Admitting: Certified Nurse Midwife

## 2021-05-14 ENCOUNTER — Ambulatory Visit (INDEPENDENT_AMBULATORY_CARE_PROVIDER_SITE_OTHER): Admitting: Certified Nurse Midwife

## 2021-05-14 ENCOUNTER — Other Ambulatory Visit: Payer: Self-pay | Admitting: Certified Nurse Midwife

## 2021-05-14 VITALS — BP 141/85 | HR 87 | Resp 16 | Ht 67.0 in | Wt 207.4 lb

## 2021-05-14 DIAGNOSIS — O99210 Obesity complicating pregnancy, unspecified trimester: Secondary | ICD-10-CM

## 2021-05-14 DIAGNOSIS — N898 Other specified noninflammatory disorders of vagina: Secondary | ICD-10-CM

## 2021-05-14 DIAGNOSIS — O26893 Other specified pregnancy related conditions, third trimester: Secondary | ICD-10-CM

## 2021-05-14 DIAGNOSIS — E669 Obesity, unspecified: Secondary | ICD-10-CM | POA: Diagnosis not present

## 2021-05-14 DIAGNOSIS — Z113 Encounter for screening for infections with a predominantly sexual mode of transmission: Secondary | ICD-10-CM

## 2021-05-14 DIAGNOSIS — T7589XA Other specified effects of external causes, initial encounter: Secondary | ICD-10-CM

## 2021-05-14 DIAGNOSIS — O99211 Obesity complicating pregnancy, first trimester: Secondary | ICD-10-CM

## 2021-05-14 DIAGNOSIS — Z3A10 10 weeks gestation of pregnancy: Secondary | ICD-10-CM

## 2021-05-14 DIAGNOSIS — Z3481 Encounter for supervision of other normal pregnancy, first trimester: Secondary | ICD-10-CM

## 2021-05-14 DIAGNOSIS — Z3491 Encounter for supervision of normal pregnancy, unspecified, first trimester: Secondary | ICD-10-CM

## 2021-05-14 DIAGNOSIS — Z3A11 11 weeks gestation of pregnancy: Secondary | ICD-10-CM

## 2021-05-14 NOTE — Progress Notes (Signed)
Sydney James presents for NOB nurse interview visit. Pregnancy confirmation done 04/13/2021.  [id number]. Pregnancy education material explained and given. Two cats in the home. NOB labs ordered. (TSH/HbgA1c due to Increased BMI. Body mass index is 32.48 kg/m?[name] Kitchen Sickle cell not needed. HIV and Drug screen labs were explained optional and she did not decline. Drug screen ordered. PNV encouraged. Genetic screening options discussed. Genetic testing: Will order at next visit.  Pt may discuss with provider.  Financial policy reviewed. FMLA form reviewed and signed. Patient to follow up with Doreene Burke in 2 weeks for NOB physical. Sydney James has some symptoms of yeast infection and would like to be treated with Diflucan because she is allergic to Monistat. Self swab obtained during visit. All questions answered. ? ? ? ?

## 2021-05-15 LAB — URINALYSIS, ROUTINE W REFLEX MICROSCOPIC
Bilirubin, UA: NEGATIVE
Glucose, UA: NEGATIVE
Ketones, UA: NEGATIVE
Nitrite, UA: NEGATIVE
RBC, UA: NEGATIVE
Specific Gravity, UA: >=1.03 — AB (ref 1.005–1.030)
Urobilinogen, Ur: 0.2 mg/dL (ref 0.2–1.0)
pH, UA: 6.5 (ref 5.0–7.5)

## 2021-05-15 LAB — MICROSCOPIC EXAMINATION
Casts: NONE SEEN /lpf
Epithelial Cells (non renal): >10 /hpf — AB (ref 0–10)
RBC, Urine: NONE SEEN /hpf (ref 0–2)

## 2021-05-17 LAB — CULTURE, OB URINE

## 2021-05-17 LAB — VIRAL HEPATITIS HBV, HCV
HCV Ab: NONREACTIVE
Hep B Core Total Ab: NEGATIVE
Hep B Surface Ab, Qual: NONREACTIVE
Hepatitis B Surface Ag: NEGATIVE

## 2021-05-17 LAB — RUBELLA SCREEN: Rubella Antibodies, IGG: 1.59 index (ref >0.99)

## 2021-05-17 LAB — RPR: RPR Ser Ql: NONREACTIVE

## 2021-05-17 LAB — PARVOVIRUS B19 ANTIBODY, IGG AND IGM
Parvovirus B19 IgG: 3.4 index — ABNORMAL HIGH (ref 0.0–0.8)
Parvovirus B19 IgM: 0.2 index (ref 0.0–0.8)

## 2021-05-17 LAB — HIV ANTIBODY (ROUTINE TESTING W REFLEX): HIV Screen 4th Generation wRfx: NONREACTIVE

## 2021-05-17 LAB — HCV INTERPRETATION

## 2021-05-17 LAB — TOXOPLASMA ANTIBODIES- IGG AND  IGM
Toxoplasma Antibody- IgM: <3 AU/mL (ref 0.0–7.9)
Toxoplasma IgG Ratio: <3 IU/mL (ref 0.0–7.1)

## 2021-05-17 LAB — HEMOGLOBIN A1C
Est. average glucose Bld gHb Est-mCnc: 111 mg/dL
Hgb A1c MFr Bld: 5.5 % (ref 4.8–5.6)

## 2021-05-17 LAB — ABO AND RH: Rh Factor: POSITIVE

## 2021-05-17 LAB — URINE CULTURE, OB REFLEX

## 2021-05-17 LAB — CERVICOVAGINAL ANCILLARY ONLY
Bacterial Vaginitis (gardnerella): NEGATIVE
Comment: NEGATIVE

## 2021-05-17 LAB — TSH: TSH: 0.443 u[IU]/mL — ABNORMAL LOW (ref 0.450–4.500)

## 2021-05-17 LAB — ANTIBODY SCREEN: Antibody Screen: NEGATIVE

## 2021-05-17 LAB — VARICELLA ZOSTER ANTIBODY, IGG: Varicella zoster IgG: 673 index (ref >165)

## 2021-05-18 LAB — PAIN MGT SCRN (14 DRUGS), UR
Amphetamine Scrn, Ur: NEGATIVE ng/mL
BARBITURATE SCREEN URINE: NEGATIVE ng/mL
BENZODIAZEPINE SCREEN, URINE: NEGATIVE ng/mL
Buprenorphine, Urine: NEGATIVE ng/mL
CANNABINOIDS UR QL SCN: NEGATIVE ng/mL
Cocaine (Metab) Scrn, Ur: NEGATIVE ng/mL
Creatinine(Crt), U: 175.1 mg/dL (ref 20.0–300.0)
Fentanyl, Urine: NEGATIVE pg/mL
Meperidine Screen, Urine: NEGATIVE ng/mL
Methadone Screen, Urine: NEGATIVE ng/mL
OXYCODONE+OXYMORPHONE UR QL SCN: NEGATIVE ng/mL
Opiate Scrn, Ur: NEGATIVE ng/mL
Ph of Urine: 5.6 (ref 4.5–8.9)
Phencyclidine Qn, Ur: NEGATIVE ng/mL
Propoxyphene Scrn, Ur: NEGATIVE ng/mL
Tramadol Screen, Urine: NEGATIVE ng/mL

## 2021-05-18 LAB — GC/CHLAMYDIA PROBE AMP
Chlamydia trachomatis, NAA: NEGATIVE
Neisseria Gonorrhoeae by PCR: NEGATIVE

## 2021-05-18 LAB — NICOTINE SCREEN, URINE: Cotinine Ql Scrn, Ur: NEGATIVE ng/mL

## 2021-05-19 NOTE — Progress Notes (Signed)
MyChart message sent to patient to schedule nurse visit for injection.  ?

## 2021-05-21 ENCOUNTER — Ambulatory Visit (INDEPENDENT_AMBULATORY_CARE_PROVIDER_SITE_OTHER): Admitting: Certified Nurse Midwife

## 2021-05-21 DIAGNOSIS — O2341 Unspecified infection of urinary tract in pregnancy, first trimester: Secondary | ICD-10-CM

## 2021-05-21 NOTE — Progress Notes (Signed)
Patient presents today with UTI. She states she is not having any current symptoms at this time. Spoke with provider in office and advised to not give the injection due to culture levels and patient not having symptoms. She did state when she came in last week she did feel like she had a yeast infection but with no treatment all symptoms and concerns went away. Patient states she is perfectly content with not receiving injection and will follow-up with Doreene Burke, CNM this upcoming Wednesday. All questions asked.

## 2021-05-24 ENCOUNTER — Telehealth: Payer: Self-pay | Admitting: Certified Nurse Midwife

## 2021-05-24 NOTE — Telephone Encounter (Signed)
Pt called concerned she did not see the a lab request  for genetic testing in her My Chart. Pt states " she was informed that she would have them at her last visit per nurse and fill-in lab tech. Informed her it was noted the testing would be done at her next visit 05/24. Pt verbalized she understood but was still upset.

## 2021-05-26 ENCOUNTER — Encounter: Payer: Self-pay | Admitting: Certified Nurse Midwife

## 2021-05-26 ENCOUNTER — Ambulatory Visit (INDEPENDENT_AMBULATORY_CARE_PROVIDER_SITE_OTHER): Admitting: Certified Nurse Midwife

## 2021-05-26 VITALS — BP 136/85 | HR 87 | Wt 207.8 lb

## 2021-05-26 DIAGNOSIS — Z3A13 13 weeks gestation of pregnancy: Secondary | ICD-10-CM

## 2021-05-26 LAB — POCT URINALYSIS DIPSTICK OB
Bilirubin, UA: NEGATIVE
Blood, UA: NEGATIVE
Glucose, UA: NEGATIVE
Ketones, UA: NEGATIVE
Leukocytes, UA: NEGATIVE
Nitrite, UA: NEGATIVE
POC,PROTEIN,UA: NEGATIVE
Spec Grav, UA: 1.015 (ref 1.010–1.025)
Urobilinogen, UA: 0.2 E.U./dL
pH, UA: 6 (ref 5.0–8.0)

## 2021-05-26 MED ORDER — ASPIRIN 81 MG PO TBEC: 162.0000 mg | DELAYED_RELEASE_TABLET | Freq: Every day | ORAL | 12 refills | Status: DC

## 2021-05-26 NOTE — Progress Notes (Signed)
NEW OB HISTORY AND PHYSICAL  SUBJECTIVE:       Sydney James is a 26 y.o. [phone] female, Patient's last menstrual period was 02/20/2021 (exact date)., Estimated Date of Delivery: 11/27/21, [redacted]w[redacted]d, presents today for establishment of Prenatal Care. She has no unusual complaints Body mass index is 32.55 kg/m.  She has history of gestational hypertension, pre eclampsia, preterm delivery and c/section in previous pregnancies. She also has a history of degenerative disc disease causing chronic back pain    Gynecologic History Patient's last menstrual period was 02/20/2021 (exact date). Normal Contraception: none Last Pap: 04/08/2020. Results were: normal  Obstetric History OB History  Gravida Para Term Preterm AB Living  8 3 1 2 4 3   SAB IAB Ectopic Multiple Live Births  3 0 0 0 3    # Outcome Date GA Lbr Len/2nd Weight Sex Delivery Anes PTL Lv  8 Current           7 Term 10/04/20 [redacted]w[redacted]d / 00:18 6 lb 14.1 oz (3.12 kg) M Vag-Spont EPI  LIV  6 AB 07/18/19 [redacted]w[redacted]d    SAB     5 Preterm 12/22/17 [redacted]w[redacted]d  4 lb 9.4 oz (2.08 kg) M CS-LTranv EPI, Spinal  LIV  4 SAB 09/2016          3 SAB 2018          2 Preterm 01/25/15 [redacted]w[redacted]d 03:30 / 01:45 6 lb 4.2 oz (2.84 kg) M Vag-Spont EPI  LIV  1 SAB 2011            Obstetric Comments  G6- missed AB at [redacted] weeks gestation    Past Medical History:  Diagnosis Date   Lumbar herniated disc    Migraine    Preeclampsia    PVC's (premature ventricular contractions)    Raynaud's disease    Scoliosis    Back brace for 2 years   Scoliosis     Past Surgical History:  Procedure Laterality Date   CESAREAN SECTION N/A 12/22/2017   Procedure: CESAREAN SECTION;  Surgeon: 12/24/2017, MD;  Location: [facility] ORS;  Service: Obstetrics;  Laterality: N/A;  Female Born @ 52     DILATION AND CURETTAGE OF UTERUS     DILATION AND EVACUATION N/A 03/15/2016   Procedure: DILATATION AND EVACUATION;  Surgeon: 03/17/2016, MD;  Location: [facility] ORS;  Service:  Gynecology;  Laterality: N/A;   DILATION AND EVACUATION N/A 07/18/2019   Procedure: DILATATION AND EVACUATION;  Surgeon: 07/20/2019, MD;  Location: [facility] ORS;  Service: Gynecology;  Laterality: N/A;   WISDOM TOOTH EXTRACTION      Current Outpatient Medications on File Prior to Visit  Medication Sig Dispense Refill   Prenatal Vit-Fe Fumarate-FA (MULTIVITAMIN-PRENATAL) 27-0.8 MG TABS tablet Take 1 tablet by mouth daily at 12 noon.     fluconazole (DIFLUCAN) 150 MG tablet Take 1 tablet (150 mg total) by mouth daily. (Patient not taking: Reported on 05/14/2021) 1 tablet 0   No current facility-administered medications on file prior to visit.    Allergies  Allergen Reactions   Aloe Hives   Lubricants     Has to have water based lubricant. 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  Tobacco Use   Smoking status: Never   Smokeless tobacco: Never  Vaping Use   Vaping Use: Never used  Substance and Sexual Activity   Alcohol use: No   Drug use: No   Sexual activity: Not Currently  Other Topics Concern   Not on file  Social History Narrative   Not on file   Social Determinants of Health   Financial Resource Strain: Not on file  Food Insecurity: Not on file  Transportation Needs: Not on file  Physical Activity: Not on file  Stress: Not on file  Social Connections: Not on file  Intimate Partner Violence: Not on file    Family History  Problem Relation Age of Onset   Hypertension Father    Hypertension Mother    Early death Mother    Cancer Maternal Grandmother    Cancer Maternal Grandfather    Diabetes Maternal Grandfather    Cancer  Paternal Grandmother    Diabetes Paternal Grandfather     The following portions of the patient's history were reviewed and updated as appropriate: allergies, current medications, past OB history, past medical history, past surgical history, past family history, past social history, and problem list.    OBJECTIVE: Initial Physical Exam (New OB)  GENERAL APPEARANCE: alert, well appearing, in no apparent distress, oriented to person, place and time, overweight HEAD: normocephalic, atraumatic MOUTH: mucous membranes moist, pharynx normal without lesions THYROID: no thyromegaly or masses present BREASTS: no masses noted, no significant tenderness, no palpable axillary nodes, no skin changes LUNGS: clear to auscultation, no wheezes, rales or rhonchi, symmetric air entry HEART: regular rate and rhythm, no murmurs ABDOMEN: soft, nontender, nondistended, no abnormal masses, no epigastric pain, obese, and fetal heart tones not found with dopplar, u/s 145 EXTREMITIES: no redness or tenderness in the calves or thighs, no edema, no limitation in range of motion, intact peripheral pulses SKIN: normal coloration and turgor, no rashes LYMPH NODES: no adenopathy palpable NEUROLOGIC: alert, oriented, normal speech, no focal findings or movement disorder noted  PELVIC EXAM deferred, tested pelvis  ASSESSMENT: Normal pregnancy  PLAN: New OB counseling: The patient has been given an overview regarding routine prenatal care. Recommendations regarding diet, weight gain, and exercise in pregnancy were given. Prenatal testing, optional genetic testing, carrier screening, and ultrasound use in pregnancy were reviewed.  Maternit 21 today. Benefits of Breast Feeding were discussed. The patient is encouraged to consider nursing her baby post partum.   Doreene Burke, CNM

## 2021-05-27 LAB — CBC
Hematocrit: 38.4 % (ref 34.0–46.6)
Hemoglobin: 13.3 g/dL (ref 11.1–15.9)
MCH: 29.3 pg (ref 26.6–33.0)
MCHC: 34.6 g/dL (ref 31.5–35.7)
MCV: 85 fL (ref 79–97)
Platelets: 261 10*3/uL (ref 150–450)
RBC: 4.54 x10E6/uL (ref 3.77–5.28)
RDW: 13 % (ref 11.7–15.4)
WBC: 8.6 10*3/uL (ref 3.4–10.8)

## 2021-05-30 ENCOUNTER — Encounter: Payer: Self-pay | Admitting: Certified Nurse Midwife

## 2021-06-01 ENCOUNTER — Other Ambulatory Visit: Payer: Self-pay | Admitting: Certified Nurse Midwife

## 2021-06-01 MED ORDER — ONDANSETRON HCL 4 MG PO TABS: 4.0000 mg | ORAL_TABLET | Freq: Three times a day (TID) | ORAL | 0 refills | Status: DC | PRN

## 2021-06-01 NOTE — Progress Notes (Signed)
zo

## 2021-06-03 LAB — MATERNIT21  PLUS CORE+ESS+SCA, BLOOD
11q23 deletion (Jacobsen): NOT DETECTED
15q11 deletion (PW Angelman): NOT DETECTED
1p36 deletion syndrome: NOT DETECTED
22q11 deletion (DiGeorge): NOT DETECTED
4p16 deletion(Wolf-Hirschhorn): NOT DETECTED
5p15 deletion (Cri-du-chat): NOT DETECTED
8q24 deletion (Langer-Giedion): NOT DETECTED
Fetal Fraction: 8
Monosomy X (Turner Syndrome): NOT DETECTED
Result (T21): NEGATIVE
Trisomy 13 (Patau syndrome): NEGATIVE
Trisomy 16: NOT DETECTED
Trisomy 18 (Edwards syndrome): NEGATIVE
Trisomy 21 (Down syndrome): NEGATIVE
Trisomy 22: NOT DETECTED
XXX (Triple X Syndrome): NOT DETECTED
XXY (Klinefelter Syndrome): NOT DETECTED
XYY (Jacobs Syndrome): NOT DETECTED

## 2021-06-08 ENCOUNTER — Telehealth: Payer: Self-pay | Admitting: Obstetrics

## 2021-06-08 NOTE — Telephone Encounter (Signed)
Pt called stating that she has been having deep uterine pain that started Saturday intermittently states it is now worse and constant- now in back, rates 6/10 on pain scale, pt denies bleeding but has a constant headache she is currently [redacted] weeks pregnant. Please advise.

## 2021-06-10 ENCOUNTER — Emergency Department
Admission: EM | Admit: 2021-06-10 | Discharge: 2021-06-10 | Disposition: A | Discharge status: Home / Self Care | Attending: Emergency Medicine | Admitting: Emergency Medicine

## 2021-06-10 ENCOUNTER — Other Ambulatory Visit: Payer: Self-pay

## 2021-06-10 ENCOUNTER — Emergency Department

## 2021-06-10 DIAGNOSIS — N920 Excessive and frequent menstruation with regular cycle: Secondary | ICD-10-CM

## 2021-06-10 DIAGNOSIS — O26892 Other specified pregnancy related conditions, second trimester: Secondary | ICD-10-CM | POA: Insufficient documentation

## 2021-06-10 DIAGNOSIS — R102 Pelvic and perineal pain: Secondary | ICD-10-CM | POA: Diagnosis not present

## 2021-06-10 DIAGNOSIS — Z3A15 15 weeks gestation of pregnancy: Secondary | ICD-10-CM | POA: Insufficient documentation

## 2021-06-10 LAB — CBC
HCT: 38.6 % (ref 36.0–46.0)
Hemoglobin: 12.6 g/dL (ref 12.0–15.0)
MCH: 28.4 pg (ref 26.0–34.0)
MCHC: 32.6 g/dL (ref 30.0–36.0)
MCV: 87.1 fL (ref 80.0–100.0)
Platelets: 246 10*3/uL (ref 150–400)
RBC: 4.43 MIL/uL (ref 3.87–5.11)
RDW: 13.2 % (ref 11.5–15.5)
WBC: 8.8 10*3/uL (ref 4.0–10.5)
nRBC: 0 % (ref 0.0–0.2)

## 2021-06-10 LAB — URINALYSIS, ROUTINE W REFLEX MICROSCOPIC
Bilirubin Urine: NEGATIVE
Glucose, UA: NEGATIVE mg/dL
Hgb urine dipstick: NEGATIVE
Ketones, ur: NEGATIVE mg/dL
Leukocytes,Ua: NEGATIVE
Nitrite: NEGATIVE
Protein, ur: NEGATIVE mg/dL
Specific Gravity, Urine: 1.006 (ref 1.005–1.030)
pH: 6 (ref 5.0–8.0)

## 2021-06-10 LAB — COMPREHENSIVE METABOLIC PANEL
ALT: 22 U/L (ref 0–44)
AST: 21 U/L (ref 15–41)
Albumin: 3.3 g/dL — ABNORMAL LOW (ref 3.5–5.0)
Alkaline Phosphatase: 66 U/L (ref 38–126)
Anion gap: 3 — ABNORMAL LOW (ref 5–15)
BUN: 6 mg/dL (ref 6–20)
CO2: 27 mmol/L (ref 22–32)
Calcium: 9.9 mg/dL (ref 8.9–10.3)
Chloride: 109 mmol/L (ref 98–111)
Creatinine, Ser: 0.49 mg/dL (ref 0.44–1.00)
GFR, Estimated: >60 mL/min (ref >60)
Glucose, Bld: 101 mg/dL — ABNORMAL HIGH (ref 70–99)
Potassium: 3.8 mmol/L (ref 3.5–5.1)
Sodium: 139 mmol/L (ref 135–145)
Total Bilirubin: 0.3 mg/dL (ref 0.3–1.2)
Total Protein: 6.8 g/dL (ref 6.5–8.1)

## 2021-06-10 LAB — HCG, QUANTITATIVE, PREGNANCY: hCG, Beta Chain, Quant, S: 29589 m[IU]/mL — ABNORMAL HIGH (ref <5)

## 2021-06-10 LAB — LIPASE, BLOOD: Lipase: 25 U/L (ref 11–51)

## 2021-06-10 NOTE — Discharge Instructions (Signed)
Your labs, urine, and ultrasound are normal. Return to the ER immediately for any new, worsening, or change in symptoms or other concerns including worsening pain, fever, vomiting, or any other concerns. Please follow up with your OBGYN this week.

## 2021-06-10 NOTE — ED Provider Triage Note (Signed)
Emergency Medicine Provider Triage Evaluation Note  Sydney James, a 26 y.o. female  was evaluated in triage.  Pt complains of pelvic cramping and pink-tinged spotting when wiping. She is [redacted] weeks gestation under the care of [facility]. She was advised to present by her CNM since symptoms have been persistent since Saturday. She denies NV, dysuria, or BRB.   Review of Systems  Positive: Pelvic cramping, spotting Negative: FCS  Physical Exam  BP (!) 155/99   Pulse 78   Temp 97.6 F (36.4 C) (Oral)   Resp 18   Ht 5\' 7"  (1.702 m)   LMP 02/20/2021 (Exact Date)   SpO2 99%   BMI 32.55 kg/m  Gen:   Awake, no distress  NAD Resp:  Normal effort CTA MSK:   Moves extremities without difficulty  ABD:  Soft, nontender.  No CVA tenderness  Medical Decision Making  Medically screening exam initiated at 3:46 PM.  Appropriate orders placed.  Sydney James was informed that the remainder of the evaluation will be completed by another provider, this initial triage assessment does not replace that evaluation, and the importance of remaining in the ED until their evaluation is complete.  Patient at 15 weeks states that presents to the ED for several days of pelvic cramping with some pink-tinged spotting noted when she wipes.  She denies any other symptoms at this time.   Sydney Prince, PA-C 06/10/21 1550

## 2021-06-10 NOTE — Telephone Encounter (Signed)
Relayed message to patient below pt confirmed and verbalized understanding. Pt will be going to ED.No other question or concerns.

## 2021-06-10 NOTE — Telephone Encounter (Signed)
I would recommend ED visit so she can get [place] ASAP

## 2021-06-10 NOTE — ED Triage Notes (Signed)
Patient to ER via POV, reports she was advised by her midwife to be evaluated due to pelvic pain that started on saturday. Patient is [redacted] weeks pregnant. Reports she feels a consistent deep ache inside of her pelvis. Denies vaginal bleeding. Reports that the last two times she has used the bathroom, there has been a pink tinge on the tissue.

## 2021-06-10 NOTE — ED Provider Notes (Signed)
[facility] Provider Note    Event Date/Time   First MD Initiated Contact with Patient 06/10/21 1753     (approximate)   History   Abdominal Cramping   HPI  Sydney James is a 26 y.o. female G8P3 currently [redacted] weeks pregnant who presents for evaluation of abdominal pain.  Patient reports that this has been ongoing for the past 5 days.  She reports that she has been lifting her 34-month-old and she is unsure if this is the cause of the problem.  She denies any nausea or vomiting.  She denies burning with urination.  She has noticed small amount of blood when she wipes after urinating.  She denies any dizziness or lightheadedness.     Physical Exam   Triage Vital Signs: ED Triage Vitals  Enc Vitals Group     BP 06/10/21 1540 (!) 155/99     Pulse Rate 06/10/21 1540 78     Resp 06/10/21 1540 18     Temp 06/10/21 1540 97.6 F (36.4 C)     Temp Source 06/10/21 1540 Oral     SpO2 06/10/21 1540 99 %     Weight 06/10/21 1759 207 lb 10.8 oz (94.2 kg)     Height 06/10/21 1540 5\' 7"  (1.702 m)     Head Circumference --      Peak Flow --      Pain Score 06/10/21 1540 8     Pain Loc --      Pain Edu? --      Excl. in GC? --     Most recent vital signs: Vitals:   06/10/21 1540 06/10/21 1800  BP: (!) 155/99 (!) 150/90  Pulse: 78 76  Resp: 18 18  Temp: 97.6 F (36.4 C)   SpO2: 99% 99%    Physical Exam Vitals and nursing note reviewed.  Constitutional:      General: Awake and alert. No acute distress.    Appearance: Normal appearance. She is well-developed and overweight.  HENT:     Head: Normocephalic and atraumatic.     Mouth: Mucous membranes are moist.  Eyes:     General: PERRL. Normal EOMs        Right eye: No discharge.        Left eye: No discharge.     Conjunctiva/sclera: Conjunctivae normal.  Cardiovascular:     Rate and Rhythm: Normal rate and regular rhythm.     Pulses: Normal pulses.     Heart sounds: Normal heart  sounds Pulmonary:     Effort: Pulmonary effort is normal. No respiratory distress.     Breath sounds: Normal breath sounds.  Abdominal:     Abdomen is soft. There is mild diffuse abdominal tenderness. No rebound or guarding. No distention. Musculoskeletal:        General: No swelling. Normal range of motion.     Cervical back: Normal range of motion and neck supple.  Lymphadenopathy:     Cervical: No cervical adenopathy.  Skin:    General: Skin is warm and dry.     Capillary Refill: Capillary refill takes less than 2 seconds.     Findings: No rash.  Neurological:     Mental Status: She is alert.      ED Results / Procedures / Treatments   Labs (all labs ordered are listed, but only abnormal results are displayed) Labs Reviewed  COMPREHENSIVE METABOLIC PANEL - Abnormal; Notable for the following components:  Result Value   Glucose, Bld 101 (*)    Albumin 3.3 (*)    Anion gap 3 (*)    All other components within normal limits  URINALYSIS, ROUTINE W REFLEX MICROSCOPIC - Abnormal; Notable for the following components:   Color, Urine STRAW (*)    APPearance CLEAR (*)    All other components within normal limits  HCG, QUANTITATIVE, PREGNANCY - Abnormal; Notable for the following components:   hCG, Beta Chain, Quant, S 29,589 (*)    All other components within normal limits  LIPASE, BLOOD  CBC  POC URINE PREG, ED     EKG     RADIOLOGY I independently reviewed and interpreted imaging and agree with radiologists findings.     PROCEDURES:  Critical Care performed:   Procedures   MEDICATIONS ORDERED IN ED: Medications - No data to display   IMPRESSION / MDM / ASSESSMENT AND PLAN / ED COURSE  I reviewed the triage vital signs and the nursing notes.   Differential diagnosis includes, but is not limited to, subchorionic hemorrhage, threatened/inevitable/spontaneous abortion, placental abnormality, round ligament pain, urinary tract infection,  preeclampsia  Patient has an elevated blood pressure on arrival which she reports was a problem with her previous pregnancies and also when she is not pregnant.  However, she has no proteinuria.  No edema, visual changes, altered mental status, or shortness of breath to suggest pulmonary edema.  Normal LFTs, normal platelets, not consistent with HELLP syndrome.  Ultrasound reveals a single live intrauterine pregnancy with anterior placenta without evidence of previa or abruption.  Cervix is closed.  Blood pressure, though elevated, is within the normal limits for pregnant women with chronic hypertension.  I recommended that she follow-up with her outpatient OB/GYN.  I reviewed the patient's history.  Blood work in the past has revealed that she is Rh positive. No need for rhogam.  No specific unilateral tenderness to suggest appendicitis, no anorexia or vomiting.  No pain with ambulation.  We did discuss strict return precautions for new, worsening, or change in symptoms.  I also advised that she follow-up with her OB/GYN this week especially for discussion about her blood pressure.  Patient understands and agrees with plan.  Discharged in stable condition.  Patient was discussed with and also seen by Dr. Fuller Plan who agrees with assessment and plan.  Patient's presentation is most consistent with acute complicated illness / injury requiring diagnostic workup.    FINAL CLINICAL IMPRESSION(S) / ED DIAGNOSES   Final diagnoses:  Pelvic pain in female  Abdominal pain during pregnancy in second trimester     Rx / DC Orders   ED Discharge Orders     None        Note:  This document was prepared using Dragon voice recognition software and may include unintentional dictation errors.   Keturah Shavers 06/10/21 2117    Concha Se, MD 06/12/21 [phone]

## 2021-06-22 ENCOUNTER — Other Ambulatory Visit: Payer: Self-pay | Admitting: Certified Nurse Midwife

## 2021-06-24 ENCOUNTER — Ambulatory Visit (INDEPENDENT_AMBULATORY_CARE_PROVIDER_SITE_OTHER): Admitting: Obstetrics

## 2021-06-24 VITALS — BP 157/92 | HR 81 | Wt 209.9 lb

## 2021-06-24 DIAGNOSIS — Z3A17 17 weeks gestation of pregnancy: Secondary | ICD-10-CM

## 2021-06-24 DIAGNOSIS — O10919 Unspecified pre-existing hypertension complicating pregnancy, unspecified trimester: Secondary | ICD-10-CM

## 2021-06-24 LAB — POCT URINALYSIS DIPSTICK OB
Bilirubin, UA: NEGATIVE
Blood, UA: NEGATIVE
Glucose, UA: NEGATIVE
Ketones, UA: NEGATIVE
Leukocytes, UA: NEGATIVE
Nitrite, UA: NEGATIVE
POC,PROTEIN,UA: NEGATIVE
Spec Grav, UA: <=1.005 — AB (ref 1.010–1.025)
Urobilinogen, UA: 0.2 E.U./dL
pH, UA: 6.5 (ref 5.0–8.0)

## 2021-06-24 MED ORDER — LABETALOL HCL 100 MG PO TABS: 100.0000 mg | ORAL_TABLET | Freq: Two times a day (BID) | ORAL | 6 refills | Status: DC

## 2021-06-24 NOTE — Progress Notes (Addendum)
[name] at [redacted]w[redacted]d. Continues to have deep uterine/pelvic floor pressure and discomfort. [place] in ED was normal. Sydney James is using an abdominal support belt and doing pelvic PT exercises. Elevated BP again today. Denies HA, blurry vision, and RUQ pain. DTR 2+.  She has been keeping a log at home. When she is at rest, BPs trend in the 130s/high 80s. With any activity, they are in the 140s-150s/90s. Consulted with Dr. Logan Bores about starting labetalol today. Savera is in agreement with this plan. Reviewed proper administration and potential side effects. Encouraged to continue keeping a log. Anatomy US in 2 weeks. [name] in 4 weeks.  Guadlupe Spanish, CNM

## 2021-06-25 LAB — PROTEIN / CREATININE RATIO, URINE
Creatinine, Urine: 71.1 mg/dL
Protein, Ur: 10.6 mg/dL
Protein/Creat Ratio: 149 mg/g creat (ref 0–200)

## 2021-06-28 ENCOUNTER — Telehealth: Payer: Self-pay | Admitting: Certified Nurse Midwife

## 2021-06-28 ENCOUNTER — Encounter

## 2021-07-05 ENCOUNTER — Encounter: Admitting: Certified Nurse Midwife

## 2021-07-13 ENCOUNTER — Inpatient Hospital Stay
Admission: EM | Admit: 2021-07-13 | Discharge: 2021-07-14 | DRG: 807 | Disposition: A | Discharge status: Home / Self Care | Attending: Obstetrics | Admitting: Obstetrics

## 2021-07-13 ENCOUNTER — Ambulatory Visit (INDEPENDENT_AMBULATORY_CARE_PROVIDER_SITE_OTHER): Admitting: Certified Nurse Midwife

## 2021-07-13 ENCOUNTER — Telehealth: Payer: Self-pay | Admitting: Certified Nurse Midwife

## 2021-07-13 ENCOUNTER — Other Ambulatory Visit: Payer: Self-pay

## 2021-07-13 ENCOUNTER — Encounter: Payer: Self-pay | Admitting: Obstetrics

## 2021-07-13 ENCOUNTER — Observation Stay

## 2021-07-13 DIAGNOSIS — Z3A20 20 weeks gestation of pregnancy: Secondary | ICD-10-CM

## 2021-07-13 DIAGNOSIS — O364XX0 Maternal care for intrauterine death, not applicable or unspecified: Secondary | ICD-10-CM | POA: Diagnosis present

## 2021-07-13 DIAGNOSIS — O039 Complete or unspecified spontaneous abortion without complication: Secondary | ICD-10-CM

## 2021-07-13 LAB — CBC
HCT: 39.5 % (ref 36.0–46.0)
Hemoglobin: 13.1 g/dL (ref 12.0–15.0)
MCH: 28.6 pg (ref 26.0–34.0)
MCHC: 33.2 g/dL (ref 30.0–36.0)
MCV: 86.2 fL (ref 80.0–100.0)
Platelets: 292 10*3/uL (ref 150–400)
RBC: 4.58 MIL/uL (ref 3.87–5.11)
RDW: 13 % (ref 11.5–15.5)
WBC: 10 10*3/uL (ref 4.0–10.5)
nRBC: 0 % (ref 0.0–0.2)

## 2021-07-13 LAB — PROTIME-INR
INR: 1.1 (ref 0.8–1.2)
Prothrombin Time: 13.6 seconds (ref 11.4–15.2)

## 2021-07-13 LAB — FIBRINOGEN: Fibrinogen: 357 mg/dL (ref 210–475)

## 2021-07-13 LAB — APTT: aPTT: 28 seconds (ref 24–36)

## 2021-07-13 MED ORDER — LACTATED RINGERS IV SOLN: INTRAVENOUS | Status: DC

## 2021-07-13 MED ORDER — ACETAMINOPHEN 325 MG PO TABS
650.0000 mg | ORAL_TABLET | ORAL | Status: DC | PRN
  Administered 2021-07-13: 650 mg via ORAL
  Filled 2021-07-13: qty 2

## 2021-07-13 MED ORDER — LIDOCAINE HCL (PF) 1 % IJ SOLN
30.0000 mL | INTRAMUSCULAR | Status: DC | PRN
  Filled 2021-07-13: qty 30

## 2021-07-13 MED ORDER — HYDROXYZINE HCL 50 MG PO TABS: 50.0000 mg | ORAL_TABLET | Freq: Four times a day (QID) | ORAL | Status: DC | PRN

## 2021-07-13 MED ORDER — SOD CITRATE-CITRIC ACID 500-334 MG/5ML PO SOLN: 30.0000 mL | ORAL | Status: DC | PRN

## 2021-07-13 MED ORDER — BUTORPHANOL TARTRATE 2 MG/ML IJ SOLN: 2.0000 mg | INTRAMUSCULAR | Status: DC | PRN

## 2021-07-13 MED ORDER — SODIUM CHLORIDE 0.9 % IV SOLN: 25.0000 mg | Freq: Four times a day (QID) | INTRAVENOUS | Status: DC | PRN

## 2021-07-13 MED ORDER — MISOPROSTOL 200 MCG PO TABS
400.0000 ug | ORAL_TABLET | ORAL | Status: DC
  Administered 2021-07-13 – 2021-07-14 (×2): 400 ug via VAGINAL
  Filled 2021-07-13 (×2): qty 2

## 2021-07-13 MED ORDER — ONDANSETRON HCL 4 MG/2ML IJ SOLN: 4.0000 mg | Freq: Four times a day (QID) | INTRAMUSCULAR | Status: DC | PRN

## 2021-07-13 NOTE — OB Triage Note (Addendum)
Pt is a 25yo [id number] at [redacted]w[redacted]d that was sent over from the office for Vaginal bleeding that started this afternoon. Pt states she had some dark red clots but the bleeding has turned bright red. Pt states she did not need to wear a pad because bleeding only happened when she wiped. CNM at bedside with doppler and [place] ordered.

## 2021-07-13 NOTE — Progress Notes (Signed)
Pt called early with c/o passing some tissue and vaginal bleeding  today when using the bathroom there is redish brown blood in color  on the toilet paper and chunks in the toilet. Patient states she has not felt baby move but that she has never felt baby move. Pt advised to come in for [facility] check. Was unable to get FHT with doppler.  There is not an u/s tech in the office. Pt sent to hospital for further evaluation.   Doreene Burke, CM

## 2021-07-13 NOTE — Telephone Encounter (Signed)
Patient called and states that she is [redacted] weeks pregnant. States today when using the bathroom there is redish brown blood in color  on the toilet paper and chunks in the toilet. Patient states she has not felt baby move. Pt states she has never felt movement from baby at all. Pt states she has had cramping but has always had cramping since beginning of pregnancy. Spoke with CMA, advised me to have the patient visit the ER. Please advise.

## 2021-07-13 NOTE — Progress Notes (Signed)
Pt transported downstairs to [place] via Wheelchair.

## 2021-07-13 NOTE — Progress Notes (Signed)
Discussed induction and delivery. Pt unsure if she wants pain medication but aware of options. Pt unsure if she would like to take baby home since she did take her other losses home. Pt unsure if she wants cremation. Discussed options. Pt and her husband are in good understanding of plan of care.

## 2021-07-13 NOTE — OB Triage Note (Signed)
LABOR & DELIVERY OB TRIAGE NOTE  SUBJECTIVE  HPI Sydney James is a 26 y.o. [id number] at [redacted]w[redacted]d who presents to Labor & Delivery for vaginal bleeding. She reports this afternoon she had some dark brown bleeding when she wiped. She denies cramping or pelvic pain. She was evaluated in the office, and they were not able to find FHTs. She has not yet felt fetal movement this pregnancy. She denies recent intercourse, vaginal odor, or s/s of infection.  OB History     Gravida  8   Para  3   Term  1   Preterm  2   AB  4   Living  3      SAB  3   IAB  0   Ectopic  0   Multiple  0   Live Births  3        Obstetric Comments  G6- missed AB at [redacted] weeks gestation         Scheduled Meds: Continuous Infusions: PRN Meds:.acetaminophen  OBJECTIVE  BP (!) 145/90 (BP Location: Right Arm)   Pulse 79   Temp 98.3 F (36.8 C) (Oral)   LMP 02/20/2021 (Exact Date)   Unable to find FHT with Doppler. Awaiting [place].  ASSESSMENT Impression  1) Pregnancy at [id number], [redacted]w[redacted]d, Estimated Date of Delivery: 11/27/21 2) Vaginal bleeding. Unable to find FHTs.  PLAN  1) Stat US ordered. Will make plan based on results.  Guadlupe Spanish, CNM

## 2021-07-13 NOTE — H&P (Cosign Needed Addendum)
History and Physical   HPI  Sydney James is a 26 y.o. [id number] at [redacted]w[redacted]d Estimated Date of Delivery: 11/27/21 who is being admitted for induction of labor for fetal demise. She reported vaginal bleeding that started this afternoon. [place] confirmed fetal demise. After discussion of options, Fatina and her husband Clifton Custard would like to proceed with induction of labor now.   OB History  OB History  Gravida Para Term Preterm AB Living  8 3 1 2 4 3   SAB IAB Ectopic Multiple Live Births  3 0 0 0 3    # Outcome Date GA Lbr Len/2nd Weight Sex Delivery Anes PTL Lv  8 Current           7 Term 10/04/20 [redacted]w[redacted]d / 00:18 3120 g M Vag-Spont EPI  LIV     Name: CANTRELL, MARTUS     Apgar1: 7  Apgar5: 9  6 AB 07/18/19 [redacted]w[redacted]d    SAB     5 Preterm 12/22/17 [redacted]w[redacted]d  2080 g M CS-LTranv EPI, Spinal  LIV     Name: Schedler,PENDINGBABY     Apgar1: 8  Apgar5: 8  4 SAB 09/2016          3 SAB 2018          2 Preterm 01/25/15 [redacted]w[redacted]d 03:30 / 01:45 2840 g M Vag-Spont EPI  LIV     Name: XXXBENNETT,BOY Markesia     Apgar1: 8  Apgar5: 9  1 SAB 2011            Obstetric Comments  G6- missed AB at [redacted] weeks gestation    PROBLEM LIST  Pregnancy complications or risks: Patient Active Problem List   Diagnosis Date Noted   Labor and delivery, indication for care 07/13/2021   Fetal demise due to miscarriage 07/13/2021   DDD (degenerative disc disease), lumbar 11/30/2019   History of migraine headaches 11/30/2019   Essential hypertension 09/05/2019   Sleep disturbance 09/05/2019   Obesity 02/27/2019   Vitamin D deficiency 11/02/2016    Prenatal labs and studies: ABO, Rh: --/--/O POS (07/11 2032) Antibody: NEG (07/11 2032) Rubella: 1.59 (05/12 1018) RPR: Non Reactive (05/12 1018)  HBsAg: Negative (05/12 1018)  HIV: Non Reactive (05/12 1018)  03-31-1992-- (09/27 1705)   Past Medical History:  Diagnosis Date   Lumbar herniated disc    Migraine    Preeclampsia    PVC's (premature ventricular  contractions)    Raynaud's disease    Scoliosis    Back brace for 2 years   Scoliosis      Past Surgical History:  Procedure Laterality Date   CESAREAN SECTION N/A 12/22/2017   Procedure: CESAREAN SECTION;  Surgeon: 12/24/2017, MD;  Location: [facility] ORS;  Service: Obstetrics;  Laterality: N/A;  Female Born @ 68     DILATION AND CURETTAGE OF UTERUS     DILATION AND EVACUATION N/A 03/15/2016   Procedure: DILATATION AND EVACUATION;  Surgeon: 03/17/2016, MD;  Location: [facility] OR[facility];  Service: Gynecology;  Laterality: N/A;   DILATION AND EVACUATION N/A 07/18/2019   Procedure: DILATATION AND EVACUATION;  Surgeon: 07/20/2019, MD;  Location: [facility] ORS;  Service: Gynecology;  Laterality: N/A;   WISDOM TOOTH EXTRACTION       Medications    Current Discharge Medication List     CONTINUE these medications which have NOT CHANGED   Details  aspirin EC 81 MG tablet Take 2 tablets (162 mg total) by mouth daily. Swallow whole. Qty: 30 tablet,  Refills: 12    ondansetron (ZOFRAN) 4 MG tablet TAKE 1 TABLET(4 MG) BY MOUTH EVERY 8 HOURS AS NEEDED FOR NAUSEA OR VOMITING Qty: 20 tablet, Refills: 0    Prenatal Vit-Fe Fumarate-FA (MULTIVITAMIN-PRENATAL) 27-0.8 MG TABS tablet Take 1 tablet by mouth daily at 12 noon.    fluconazole (DIFLUCAN) 150 MG tablet Take 1 tablet (150 mg total) by mouth daily. Qty: 1 tablet, Refills: 0    labetalol (NORMODYNE) 100 MG tablet Take 1 tablet (100 mg total) by mouth 2 (two) times daily. Qty: 60 tablet, Refills: 6         Allergies  Aloe, Lubricants, Monistat [miconazole], Sulfa antibiotics, and Tape  Review of Systems  Pertinent items noted in HPI and remainder of comprehensive ROS otherwise negative.  Physical Exam  BP (!) 142/94   Pulse 80   Temp 98.2 F (36.8 C) (Oral)   Resp 16   Ht 5\' 7"  (1.702 m)   Wt 96 kg   LMP 02/20/2021 (Exact Date)   BMI 33.15 kg/m   Lungs:  CTA B Cardio: RRR without M/R/G Abd: Soft, gravid,  NT Presentation: cephalic per 02/22/2021  DTRs: +1 CERVIX: closed/thick/high   See Prenatal records for more detailed PE.     Test Results  Results for orders placed or performed during the hospital encounter of 07/13/21 (from the past 24 hour(s))  CBC     Status: None   Collection Time: 07/13/21  8:32 PM  Result Value Ref Range   WBC 10.0 4.0 - 10.5 K/uL   RBC 4.58 3.87 - 5.11 MIL/uL   Hemoglobin 13.1 12.0 - 15.0 g/dL   HCT 09/13/21 53.7 - 48.2 %   MCV 86.2 80.0 - 100.0 fL   MCH 28.6 26.0 - 34.0 pg   MCHC 33.2 30.0 - 36.0 g/dL   RDW 70.7 86.7 - 54.4 %   Platelets 292 150 - 400 K/uL   nRBC 0.0 0.0 - 0.2 %  Type and screen [facility]     Status: None   Collection Time: 07/13/21  8:32 PM  Result Value Ref Range   ABO/RH(D) O POS    Antibody Screen NEG    Sample Expiration      07/16/2021,2359 Performed at [facility], [address] [place], [place]   Protime-INR     Status: None   Collection Time: 07/13/21  8:32 PM  Result Value Ref Range   Prothrombin Time 13.6 11.4 - 15.2 seconds   INR 1.1 0.8 - 1.2  APTT     Status: None   Collection Time: 07/13/21  8:32 PM  Result Value Ref Range   aPTT 28 24 - 36 seconds  Fibrinogen     Status: None   Collection Time: 07/13/21  8:32 PM  Result Value Ref Range   Fibrinogen 357 210 - 475 mg/dL     Assessment   09/13/21 at [redacted]w[redacted]d Estimated Date of Delivery: 11/27/21 with interval fetal demise.  Patient Active Problem List   Diagnosis Date Noted   Labor and delivery, indication for care 07/13/2021   Fetal demise due to miscarriage 07/13/2021   DDD (degenerative disc disease), lumbar 11/30/2019   History of migraine headaches 11/30/2019   Essential hypertension 09/05/2019   Sleep disturbance 09/05/2019   Obesity 02/27/2019   Vitamin D deficiency 11/02/2016    Plan  1. Admit to L&D for induction of labor with misoprostol 2. EFM: Toco only 3. Pharmacologic pain relief if desired.  4.  Admission labs  5. Anticipate NSVD 6. Plan discussed with Dr. Geralynn Ochs, CNM 07/13/2021 10:29 PM

## 2021-07-14 ENCOUNTER — Encounter: Payer: Self-pay | Admitting: Obstetrics

## 2021-07-14 ENCOUNTER — Encounter: Admitting: Certified Nurse Midwife

## 2021-07-14 ENCOUNTER — Other Ambulatory Visit

## 2021-07-14 ENCOUNTER — Ambulatory Visit

## 2021-07-14 DIAGNOSIS — Z3A20 20 weeks gestation of pregnancy: Secondary | ICD-10-CM

## 2021-07-14 DIAGNOSIS — O364XX0 Maternal care for intrauterine death, not applicable or unspecified: Secondary | ICD-10-CM

## 2021-07-14 LAB — RPR: RPR Ser Ql: NONREACTIVE

## 2021-07-14 MED ORDER — TETANUS-DIPHTH-ACELL PERTUSSIS 5-2.5-18.5 LF-MCG/0.5 IM SUSY
0.5000 mL | PREFILLED_SYRINGE | Freq: Once | INTRAMUSCULAR | Status: DC
  Filled 2021-07-14: qty 0.5

## 2021-07-14 MED ORDER — IBUPROFEN 600 MG PO TABS: 600.0000 mg | ORAL_TABLET | Freq: Four times a day (QID) | ORAL | Status: DC

## 2021-07-14 MED ORDER — OXYTOCIN-SODIUM CHLORIDE 30-0.9 UT/500ML-% IV SOLN
2.5000 [IU]/h | INTRAVENOUS | Status: DC | PRN
  Administered 2021-07-14: 2.5 [IU]/h via INTRAVENOUS

## 2021-07-14 MED ORDER — ACETAMINOPHEN 325 MG PO TABS: 650.0000 mg | ORAL_TABLET | ORAL | Status: DC | PRN

## 2021-07-14 MED ORDER — DOCUSATE SODIUM 100 MG PO CAPS: 100.0000 mg | ORAL_CAPSULE | Freq: Two times a day (BID) | ORAL | Status: DC

## 2021-07-14 MED ORDER — BENZOCAINE-MENTHOL 20-0.5 % EX AERO: 1.0000 | INHALATION_SPRAY | CUTANEOUS | Status: DC | PRN

## 2021-07-14 MED ORDER — ZOLPIDEM TARTRATE 5 MG PO TABS: 5.0000 mg | ORAL_TABLET | Freq: Every evening | ORAL | Status: DC | PRN

## 2021-07-14 MED ORDER — DIPHENHYDRAMINE HCL 25 MG PO CAPS: 25.0000 mg | ORAL_CAPSULE | Freq: Four times a day (QID) | ORAL | Status: DC | PRN

## 2021-07-14 MED ORDER — OXYTOCIN-SODIUM CHLORIDE 30-0.9 UT/500ML-% IV SOLN
INTRAVENOUS | Status: AC
  Filled 2021-07-14: qty 500

## 2021-07-14 MED ORDER — OXYCODONE-ACETAMINOPHEN 5-325 MG PO TABS: 1.0000 | ORAL_TABLET | ORAL | Status: DC | PRN

## 2021-07-14 MED ORDER — SIMETHICONE 80 MG PO CHEW: 80.0000 mg | CHEWABLE_TABLET | ORAL | Status: DC | PRN

## 2021-07-14 NOTE — Discharge Summary (Signed)
Postpartum Discharge Summary  Date of Service updated: 07/14/2021     Patient Name: Sydney James DOB: Jul 24, 1995 MRN: [id number]  Date of admission: 07/13/2021 Delivery date:07/14/2021  Delivering provider: Lurlean Horns  Date of discharge: 07/14/2021  Admitting diagnosis: Labor and delivery, indication for care [O75.9] Intrauterine pregnancy: [redacted]w[redacted]d    Secondary diagnosis:  Active Problems:   Labor and delivery, indication for care   Fetal demise due to miscarriage  Additional problems:     Discharge diagnosis: Preterm Pregnancy Delivered                                              Post partum procedures: none Augmentation[facility] Cytotec [facility]   26y.o. yo [phone] admitted to the hospital 07/13/2021 for induction of labor.  Indication for induction:  IUFD .  Patient had an uncomplicated labor course as follows: Membrane Rupture Time/Date:  ,  [id number] Method:Vaginal, Spontaneous  Episiotomy: none Lacerations: none Details of delivery can be found in separate delivery note.    Patient had a routine postpartum course. She reports feeling physically well. She has a good support system. Patient is discharged home 07/14/21.  Newborn Data: Birth date:07/14/2021  Birth time:2:48 AM  Gender: female Living status:Fetal Demise  Apgars:0, 0 Weight:70 g   Magnesium Sulfate received: No BMZ received: No Rhophylac:N/A MMR:N/A T-DaP: last administered 2022 Flu: N/A Transfusion:No  Physical exam  Vitals:   07/14/21 0257 07/14/21 0442 07/14/21 0627 07/14/21 0736  BP: (!) 154/88 (!) 148/86 129/73 120/71  Pulse: 76 76 67 66  Resp:  _0 Temp:  98.8 F (37.1 C)  98.6 F (37 C)  TempSrc:  Oral  Oral  Weight:      Height:       General: alert, cooperative, and no distress Lochia: appropriate Uterine Fundus: firm Incision: N/A DVT Evaluation: No evidence of DVT seen on  physical exam. Labs: Lab Results  Component Value Date   WBC 10.0 07/13/2021   HGB 13.1 07/13/2021   HCT 39.5 07/13/2021   MCV 86.2 07/13/2021   PLT 292 07/13/2021      Latest Ref Rng & Units 06/10/2021    3:42 PM  CMP  Glucose 70 - 99 mg/dL 101   BUN 6 - 20 mg/dL 6   Creatinine 0.44 - 1.00 mg/dL 0.49   Sodium 135 - 145 mmol/L 139   Potassium 3.5 - 5.1 mmol/L 3.8   Chloride 98 - 111 mmol/L 109   CO2 22 - 32 mmol/L 27   Calcium 8.9 - 10.3 mg/dL 9.9   Total Protein 6.5 - 8.1 g/dL 6.8   Total Bilirubin 0.3 - 1.2 mg/dL 0.3   Alkaline Phos 38 - 126 U/L 66   AST 15 - 41 U/L 21   ALT 0 - 44 U/L 22    Edinburgh Score:    07/14/2021    7:36 AM  Edinburgh Postnatal Depression Scale Screening Tool  I have been able to laugh and see the funny side of things. 0  I have looked forward with enjoyment to things. 0  I have blamed myself unnecessarily when things went wrong. 1  I have been anxious or worried for no good reason. 2  I have felt scared or panicky for no  good reason. 2  Things have been getting on top of me. 1  I have been so unhappy that I have had difficulty sleeping. 1  I have felt sad or miserable. 1  I have been so unhappy that I have been crying. 0  The thought of harming myself has occurred to me. 0  Edinburgh Postnatal Depression Scale Total 8      After visit meds:  Allergies as of 07/14/2021       Reactions   Aloe Hives   Lubricants    Has to have water based lubricant. Allergy to non-water based lubricant   Monistat [miconazole]    Sulfa Antibiotics Itching, Rash   Tape Itching, Rash        Medication List     STOP taking these medications    aspirin EC 81 MG tablet   fluconazole 150 MG tablet Commonly known as: Diflucan   labetalol 100 MG tablet Commonly known as: NORMODYNE   ondansetron 4 MG tablet Commonly known as: ZOFRAN       TAKE these medications    multivitamin-prenatal 27-0.8 MG Tabs tablet Take 1 tablet by mouth daily at  12 noon.         Discharge home in stable condition Infant Feeding:  N/A Infant [place] Discharge instruction: per After Visit Summary and Postpartum booklet. Activity: Advance as tolerated. Pelvic rest for 6 weeks.  Diet: routine diet Anticipated Birth Control: Depo Postpartum Appointment:2 weeks Additional Postpartum F/U:  6 weeks Future Appointments: Future Appointments  Date Time Provider [place]  07/14/2021 11:00 AM OP[facility]-US OPIC-US OPIC-Outpati   Follow up Visit:  Follow-up Information     Lurlean Horns, CNM Follow up in 2 week(s).   Specialty: Obstetrics Contact information: [address] [place] [address] [phone]                  07/14/2021 Rod Can, CNM

## 2021-07-14 NOTE — Progress Notes (Signed)
Delivered fetus

## 2021-07-14 NOTE — Progress Notes (Signed)
Patient Discharged home per provider. Pt educated about postpartum follow up care. Pt instructed to keep all follow up appointments with her provider. AVS given to patient and RN answered all questions and patient has no further questions at this time. Pt discharged home in stable condition with significant other.

## 2021-07-15 LAB — TYPE AND SCREEN
ABO/RH(D): O POS
Antibody Screen: NEGATIVE

## 2021-07-15 LAB — SURGICAL PATHOLOGY

## 2021-08-10 ENCOUNTER — Ambulatory Visit (INDEPENDENT_AMBULATORY_CARE_PROVIDER_SITE_OTHER): Admitting: Certified Nurse Midwife

## 2021-08-10 ENCOUNTER — Encounter: Payer: Self-pay | Admitting: Certified Nurse Midwife

## 2021-08-10 NOTE — Progress Notes (Signed)
Subjective:    Sydney James is a 26 y.o. [phone] Caucasian female who presents for a postpartum visit. She is 6 weeks postpartum following a spontaneous vaginal delivery IUFD at 20.4 gestational weeks. Anesthesia: none. I have fully reviewed the prenatal and intrapartum course. Postpartum course has been normal. Bleeding no bleeding. Bowel function is normal. Bladder function is normal. Patient is sexually active. Last sexual activity: a week ago. Contraception method is condoms. Postpartum depression screening: positive. Score 15.  Last pap 04/08/2020 and was normal.  The following portions of the patient's history were reviewed and updated as appropriate: allergies, current medications, past medical history, past surgical history and problem list.  Review of Systems Pertinent items are noted in HPI.   There were no vitals filed for this visit. Patient's last menstrual period was 02/20/2021 (exact date).  Objective:   General:  alert, cooperative and no distress   Breasts:  deferred, no complaints  Lungs: clear to auscultation bilaterally  Heart:  regular rate and rhythm  Abdomen: soft, nontender   Vulva:  deferred  Vagina: derferred  Cervix:  deferred  Corpus: deferred  Adnexa:  deferred  Rectal Exam: No  hemorrhoids        Assessment:   Postpartum exam 6 wks s/p vaginal delivery IUFD Depression screening  Contraception counseling   Plan:  : condoms, declines hormonal options at this time. Discussed ppd screening. She is seeing a Camera operator . She declines medication to help her mood at this time . States that she has started exercising again which has helped. She will reach out to me should she decided to utilize medications. She states that she is irritable and has no patience. That she is pessimistic and that she did not use to be that way. She will continue with the counselor and is adding regular exercise .   Follow up prn  for  or earlier if needed  Doreene Burke,  CNM

## 2021-09-08 ENCOUNTER — Encounter: Payer: Self-pay | Admitting: Family

## 2021-09-08 ENCOUNTER — Ambulatory Visit (INDEPENDENT_AMBULATORY_CARE_PROVIDER_SITE_OTHER): Admitting: Family

## 2021-09-08 VITALS — BP 130/80 | HR 81 | Temp 98.0°F | Ht 67.0 in | Wt 213.6 lb

## 2021-09-08 DIAGNOSIS — I10 Essential (primary) hypertension: Secondary | ICD-10-CM

## 2021-09-08 DIAGNOSIS — R635 Abnormal weight gain: Secondary | ICD-10-CM

## 2021-09-08 DIAGNOSIS — R42 Dizziness and giddiness: Secondary | ICD-10-CM

## 2021-09-08 MED ORDER — LABETALOL HCL 100 MG PO TABS: 100.0000 mg | ORAL_TABLET | Freq: Two times a day (BID) | ORAL | 2 refills | Status: DC

## 2021-09-08 NOTE — Progress Notes (Signed)
Subjective:    Patient ID: Sydney James, female    DOB: [birth year], 26 y.o.   MRN: [id number]  CC: Sydney James is a 26 y.o. female who presents today for an acute visit.    HPI: Recent elevation in blood pressure.    Elevated BP for 7 years.  BP would get better in times when she obtain prepregnancy weight.  During pregnancy  blood pressure would often escalate.  She will have episodic dizziness when outside 'and hot' in particular.   She also has episodes and also when standing.She describes a 'tea cloth' over her eyes almost whiteness. No vision loss. No associated anxiety.   She endorses HA which is 'annoying'. She will feet hot , sweaty.    Eye exam is UTD 01/2021 per patient. She wears glasses.   H/o migraine.   NO nsaid, sudafed.   No cp, sob, syncope, vision loss.   Had stillborn 7 weeks ago. She is working out daily, eating healthy. She has lost 5 lbs.   No h/o DM  H/o  pre clampsia.   3 boys   Previously on amlodipine prior to pregnancy. Labetolol during pregnancy.   Family history of hypertension HISTORY:  Past Medical History:  Diagnosis Date   Lumbar herniated disc    Migraine    Preeclampsia    PVC's (premature ventricular contractions)    Raynaud's disease    Scoliosis    Back brace for 2 years   Scoliosis    Past Surgical History:  Procedure Laterality Date   CESAREAN SECTION N/A 12/22/2017   Procedure: CESAREAN SECTION;  Surgeon: Sydney Collin, MD;  Location: [facility] ORS;  Service: Obstetrics;  Laterality: N/A;  Female Born @ 46     DILATION AND CURETTAGE OF UTERUS     DILATION AND EVACUATION N/A 03/15/2016   Procedure: DILATATION AND EVACUATION;  Surgeon: Sydney Harms, MD;  Location: [facility] ORS;  Service: Gynecology;  Laterality: N/A;   DILATION AND EVACUATION N/A 07/18/2019   Procedure: DILATATION AND EVACUATION;  Surgeon: Sydney Laser, MD;  Location: [facility] ORS;  Service: Gynecology;  Laterality: N/A;   WISDOM TOOTH  EXTRACTION     Family History  Problem Relation Age of Onset   Hypertension Father    Hypertension Mother    Early death Mother    Cancer Maternal Grandmother    Cancer Maternal Grandfather    Diabetes Maternal Grandfather    Cancer Paternal Grandmother    Diabetes Paternal Grandfather     Allergies: Aloe, Lubricants, Monistat [miconazole], Sulfa antibiotics, and Tape Current Outpatient Medications on File Prior to Visit  Medication Sig Dispense Refill   Prenatal Vit-Fe Fumarate-FA (MULTIVITAMIN-PRENATAL) 27-0.8 MG TABS tablet Take 1 tablet by mouth daily at 12 noon.     No current facility-administered medications on file prior to visit.    Social History   Tobacco Use   Smoking status: Never   Smokeless tobacco: Never  Vaping Use   Vaping Use: Never used  Substance Use Topics   Alcohol use: No   Drug use: No    Review of Systems  Constitutional:  Negative for chills and fever.  Eyes:  Positive for visual disturbance.  Respiratory:  Negative for cough and shortness of breath.   Cardiovascular:  Negative for chest pain and palpitations.  Gastrointestinal:  Negative for nausea and vomiting.  Neurological:  Positive for dizziness. Negative for syncope.  Psychiatric/Behavioral:  The patient is not nervous/anxious.       Objective:  BP 130/80 (BP Location: Left Arm, Patient Position: Sitting, Cuff Size: Normal)   Pulse 81   Temp 98 F (36.7 C) (Oral)   Ht 5\' 7"  (1.702 m)   Wt 213 lb 9.6 oz (96.9 kg)   LMP  (LMP Unknown)   SpO2 98%   BMI 33.45 kg/m    Physical Exam Vitals reviewed.  Constitutional:      Appearance: She is well-developed.  HENT:     Mouth/Throat:     Pharynx: Uvula midline.  Eyes:     Conjunctiva/sclera: Conjunctivae normal.     Pupils: Pupils are equal, round, and reactive to light.     Comments: Fundus normal bilaterally.   Cardiovascular:     Rate and Rhythm: Normal rate and regular rhythm.     Pulses: Normal pulses.     Heart  sounds: Normal heart sounds.  Pulmonary:     Effort: Pulmonary effort is normal.     Breath sounds: Normal breath sounds. No wheezing, rhonchi or rales.  Skin:    General: Skin is warm and dry.  Neurological:     Mental Status: She is alert.     Cranial Nerves: No cranial nerve deficit.     Sensory: No sensory deficit.     Deep Tendon Reflexes:     Reflex Scores:      Bicep reflexes are 2+ on the right side and 2+ on the left side.      Patellar reflexes are 2+ on the right side and 2+ on the left side.    Comments: Grip equal and strong bilateral upper extremities. Gait strong and steady. Able to perform rapid alternating movement without difficulty.   Psychiatric:        Speech: Speech normal.        Behavior: Behavior normal.        Thought Content: Thought content normal.        Assessment & Plan:   Problem List Items Addressed This Visit       Cardiovascular and Mediastinum   Essential hypertension - Primary    Elevated.  Suspect weight gain with recent pregnancy may have contributed.  Patient has a history of preeclampsia.  Patient is not on birth control and may consider expanding her family in the future.  We opted for labetalol 100mg  bid as previously effective for her and safe in pregnancy. Pending ultrasound evaluate for renal artery stenosis.  she will let me know how she is doing      Relevant Medications   labetalol (NORMODYNE) 100 MG tablet   Other Relevant Orders   Renal Artery Stenosis     Other   Dizziness    While examining patient today, she described a dizzy sensation while in the room.  She felt her vision change and described vision with 'cheesecloth' veil over her vision.  No vision loss.  Reassuring neurologic exam.  I helped her get on the exam table to rest and provide her with water. orthostatic vitals were performed.  She was not orthostatic.  After a period of a couple minutes, symptoms completely resolved and patient felt fine.  Eye exam UTD.  She declines neuroimaging at this time.  We jointly agreed continue close vigilance and any worsening or changes in symptoms advised patient let me know.  We will reassess after she starts labetalol.  Advised her to consider repeat eye exam      Weight gain   Relevant Orders   TSH  I am having Sydney James start on labetalol. I am also having her maintain her multivitamin-prenatal.   Meds ordered this encounter  Medications   labetalol (NORMODYNE) 100 MG tablet    Sig: Take 1 tablet (100 mg total) by mouth 2 (two) times daily.    Dispense:  120 tablet    Refill:  2    Order Specific Question:   Supervising Provider    Answer:   Sherlene Shams [[id number]]    Return precautions given.   Risks, benefits, and alternatives of the medications and treatment plan prescribed today were discussed, and patient expressed understanding.   Education regarding symptom management and diagnosis given to patient on AVS.  Continue to follow with Dale [place], MD for routine health maintenance.   Sydney James and I agreed with plan.   Rennie Plowman, FNP

## 2021-09-08 NOTE — Assessment & Plan Note (Addendum)
Elevated.  Suspect weight gain with recent pregnancy may have contributed.  Patient has a history of preeclampsia.  Patient is not on birth control and may consider expanding her family in the future.  We opted for labetalol 100mg  bid as previously effective for her and safe in pregnancy. Pending ultrasound evaluate for renal artery stenosis.  she will let me know how she is doing

## 2021-09-08 NOTE — Addendum Note (Signed)
Addended by: Warden Fillers on: 09/08/2021 12:05 PM   Modules accepted: Orders

## 2021-09-08 NOTE — Assessment & Plan Note (Addendum)
While examining patient today, she described a dizzy sensation while in the room.  She felt her vision change and described vision with 'cheesecloth' veil over her vision.  No vision loss.  Reassuring neurologic exam.  I helped her get on the exam table to rest and provide her with water. orthostatic vitals were performed.  She was not orthostatic.  After a period of a couple minutes, symptoms completely resolved and patient felt fine.  Eye exam UTD. She declines neuroimaging at this time.  We jointly agreed continue close vigilance and any worsening or changes in symptoms advised patient let me know.  We will reassess after she starts labetalol.  Advised her to consider repeat eye exam

## 2021-09-08 NOTE — Patient Instructions (Addendum)
I ordered renal artery ultrasound for thoroughness  Start labetolol  Consider metformin in the future.   Please ensure you are drinking plenty water and most certainly if dizziness or vision changes persist/recur, please call let me know right away as I like to order neuroimaging.  Please consider having an updated eye exam as well

## 2021-09-10 ENCOUNTER — Encounter: Payer: Self-pay | Admitting: Family

## 2021-09-10 ENCOUNTER — Other Ambulatory Visit: Payer: Self-pay | Admitting: Family

## 2021-09-10 DIAGNOSIS — E669 Obesity, unspecified: Secondary | ICD-10-CM

## 2021-09-10 MED ORDER — METFORMIN HCL ER 500 MG PO TB24: 500.0000 mg | ORAL_TABLET | Freq: Every evening | ORAL | 2 refills | Status: DC

## 2021-09-10 NOTE — Progress Notes (Signed)
close

## 2021-09-10 NOTE — Telephone Encounter (Signed)
Okay to fill Metformin last ov 09/08/21

## 2021-09-13 ENCOUNTER — Ambulatory Visit: Admitting: Family

## 2021-09-27 ENCOUNTER — Ambulatory Visit
Admission: RE | Admit: 2021-09-27 | Discharge: 2021-09-27 | Disposition: A | Discharge status: Home / Self Care | Source: Ambulatory Visit | Attending: Internal Medicine | Admitting: Internal Medicine

## 2021-09-27 ENCOUNTER — Encounter: Payer: Self-pay | Admitting: Internal Medicine

## 2021-09-27 ENCOUNTER — Ambulatory Visit (INDEPENDENT_AMBULATORY_CARE_PROVIDER_SITE_OTHER): Admitting: Internal Medicine

## 2021-09-27 DIAGNOSIS — M25561 Pain in right knee: Secondary | ICD-10-CM | POA: Diagnosis present

## 2021-09-27 DIAGNOSIS — M25562 Pain in left knee: Secondary | ICD-10-CM | POA: Insufficient documentation

## 2021-09-27 DIAGNOSIS — I10 Essential (primary) hypertension: Secondary | ICD-10-CM

## 2021-09-27 DIAGNOSIS — Z8669 Personal history of other diseases of the nervous system and sense organs: Secondary | ICD-10-CM | POA: Diagnosis not present

## 2021-09-27 NOTE — Patient Instructions (Signed)
Magnesium glycinate daily 

## 2021-09-27 NOTE — Progress Notes (Signed)
Patient ID: Sydney James, female   DOB: [birth year], 26 y.o.   MRN: [id number]   Subjective:    Patient ID: Sydney James, female    DOB: 06-18-1995, 26 y.o.   MRN: [id number]   Patient here for a scheduled follow up.   Chief Complaint  Patient presents with   Follow-up    2 week follow up   .   HPI Having issues recently with elevated blood pressure.  Saw Margaret.  Treated with labetaolol.  Recent pregnancy.  Also reported at that visit - dizziness and vision change.  No vision loss.  Since starting on her blood pressure medication, blood pressure doing better.  The symptoms of nausea, vision change and light headedness - better.  Still with headaches.  Takes tylenol pm.  Typical headache - left side to ears.  Overall improved.  Due to get eyes checked 10/05/21.  No chest pain or sob reported.  No abdominal pain reported.  Has had bilateral knee issues - Left > right.  Persistent now for two years.  Increased pain.  Limits activity.     Past Medical History:  Diagnosis Date   Lumbar herniated disc    Migraine    Preeclampsia    PVC's (premature ventricular contractions)    Raynaud's disease    Scoliosis    Back brace for 2 years   Scoliosis    Past Surgical History:  Procedure Laterality Date   CESAREAN SECTION N/A 12/22/2017   Procedure: CESAREAN SECTION;  Surgeon: Harlin Heys, MD;  Location: [facility];  Service: Obstetrics;  Laterality: N/A;  Female Born @ [place] N/A 03/15/2016   Procedure: DILATATION AND EVACUATION;  Surgeon: Brayton Mars, MD;  Location: [facility];  Service: Gynecology;  Laterality: N/A;   DILATION AND EVACUATION N/A 07/18/2019   Procedure: DILATATION AND EVACUATION;  Surgeon: Rubie Maid, MD;  Location: [facility];  Service: Gynecology;  Laterality: N/A;   WISDOM TOOTH EXTRACTION     Family History  Problem Relation Age of Onset   Hypertension Father    Hypertension Mother     Early death Mother    Cancer Maternal Grandmother    Cancer Maternal Grandfather    Diabetes Maternal Grandfather    Cancer Paternal Grandmother    Diabetes Paternal Grandfather    Social History   Socioeconomic History   Marital status: Married    Spouse name: Analei Dorcely   Number of children: Not on file   Years of education: Not on file   Highest education level: Not on file  Occupational History   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  Tobacco Use   Smoking status: Never   Smokeless tobacco: Never  Vaping Use   Vaping Use: Never used  Substance and Sexual Activity   Alcohol use: No   Drug use: No   Sexual activity: Not Currently  Other Topics Concern   Not on file  Social History Narrative   Not on file   Social Determinants of Health   Financial Resource Strain: Low Risk  (12/19/2017)   Overall Financial Resource Strain (CARDIA)    Difficulty of Paying Living Expenses: Not hard at all  Food Insecurity: No Food Insecurity (12/19/2017)   Hunger Vital Sign    Worried About Running Out of Food in the Last Year: Never true    Ran Out of Food in the Last Year: Never true  Transportation Needs: No  Transportation Needs (12/19/2017)   PRAPARE - Hydrologist (Medical): No    Lack of Transportation (Non-Medical): No  Physical Activity: Inactive (12/19/2017)   Exercise Vital Sign    Days of Exercise per Week: 0 days    Minutes of Exercise per Session: 0 min  Stress: No Stress Concern Present (12/19/2017)   [place]    Feeling of Stress : Not at all  Social Connections: Moderately Integrated (12/19/2017)   Social Connection and Isolation Panel [NHANES]    Frequency of Communication with Friends and Family: More than three times a week    Frequency of Social Gatherings with Friends and Family: More than three times a week    Attends Religious Services: Never    Marine scientist  or Organizations: Yes    Attends Music therapist: More than 4 times per year    Marital Status: Married     Review of Systems  Constitutional:  Negative for appetite change and unexpected weight change.  HENT:  Negative for congestion and sinus pressure.   Respiratory:  Negative for cough, chest tightness and shortness of breath.   Cardiovascular:  Negative for chest pain, palpitations and leg swelling.  Gastrointestinal:  Negative for abdominal pain, diarrhea and vomiting.  Genitourinary:  Negative for difficulty urinating and dysuria.  Musculoskeletal:  Negative for myalgias.       Knee pain as outlined.   Skin:  Negative for color change and rash.  Neurological:  Positive for headaches. Negative for dizziness.  Psychiatric/Behavioral:  Negative for agitation and dysphoric mood.        Increased stress as outlined.        Objective:     BP 120/78 (BP Location: Left Arm, Patient Position: Sitting, Cuff Size: Normal)   Pulse 68   Temp 98.7 F (37.1 C) (Oral)   Ht 5\' 7"  (1.702 m)   Wt 213 lb 3.2 oz (96.7 kg)   LMP  (LMP Unknown)   SpO2 99%   BMI 33.39 kg/m  Wt Readings from Last 3 Encounters:  09/27/21 213 lb 3.2 oz (96.7 kg)  09/08/21 213 lb 9.6 oz (96.9 kg)  08/10/21 211 lb 3.2 oz (95.8 kg)    Physical Exam Vitals reviewed.  Constitutional:      General: She is not in acute distress.    Appearance: Normal appearance.  HENT:     Head: Normocephalic and atraumatic.     Right Ear: External ear normal.     Left Ear: External ear normal.  Eyes:     General: No scleral icterus.       Right eye: No discharge.        Left eye: No discharge.     Conjunctiva/sclera: Conjunctivae normal.  Neck:     Thyroid: No thyromegaly.  Cardiovascular:     Rate and Rhythm: Normal rate and regular rhythm.  Pulmonary:     Effort: No respiratory distress.     Breath sounds: Normal breath sounds. No wheezing.  Abdominal:     General: Bowel sounds are normal.      Palpations: Abdomen is soft.     Tenderness: There is no abdominal tenderness.  Musculoskeletal:        General: No swelling or tenderness.     Cervical back: Neck supple. No tenderness.  Lymphadenopathy:     Cervical: No cervical adenopathy.  Skin:    Findings: No erythema  or rash.  Neurological:     Mental Status: She is alert.  Psychiatric:        Mood and Affect: Mood normal.        Behavior: Behavior normal.      Outpatient Encounter Medications as of 09/27/2021  Medication Sig   labetalol (NORMODYNE) 100 MG tablet Take 1 tablet (100 mg total) by mouth 2 (two) times daily.   metFORMIN (GLUCOPHAGE-XR) 500 MG 24 hr tablet Take 1 tablet (500 mg total) by mouth every evening.   Prenatal Vit-Fe Fumarate-FA (MULTIVITAMIN-PRENATAL) 27-0.8 MG TABS tablet Take 1 tablet by mouth daily at 12 noon.   No facility-administered encounter medications on file as of 09/27/2021.     Lab Results  Component Value Date   WBC 10.0 07/13/2021   HGB 13.1 07/13/2021   HCT 39.5 07/13/2021   PLT 292 07/13/2021   GLUCOSE 101 (H) 06/10/2021   CHOL 195 11/22/2019   TRIG 153.0 (H) 11/22/2019   HDL 36.40 (L) 11/22/2019   LDLCALC 128 (H) 11/22/2019   ALT 22 06/10/2021   AST 21 06/10/2021   NA 139 06/10/2021   K 3.8 06/10/2021   CL 109 06/10/2021   CREATININE 0.49 06/10/2021   BUN 6 06/10/2021   CO2 27 06/10/2021   TSH 0.443 (L) 05/14/2021   INR 1.1 07/13/2021   GLUF 92 11/23/2017   HGBA1C 5.5 05/14/2021    US OB Limited  Result Date: 07/13/2021 CLINICAL DATA:  Vaginal bleeding EXAM: LIMITED OBSTETRIC ULTRASOUND COMPARISON:  06/10/2021 FINDINGS: Number of Fetuses: 1 Heart Rate: Not detected Movement: No Presentation: Cephalic Placental Location: Anterior Previa: No Amniotic Fluid (Subjective):  Decreased FL: 2.2 cm 16 w  5 d MATERNAL FINDINGS: Cervix:  Not well visualized Uterus/Adnexae: No abnormality visualized. Ovaries are within normal limits. IMPRESSION: Findings consistent with interval  fetal demise, no fetal cardiac activity or fetal movement is visualized. These results will be called to the ordering clinician or representative by the Radiologist Assistant, and communication documented in the PACS or [employer]. Electronically Signed   By: Donavan Foil M.D.   On: 07/13/2021 19:59       Assessment & Plan:   Problem List Items Addressed This Visit     Essential hypertension    Blood pressure better on labetolol.  Continue.  Follow pressures.  Follow metabolic panel.       History of migraine headaches    Diagnosed at age 7-14.  Candles - triggered.  Removed candles - headaches improved.  TMJ - triggered.  Seeing dentist - bite guard.  Headaches as outlined.  Appears to be doing better.  Blood pressure better.  Follow.  Discussed magnesium glycinate.        Knee pain, bilateral    Increased knee pain as outlined.  Bilateral.  Persistent.  Limits activity.  Check xray.  Taking tylenol.  Follow.       Relevant Orders   DG Knee 1-2 Views Left (Completed)   DG Knee 1-2 Views Right (Completed)     Einar Pheasant, MD

## 2021-09-29 ENCOUNTER — Other Ambulatory Visit: Payer: Self-pay

## 2021-09-29 DIAGNOSIS — M25562 Pain in left knee: Secondary | ICD-10-CM

## 2021-10-03 ENCOUNTER — Encounter: Payer: Self-pay | Admitting: Internal Medicine

## 2021-10-03 NOTE — Assessment & Plan Note (Addendum)
Diagnosed at age 28-14.  Candles - triggered.  Removed candles - headaches improved.  TMJ - triggered.  Seeing dentist - bite guard.  Headaches as outlined.  Appears to be doing better.  Blood pressure better.  Follow.  Discussed magnesium glycinate.

## 2021-10-03 NOTE — Assessment & Plan Note (Signed)
Blood pressure better on labetolol.  Continue.  Follow pressures.  Follow metabolic panel.

## 2021-10-03 NOTE — Assessment & Plan Note (Signed)
Increased knee pain as outlined.  Bilateral.  Persistent.  Limits activity.  Check xray.  Taking tylenol.  Follow.

## 2021-10-06 ENCOUNTER — Other Ambulatory Visit: Payer: Self-pay | Admitting: Sports Medicine

## 2021-10-06 DIAGNOSIS — M8560 Other cyst of bone, unspecified site: Secondary | ICD-10-CM

## 2021-10-06 DIAGNOSIS — M25862 Other specified joint disorders, left knee: Secondary | ICD-10-CM

## 2021-10-06 DIAGNOSIS — G8929 Other chronic pain: Secondary | ICD-10-CM

## 2021-10-08 ENCOUNTER — Encounter: Payer: Self-pay | Admitting: Certified Nurse Midwife

## 2021-10-13 ENCOUNTER — Encounter: Payer: Self-pay | Admitting: Certified Nurse Midwife

## 2021-10-15 ENCOUNTER — Encounter: Payer: Self-pay | Admitting: Obstetrics and Gynecology

## 2021-10-18 ENCOUNTER — Encounter: Payer: Self-pay | Admitting: Certified Nurse Midwife

## 2021-10-28 ENCOUNTER — Ambulatory Visit
Admission: RE | Admit: 2021-10-28 | Discharge: 2021-10-28 | Disposition: A | Discharge status: Home / Self Care | Source: Ambulatory Visit | Attending: Sports Medicine | Admitting: Sports Medicine

## 2021-10-28 DIAGNOSIS — M25862 Other specified joint disorders, left knee: Secondary | ICD-10-CM | POA: Insufficient documentation

## 2021-10-28 DIAGNOSIS — M25562 Pain in left knee: Secondary | ICD-10-CM | POA: Insufficient documentation

## 2021-10-28 DIAGNOSIS — M8560 Other cyst of bone, unspecified site: Secondary | ICD-10-CM | POA: Insufficient documentation

## 2021-10-28 DIAGNOSIS — G8929 Other chronic pain: Secondary | ICD-10-CM | POA: Insufficient documentation

## 2021-11-09 ENCOUNTER — Telehealth: Payer: Self-pay

## 2021-11-09 NOTE — Telephone Encounter (Signed)
Would probably be better to see her and decide what labs are needed based on evaluation.

## 2021-11-09 NOTE — Telephone Encounter (Signed)
S/w pt - had still birth in July States feels like her hormones have been going crazy since then.  Pt states her last 2 periods were extremely heavy, painful, bad break outs (acne) and long lasting ( 9+ days) States her hair has changed. States has always had coarse curly hair. Her curls are gone and now her hair feels waxy. Pt is experiencing nausea at random times.  Cannot loose weight despite exercise every day, strict diet, and trying metformin for weightloss.  Pt states she is also experiencing hot flashes.  Pt is concerned about being in early menopause.  Pt has been scheduled to come in Monday at 8am, but asking if she should have some labs done prior to discuss at visit.

## 2021-11-09 NOTE — Telephone Encounter (Signed)
Pt requesting hormonal labs to be ordered for her to have done, due to pt stating that she has had some body changes happening over a short period of time which she is not happy with. Pt requesting callback....[name] KitchenMarland Kitchen

## 2021-11-09 NOTE — Telephone Encounter (Signed)
Pt advised.

## 2021-11-15 ENCOUNTER — Encounter: Payer: Self-pay | Admitting: Internal Medicine

## 2021-11-15 ENCOUNTER — Ambulatory Visit (INDEPENDENT_AMBULATORY_CARE_PROVIDER_SITE_OTHER): Admitting: Internal Medicine

## 2021-11-15 ENCOUNTER — Encounter: Payer: Self-pay | Admitting: Certified Nurse Midwife

## 2021-11-15 VITALS — BP 124/70 | HR 74 | Temp 97.9°F | Resp 16 | Ht 67.0 in | Wt 212.2 lb

## 2021-11-15 DIAGNOSIS — N926 Irregular menstruation, unspecified: Secondary | ICD-10-CM | POA: Diagnosis not present

## 2021-11-15 DIAGNOSIS — I10 Essential (primary) hypertension: Secondary | ICD-10-CM | POA: Diagnosis not present

## 2021-11-15 LAB — CBC WITH DIFFERENTIAL/PLATELET
Basophils Absolute: 0 10*3/uL (ref 0.0–0.1)
Basophils Relative: 0.6 % (ref 0.0–3.0)
Eosinophils Absolute: 0.3 10*3/uL (ref 0.0–0.7)
Eosinophils Relative: 3.3 % (ref 0.0–5.0)
HCT: 41.9 % (ref 36.0–46.0)
Hemoglobin: 14 g/dL (ref 12.0–15.0)
Lymphocytes Relative: 36.4 % (ref 12.0–46.0)
Lymphs Abs: 2.9 10*3/uL (ref 0.7–4.0)
MCHC: 33.4 g/dL (ref 30.0–36.0)
MCV: 85.5 fl (ref 78.0–100.0)
Monocytes Absolute: 0.6 10*3/uL (ref 0.1–1.0)
Monocytes Relative: 7.3 % (ref 3.0–12.0)
Neutro Abs: 4.1 10*3/uL (ref 1.4–7.7)
Neutrophils Relative %: 52.4 % (ref 43.0–77.0)
Platelets: 259 10*3/uL (ref 150.0–400.0)
RBC: 4.9 Mil/uL (ref 3.87–5.11)
RDW: 13 % (ref 11.5–15.5)
WBC: 7.9 10*3/uL (ref 4.0–10.5)

## 2021-11-15 LAB — HEPATIC FUNCTION PANEL
ALT: 17 U/L (ref 0–35)
AST: 14 U/L (ref 0–37)
Albumin: 4.2 g/dL (ref 3.5–5.2)
Alkaline Phosphatase: 48 U/L (ref 39–117)
Bilirubin, Direct: 0.1 mg/dL (ref 0.0–0.3)
Total Bilirubin: 0.5 mg/dL (ref 0.2–1.2)
Total Protein: 6.7 g/dL (ref 6.0–8.3)

## 2021-11-15 LAB — BASIC METABOLIC PANEL
BUN: 12 mg/dL (ref 6–23)
CO2: 26 mEq/L (ref 19–32)
Calcium: 9.1 mg/dL (ref 8.4–10.5)
Chloride: 105 mEq/L (ref 96–112)
Creatinine, Ser: 0.67 mg/dL (ref 0.40–1.20)
GFR: 119.26 mL/min (ref >60.00)
Glucose, Bld: 95 mg/dL (ref 70–99)
Potassium: 4.1 mEq/L (ref 3.5–5.1)
Sodium: 138 mEq/L (ref 135–145)

## 2021-11-15 LAB — TSH: TSH: 0.87 u[IU]/mL (ref 0.35–5.50)

## 2021-11-15 LAB — HCG, QUANTITATIVE, PREGNANCY: Quantitative HCG: <0.6 m[IU]/mL

## 2021-11-15 NOTE — Progress Notes (Unsigned)
Patient ID: Sydney James, female   DOB: 03/06/1995, 26 y.o.   MRN: [id number]   Subjective:    Patient ID: Sydney James, female    DOB: 1995-07-28, 28 y.o.   MRN: [id number]    Patient here for  Chief Complaint  Patient presents with   Follow-up   .   HPI Work in - discuss "hormones".  S/p stillborn delivery ([facility]) 07/2021.  Reports heavy periods.  Also increased acne - face and shoulders.  Hair change - previous curly - now straight. Some nausea.  Unable to lose weight.  Also hot flashes.  Heavy periods - recent - lasting 9 days. Heavy cramps. Mood swings. Desires to try and have another baby soon.  Breathing stable. No abdominal pain or bowel change reported.     Past Medical History:  Diagnosis Date   Lumbar herniated disc    Migraine    Preeclampsia    PVC's (premature ventricular contractions)    Raynaud's disease    Scoliosis    Back brace for 2 years   Scoliosis    Past Surgical History:  Procedure Laterality Date   CESAREAN SECTION N/A 12/22/2017   Procedure: CESAREAN SECTION;  Surgeon: Harlin Heys, MD;  Location: [facility];  Service: Obstetrics;  Laterality: N/A;  Female Born @ [place] N/A 03/15/2018   Procedure: DILATATION AND EVACUATION;  Surgeon: Brayton Mars, MD;  Location: [facility];  Service: Gynecology;  Laterality: N/A;   DILATION AND EVACUATION N/A 07/18/2019   Procedure: DILATATION AND EVACUATION;  Surgeon: Rubie Maid, MD;  Location: [facility];  Service: Gynecology;  Laterality: N/A;   WISDOM TOOTH EXTRACTION     Family History  Problem Relation Age of Onset   Hypertension Father    Hypertension Mother    Early death Mother    Cancer Maternal Grandmother    Cancer Maternal Grandfather    Diabetes Maternal Grandfather    Cancer Paternal Grandmother    Diabetes Paternal Grandfather    Social History   Socioeconomic History   Marital status: Married    Spouse name:  Cornell Brucker   Number of children: Not on file   Years of education: Not on file   Highest education level: Not on file  Occupational History   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  Tobacco Use   Smoking status: Never   Smokeless tobacco: Never  Vaping Use   Vaping Use: Never used  Substance and Sexual Activity   Alcohol use: No   Drug use: No   Sexual activity: Not Currently  Other Topics Concern   Not on file  Social History Narrative   Not on file   Social Determinants of Health   Financial Resource Strain: Low Risk  (12/19/2017)   Overall Financial Resource Strain (CARDIA)    Difficulty of Paying Living Expenses: Not hard at all  Food Insecurity: No Food Insecurity (12/19/2017)   Hunger Vital Sign    Worried About Running Out of Food in the Last Year: Never true    [place] in the Last Year: Never true  Transportation Needs: No Transportation Needs (12/19/2017)   PRAPARE - Hydrologist (Medical): No    Lack of Transportation (Non-Medical): No  Physical Activity: Inactive (12/19/2017)   Exercise Vital Sign    Days of Exercise per Week: 0 days    Minutes of Exercise per Session: 0  min  Stress: No Stress Concern Present (12/19/2017)   [employer] of Occupational Health - Occupational Stress Questionnaire    Feeling of Stress : Not at all  Social Connections: Moderately Integrated (12/19/2017)   Social Connection and Isolation Panel [NHANES]    Frequency of Communication with Friends and Family: More than three times a week    Frequency of Social Gatherings with Friends and Family: More than three times a week    Attends Religious Services: Never    Database administrator or Organizations: Yes    Attends Engineer, structural: More than 4 times per year    Marital Status: Married     Review of Systems  Constitutional:  Negative for fever and unexpected weight change.  HENT:  Negative for congestion and sinus pressure.    Respiratory:  Negative for cough, chest tightness and shortness of breath.   Cardiovascular:  Negative for chest pain, palpitations and leg swelling.  Gastrointestinal:  Positive for nausea. Negative for abdominal pain and diarrhea.  Genitourinary:  Negative for difficulty urinating and dysuria.  Musculoskeletal:  Negative for joint swelling and myalgias.  Skin:  Negative for color change and rash.  Neurological:  Negative for dizziness and headaches.  Psychiatric/Behavioral:  Negative for agitation and dysphoric mood.        Objective:     BP 124/70 (BP Location: Left Arm, Patient Position: Sitting, Cuff Size: Large)   Pulse 74   Temp 97.9 F (36.6 C) (Temporal)   Resp 16   Ht 5\' 7"  (1.702 m)   Wt 212 lb 3.2 oz (96.3 kg)   SpO2 98%   BMI 33.24 kg/m  Wt Readings from Last 3 Encounters:  11/15/21 212 lb 3.2 oz (96.3 kg)  09/27/21 213 lb 3.2 oz (96.7 kg)  09/08/21 213 lb 9.6 oz (96.9 kg)    Physical Exam Vitals reviewed.  Constitutional:      General: She is not in acute distress.    Appearance: Normal appearance.  HENT:     Head: Normocephalic and atraumatic.     Right Ear: External ear normal.     Left Ear: External ear normal.  Eyes:     General: No scleral icterus.       Right eye: No discharge.        Left eye: No discharge.     Conjunctiva/sclera: Conjunctivae normal.  Neck:     Thyroid: No thyromegaly.  Cardiovascular:     Rate and Rhythm: Normal rate and regular rhythm.  Pulmonary:     Effort: No respiratory distress.     Breath sounds: Normal breath sounds. No wheezing.  Abdominal:     General: Bowel sounds are normal.     Palpations: Abdomen is soft.     Tenderness: There is no abdominal tenderness.  Musculoskeletal:        General: No swelling or tenderness.     Cervical back: Neck supple. No tenderness.  Lymphadenopathy:     Cervical: No cervical adenopathy.  Skin:    Findings: No erythema or rash.  Neurological:     Mental Status: She is  alert.  Psychiatric:        Mood and Affect: Mood normal.        Behavior: Behavior normal.      Outpatient Encounter Medications as of 11/15/2021  Medication Sig   labetalol (NORMODYNE) 100 MG tablet Take 1 tablet (100 mg total) by mouth 2 (two) times daily.   Prenatal Vit-Fe  Fumarate-FA (MULTIVITAMIN-PRENATAL) 27-0.8 MG TABS tablet Take 1 tablet by mouth daily at 12 noon.   [DISCONTINUED] metFORMIN (GLUCOPHAGE-XR) 500 MG 24 hr tablet Take 1 tablet (500 mg total) by mouth every evening. (Patient not taking: Reported on 11/15/2021)   No facility-administered encounter medications on file as of 11/15/2021.     Lab Results  Component Value Date   WBC 7.9 11/15/2021   HGB 14.0 11/15/2021   HCT 41.9 11/15/2021   PLT 259.0 11/15/2021   GLUCOSE 95 11/15/2021   CHOL 195 11/22/2019   TRIG 153.0 (H) 11/22/2019   HDL 36.40 (L) 11/22/2019   LDLCALC 128 (H) 11/22/2019   ALT 17 11/15/2021   AST 14 11/15/2021   NA 138 11/15/2021   K 4.1 11/15/2021   CL 105 11/15/2021   CREATININE 0.67 11/15/2021   BUN 12 11/15/2021   CO2 26 11/15/2021   TSH 0.87 11/15/2021   INR 1.1 07/13/2021   GLUF 92 11/23/2017   HGBA1C 5.5 05/14/2021    MR KNEE LEFT WO CONTRAST  Result Date: 10/30/2021 CLINICAL DATA:  Left knee pain EXAM: MRI OF THE LEFT KNEE WITHOUT CONTRAST TECHNIQUE: Multiplanar, multisequence MR imaging of the knee was performed. No intravenous contrast was administered. COMPARISON:  Left knee radiograph 09/27/2021 FINDINGS: MENISCI Medial: Intrasubstance degeneration in the posterior horn and body without definitive meniscus tear. Lateral: Intact lateral meniscus. LIGAMENTS Cruciates: ACL and PCL are intact. Collaterals: Medial collateral ligament is intact. Lateral collateral ligament complex is intact. 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 Medial:  No chondral defect. Lateral:  No chondral defect. JOINT: No significant joint effusion. POPLITEAL FOSSA: Tiny Baker's cyst. EXTENSOR MECHANISM: Intact quadriceps tendon. Intact patellar tendon. BONES: There is mild marrow edema within the posterior medial tibial plateau. No acute fracture. No aggressive osseous lesion. Other: No focal fluid collection. 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 Underlying subchondral cystic change and marrow edema in the patella. Intrasubstance degeneration in the posterior horn and body of the medial meniscus without definitive meniscus tear. Mild marrow edema in the posterior medial tibial plateau. Tiny Baker's cyst. Electronically Signed   By: Maurine Simmering M.D.   On: 10/30/2021 10:46       Assessment & Plan:   Problem List Items Addressed This Visit     Essential hypertension    Blood pressure as outlined on labetolol.  Continue.  Follow pressures.  Follow metabolic panel.       Menstrual changes - Primary    Heavy periods.  Increased bleeding.  Also change in hair, skin and mood swings.  Will check routine labs including thyroid and metabolic panel.  Discussed possible need for gyn follow up.       Relevant Orders   CBC with Differential/Platelet (Completed)   Hepatic function panel (Completed)   TSH (Completed)   Basic metabolic panel (Completed)   B-HCG Quant (Completed)     Einar Pheasant, MD

## 2021-11-18 ENCOUNTER — Encounter: Payer: Self-pay | Admitting: Internal Medicine

## 2021-11-18 NOTE — Assessment & Plan Note (Signed)
Blood pressure as outlined on labetolol.  Continue.  Follow pressures.  Follow metabolic panel.  

## 2021-11-18 NOTE — Assessment & Plan Note (Signed)
Heavy periods.  Increased bleeding.  Also change in hair, skin and mood swings.  Will check routine labs including thyroid and metabolic panel.  Discussed possible need for gyn follow up.

## 2021-11-19 ENCOUNTER — Ambulatory Visit: Admitting: Certified Nurse Midwife

## 2021-11-19 ENCOUNTER — Encounter: Payer: Self-pay | Admitting: Certified Nurse Midwife

## 2021-11-19 ENCOUNTER — Ambulatory Visit (INDEPENDENT_AMBULATORY_CARE_PROVIDER_SITE_OTHER): Admitting: Certified Nurse Midwife

## 2021-11-19 VITALS — BP 153/99 | HR 88 | Ht 67.0 in | Wt 213.0 lb

## 2021-11-19 DIAGNOSIS — N926 Irregular menstruation, unspecified: Secondary | ICD-10-CM | POA: Diagnosis not present

## 2021-11-19 DIAGNOSIS — L708 Other acne: Secondary | ICD-10-CM | POA: Diagnosis not present

## 2021-11-19 DIAGNOSIS — N939 Abnormal uterine and vaginal bleeding, unspecified: Secondary | ICD-10-CM

## 2021-11-19 DIAGNOSIS — F528 Other sexual dysfunction not due to a substance or known physiological condition: Secondary | ICD-10-CM | POA: Diagnosis not present

## 2021-11-19 NOTE — Progress Notes (Signed)
GYN ENCOUNTER NOTE  Subjective:       Sydney James is a 26 y.o. [phone] female is here for gynecologic evaluation of the following issues:  1. Hormonal changes, pt states she has had increased sex drive, increase acne, her periods have gotten longer and heavier and her hair texture has changed.  Can not lose weight - she is tracking her calories and exercising regularly.    Gynecologic History No LMP recorded (lmp unknown). Contraception: none Last Pap: 04/08/2020. Results were: normal Last mammogram: n/a .   Obstetric History OB History  Gravida Para Term Preterm AB Living  8 3 1 2 5 3   SAB IAB Ectopic Multiple Live Births  3 0 0 0 3    # Outcome Date GA Lbr Len/2nd Weight Sex Delivery Anes PTL Lv  8 AB 07/14/21 [redacted]w[redacted]d  2.5 oz (0.07 kg)  Vag-Spont None  FD  7 Term 10/04/20 [redacted]w[redacted]d / 00:18 6 lb 14.1 oz (3.12 kg) M Vag-Spont EPI  LIV  6 AB 07/18/19 [redacted]w[redacted]d    SAB     5 Preterm 12/22/17 [redacted]w[redacted]d  4 lb 9.4 oz (2.08 kg) M CS-LTranv EPI, Spinal  LIV  4 SAB 09/2016          3 SAB 2018          2 Preterm 01/25/15 [redacted]w[redacted]d 03:30 / 01:45 6 lb 4.2 oz (2.84 kg) M Vag-Spont EPI  LIV  1 SAB 2011            Obstetric Comments  G6- missed AB at [redacted] weeks gestation    Past Medical History:  Diagnosis Date   Lumbar herniated disc    Migraine    Preeclampsia    PVC's (premature ventricular contractions)    Raynaud's disease    Scoliosis    Back brace for 2 years   Scoliosis     Past Surgical History:  Procedure Laterality Date   CESAREAN SECTION N/A 12/22/2017   Procedure: CESAREAN SECTION;  Surgeon: 12/24/2017, MD;  Location: [facility];  Service: Obstetrics;  Laterality: N/A;  Female Born @ 60     DILATION AND CURETTAGE OF UTERUS     DILATION AND EVACUATION N/A 03/15/2016   Procedure: DILATATION AND EVACUATION;  Surgeon: 03/17/2016, MD;  Location: [facility];  Service: Gynecology;  Laterality: N/A;   DILATION AND EVACUATION N/A 07/18/2019   Procedure: DILATATION AND  EVACUATION;  Surgeon: 07/20/2019, MD;  Location: [facility];  Service: Gynecology;  Laterality: N/A;   WISDOM TOOTH EXTRACTION      Current Outpatient Medications on File Prior to Visit  Medication Sig Dispense Refill   labetalol (NORMODYNE) 100 MG tablet Take 1 tablet (100 mg total) by mouth 2 (two) times daily. 120 tablet 2   Prenatal Vit-Fe Fumarate-FA (MULTIVITAMIN-PRENATAL) 27-0.8 MG TABS tablet Take 1 tablet by mouth daily at 12 noon.     No current facility-administered medications on file prior to visit.    Allergies  Allergen Reactions   Aloe Hives   Lubricants     Has to have water based lubricant. 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  Tobacco Use   Smoking status: Never   Smokeless tobacco: Never  Vaping Use   Vaping Use: Never used  Substance and Sexual Activity   Alcohol use: No   Drug use: No   Sexual activity: Not Currently  Other Topics Concern   Not on file  Social History Narrative   Not on file   Social Determinants of Health   Financial Resource Strain: Low Risk  (12/19/2017)   Overall Financial Resource Strain (CARDIA)    Difficulty of Paying Living Expenses: Not hard at all  Food Insecurity: No Food Insecurity (12/19/2017)   Hunger Vital Sign    Worried About Running Out of Food in the Last Year: Never true    Ran Out of Food in the Last Year: Never true  Transportation Needs: No Transportation Needs (12/19/2017)   PRAPARE - Administrator, Civil Service (Medical): No    Lack of Transportation (Non-Medical): No  Physical Activity: Inactive (12/19/2017)   Exercise Vital Sign    Days of  Exercise per Week: 0 days    Minutes of Exercise per Session: 0 min  Stress: No Stress Concern Present (12/19/2017)   [employer] of Occupational Health - Occupational Stress Questionnaire    Feeling of Stress : Not at all  Social Connections: Moderately Integrated (12/19/2017)   Social Connection and Isolation Panel [NHANES]    Frequency of Communication with Friends and Family: More than three times a week    Frequency of Social Gatherings with Friends and Family: More than three times a week    Attends Religious Services: Never    Database administrator or Organizations: Yes    Attends Engineer, structural: More than 4 times per year    Marital Status: Married  Catering manager Violence: Not At Risk (12/19/2017)   Humiliation, Afraid, Rape, and Kick questionnaire    Fear of Current or Ex-Partner: No    Emotionally Abused: No    Physically Abused: No    Sexually Abused: No    Family History  Problem Relation Age of Onset   Hypertension Father    Hypertension Mother    Early death Mother    Cancer Maternal Grandmother    Cancer Maternal Grandfather    Diabetes Maternal Grandfather    Cancer Paternal Grandmother    Diabetes Paternal Grandfather     The following portions of the patient's history were reviewed and updated as appropriate: allergies, current medications, past family history, past medical history, past social history, past surgical history and problem list.  Review of Systems Review of Systems - Negative except as mentioned in HPI Review of Systems - General ROS: negative for - chills, fatigue, fever, hot flashes, malaise or night sweats Hematological and Lymphatic ROS: negative for - bleeding problems or swollen lymph nodes Gastrointestinal ROS: negative for - abdominal pain, blood in stools, change in bowel habits and nausea/vomiting Musculoskeletal ROS: negative for - joint pain, muscle pain or muscular weakness Genito-Urinary ROS: negative for  - dysmenorrhea, dyspareunia, dysuria, genital discharge, genital ulcers, hematuria, incontinence, irregular/heavy menses, nocturia or pelvic pain. Positive for change in cycle and increase in sex drive.   Skin: positive for increased acne, change in hair texture.   Objective:   BP (!) 153/99   Pulse 88   Ht 5\' 7"  (1.702 m)   Wt 213 lb (96.6 kg)   LMP  (LMP Unknown)   Breastfeeding No   BMI  33.36 kg/m  CONSTITUTIONAL: Well-developed, well-nourished female in no acute distress.  HENT:  Normocephalic, atraumatic.  NECK: Normal range of motion, supple, no masses.  Normal thyroid.  SKIN: Skin is warm and dry. No rash noted. Not diaphoretic. No erythema. No pallor. NEUROLGIC: Alert and oriented to person, place, and time. PSYCHIATRIC: Normal mood and affect. Normal behavior. Normal judgment and thought content. CARDIOVASCULAR:Not Examined RESPIRATORY: Not Examined BREASTS: Not Examined ABDOMEN: Soft, non distended; Non tender.  No Organomegaly. PELVIC:not indicated MUSCULOSKELETAL: Normal range of motion. No tenderness.  No cyanosis, clubbing, or edema.     Assessment:   Acne Increased sex drive Abnormal uterine bleeding.    Plan:   Discussed possible causes of abnormal uterine bleeding. U/s ordered to evaluate for fibroids. Discussed stress as possible cause to acne and difficulty losing weight. Labs ordered. Will follow up   Doreene Burke, CNM

## 2021-11-24 ENCOUNTER — Encounter: Payer: Self-pay | Admitting: Certified Nurse Midwife

## 2021-11-24 LAB — TESTOSTERONE, FREE, TOTAL, SHBG
Sex Hormone Binding: 29.7 nmol/L (ref 24.6–122.0)
Testosterone, Free: 0.7 pg/mL (ref 0.0–4.2)
Testosterone: 17 ng/dL (ref 13–71)

## 2021-11-24 LAB — PROGESTERONE: Progesterone: 5.8 ng/mL

## 2021-11-24 LAB — CORTISOL: Cortisol: 6.3 ug/dL (ref 6.2–19.4)

## 2021-11-24 LAB — ESTRADIOL: Estradiol: 106 pg/mL

## 2021-12-01 ENCOUNTER — Other Ambulatory Visit: Payer: Self-pay | Admitting: Orthopedic Surgery

## 2021-12-07 ENCOUNTER — Encounter: Payer: Self-pay | Admitting: Orthopedic Surgery

## 2021-12-09 ENCOUNTER — Ambulatory Visit (INDEPENDENT_AMBULATORY_CARE_PROVIDER_SITE_OTHER)

## 2021-12-09 DIAGNOSIS — N939 Abnormal uterine and vaginal bleeding, unspecified: Secondary | ICD-10-CM | POA: Diagnosis not present

## 2021-12-13 ENCOUNTER — Ambulatory Visit
Admission: RE | Admit: 2021-12-13 | Discharge: 2021-12-13 | Disposition: A | Discharge status: Home / Self Care | Attending: Orthopedic Surgery | Admitting: Orthopedic Surgery

## 2021-12-13 ENCOUNTER — Other Ambulatory Visit: Payer: Self-pay

## 2021-12-13 ENCOUNTER — Ambulatory Visit: Admitting: Anesthesiology

## 2021-12-13 ENCOUNTER — Encounter
Admission: RE | Disposition: A | Discharge status: Home / Self Care | Payer: Self-pay | Source: Home / Self Care | Attending: Orthopedic Surgery

## 2021-12-13 DIAGNOSIS — M25562 Pain in left knee: Secondary | ICD-10-CM | POA: Insufficient documentation

## 2021-12-13 HISTORY — DX: Gastro-esophageal reflux disease without esophagitis: K21.9

## 2021-12-13 HISTORY — DX: Motion sickness, initial encounter: T75.3XXA

## 2021-12-13 HISTORY — PX: KNEE ARTHROSCOPY: SHX127

## 2021-12-13 LAB — POCT PREGNANCY, URINE: Preg Test, Ur: NEGATIVE

## 2021-12-13 SURGERY — ARTHROSCOPY, KNEE
Anesthesia: General | Site: Knee | Laterality: Left

## 2021-12-13 MED ORDER — DEXAMETHASONE SODIUM PHOSPHATE 10 MG/ML IJ SOLN
INTRAMUSCULAR | Status: DC | PRN
  Administered 2021-12-13: 10 mg via INTRAVENOUS

## 2021-12-13 MED ORDER — ONDANSETRON 4 MG PO TBDP: 4.0000 mg | ORAL_TABLET | Freq: Three times a day (TID) | ORAL | 0 refills | Status: DC | PRN

## 2021-12-13 MED ORDER — ONDANSETRON HCL 4 MG/2ML IJ SOLN
INTRAMUSCULAR | Status: DC | PRN
  Administered 2021-12-13: 4 mg via INTRAVENOUS

## 2021-12-13 MED ORDER — MIDAZOLAM HCL 2 MG/2ML IJ SOLN
INTRAMUSCULAR | Status: DC | PRN
  Administered 2021-12-13 (×2): 1 mg via INTRAVENOUS

## 2021-12-13 MED ORDER — ACETAMINOPHEN 500 MG PO TABS: 1000.0000 mg | ORAL_TABLET | Freq: Three times a day (TID) | ORAL | 2 refills | Status: DC

## 2021-12-13 MED ORDER — IBUPROFEN 800 MG PO TABS: 800.0000 mg | ORAL_TABLET | Freq: Three times a day (TID) | ORAL | 0 refills | Status: AC

## 2021-12-13 MED ORDER — HYDROCODONE-ACETAMINOPHEN 5-325 MG PO TABS: 1.0000 | ORAL_TABLET | ORAL | 0 refills | Status: DC | PRN

## 2021-12-13 MED ORDER — OXYCODONE HCL 5 MG PO TABS: 5.0000 mg | ORAL_TABLET | Freq: Once | ORAL | Status: DC | PRN

## 2021-12-13 MED ORDER — KETOROLAC TROMETHAMINE 30 MG/ML IJ SOLN
INTRAMUSCULAR | Status: DC | PRN
  Administered 2021-12-13: 15 mg via INTRAVENOUS

## 2021-12-13 MED ORDER — ACETAMINOPHEN 10 MG/ML IV SOLN
INTRAVENOUS | Status: DC | PRN
  Administered 2021-12-13: 1000 mg via INTRAVENOUS

## 2021-12-13 MED ORDER — LACTATED RINGERS IR SOLN
Status: DC | PRN
  Administered 2021-12-13: 6000 mL

## 2021-12-13 MED ORDER — PROPOFOL 10 MG/ML IV BOLUS
INTRAVENOUS | Status: DC | PRN
  Administered 2021-12-13: 200 mg via INTRAVENOUS
  Administered 2021-12-13: 70 mg via INTRAVENOUS
  Administered 2021-12-13: 50 mg via INTRAVENOUS

## 2021-12-13 MED ORDER — HYDROMORPHONE HCL 1 MG/ML IJ SOLN: 0.2500 mg | INTRAMUSCULAR | Status: DC | PRN

## 2021-12-13 MED ORDER — FENTANYL CITRATE (PF) 100 MCG/2ML IJ SOLN
INTRAMUSCULAR | Status: DC | PRN
  Administered 2021-12-13 (×2): 50 ug via INTRAVENOUS

## 2021-12-13 MED ORDER — CEFAZOLIN SODIUM-DEXTROSE 2-4 GM/100ML-% IV SOLN
2.0000 g | INTRAVENOUS | Status: AC
  Administered 2021-12-13: 2 g via INTRAVENOUS

## 2021-12-13 MED ORDER — ASPIRIN 325 MG PO TBEC: 325.0000 mg | DELAYED_RELEASE_TABLET | Freq: Every day | ORAL | 0 refills | Status: AC

## 2021-12-13 MED ORDER — LACTATED RINGERS IV SOLN: INTRAVENOUS | Status: DC

## 2021-12-13 MED ORDER — LIDOCAINE HCL (CARDIAC) PF 100 MG/5ML IV SOSY
PREFILLED_SYRINGE | INTRAVENOUS | Status: DC | PRN
  Administered 2021-12-13: 30 mg via INTRAVENOUS

## 2021-12-13 MED ORDER — OXYCODONE HCL 5 MG/5ML PO SOLN: 5.0000 mg | Freq: Once | ORAL | Status: DC | PRN

## 2021-12-13 MED ORDER — LIDOCAINE-EPINEPHRINE 1 %-1:100000 IJ SOLN
INTRAMUSCULAR | Status: DC | PRN
  Administered 2021-12-13: 10 mL via INTRAMUSCULAR
  Administered 2021-12-13: 2 mL via INTRAMUSCULAR

## 2021-12-13 SURGICAL SUPPLY — 35 items
APL PRP STRL LF DISP 70% ISPRP (MISCELLANEOUS) ×1
BLADE SHAVER 4.5X7 STR FR (MISCELLANEOUS) ×1 IMPLANT
BLADE SURG SZ11 CARB STEEL (BLADE) ×1 IMPLANT
BNDG ADH 5X4 AIR PERM ELC (GAUZE/BANDAGES/DRESSINGS) ×1
BNDG COHESIVE 4X5 WHT NS (GAUZE/BANDAGES/DRESSINGS) ×1 IMPLANT
BNDG ESMARK 6X12 TAN STRL LF (GAUZE/BANDAGES/DRESSINGS) ×1 IMPLANT
CHLORAPREP W/TINT 26 (MISCELLANEOUS) ×1 IMPLANT
COOLER POLAR GLACIER W/PUMP (MISCELLANEOUS) ×1 IMPLANT
COVER LIGHT HANDLE UNIVERSAL (MISCELLANEOUS) ×2 IMPLANT
DRAPE EXTREMITY T 121X128X90 (DISPOSABLE) ×1 IMPLANT
DRAPE IMP U-DRAPE 54X76 (DRAPES) ×1 IMPLANT
GAUZE SPONGE 4X4 12PLY STRL (GAUZE/BANDAGES/DRESSINGS) ×1 IMPLANT
GLOVE SURG ENC MOIS LTX SZ7.5 (GLOVE) ×1 IMPLANT
GLOVE SURG UNDER LTX SZ8 (GLOVE) ×1 IMPLANT
GOWN STRL REIN 2XL XLG LVL4 (GOWN DISPOSABLE) ×1 IMPLANT
GOWN STRL REUS W/TWL LRG LVL3 (GOWN DISPOSABLE) ×1 IMPLANT
IV LACTATED RINGER IRRG 3000ML (IV SOLUTION) ×2
IV LR IRRIG 3000ML ARTHROMATIC (IV SOLUTION) ×4 IMPLANT
KIT TURNOVER KIT A (KITS) ×1 IMPLANT
MANIFOLD NEPTUNE II (INSTRUMENTS) ×1 IMPLANT
MAT ABSORB  FLUID 56X50 GRAY (MISCELLANEOUS) ×1
MAT ABSORB FLUID 56X50 GRAY (MISCELLANEOUS) ×1 IMPLANT
NDL 18GX1X1/2 (RX/OR ONLY) (NEEDLE) IMPLANT
NEEDLE 18GX1X1/2 (RX/OR ONLY) (NEEDLE) ×1 IMPLANT
PACK ARTHROSCOPY KNEE (MISCELLANEOUS) ×1 IMPLANT
PAD ABD DERMACEA PRESS 5X9 (GAUZE/BANDAGES/DRESSINGS) ×1 IMPLANT
PAD WRAPON POLAR KNEE (MISCELLANEOUS) ×1 IMPLANT
PADDING CAST BLEND 6X4 STRL (MISCELLANEOUS) ×1 IMPLANT
SUT ETHILON 3-0 (SUTURE) IMPLANT
SYR 5ML LL (SYRINGE) ×1 IMPLANT
TOWEL OR 17X26 4PK STRL BLUE (TOWEL DISPOSABLE) ×2 IMPLANT
TUBING INFLOW SET DBFLO PUMP (TUBING) ×1 IMPLANT
TUBING OUTFLOW SET DBLFO PUMP (TUBING) ×1 IMPLANT
WAND WEREWOLF FLOW 90D (MISCELLANEOUS) ×1 IMPLANT
WRAPON POLAR PAD KNEE (MISCELLANEOUS) ×1

## 2021-12-13 NOTE — Transfer of Care (Signed)
Immediate Anesthesia Transfer of Care Note  Patient: Sydney James  Procedure(s) Performed: Left knee arthroscopy and chondroplasty with cartilage biopsy (Left: Knee)  Patient Location: PACU  Anesthesia Type:General  Level of Consciousness: awake  Airway & Oxygen Therapy: Patient Spontanous Breathing  Post-op Assessment: Report given to RN  Post vital signs: Reviewed  Last Vitals:  Vitals Value Taken Time  BP 126/61 12/13/21 1354  Temp    Pulse 64 12/13/21 1356  Resp 13 12/13/21 1356  SpO2 94 % 12/13/21 1356  Vitals shown include unvalidated device data.  Last Pain:  Vitals:   12/13/21 1125  TempSrc: Temporal  PainSc: 0-No pain      Patients Stated Pain Goal: 4 (12/13/21 1125)  Complications: No notable events documented.

## 2021-12-13 NOTE — Discharge Instructions (Signed)
Arthroscopic Knee Surgery   Post-Op Instructions   1. Bracing or crutches: Crutches will be provided at the time of discharge from the surgery center if you do not already have them.   2. Ice: You may be provided with a device (Polar Care) that allows you to ice the affected area effectively. Otherwise you can ice manually.    3. Driving:  Plan on not driving for at least one week. Please note that you are advised NOT to drive while taking narcotic pain medications as you may be impaired and unsafe to drive.   4. Activity: Ankle pumps several times an hour while awake to prevent blood clots. Weight bearing: as tolerated. Use crutches for as needed (usually ~1 week or less) until pain allows you to ambulate without a limp. Bending and straightening the knee is unlimited. Elevate knee above heart level as much as possible for one week. Avoid standing more than 5 minutes (consecutively) for the first week.  Avoid long distance travel for 2 weeks.   5. Medications:  - You have been provided a prescription for narcotic pain medicine. After surgery, take 1-2 narcotic tablets every 4 hours if needed for severe pain.  - You may take up to 3000mg/day of tylenol (acetaminophen). You can take 1000mg 3x/day. Please check your narcotic. If you have acetaminophen in your narcotic (each tablet will be 325mg), be careful not to exceed a total of 3000mg/day of acetaminophen.  - A prescription for anti-nausea medication will be provided in case the narcotic medicine causes nausea - take 1 tablet every 6 hours only if nauseated.  - Take ibuprofen 800 mg every 8 hours WITH food to reduce post-operative knee swelling. DO NOT STOP IBUPROFEN POST-OP UNTIL INSTRUCTED TO DO SO at first post-op office visit (10-14 days after surgery). However, please discontinue if you have any abdominal discomfort after taking this.  - Take enteric coated aspirin 325 mg once daily for 2 weeks to prevent blood clots.   6. Bandages: The  physical therapist should change the bandages at the first post-op appointment. If needed, the dressing supplies have been provided to you.   7. Physical Therapy: 1-2 times per week for 6 weeks. Therapy typically starts on post operative Day 3 or 4. You have been provided an order for physical therapy. The therapist will provide home exercises.   8. Work: May return to full work usually around 2 weeks after 1st post-operative visit. May do light duty/desk job in approximately 1-2 weeks when off of narcotics, pain is well-controlled, and swelling has decreased. Labor intensive jobs may require 4-6 weeks to return.      9. Post-Op Appointments: Your first post-op appointment will be with Dr. Gussie Towson in approximately 2 weeks time.    If you find that they have not been scheduled please call the Orthopaedic Appointment front desk at [phone].  

## 2021-12-13 NOTE — Anesthesia Postprocedure Evaluation (Signed)
Anesthesia Post Note  Patient: Reiley Bertagnolli  Procedure(s) Performed: Left knee arthroscopy and chondroplasty with cartilage biopsy (Left: Knee)  Patient location during evaluation: PACU Anesthesia Type: General Level of consciousness: awake and alert Pain management: pain level controlled Vital Signs Assessment: post-procedure vital signs reviewed and stable Respiratory status: spontaneous breathing, nonlabored ventilation, respiratory function stable and patient connected to nasal cannula oxygen Cardiovascular status: blood pressure returned to baseline and stable Postop Assessment: no apparent nausea or vomiting Anesthetic complications: no   No notable events documented.   Last Vitals:  Vitals:   12/13/21 1355 12/13/21 1400  BP: 126/61 139/74  Pulse: 79 86  Resp: 18 12  Temp: (!) 36.2 C   SpO2: 97% 95%    Last Pain:  Vitals:   12/13/21 1355  TempSrc:   PainSc: 0-No pain                 Louie Boston

## 2021-12-13 NOTE — Anesthesia Preprocedure Evaluation (Signed)
Anesthesia Evaluation  Patient identified by MRN, date of birth, ID band Patient awake    Reviewed: Allergy & Precautions, NPO status , Patient's Chart, lab work & pertinent test results  Airway Mallampati: II  TM Distance: >3 FB Neck ROM: full    Dental  (+) Teeth Intact   Pulmonary neg pulmonary ROS   Pulmonary exam normal        Cardiovascular hypertension, On Medications negative cardio ROS Normal cardiovascular exam     Neuro/Psych  Headaches  negative psych ROS   GI/Hepatic Neg liver ROS,GERD  Controlled,,  Endo/Other  negative endocrine ROS    Renal/GU      Musculoskeletal   Abdominal   Peds  Hematology negative hematology ROS (+)   Anesthesia Other Findings Past Medical History: No date: GERD (gastroesophageal reflux disease) No date: Lumbar herniated disc No date: Migraine No date: Motion sickness     Comment:  passenger -  car No date: Preeclampsia No date: PVC's (premature ventricular contractions)     Comment:  during pregnancy No date: Raynaud's disease No date: Scoliosis     Comment:  Back brace for 2 years  Past Surgical History: 12/22/2017: CESAREAN SECTION; N/A     Comment:  Procedure: CESAREAN SECTION;  Surgeon: Linzie Collin, MD;  Location: [facility];  Service: Obstetrics;                Laterality: N/A;  Female Born @ [phone]   No date: DILATION AND CURETTAGE OF UTERUS 03/15/2016: DILATION AND EVACUATION; N/A     Comment:  Procedure: DILATATION AND EVACUATION;  Surgeon: Herold Harms, MD;  Location: [facility];  Service:               Gynecology;  Laterality: N/A; 07/18/2019: DILATION AND EVACUATION; N/A     Comment:  Procedure: DILATATION AND EVACUATION;  Surgeon: Hildred Laser, MD;  Location: [facility];  Service: Gynecology;                Laterality: N/A; No date: WISDOM TOOTH EXTRACTION  BMI    Body Mass Index: 32.58 kg/m       Reproductive/Obstetrics negative OB ROS                             Anesthesia Physical Anesthesia Plan  ASA: 2  Anesthesia Plan: General LMA   Post-op Pain Management: Minimal or no pain anticipated, Toradol IV (intra-op) and Tylenol PO (pre-op)   Induction: Intravenous  PONV Risk Score and Plan: 3 and Dexamethasone, Ondansetron, Midazolam and Treatment may vary due to age or medical condition  Airway Management Planned: LMA  Additional Equipment:   Intra-op Plan:   Post-operative Plan: Extubation in OR  Informed Consent: I have reviewed the patients History and Physical, chart, labs and discussed the procedure including the risks, benefits and alternatives for the proposed anesthesia with the patient or authorized representative who has indicated his/her understanding and acceptance.     Dental Advisory Given  Plan Discussed with: Anesthesiologist, CRNA and Surgeon  Anesthesia Plan Comments: (Patient consented for risks of anesthesia including but not limited to:  - adverse reactions to medications - damage to eyes, teeth, lips or other oral  mucosa - nerve damage due to positioning  - sore throat or hoarseness - Damage to heart, brain, nerves, lungs, other parts of body or loss of life  Patient voiced understanding.)        Anesthesia Quick Evaluation

## 2021-12-13 NOTE — Op Note (Signed)
Operative Note    SURGERY DATE: 12/13/2021    PRE-OP DIAGNOSIS: 1. Left patella chondral defect   POST-OP DIAGNOSIS:  1. Left patella chondral defect 2. Left trochlea chondral defect   PROCEDURES:  1. Left knee chondroplasty of the patella and trochlea 2. Left knee matrix autologous chondrocyte implantation (MACI) biopsy   SURGEON: Cato Mulligan, MD   ANESTHESIA: Gen   ESTIMATED BLOOD LOSS: minimal   TOTAL IV FLUIDS: per anesthesia   INDICATION(S):   Sydney James is a 26 y.o. female who has had a 2-year history of anterior left knee pain.  She has attempted extensive nonoperative measures including activity modifications, medical management, and exercises without improvement in her symptoms. MRI shows significant full-thickness cartilage defect of the patella underlying subchondral marrow edema. Given this finding, after discussion of risks, benefits, and alternatives to surgery, the patient elected to proceed with two stage surgery. The first stage consists of diagnostic knee arthroscopy, chondroplasty of cartilage defect, and MACI biopsy. This will be performed today. The second stage will consist of MACI implantation to patella with tibial tubercle osteotomy.   OPERATIVE FINDINGS:    Examination under anesthesia: A careful examination under anesthesia was performed.  Passive range of motion was: Hyperextension: 2.  Extension: 0.  Flexion: 130.  Lachman: normal. Pivot Shift: normal.  Posterior drawer: normal.  Varus stability in full extension: normal.  Varus stability in 30 degrees of flexion: normal.  Valgus stability in full extension: normal.  Valgus stability in 30 degrees of flexion: normal. Patellar mobility: 3 quadrants laterally, 2 quadrants medially. Patella in resting subluxed position over lateral femoral condyle. Positive "J" sign with knee flexion/extension.   Intra-operative findings: A thorough arthroscopic examination of the knee was performed.  The findings  are: 1. Suprapatellar pouch: Normal 2. Undersurface of median ridge:  Extension of medial patellar facet defect; otherwise grade 1 softening 3. Medial patellar facet: Grade 4 chondral defect measuring 1.5 x 1.3 cm after debridement 4. Lateral patellar facet: Grade 1 softening 5. Trochlea: Focal area of grade 4 chondral defect of the medial trochlea measuring 1.0 cm (superior/inferior) x 0.4 cm (medial/lateral) after debridement. 6. Lateral gutter/popliteus tendon: Normal 7. Hoffa's fat pad: Inflamed 8. Medial gutter/plica: Normal 9. ACL: Normal 10. PCL: Normal 11. Medial meniscus: Normal 12. Medial compartment cartilage: Focal area of grade 2 degenerative change along the medial aspect adjacent to the intercondylar notch 13. Lateral meniscus: Normal 14. Lateral compartment cartilage:  Normal   OPERATIVE REPORT:     I identified Dietitian in the pre-operative holding area. I marked the operative knee with my initials. I reviewed the risks and benefits of the proposed surgical intervention and the patient wished to proceed. The patient was transferred to the operative suite and placed in the supine position with all bony prominences padded.  Anesthesia was administered. Appropriate IV antibiotics were administered prior to incision. The extremity was then prepped and draped in standard fashion.  A tourniquet was placed.  A time out was performed confirming the correct extremity, correct patient, and correct procedure.   Arthroscopy portals were marked. Local anesthetic was injected to the planned portal sites. The anterolateral portal was established with an 11 blade.      The arthroscope was placed in the anterolateral portal and then into the suprapatellar pouch. Next, the medial portal was established under needle localization. A diagnostic knee scope was completed with the above findings. The chondral defects were identified.  An oscillating shaver was used to debride  the chondral  defects such that there were stable edges.  Lesion sizes as indicated above.   Using combination of narrow osteotomes and a grasper, cartilage biopsy was performed from the lateral wall of the intercondylar notch as this is a nonweightbearing region.  These were sent with the appropriate MACI biopsy kit and appropriate documentation.   The portals were closed with 3-0 Nylon suture. Sterile dressings included Xeroform, 4x4s, Sof-Rol, and Bias wrap. A Polarcare was placed.  The patient was then awakened and taken to the PACU hemodynamically stable without complication.     POSTOPERATIVE PLAN: The patient will be discharged home today once they meet PACU criteria. Aspirin 325 mg daily was prescribed for 2 weeks for DVT prophylaxis.   Weight-bearing as tolerated. Follow up in 2 weeks per protocol. Physical therapy will start on POD#3-4.  Plan will be for MACI implantation and appropriate concurrent procedures in approximately 7-8 weeks.

## 2021-12-13 NOTE — H&P (Signed)
Paper H&P to be scanned into permanent record. H&P reviewed. No significant changes noted.  

## 2021-12-14 ENCOUNTER — Encounter: Payer: Self-pay | Admitting: Orthopedic Surgery

## 2021-12-14 ENCOUNTER — Encounter: Payer: Self-pay | Admitting: Internal Medicine

## 2021-12-14 DIAGNOSIS — M238X2 Other internal derangements of left knee: Secondary | ICD-10-CM | POA: Insufficient documentation

## 2021-12-16 ENCOUNTER — Encounter: Payer: Self-pay | Admitting: Certified Nurse Midwife

## 2022-01-11 ENCOUNTER — Ambulatory Visit (INDEPENDENT_AMBULATORY_CARE_PROVIDER_SITE_OTHER): Admitting: Internal Medicine

## 2022-01-11 ENCOUNTER — Encounter: Payer: Self-pay | Admitting: Internal Medicine

## 2022-01-11 DIAGNOSIS — Z8669 Personal history of other diseases of the nervous system and sense organs: Secondary | ICD-10-CM
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
## 2022-01-11 NOTE — Progress Notes (Unsigned)
Patient ID: Sydney James, female   DOB: January 16, 1995, 27 y.o.   MRN: [id number] Did not show for appt.

## 2022-02-09 ENCOUNTER — Encounter: Payer: Self-pay | Admitting: Internal Medicine

## 2022-02-09 ENCOUNTER — Ambulatory Visit (INDEPENDENT_AMBULATORY_CARE_PROVIDER_SITE_OTHER): Admitting: Internal Medicine

## 2022-02-09 VITALS — BP 128/80 | HR 80 | Temp 97.9°F | Resp 16 | Ht 67.0 in | Wt 213.2 lb

## 2022-02-09 DIAGNOSIS — G479 Sleep disorder, unspecified: Secondary | ICD-10-CM

## 2022-02-09 DIAGNOSIS — M238X2 Other internal derangements of left knee: Secondary | ICD-10-CM | POA: Diagnosis not present

## 2022-02-09 DIAGNOSIS — I10 Essential (primary) hypertension: Secondary | ICD-10-CM | POA: Diagnosis not present

## 2022-02-09 DIAGNOSIS — F419 Anxiety disorder, unspecified: Secondary | ICD-10-CM | POA: Diagnosis not present

## 2022-02-09 MED ORDER — SERTRALINE HCL 50 MG PO TABS: ORAL_TABLET | ORAL | 2 refills | Status: DC
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
## 2022-02-13 ENCOUNTER — Encounter: Payer: Self-pay | Admitting: Internal Medicine

## 2022-02-13 DIAGNOSIS — F419 Anxiety disorder, unspecified: Secondary | ICD-10-CM | POA: Insufficient documentation

## 2022-02-13 NOTE — Assessment & Plan Note (Signed)
Discussed increased stress and anxiety.  Discussed treatment options.  Does not feel needs counseling at this time.  Discussed medication.  Start zoloft as directed.  Follow.  Schedule soon f/u to reassess.  Call with update.

## 2022-02-13 NOTE — Assessment & Plan Note (Signed)
Blood pressure as outlined on labetolol.  Continue.  Follow pressures.  Follow metabolic panel.

## 2022-02-13 NOTE — Assessment & Plan Note (Signed)
Treat anxiety as outlined.  Feel will help with sleep.  Follow.

## 2022-02-13 NOTE — Assessment & Plan Note (Signed)
Dr Posey Pronto- 12/13/21 - Left knee chondroplasty of the patella and trochlea. Left knee matrix autologous chondrocyte implantation (MACI) biopsy

## 2022-02-15 ENCOUNTER — Ambulatory Visit: Admitting: Internal Medicine

## 2022-03-31 ENCOUNTER — Telehealth (INDEPENDENT_AMBULATORY_CARE_PROVIDER_SITE_OTHER): Admitting: Internal Medicine

## 2022-03-31 ENCOUNTER — Encounter: Payer: Self-pay | Admitting: Internal Medicine

## 2022-03-31 VITALS — BP 130/80 | Ht 67.0 in | Wt 212.8 lb

## 2022-03-31 DIAGNOSIS — F419 Anxiety disorder, unspecified: Secondary | ICD-10-CM | POA: Diagnosis not present

## 2022-03-31 DIAGNOSIS — I10 Essential (primary) hypertension: Secondary | ICD-10-CM

## 2022-03-31 DIAGNOSIS — R509 Fever, unspecified: Secondary | ICD-10-CM

## 2022-03-31 MED ORDER — AMOXICILLIN-POT CLAVULANATE 875-125 MG PO TABS: 1.0000 | ORAL_TABLET | Freq: Two times a day (BID) | ORAL | 0 refills | Status: DC

## 2022-03-31 MED ORDER — SERTRALINE HCL 50 MG PO TABS: 50.0000 mg | ORAL_TABLET | Freq: Every day | ORAL | 2 refills | Status: DC
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
## 2022-04-02 ENCOUNTER — Encounter: Payer: Self-pay | Admitting: Internal Medicine

## 2022-04-02 DIAGNOSIS — R509 Fever, unspecified: Secondary | ICD-10-CM | POA: Insufficient documentation

## 2022-04-02 NOTE — Assessment & Plan Note (Signed)
Continue labetolol.   Follow pressures.  Follow metabolic panel.

## 2022-04-02 NOTE — Assessment & Plan Note (Signed)
Discussed.  Started on zoloft last visit.  She feels zoloft is helping.  Continue current dose.  Follow

## 2022-04-02 NOTE — Assessment & Plan Note (Signed)
Discussed possible etiologies, including covid, influenza, RSV, etc.  Limited - given virtual visit.  Increased erythema - posterior oropharynx.  Discussed possible strep.  Continue mucinex and tylenol.  Discussed testing and quarantine.  Given symptoms and reported exam findings, will cover with augmentin 875mg  bid.  Rest.  Fluids.  Keep temperature down.  Follow.  Call with update.
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
## 2022-04-04 ENCOUNTER — Encounter: Payer: Self-pay | Admitting: Certified Nurse Midwife

## 2022-04-04 ENCOUNTER — Telehealth: Payer: Self-pay | Admitting: Licensed Practical Nurse

## 2022-04-04 ENCOUNTER — Other Ambulatory Visit: Payer: Self-pay

## 2022-04-04 DIAGNOSIS — B3731 Acute candidiasis of vulva and vagina: Secondary | ICD-10-CM

## 2022-04-04 MED ORDER — FLUCONAZOLE 150 MG PO TABS: 150.0000 mg | ORAL_TABLET | ORAL | 0 refills | Status: AC

## 2022-04-04 NOTE — Telephone Encounter (Signed)
Started to have yeast infection sxs started around Saturday, vaginal itching/disciform and now thick white discharge and irritation  clitoral hood.  Started on Antibiotics on Friday  Gets severe burning with topical Monistat has tolerated Diflucan in the past. Has tried other OTC or natural remedies in the past but nothing ever helped or made or symptoms worse. She has never had a reaction to Diflucan.  Script for Diflucan sent  Roberto Scales, [place], [facility]  04/04/2022 5:04 PM

## 2022-04-07 ENCOUNTER — Telehealth: Payer: Self-pay | Admitting: Internal Medicine

## 2022-04-07 NOTE — Telephone Encounter (Signed)
Patient is aware of below. She did eat this am. She is going to get a glucometer and monitor sugars and send in readings over the next couple of weeks. She is feeling better now and is aware of what to do/eat to get her sugar to come back up.

## 2022-04-07 NOTE — Telephone Encounter (Signed)
Pt called in staying that for the longest her sugar been at the pre-diabetic range, however, as per pt today when she went to work out around 9:15am, she felt weak and started shaking and almost past out. Pt check her sugar around 10:30am and it was very low @ 58. And was wanted to let Dr.Scott know what she should do?

## 2022-04-07 NOTE — Telephone Encounter (Signed)
Reviewed.  Agree with glucometer.  Also, see if she ate this am.  Needs to make sure eating regular meals and protein snack in between. Make sure not to go long periods without eating.  Also need to confirm no concern regarding pregnancy.  Keep [place] posted on how doing and how sugars are doing.  Confirm feeling back to normal now

## 2022-04-07 NOTE — Telephone Encounter (Signed)
Patient says she feels better. She was not able to recheck her blood sugar. She has not changed her diet or exercise pattern. No life style changes. This is the first time this has happened. Pt wasn't sure what she should do so she just wanted you to be aware. She is scheduled to see you in July. Do you want to send in glucometer and have her f/u with you sooner?

## 2022-04-11 ENCOUNTER — Ambulatory Visit (INDEPENDENT_AMBULATORY_CARE_PROVIDER_SITE_OTHER)

## 2022-04-11 ENCOUNTER — Other Ambulatory Visit (HOSPITAL_COMMUNITY)
Admission: RE | Admit: 2022-04-11 | Discharge: 2022-04-11 | Disposition: A | Discharge status: Home / Self Care | Source: Ambulatory Visit | Attending: Certified Nurse Midwife | Admitting: Certified Nurse Midwife

## 2022-04-11 VITALS — BP 120/80 | Ht 67.0 in | Wt 214.0 lb

## 2022-04-11 DIAGNOSIS — N898 Other specified noninflammatory disorders of vagina: Secondary | ICD-10-CM

## 2022-04-11 NOTE — Patient Instructions (Signed)

## 2022-04-11 NOTE — Progress Notes (Signed)
    NURSE VISIT NOTE  Subjective:    Patient ID: Sydney James, female    DOB: 23-Nov-1995, 27 y.o.   MRN: [id number]  HPI  Patient is a 27 y.o. [id number] female who presents for vaginal itching  for 4 day(s). Denies abnormal vaginal bleeding or significant pelvic pain or fever. denies dysuria, hematuria, urinary frequency, urinary urgency, flank pain, abdominal pain, pelvic pain, cloudy malordorous urine, genital rash, genital irritation, and vaginal discharge. Patient denies history of known exposure to STD.   Objective:    BP 120/80   Ht 5\' 7"  (1.702 m)   Wt 214 lb (97.1 kg)   LMP 03/27/2022   BMI 33.52 kg/m    @THIS  VISIT ONLY@  Assessment:   1. Vaginal itching       Plan:   GC and chlamydia BV and Yeast DNA  probe sent to lab. Treatment: abstain from coitus during course of treatment ROV prn if symptoms persist or worsen.   Cornelius Moras, CMA

## 2022-04-12 ENCOUNTER — Telehealth (INDEPENDENT_AMBULATORY_CARE_PROVIDER_SITE_OTHER): Admitting: Internal Medicine

## 2022-04-12 VITALS — Ht 67.0 in | Wt 214.0 lb

## 2022-04-12 DIAGNOSIS — F419 Anxiety disorder, unspecified: Secondary | ICD-10-CM | POA: Diagnosis not present

## 2022-04-12 DIAGNOSIS — J039 Acute tonsillitis, unspecified: Secondary | ICD-10-CM | POA: Diagnosis not present

## 2022-04-12 LAB — CERVICOVAGINAL ANCILLARY ONLY
Bacterial Vaginitis (gardnerella): NEGATIVE
Candida Glabrata: NEGATIVE
Candida Vaginitis: NEGATIVE
Chlamydia: NEGATIVE
Comment: NEGATIVE
Comment: NEGATIVE
Comment: NEGATIVE
Comment: NEGATIVE
Comment: NEGATIVE
Comment: NORMAL
Neisseria Gonorrhea: NEGATIVE
Trichomonas: NEGATIVE

## 2022-04-12 MED ORDER — AMOXICILLIN-POT CLAVULANATE 875-125 MG PO TABS: 1.0000 | ORAL_TABLET | Freq: Two times a day (BID) | ORAL | 0 refills | Status: DC
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
## 2022-04-17 ENCOUNTER — Encounter: Payer: Self-pay | Admitting: Internal Medicine

## 2022-04-17 DIAGNOSIS — J039 Acute tonsillitis, unspecified: Secondary | ICD-10-CM | POA: Insufficient documentation
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
## 2022-04-17 NOTE — Assessment & Plan Note (Signed)
Symptoms as outlined.  Limited exam based on virtual visit.  Did visualized posterior oropharynx.  Exam as outlined.   Discussed possible etiologies, including strep pharyngitis, tonsilitis, etc.  Will treat with augmentin as directed.  Continue tylenol.  Follow symptoms closely.  Call with update.

## 2022-04-17 NOTE — Assessment & Plan Note (Signed)
She feels zoloft is helping.  Continue current dose.  Follow

## 2022-05-03 ENCOUNTER — Encounter: Payer: Self-pay | Admitting: Internal Medicine

## 2022-05-03 NOTE — Telephone Encounter (Signed)
FYI-  Patient has stopped her BP medication. Patient confirmed no acute symptoms right now. While on BP medication patient felt lightheaded and woozy. Symptoms resolved after stopping medication and BP has stayed around 120/70 at rest. Offered patient an appointment to discuss- pt did not feel like was necessary. Advised to keep monitoring BP and send in readings. If any acute symptoms- will be evaluated.

## 2022-05-09 ENCOUNTER — Encounter: Payer: Self-pay | Admitting: Internal Medicine

## 2022-05-09 ENCOUNTER — Ambulatory Visit (INDEPENDENT_AMBULATORY_CARE_PROVIDER_SITE_OTHER): Admitting: Internal Medicine

## 2022-05-09 VITALS — BP 128/72 | HR 80 | Temp 97.9°F | Resp 16 | Ht 67.0 in | Wt 217.0 lb

## 2022-05-09 DIAGNOSIS — F419 Anxiety disorder, unspecified: Secondary | ICD-10-CM | POA: Diagnosis not present

## 2022-05-09 DIAGNOSIS — I10 Essential (primary) hypertension: Secondary | ICD-10-CM

## 2022-05-09 DIAGNOSIS — R635 Abnormal weight gain: Secondary | ICD-10-CM | POA: Diagnosis not present

## 2022-05-09 MED ORDER — ZEPBOUND 2.5 MG/0.5ML ~~LOC~~ SOAJ: 2.5000 mg | SUBCUTANEOUS | 2 refills | Status: DC

## 2022-05-09 NOTE — Progress Notes (Signed)
Subjective:    Patient ID: Sydney James, female    DOB: [birth year], 27 y.o.   MRN: [id number]  Patient here for  Chief Complaint  Patient presents with   Weight Gain    HPI Here as a work in to discuss weight loss.  She has been watching her diet.  Counting calories and monitoring carb intake.  Also exercising - as she can.  Planning for knee surgery in June and is unable to do some of the resistance exercise she was previously able to do.  Is able to spin (bike).  Discussed weight loss medications.  Would like to avoid stimulants - given monitoring blood pressure.  Discussed weight management program.  Discussed prescription medications.  Has tried metformin without success.  Discussed GLP-1 agonist. Was started on amlodipine recently.  Blood pressure was dropping to 110s/60s on the medication, so she stopped.  Blood pressure has been remaining stable - 120s/70s mostly since being off.  On zoloft.    Past Medical History:  Diagnosis Date   GERD (gastroesophageal reflux disease)    Lumbar herniated disc    Migraine    Motion sickness    passenger -  car   Preeclampsia    PVC's (premature ventricular contractions)    during pregnancy   Raynaud's disease    Scoliosis    Back brace for 2 years   Past Surgical History:  Procedure Laterality Date   CESAREAN SECTION N/A 12/22/2017   Procedure: CESAREAN SECTION;  Surgeon: Linzie Collin, MD;  Location: [facility];  Service: Obstetrics;  Laterality: N/A;  Female Born @ 2     DILATION AND CURETTAGE OF UTERUS     DILATION AND EVACUATION N/A 03/15/2016   Procedure: DILATATION AND EVACUATION;  Surgeon: Herold Harms, MD;  Location: [facility];  Service: Gynecology;  Laterality: N/A;   DILATION AND EVACUATION N/A 07/18/2019   Procedure: DILATATION AND EVACUATION;  Surgeon: Hildred Laser, MD;  Location: [facility];  Service: Gynecology;  Laterality: N/A;   KNEE ARTHROSCOPY Left 12/13/2021   Procedure: Left knee arthroscopy and  chondroplasty with cartilage biopsy;  Surgeon: Signa Kell, MD;  Location: [facility];  Service: Orthopedics;  Laterality: Left;   WISDOM TOOTH EXTRACTION     Family History  Problem Relation Age of Onset   Hypertension Father    Hypertension Mother    Early death Mother    Cancer Maternal Grandmother    Cancer Maternal Grandfather    Diabetes Maternal Grandfather    Cancer Paternal Grandmother    Diabetes Paternal Grandfather    Social History   Socioeconomic History   Marital status: Married    Spouse name: Aleycia Augustin   Number of children: Not on file   Years of education: Not on file   Highest education level: Not on file  Occupational History   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  Tobacco Use   Smoking status: Never   Smokeless tobacco: Never  Vaping Use   Vaping Use: Never used  Substance and Sexual Activity   Alcohol use: No   Drug use: No   Sexual activity: Not Currently  Other Topics Concern   Not on file  Social History Narrative   Not on file   Social Determinants of Health   Financial Resource Strain: Low Risk  (12/19/2017)   Overall Financial Resource Strain (CARDIA)    Difficulty of Paying Living Expenses: Not hard at all  Food Insecurity: No Food Insecurity (12/19/2017)   Hunger Vital Sign  Worried About Programme researcher, broadcasting/film/video in the Last Year: Never true    Ran Out of Food in the Last Year: Never true  Transportation Needs: No Transportation Needs (12/19/2017)   PRAPARE - Administrator, Civil Service (Medical): No    Lack of Transportation (Non-Medical): No  Physical Activity: Inactive (12/19/2017)   Exercise Vital Sign    Days of Exercise per Week: 0 days    Minutes of Exercise per Session: 0 min  Stress: No Stress Concern Present (12/19/2017)   [employer] of Occupational Health - Occupational Stress Questionnaire    Feeling of Stress : Not at all  Social Connections: Moderately Integrated (12/19/2017)   Social Connection  and Isolation Panel [NHANES]    Frequency of Communication with Friends and Family: More than three times a week    Frequency of Social Gatherings with Friends and Family: More than three times a week    Attends Religious Services: Never    Database administrator or Organizations: Yes    Attends Engineer, structural: More than 4 times per year    Marital Status: Married     Review of Systems  Constitutional:  Negative for appetite change.       Is concerned regarding not being able to lose weight.  Some weight gain.   HENT:  Negative for congestion and sinus pressure.   Respiratory:  Negative for cough, chest tightness and shortness of breath.   Cardiovascular:  Negative for chest pain and palpitations.  Gastrointestinal:  Negative for abdominal pain, diarrhea, nausea and vomiting.  Genitourinary:  Negative for difficulty urinating and dysuria.  Musculoskeletal:  Negative for joint swelling and myalgias.  Skin:  Negative for color change and rash.  Neurological:  Negative for dizziness and headaches.  Psychiatric/Behavioral:  Negative for agitation and dysphoric mood.        Objective:     BP 128/72   Pulse 80   Temp 97.9 F (36.6 C)   Resp 16   Ht 5\' 7"  (1.702 m)   Wt 217 lb (98.4 kg)   LMP 03/27/2022   SpO2 98%   BMI 33.99 kg/m  Wt Readings from Last 3 Encounters:  05/09/22 217 lb (98.4 kg)  04/12/22 214 lb (97.1 kg)  04/11/22 214 lb (97.1 kg)    Physical Exam Vitals reviewed.  Constitutional:      General: She is not in acute distress.    Appearance: Normal appearance.  HENT:     Head: Normocephalic and atraumatic.     Right Ear: External ear normal.     Left Ear: External ear normal.  Eyes:     General: No scleral icterus.       Right eye: No discharge.        Left eye: No discharge.     Conjunctiva/sclera: Conjunctivae normal.  Neck:     Thyroid: No thyromegaly.  Cardiovascular:     Rate and Rhythm: Normal rate and regular rhythm.   Pulmonary:     Effort: No respiratory distress.     Breath sounds: Normal breath sounds. No wheezing.  Abdominal:     General: Bowel sounds are normal.     Palpations: Abdomen is soft.     Tenderness: There is no abdominal tenderness.  Musculoskeletal:        General: No swelling or tenderness.     Cervical back: Neck supple. No tenderness.  Lymphadenopathy:     Cervical: No cervical adenopathy.  Skin:    Findings: No erythema or rash.  Neurological:     Mental Status: She is alert.  Psychiatric:        Mood and Affect: Mood normal.        Behavior: Behavior normal.      Outpatient Encounter Medications as of 05/09/2022  Medication Sig   tirzepatide (ZEPBOUND) 2.5 MG/0.5ML Pen Inject 2.5 mg into the skin once a week.   sertraline (ZOLOFT) 50 MG tablet Take 1 tablet (50 mg total) by mouth daily.   [DISCONTINUED] amoxicillin-clavulanate (AUGMENTIN) 875-125 MG tablet Take 1 tablet by mouth 2 (two) times daily.   [DISCONTINUED] labetalol (NORMODYNE) 100 MG tablet Take 1 tablet (100 mg total) by mouth 2 (two) times daily.   No facility-administered encounter medications on file as of 05/09/2022.     Lab Results  Component Value Date   WBC 7.9 11/15/2021   HGB 14.0 11/15/2021   HCT 41.9 11/15/2021   PLT 259.0 11/15/2021   GLUCOSE 95 11/15/2021   CHOL 195 11/22/2019   TRIG 153.0 (H) 11/22/2019   HDL 36.40 (L) 11/22/2019   LDLCALC 128 (H) 11/22/2019   ALT 17 11/15/2021   AST 14 11/15/2021   NA 138 11/15/2021   K 4.1 11/15/2021   CL 105 11/15/2021   CREATININE 0.67 11/15/2021   BUN 12 11/15/2021   CO2 26 11/15/2021   TSH 0.87 11/15/2021   INR 1.1 07/13/2021   GLUF 92 11/23/2017   HGBA1C 5.5 05/14/2021    No results found.     Assessment & Plan:  Weight gain Assessment & Plan: Concern regarding weight gain and not being able to lose weight.  Discussed diet and exercise.  Discussed weight management program through [facility].  Discussed medication options.  Avoid  stimulants.  Has tried metformin.  Discussed GLP 1 agonist.  Discussed possible side effects.  She is wanting to try - start zepbound.  Discussed continued diet and exercise.  Follow.     Essential hypertension Assessment & Plan: Was recently started on amlodipine for elevated blood pressure.  Blood pressure was dropping to 110s/60s on the medication, so she stopped.  Blood pressure has been remaining stable - 120s/70s mostly since being off. Follow pressure.  Remain off medication.     Anxiety Assessment & Plan: She feels zoloft is helping.  Continue current dose.  Follow    Other orders -     Zepbound; Inject 2.5 mg into the skin once a week.  Dispense: 2 mL; Refill: 2     Dale Deerfield, MD

## 2022-05-14 ENCOUNTER — Telehealth: Payer: Self-pay

## 2022-05-14 NOTE — Telephone Encounter (Signed)
Prior Auth for Zepbound has been sent to the plan today     [name] (Key: [facility])  PA Case ID #: [id number]  Rx #: [id number]  Status: Sent to Plan today  Drug: Zepbound 2.5MG /0.5ML pen-injectors  Form: [employer] Electronic PA Form (2017 NCPDP) Original Claim Info 75

## 2022-05-16 NOTE — Telephone Encounter (Signed)
Noted  

## 2022-05-18 ENCOUNTER — Other Ambulatory Visit: Payer: Self-pay | Admitting: Orthopedic Surgery

## 2022-05-23 NOTE — Assessment & Plan Note (Signed)
She feels zoloft is helping.  Continue current dose.  Follow  

## 2022-05-23 NOTE — Telephone Encounter (Signed)
Received a denial for Zepbound. "Coverage is provided in situations where the dates and durations of therapy or contraindications have been provided for the following agents tried and failed: phentermine, Qsymia or one of its individual generic components"

## 2022-05-23 NOTE — Assessment & Plan Note (Signed)
Concern regarding weight gain and not being able to lose weight.  Discussed diet and exercise.  Discussed weight management program through [facility].  Discussed medication options.  Avoid stimulants.  Has tried metformin.  Discussed GLP 1 agonist.  Discussed possible side effects.  She is wanting to try - start zepbound.  Discussed continued diet and exercise.  Follow.

## 2022-05-23 NOTE — Assessment & Plan Note (Signed)
Was recently started on amlodipine for elevated blood pressure.  Blood pressure was dropping to 110s/60s on the medication, so she stopped.  Blood pressure has been remaining stable - 120s/70s mostly since being off. Follow pressure.  Remain off medication.

## 2022-05-24 NOTE — Telephone Encounter (Signed)
Please notify pt

## 2022-05-25 NOTE — Telephone Encounter (Signed)
Pt aware.

## 2022-05-31 ENCOUNTER — Encounter
Admission: RE | Admit: 2022-05-31 | Discharge: 2022-05-31 | Disposition: A | Discharge status: Home / Self Care | Source: Ambulatory Visit | Attending: Orthopedic Surgery | Admitting: Orthopedic Surgery

## 2022-05-31 ENCOUNTER — Other Ambulatory Visit: Payer: Self-pay

## 2022-05-31 VITALS — Ht 67.0 in | Wt 215.0 lb

## 2022-05-31 DIAGNOSIS — Z01818 Encounter for other preprocedural examination: Secondary | ICD-10-CM

## 2022-05-31 NOTE — Patient Instructions (Signed)
Your procedure is scheduled on: Tuesday 06/07/22 To find out your arrival time, please call [phone] between 1PM - 3PM on:   Monday 06/06/22 Report to the Registration Desk on the 1st floor of the [facility]. Free Valet parking is available.  If your arrival time is 6:00 am, do not arrive before that time as the [facility] entrance doors do not open until 6:00 am.  REMEMBER: Instructions that are not followed completely may result in serious medical risk, up to and including death; or upon the discretion of your surgeon and anesthesiologist your surgery may need to be rescheduled.  Do not eat food after midnight the night before surgery.  No gum chewing or hard candies.  You may however, drink CLEAR liquids up to 2 hours before you are scheduled to arrive for your surgery. Do not drink anything within 2 hours of your scheduled arrival time.  Clear liquids include: - water  - apple juice without pulp - gatorade (not RED colors) - black coffee or tea (Do NOT add milk or creamers to the coffee or tea) Do NOT drink anything that is not on this list.  Type 1 and Type 2 diabetics should only drink water.  One week prior to surgery: Stop Anti-inflammatories (NSAIDS) such as Advil, Aleve, Ibuprofen, Motrin, Naproxen, Naprosyn and Aspirin based products such as Excedrin, Goody's Powder, BC Powder. You may however, continue to take Tylenol if needed for pain up until the day of surgery.  Stop ANY OVER THE COUNTER supplements until after surgery.  Continue taking all prescription medications.   TAKE ONLY THESE MEDICATIONS THE MORNING OF SURGERY WITH A SIP OF WATER:  sertraline (ZOLOFT)   No Alcohol for 24 hours before or after surgery.  No Smoking including e-cigarettes for 24 hours before surgery.  No chewable tobacco products for at least 6 hours before surgery.  No nicotine patches on the day of surgery.  Do not use any "recreational" drugs for at least a week (preferably 2  weeks) before your surgery.  Please be advised that the combination of cocaine and anesthesia may have negative outcomes, up to and including death. If you test positive for cocaine, your surgery will be cancelled.  On the morning of surgery brush your teeth with toothpaste and water, you may rinse your mouth with mouthwash if you wish. Do not swallow any toothpaste or mouthwash.  Use CHG Soap or wipes as directed on instruction sheet.  Do not wear lotions, powders, or perfumes.   Do not shave body hair from the neck down 48 hours before surgery.  Wear comfortable clothing (specific to your surgery type) to the hospital.  Do not wear jewelry, make-up, hairpins, clips or nail polish.  Contact lenses, hearing aids and dentures may not be worn into surgery.  Do not bring valuables to the hospital. [facility] is not responsible for any missing/lost belongings or valuables.   Notify your doctor if there is any change in your medical condition (cold, fever, infection).  If you are being discharged the day of surgery, you will not be allowed to drive home. You will need a responsible individual to drive you home and stay with you for 24 hours after surgery.   If you are taking public transportation, you will need to have a responsible individual with you.  If you are being admitted to the hospital overnight, leave your suitcase in the car. After surgery it may be brought to your room.  In case of  increased patient census, it may be necessary for you, the patient, to continue your postoperative care in the Same Day Surgery department.  After surgery, you can help prevent lung complications by doing breathing exercises.  Take deep breaths and cough every 1-2 hours. Your doctor may order a device called an Incentive Spirometer to help you take deep breaths. When coughing or sneezing, hold a pillow firmly against your incision with both hands. This is called "splinting." Doing this helps  protect your incision. It also decreases belly discomfort.  Surgery Visitation Policy:  Patients undergoing a surgery or procedure may have two family members or support persons with them as long as the person is not COVID-19 positive or experiencing its symptoms.   Inpatient Visitation:    Visiting hours are 7 a.m. to 8 p.m. Up to four visitors are allowed at one time in a patient room. The visitors may rotate out with other people during the day. One designated support person (adult) may remain overnight.  Please call the Pre-admissions Testing Dept. at [phone] if you have any questions about these instructions.     Preparing for Surgery with CHLORHEXIDINE GLUCONATE (CHG) Soap  Chlorhexidine Gluconate (CHG) Soap  o An antiseptic cleaner that kills germs and bonds with the skin to continue killing germs even after washing  o Used for showering the night before surgery and morning of surgery  Before surgery, you can play an important role by reducing the number of germs on your skin.  CHG (Chlorhexidine gluconate) soap is an antiseptic cleanser which kills germs and bonds with the skin to continue killing germs even after washing.  Please do not use if you have an allergy to CHG or antibacterial soaps. If your skin becomes reddened/irritated stop using the CHG.  1. Shower the NIGHT BEFORE SURGERY and the MORNING OF SURGERY with CHG soap.  2. If you choose to wash your hair, wash your hair first as usual with your normal shampoo.  3. After shampooing, rinse your hair and body thoroughly to remove the shampoo.  4. Use CHG as you would any other liquid soap. You can apply CHG directly to the skin and wash gently with a scrungie or a clean washcloth.  5. Apply the CHG soap to your body only from the neck down. Do not use on open wounds or open sores. Avoid contact with your eyes, ears, mouth, and genitals (private parts). Wash face and genitals (private parts) with your normal  soap.  6. Wash thoroughly, paying special attention to the area where your surgery will be performed.  7. Thoroughly rinse your body with warm water.  8. Do not shower/wash with your normal soap after using and rinsing off the CHG soap.  9. Pat yourself dry with a clean towel.  10. Wear clean pajamas to bed the night before surgery.  12. Place clean sheets on your bed the night of your first shower and do not sleep with pets.  13. Shower again with the CHG soap on the day of surgery prior to arriving at the hospital.  14. Do not apply any deodorants/lotions/powders.  15. Please wear clean clothes to the hospital.

## 2022-06-06 MED ORDER — ORAL CARE MOUTH RINSE: 15.0000 mL | Freq: Once | OROMUCOSAL | Status: AC

## 2022-06-06 MED ORDER — CEFAZOLIN SODIUM-DEXTROSE 2-4 GM/100ML-% IV SOLN
2.0000 g | INTRAVENOUS | Status: AC
  Administered 2022-06-07: 2 g via INTRAVENOUS

## 2022-06-06 MED ORDER — FAMOTIDINE 20 MG PO TABS
20.0000 mg | ORAL_TABLET | Freq: Once | ORAL | Status: AC
  Administered 2022-06-07: 20 mg via ORAL

## 2022-06-06 MED ORDER — CHLORHEXIDINE GLUCONATE 0.12 % MT SOLN
15.0000 mL | Freq: Once | OROMUCOSAL | Status: AC
  Administered 2022-06-07: 15 mL via OROMUCOSAL

## 2022-06-06 MED ORDER — LACTATED RINGERS IV SOLN: INTRAVENOUS | Status: DC

## 2022-06-07 ENCOUNTER — Ambulatory Visit: Admitting: Anesthesiology

## 2022-06-07 ENCOUNTER — Encounter: Payer: Self-pay | Admitting: Orthopedic Surgery

## 2022-06-07 ENCOUNTER — Other Ambulatory Visit: Payer: Self-pay

## 2022-06-07 ENCOUNTER — Ambulatory Visit

## 2022-06-07 ENCOUNTER — Observation Stay
Admission: RE | Admit: 2022-06-07 | Discharge: 2022-06-08 | Disposition: A | Discharge status: Home / Self Care | Attending: Orthopedic Surgery | Admitting: Orthopedic Surgery

## 2022-06-07 ENCOUNTER — Encounter
Admission: RE | Disposition: A | Discharge status: Home / Self Care | Payer: Self-pay | Source: Home / Self Care | Attending: Orthopedic Surgery

## 2022-06-07 DIAGNOSIS — M222X2 Patellofemoral disorders, left knee: Secondary | ICD-10-CM | POA: Diagnosis not present

## 2022-06-07 DIAGNOSIS — M238X9 Other internal derangements of unspecified knee: Secondary | ICD-10-CM | POA: Diagnosis present

## 2022-06-07 DIAGNOSIS — Z419 Encounter for procedure for purposes other than remedying health state, unspecified: Secondary | ICD-10-CM

## 2022-06-07 DIAGNOSIS — M2242 Chondromalacia patellae, left knee: Secondary | ICD-10-CM | POA: Diagnosis present

## 2022-06-07 DIAGNOSIS — Z01818 Encounter for other preprocedural examination: Secondary | ICD-10-CM

## 2022-06-07 HISTORY — PX: KNEE SURGERY: SHX244

## 2022-06-07 LAB — POCT PREGNANCY, URINE: Preg Test, Ur: NEGATIVE

## 2022-06-07 SURGERY — IMPLANTATION, MATRIX-INDUCED AUTOLOGOUS CHONDROCYTES
Anesthesia: General | Laterality: Left

## 2022-06-07 MED ORDER — MIDAZOLAM HCL 2 MG/2ML IJ SOLN
INTRAMUSCULAR | Status: AC
  Filled 2022-06-07: qty 2

## 2022-06-07 MED ORDER — FENTANYL CITRATE (PF) 100 MCG/2ML IJ SOLN
INTRAMUSCULAR | Status: DC | PRN
  Administered 2022-06-07 (×2): 50 ug via INTRAVENOUS

## 2022-06-07 MED ORDER — CEFAZOLIN SODIUM-DEXTROSE 2-4 GM/100ML-% IV SOLN: 2.0000 g | Freq: Four times a day (QID) | INTRAVENOUS | Status: DC

## 2022-06-07 MED ORDER — ACETAMINOPHEN 10 MG/ML IV SOLN
INTRAVENOUS | Status: AC
  Filled 2022-06-07: qty 100

## 2022-06-07 MED ORDER — OXYCODONE HCL 5 MG PO TABS
ORAL_TABLET | ORAL | Status: AC
  Filled 2022-06-07: qty 1

## 2022-06-07 MED ORDER — BUPIVACAINE LIPOSOME 1.3 % IJ SUSP
INTRAMUSCULAR | Status: AC
  Filled 2022-06-07: qty 10

## 2022-06-07 MED ORDER — DOCUSATE SODIUM 100 MG PO CAPS
ORAL_CAPSULE | ORAL | Status: AC
  Filled 2022-06-07: qty 1

## 2022-06-07 MED ORDER — LIDOCAINE HCL (PF) 2 % IJ SOLN
INTRAMUSCULAR | Status: AC
  Filled 2022-06-07: qty 5

## 2022-06-07 MED ORDER — ONDANSETRON HCL 4 MG/2ML IJ SOLN
INTRAMUSCULAR | Status: AC
  Filled 2022-06-07: qty 2

## 2022-06-07 MED ORDER — HYDROMORPHONE HCL 1 MG/ML IJ SOLN
INTRAMUSCULAR | Status: AC
  Filled 2022-06-07: qty 1

## 2022-06-07 MED ORDER — DOCUSATE SODIUM 100 MG PO CAPS
100.0000 mg | ORAL_CAPSULE | Freq: Two times a day (BID) | ORAL | Status: DC
  Administered 2022-06-07 – 2022-06-08 (×2): 100 mg via ORAL

## 2022-06-07 MED ORDER — HYDROMORPHONE HCL 1 MG/ML IJ SOLN
0.2500 mg | INTRAMUSCULAR | Status: DC | PRN
  Administered 2022-06-07 (×4): 0.5 mg via INTRAVENOUS

## 2022-06-07 MED ORDER — ONDANSETRON HCL 4 MG/2ML IJ SOLN: 4.0000 mg | Freq: Four times a day (QID) | INTRAMUSCULAR | Status: DC | PRN

## 2022-06-07 MED ORDER — METHOCARBAMOL 1000 MG/10ML IJ SOLN: 500.0000 mg | Freq: Four times a day (QID) | INTRAVENOUS | Status: DC | PRN

## 2022-06-07 MED ORDER — FAMOTIDINE 20 MG PO TABS
ORAL_TABLET | ORAL | Status: AC
  Filled 2022-06-07: qty 1

## 2022-06-07 MED ORDER — LABETALOL HCL 5 MG/ML IV SOLN: 10.0000 mg | INTRAVENOUS | Status: DC

## 2022-06-07 MED ORDER — PHENYLEPHRINE HCL (PRESSORS) 10 MG/ML IV SOLN
INTRAVENOUS | Status: DC | PRN
  Administered 2022-06-07: 80 ug via INTRAVENOUS
  Administered 2022-06-07: 120 ug via INTRAVENOUS
  Administered 2022-06-07 (×3): 80 ug via INTRAVENOUS

## 2022-06-07 MED ORDER — SODIUM CHLORIDE 0.9 % IV SOLN: INTRAVENOUS | Status: DC

## 2022-06-07 MED ORDER — OXYCODONE HCL 5 MG PO TABS
5.0000 mg | ORAL_TABLET | Freq: Once | ORAL | Status: AC | PRN
  Administered 2022-06-07: 5 mg via ORAL

## 2022-06-07 MED ORDER — OXYCODONE HCL 5 MG PO TABS: 10.0000 mg | ORAL_TABLET | ORAL | Status: DC | PRN

## 2022-06-07 MED ORDER — METHOCARBAMOL 500 MG PO TABS: 500.0000 mg | ORAL_TABLET | Freq: Four times a day (QID) | ORAL | Status: DC | PRN

## 2022-06-07 MED ORDER — BUPIVACAINE LIPOSOME 1.3 % IJ SUSP
INTRAMUSCULAR | Status: DC | PRN
  Administered 2022-06-07: 10 mL

## 2022-06-07 MED ORDER — ONDANSETRON HCL 4 MG/2ML IJ SOLN
INTRAMUSCULAR | Status: DC | PRN
  Administered 2022-06-07: 4 mg via INTRAVENOUS

## 2022-06-07 MED ORDER — OXYCODONE HCL 5 MG/5ML PO SOLN: 5.0000 mg | Freq: Once | ORAL | Status: AC | PRN

## 2022-06-07 MED ORDER — GELATIN ABSORBABLE 100 CM EX MISC
CUTANEOUS | Status: AC
  Filled 2022-06-07: qty 1

## 2022-06-07 MED ORDER — ONDANSETRON HCL 4 MG PO TABS
ORAL_TABLET | ORAL | Status: AC
  Filled 2022-06-07: qty 1

## 2022-06-07 MED ORDER — PROPOFOL 10 MG/ML IV BOLUS
INTRAVENOUS | Status: DC | PRN
  Administered 2022-06-07: 200 mg via INTRAVENOUS

## 2022-06-07 MED ORDER — DIPHENHYDRAMINE HCL 12.5 MG/5ML PO ELIX: 12.5000 mg | ORAL_SOLUTION | ORAL | Status: DC | PRN

## 2022-06-07 MED ORDER — THROMBIN 5000 UNITS EX SOLR
CUTANEOUS | Status: AC
  Filled 2022-06-07: qty 5000

## 2022-06-07 MED ORDER — HYDROMORPHONE HCL 1 MG/ML IJ SOLN: 0.2000 mg | INTRAMUSCULAR | Status: DC | PRN

## 2022-06-07 MED ORDER — EPHEDRINE 5 MG/ML INJ
INTRAVENOUS | Status: AC
  Filled 2022-06-07: qty 5

## 2022-06-07 MED ORDER — LIDOCAINE HCL (PF) 1 % IJ SOLN
INTRAMUSCULAR | Status: DC | PRN
  Administered 2022-06-07: 3 mL

## 2022-06-07 MED ORDER — GELATIN ABSORBABLE 100 EX MISC
CUTANEOUS | Status: DC | PRN
  Administered 2022-06-07: 1 via TOPICAL

## 2022-06-07 MED ORDER — TISSEEL 10 ML EX KIT
PACK | CUTANEOUS | Status: DC | PRN
  Administered 2022-06-07: 1 via TOPICAL

## 2022-06-07 MED ORDER — THROMBIN 5000 UNITS EX SOLR
INTRAVENOUS | Status: DC | PRN
  Administered 2022-06-07: 5 mL

## 2022-06-07 MED ORDER — CEFAZOLIN SODIUM-DEXTROSE 2-4 GM/100ML-% IV SOLN
INTRAVENOUS | Status: AC
  Filled 2022-06-07: qty 100

## 2022-06-07 MED ORDER — ACETAMINOPHEN 500 MG PO TABS
ORAL_TABLET | ORAL | Status: AC
  Filled 2022-06-07: qty 2

## 2022-06-07 MED ORDER — FENTANYL CITRATE (PF) 100 MCG/2ML IJ SOLN
INTRAMUSCULAR | Status: AC
  Filled 2022-06-07: qty 2

## 2022-06-07 MED ORDER — 0.9 % SODIUM CHLORIDE (POUR BTL) OPTIME
TOPICAL | Status: DC | PRN
  Administered 2022-06-07: 500 mL

## 2022-06-07 MED ORDER — LIDOCAINE HCL (PF) 1 % IJ SOLN
INTRAMUSCULAR | Status: AC
  Filled 2022-06-07: qty 5

## 2022-06-07 MED ORDER — SENNOSIDES-DOCUSATE SODIUM 8.6-50 MG PO TABS: 1.0000 | ORAL_TABLET | Freq: Every evening | ORAL | Status: DC | PRN

## 2022-06-07 MED ORDER — PROPOFOL 10 MG/ML IV BOLUS
INTRAVENOUS | Status: AC
  Filled 2022-06-07: qty 20

## 2022-06-07 MED ORDER — LIDOCAINE HCL (CARDIAC) PF 100 MG/5ML IV SOSY
PREFILLED_SYRINGE | INTRAVENOUS | Status: DC | PRN
  Administered 2022-06-07: 100 mg via INTRAVENOUS

## 2022-06-07 MED ORDER — EPHEDRINE SULFATE (PRESSORS) 50 MG/ML IJ SOLN
INTRAMUSCULAR | Status: DC | PRN
  Administered 2022-06-07 (×4): 5 mg via INTRAVENOUS

## 2022-06-07 MED ORDER — ACETAMINOPHEN 10 MG/ML IV SOLN
INTRAVENOUS | Status: DC | PRN
  Administered 2022-06-07: 1000 mg via INTRAVENOUS

## 2022-06-07 MED ORDER — MIDAZOLAM HCL 2 MG/2ML IJ SOLN
2.0000 mg | Freq: Once | INTRAMUSCULAR | Status: AC
  Administered 2022-06-07: 2 mg via INTRAVENOUS

## 2022-06-07 MED ORDER — BUPIVACAINE-EPINEPHRINE (PF) 0.5% -1:200000 IJ SOLN
INTRAMUSCULAR | Status: AC
  Filled 2022-06-07: qty 10

## 2022-06-07 MED ORDER — PHENYLEPHRINE 80 MCG/ML (10ML) SYRINGE FOR IV PUSH (FOR BLOOD PRESSURE SUPPORT)
PREFILLED_SYRINGE | INTRAVENOUS | Status: AC
  Filled 2022-06-07: qty 10

## 2022-06-07 MED ORDER — ASPIRIN 325 MG PO TBEC
325.0000 mg | DELAYED_RELEASE_TABLET | Freq: Every day | ORAL | Status: DC
  Administered 2022-06-08: 325 mg via ORAL

## 2022-06-07 MED ORDER — DEXAMETHASONE SODIUM PHOSPHATE 10 MG/ML IJ SOLN
INTRAMUSCULAR | Status: AC
  Filled 2022-06-07: qty 1

## 2022-06-07 MED ORDER — BUPIVACAINE HCL (PF) 0.5 % IJ SOLN
INTRAMUSCULAR | Status: AC
  Filled 2022-06-07: qty 30

## 2022-06-07 MED ORDER — OXYCODONE HCL 5 MG PO TABS
5.0000 mg | ORAL_TABLET | ORAL | Status: DC | PRN
  Administered 2022-06-07 – 2022-06-08 (×5): 5 mg via ORAL

## 2022-06-07 MED ORDER — BUPIVACAINE-EPINEPHRINE 0.5% -1:200000 IJ SOLN
INTRAMUSCULAR | Status: DC | PRN
  Administered 2022-06-07: 10 mL

## 2022-06-07 MED ORDER — ACETAMINOPHEN 500 MG PO TABS
1000.0000 mg | ORAL_TABLET | Freq: Three times a day (TID) | ORAL | Status: DC
  Administered 2022-06-07 – 2022-06-08 (×2): 1000 mg via ORAL

## 2022-06-07 MED ORDER — DEXAMETHASONE SODIUM PHOSPHATE 10 MG/ML IJ SOLN
INTRAMUSCULAR | Status: DC | PRN
  Administered 2022-06-07: 10 mg via INTRAVENOUS

## 2022-06-07 MED ORDER — SERTRALINE HCL 50 MG PO TABS
50.0000 mg | ORAL_TABLET | Freq: Every day | ORAL | Status: DC
  Administered 2022-06-08: 50 mg via ORAL

## 2022-06-07 MED ORDER — ONDANSETRON HCL 4 MG PO TABS
4.0000 mg | ORAL_TABLET | Freq: Four times a day (QID) | ORAL | Status: DC | PRN
  Administered 2022-06-07: 4 mg via ORAL

## 2022-06-07 MED ORDER — CHLORHEXIDINE GLUCONATE 0.12 % MT SOLN
OROMUCOSAL | Status: AC
  Filled 2022-06-07: qty 15

## 2022-06-07 SURGICAL SUPPLY — 85 items
APL PRP STRL LF DISP 70% ISPRP (MISCELLANEOUS) ×1
APL SWBSTK 6 STRL LF DISP (MISCELLANEOUS) ×2
APPLICATOR COTTON TIP 6 STRL (MISCELLANEOUS) ×2 IMPLANT
APPLICATOR COTTON TIP 6IN STRL (MISCELLANEOUS) ×2
BIT DRILL CANN QC 4.3X180 (BIT) IMPLANT
BIT DRILL Q COUPLING 4.5 (BIT) IMPLANT
BIT DRILL Q/COUPLING 1 (BIT) IMPLANT
BLADE OSCILLATING/SAGITTAL (BLADE)
BLADE SAW SGTL 13X75X1.27 (BLADE) ×1 IMPLANT
BLADE SURG 15 STRL LF DISP TIS (BLADE) ×1 IMPLANT
BLADE SURG 15 STRL SS (BLADE) ×1
BLADE SW THK.38XMED LNG THN (BLADE) ×1 IMPLANT
CHLORAPREP W/TINT 26 (MISCELLANEOUS) ×1 IMPLANT
COOLER POLAR GLACIER W/PUMP (MISCELLANEOUS) IMPLANT
COUNTER NEEDLE 20/40 LG (NEEDLE) ×1 IMPLANT
COVER MAYO STAND REUSABLE (DRAPES) ×1 IMPLANT
COVER PIN YLW 0.028-062 (MISCELLANEOUS) IMPLANT
CUFF TOURN SGL QUICK 24 (TOURNIQUET CUFF)
CUFF TOURN SGL QUICK 30 NS (TOURNIQUET CUFF) IMPLANT
CUFF TOURN SGL QUICK 34 (TOURNIQUET CUFF)
CUFF TRNQT CYL 24X4X16.5-23 (TOURNIQUET CUFF) IMPLANT
CUFF TRNQT CYL 34X4.125X (TOURNIQUET CUFF) IMPLANT
DRAPE 3/4 80X56 (DRAPES) ×2 IMPLANT
DRAPE C-ARM XRAY 36X54 (DRAPES) ×1 IMPLANT
DRAPE C-ARMOR (DRAPES) ×1 IMPLANT
DRAPE INCISE IOBAN 66X45 STRL (DRAPES) ×2 IMPLANT
DRILL BIT 4.3MM (BIT) ×1
DRSG OPSITE POSTOP 4X10 (GAUZE/BANDAGES/DRESSINGS) IMPLANT
DRSG TEGADERM 2-3/8X2-3/4 SM (GAUZE/BANDAGES/DRESSINGS) ×1 IMPLANT
DRSG TEGADERM 4X4.75 (GAUZE/BANDAGES/DRESSINGS) ×1 IMPLANT
ELECT REM PT RETURN 9FT ADLT (ELECTROSURGICAL) ×1
ELECTRODE REM PT RTRN 9FT ADLT (ELECTROSURGICAL) ×1 IMPLANT
GLOVE BIOGEL PI IND STRL 8 (GLOVE) ×2 IMPLANT
GLOVE SURG SYN 7.5 E (GLOVE) ×3 IMPLANT
GLOVE SURG SYN 7.5 PF PI (GLOVE) ×3 IMPLANT
GOWN STRL REUS W/ TWL LRG LVL3 (GOWN DISPOSABLE) ×1 IMPLANT
GOWN STRL REUS W/ TWL XL LVL3 (GOWN DISPOSABLE) ×2 IMPLANT
GOWN STRL REUS W/TWL LRG LVL3 (GOWN DISPOSABLE) ×1
GOWN STRL REUS W/TWL XL LVL3 (GOWN DISPOSABLE) ×2
GRADUATE 1200CC STRL 31836 (MISCELLANEOUS) ×2 IMPLANT
HEMOVAC 400CC 10FR (MISCELLANEOUS) ×2 IMPLANT
K-WIRE .062 (WIRE) ×2
K-WIRE FX6X.062X2 END TROC (WIRE) ×2
KIT TURNOVER KIT A (KITS) ×1 IMPLANT
KWIRE FX6X.062X2 END TROC (WIRE) IMPLANT
MACI AUTOLOGOUS CELL SCAFFOLD (Tissue) ×1 IMPLANT
MANIFOLD NEPTUNE II (INSTRUMENTS) ×1 IMPLANT
NDL KEITH SZ2.5 (NEEDLE) ×2 IMPLANT
NDL SPNL 18GX3.5 QUINCKE PK (NEEDLE) IMPLANT
NEEDLE SPNL 18GX3.5 QUINCKE PK (NEEDLE) IMPLANT
NS IRRIG 500ML POUR BTL (IV SOLUTION) IMPLANT
PACK TOTAL KNEE (MISCELLANEOUS) ×1 IMPLANT
PAD WRAPON POLAR KNEE (MISCELLANEOUS) IMPLANT
PAD WRAPON POLOR MULTI XL (MISCELLANEOUS) IMPLANT
PADDING CAST COTTON 6X4 STRL (CAST SUPPLIES) IMPLANT
PATTIES SURGICAL .5 X.5 (GAUZE/BANDAGES/DRESSINGS) ×1 IMPLANT
PULSAVAC PLUS IRRIG FAN TIP (DISPOSABLE)
SCAFFOLD CELL AUTOLOGOUS MACI (Tissue) IMPLANT
SCREW CORTEX ST 4.5X40 (Screw) IMPLANT
SCREW CORTEX ST 4.5X48 (Screw) IMPLANT
SCREW CORTEX ST 4.5X52 (Screw) IMPLANT
SEALANT FIBRIN TSSL HEMOSTAT 4 (Miscellaneous) IMPLANT
SLEEVE REMOTE CONTROL 5X12 (DRAPES) IMPLANT
SPONGE KITTNER 5P (MISCELLANEOUS) ×1 IMPLANT
SUCTION TUBE FRAZIER 10FR DISP (MISCELLANEOUS) ×1 IMPLANT
SUT BONE WAX W31G (SUTURE) ×1 IMPLANT
SUT CHROMIC 1-0 (SUTURE) IMPLANT
SUT MNCRL 4-0 (SUTURE) ×1
SUT MNCRL 4-0 27XMFL (SUTURE) ×1
SUT VIC AB 0 CT1 36 (SUTURE) ×2 IMPLANT
SUT VIC AB 2-0 CT2 27 (SUTURE) ×1 IMPLANT
SUTURE MNCRL 4-0 27XMF (SUTURE) IMPLANT
SYR 10ML LL (SYRINGE) ×1 IMPLANT
TIP FAN IRRIG PULSAVAC PLUS (DISPOSABLE) ×1 IMPLANT
TRAP FLUID SMOKE EVACUATOR (MISCELLANEOUS) ×1 IMPLANT
TRAY FOLEY SLVR 16FR LF STAT (SET/KITS/TRAYS/PACK) ×1 IMPLANT
TUBE SET DOUBLEFLO INFLOW (TUBING) ×1 IMPLANT
TUBE SET DOUBLEFLO OUTFLOW (TUBING) ×1 IMPLANT
WAND HAND CNTRL MULTIVAC 90 (MISCELLANEOUS) ×1 IMPLANT
WATER STERILE IRR 500ML POUR (IV SOLUTION) ×1 IMPLANT
WIRE Z .045 C-WIRE SPADE TIP (WIRE) ×2 IMPLANT
WIRE Z .062 C-WIRE SPADE TIP (WIRE) ×1 IMPLANT
WRAP-ON POLOR PAD MULTI XL (MISCELLANEOUS) ×1
WRAPON POLAR PAD KNEE (MISCELLANEOUS) ×1
WRAPON POLOR PAD MULTI XL (MISCELLANEOUS) ×1

## 2022-06-07 NOTE — Anesthesia Procedure Notes (Signed)
Procedure Name: LMA Insertion Date/Time: 06/07/2022 7:47 AM  Performed by: Loucinda Croy, [place], CRNAPre-anesthesia Checklist: Patient identified, Patient being monitored, Timeout performed, Emergency Drugs available and Suction available Patient Re-evaluated:Patient Re-evaluated prior to induction Oxygen Delivery Method: Circle system utilized Preoxygenation: Pre-oxygenation with 100% oxygen Induction Type: IV induction Ventilation: Mask ventilation without difficulty LMA: LMA inserted LMA Size: 4.0 Tube type: Oral Number of attempts: 1 Placement Confirmation: positive ETCO2 and breath sounds checked- equal and bilateral Tube secured with: Tape Dental Injury: Teeth and Oropharynx as per pre-operative assessment

## 2022-06-07 NOTE — Anesthesia Postprocedure Evaluation (Signed)
Anesthesia Post Note  Patient: Sydney James  Procedure(s) Performed: Left patella and trochlea MACI (matrix autologous chondrocyte implantation) and tibial tubercle osteotomy (Left)  Patient location during evaluation: PACU Anesthesia Type: General Level of consciousness: awake and alert Pain management: pain level controlled Vital Signs Assessment: post-procedure vital signs reviewed and stable Respiratory status: spontaneous breathing, nonlabored ventilation, respiratory function stable and patient connected to nasal cannula oxygen Cardiovascular status: blood pressure returned to baseline and stable Postop Assessment: no apparent nausea or vomiting Anesthetic complications: no   No notable events documented.   Last Vitals:  Vitals:   06/07/22 1200 06/07/22 1215  BP: 135/89 (!) 134/92  Pulse: 83 84  Resp: 15 16  Temp:  36.6 C  SpO2: 96% 94%    Last Pain:  Vitals:   06/07/22 1200  TempSrc:   PainSc: 6                  Louie Boston

## 2022-06-07 NOTE — Anesthesia Preprocedure Evaluation (Signed)
Anesthesia Evaluation  Patient identified by MRN, date of birth, ID band Patient awake    Reviewed: Allergy & Precautions, NPO status , Patient's Chart, lab work & pertinent test results  Airway Mallampati: II  TM Distance: >3 FB Neck ROM: full    Dental  (+) Teeth Intact   Pulmonary neg pulmonary ROS   Pulmonary exam normal        Cardiovascular hypertension, On Medications negative cardio ROS Normal cardiovascular exam     Neuro/Psych  Headaches PSYCHIATRIC DISORDERS Anxiety        GI/Hepatic Neg liver ROS,GERD  Controlled,,  Endo/Other  negative endocrine ROS    Renal/GU      Musculoskeletal   Abdominal   Peds  Hematology negative hematology ROS (+)   Anesthesia Other Findings Past Medical History: No date: GERD (gastroesophageal reflux disease) No date: Lumbar herniated disc No date: Migraine No date: Motion sickness     Comment:  passenger -  car No date: Preeclampsia No date: PVC's (premature ventricular contractions)     Comment:  during pregnancy No date: Raynaud's disease No date: Scoliosis     Comment:  Back brace for 2 years  Past Surgical History: 12/22/2017: CESAREAN SECTION; N/A     Comment:  Procedure: CESAREAN SECTION;  Surgeon: Linzie Collin, MD;  Location: [facility] OR[facility];  Service: Obstetrics;                Laterality: N/A;  Female Born @ [phone]   No date: DILATION AND CURETTAGE OF UTERUS 03/15/2016: DILATION AND EVACUATION; N/A     Comment:  Procedure: DILATATION AND EVACUATION;  Surgeon: Herold Harms, MD;  Location: [facility];  Service:               Gynecology;  Laterality: N/A; 07/18/2019: DILATION AND EVACUATION; N/A     Comment:  Procedure: DILATATION AND EVACUATION;  Surgeon: Hildred Laser, MD;  Location: [facility];  Service: Gynecology;                Laterality: N/A; No date: WISDOM TOOTH EXTRACTION  BMI    Body Mass Index: 32.58  kg/m      Reproductive/Obstetrics negative OB ROS                             Anesthesia Physical Anesthesia Plan  ASA: 2  Anesthesia Plan: General LMA   Post-op Pain Management: Minimal or no pain anticipated, Toradol IV (intra-op), Tylenol PO (pre-op) and Regional block*   Induction: Intravenous  PONV Risk Score and Plan: 3 and Dexamethasone, Ondansetron, Midazolam and Treatment may vary due to age or medical condition  Airway Management Planned: LMA  Additional Equipment:   Intra-op Plan:   Post-operative Plan: Extubation in OR  Informed Consent: I have reviewed the patients History and Physical, chart, labs and discussed the procedure including the risks, benefits and alternatives for the proposed anesthesia with the patient or authorized representative who has indicated his/her understanding and acceptance.     Dental Advisory Given  Plan Discussed with: Anesthesiologist, CRNA and Surgeon  Anesthesia Plan Comments: (Patient consented for risks of anesthesia including but not limited to:  - adverse reactions to medications - damage to  eyes, teeth, lips or other oral mucosa - nerve damage due to positioning  - sore throat or hoarseness - Damage to heart, brain, nerves, lungs, other parts of body or loss of life  Patient voiced understanding.)        Anesthesia Quick Evaluation

## 2022-06-07 NOTE — Anesthesia Procedure Notes (Signed)
Anesthesia Regional Block: Adductor canal block   Pre-Anesthetic Checklist: , timeout performed,  Correct Patient, Correct Site, Correct Laterality,  Correct Procedure, Correct Position, site marked,  Risks and benefits discussed,  Surgical consent,  Pre-op evaluation,  At surgeon's request and post-op pain management  Laterality: Lower and Left  Prep: chloraprep       Needles:  Injection technique: Single-shot  Needle Type: Echogenic Needle     Needle Length: 9cm  Needle Gauge: 21     Additional Needles:   Procedures:,,,, ultrasound used (permanent image in chart),,    Narrative:  Start time: 06/07/2022 7:30 AM End time: 06/07/2022 7:33 AM Injection made incrementally with aspirations every 5 mL.  Performed by: Personally  Anesthesiologist: Louie Boston, MD  Additional Notes: Patient's chart reviewed and they were deemed appropriate candidate for procedure, per surgeon's request. Patient educated about risks, benefits, and alternatives of the block including but not limited to: temporary or permanent nerve damage, bleeding, infection, damage to surround tissues, block failure, local anesthetic toxicity. Patient expressed understanding. A formal time-out was conducted consistent with institution rules.  Monitors were applied, and minimal sedation used (see nursing record). The site was prepped with skin prep and allowed to dry, and sterile gloves were used. A high frequency linear ultrasound probe with probe cover was utilized throughout. Femoral artery visualized at mid-thigh level, local anesthetic injected anterolateral to it, and echogenic block needle trajectory was monitored throughout. Hydrodissection of saphenous nerve visualized and appeared anatomically normal. Aspiration performed every 5ml. Blood vessels were avoided. All injections were performed without resistance and free of blood and paresthesias. The patient tolerated the procedure well.  Injectate: 10cc Exparel

## 2022-06-07 NOTE — Transfer of Care (Signed)
Immediate Anesthesia Transfer of Care Note  Patient: Sydney James  Procedure(s) Performed: Left patella and trochlea MACI (matrix autologous chondrocyte implantation) and tibial tubercle osteotomy (Left)  Patient Location: PACU  Anesthesia Type:General  Level of Consciousness: drowsy  Airway & Oxygen Therapy: Patient Spontanous Breathing and Patient connected to face mask oxygen  Post-op Assessment: Report given to RN and Post -op Vital signs reviewed and stable  Post vital signs: Reviewed and stable  Last Vitals:  Vitals Value Taken Time  BP 140/83 06/07/22 1105  Temp 97.0   Pulse 75 06/07/22 1110  Resp 13 06/07/22 1110  SpO2 100 % 06/07/22 1110  Vitals shown include unvalidated device data.  Last Pain:  Vitals:   06/07/22 0734  TempSrc:   PainSc: 0-No pain         Complications: No notable events documented.

## 2022-06-07 NOTE — Evaluation (Signed)
Physical Therapy Evaluation Patient Details Name: Sydney James MRN: [id number] DOB: [birth year] Today's Date: 06/07/2022  History of Present Illness  Sydney James is a 26yoF who comes to [facility] or scheduled Left knee repair. Pt is NWB and in a locked hinged knee brace. CPM already delivered to home. Scheduled to begin OPPT next week.  Clinical Impression  Pt in bed on entry, agreeable to session. Husband at bedside. Pt had significant pain earlier, has since had pain pill with big improvement. Pt/husband educated on use of brace, CPM, polar care. MinA for supine to/from EOB. EOB tolerance is generally good initially, but limited greatly in time due to strong sympathic response while seated- starts having hot flash within 60sec, then nauseated, then concerningly lightheaded. Author/husband assist to pt to supine, where her symptoms improve rapidly. RN made aware. We discuss plan to use AC at DC, as per her personal preference and preop experience. Pt agreeable to use RW this evening when mobilizing with NSG. Pt educated on how much easier Left NWB will be if she has a Rt shoe in place. Will plan to see early tomorrow for full mobility training in prep for DC.     Recommendations for follow up therapy are one component of a multi-disciplinary discharge planning process, led by the attending physician.  Recommendations may be updated based on patient status, additional functional criteria and insurance authorization.  Follow Up Recommendations       Assistance Recommended at Discharge Intermittent Supervision/Assistance  Patient can return home with the following  Assist for transportation;Help with stairs or ramp for entrance    Equipment Recommendations BSC/3in1  Recommendations for Other Services       Functional Status Assessment Patient has had a recent decline in their functional status and demonstrates the ability to make significant improvements in function in a reasonable and  predictable amount of time.     Precautions / Restrictions Precautions Precautions: Fall;Knee Precaution Booklet Issued: No Precaution Comments: no flexion, needs brace locked Restrictions Weight Bearing Restrictions: Yes LLE Weight Bearing: Weight bearing as tolerated      Mobility  Bed Mobility Overal bed mobility: Needs Assistance Bed Mobility: Supine to Sit, Sit to Supine     Supine to sit: Min assist Sit to supine: Min assist   General bed mobility comments: minA of LLE to provide support due to locked knee brace and high pain    Transfers Overall transfer level:  (deferred, pt has a postoperative sympathetic response at EOB within 2 minutes, requires rapid return to supine for symptoms recovery.)                      Ambulation/Gait                  Stairs            Wheelchair Mobility    Modified Rankin (Stroke Patients Only)       Balance                                             Pertinent Vitals/Pain Pain Assessment Pain Assessment: 0-10 Pain Score: 5  Pain Location: left knee Pain Intervention(s): Limited activity within patient's tolerance, Monitored during session, Repositioned, Premedicated before session    Home Living Family/patient expects to be discharged to:: Private residence Living Arrangements: Spouse/significant other;Children (3 children 7yo, 4yo,  96mo) Available Help at Discharge: Family Type of Home: House Home Access: Stairs to enter Entrance Stairs-Rails: None Entrance Stairs-Number of Steps: 6   Home Layout: One level Home Equipment: Crutches;Shower seat Additional Comments: polar care;    Prior Function Prior Level of Function : Independent/Modified Independent;Driving;Working/employed                     Hand Dominance        Extremity/Trunk Assessment                Communication      Cognition Arousal/Alertness: Awake/alert Behavior During Therapy: WFL  for tasks assessed/performed Overall Cognitive Status: Within Functional Limits for tasks assessed                                          General Comments      Exercises Other Exercises Other Exercises: polar care, KI education with pt and husband Clifton Custard   Assessment/Plan    PT Assessment Patient needs continued PT services  PT Problem List Decreased range of motion;Decreased activity tolerance;Decreased mobility;Decreased knowledge of precautions       PT Treatment Interventions DME instruction;Gait training;Stair training;Functional mobility training;Therapeutic activities;Therapeutic exercise;Balance training;Patient/family education    PT Goals (Current goals can be found in the Care Plan section)  Acute Rehab PT Goals Patient Stated Goal: return to home with [facility] use PT Goal Formulation: With patient Time For Goal Achievement: 06/21/22 Potential to Achieve Goals: Good    Frequency BID     Co-evaluation               AM-PAC PT "6 Clicks" Mobility  Outcome Measure Help needed turning from your back to your side while in a flat bed without using bedrails?: A Lot Help needed moving from lying on your back to sitting on the side of a flat bed without using bedrails?: A Lot Help needed moving to and from a bed to a chair (including a wheelchair)?: A Little Help needed standing up from a chair using your arms (e.g., wheelchair or bedside chair)?: A Little Help needed to walk in hospital room?: A Little Help needed climbing 3-5 steps with a railing? : A Lot 6 Click Score: 15    End of Session Equipment Utilized During Treatment: Left knee immobilizer Activity Tolerance: Treatment limited secondary to medical complications (Comment);No increased pain Patient left: in bed;with family/visitor present;with call bell/phone within reach;with nursing/sitter in room Nurse Communication: Mobility status;Precautions PT Visit Diagnosis: Difficulty in walking,  not elsewhere classified (R26.2);Unsteadiness on feet (R26.81);Other abnormalities of gait and mobility (R26.89)    Time: 5409-8119 PT Time Calculation (min) (ACUTE ONLY): 30 min   Charges:   PT Evaluation $PT Eval Low Complexity: 1 Low PT Treatments $Self Care/Home Management: 8-22       4:14 PM, 06/07/22 Rosamaria Lints, PT, DPT Physical Therapist - [facility]  [phone] (ASCOM)    Lebron Nauert C 06/07/2022, 4:10 PM

## 2022-06-07 NOTE — Op Note (Signed)
OPERATIVE NOTE  06/07/2022   PRE-OP DIAGNOSIS:  1. Left patella chondral defect 2. Left trochlea chondral defect 3. Left knee patellofemoral malalignment   POST-OP DIAGNOSIS:  1. Left patella chondral defect 2. Left trochlea chondral defect 3. Left knee patellofemoral malalignment  PROCEDURES:  1. Left patella and trochlea matrix autologous chondrocyte implantation 2. Left knee tibial tubercle osteotomy  SURGEON:Doron Shake Molinda Bailiff, MD  ASSISTANT(S): J.Todd Lenard Forth, [place]; Marlene Bast Minor, PA-S   ANESTHESIA: Gen  TOTAL IV FLUIDS: see anesthesia record  ESTIMATED BLOOD LOSS: 100cc  TOURNIQUET TIME: 68 min  DRAINS: medium 10 Fr Hemovac  SPECIMENS: None.  IMPLANTS:  4.21mm cortical screws x 3 MACI membrane x 2  COMPLICATIONS: None apparent.  INDICATIONS: Sydney James is a 27 y.o. female with patellofemoral pain. Subsequent MRI showed a full-thickness chondral defect of the patella with underlying reactive bone marrow edema.  The patient had patellar malalignment with TT-TG distance of 11 mm. Given the patient's activity level and full-thickness large chondral defect, surgery was recommended for autologous chondrocyte implantation of the patella and tibial tubercle osteotomy to improve patella alignment and unload the trochlea and patellofemoral joint.  Prior arthroscopy had been performed confirming the size of the lesion and full-thickness nature. Arthroscopy also showed a small, full-thickness defect of the medial trochlea.  MACI biopsy was continued at this time.  This is a planned staged procedure for MACI implantation to the patella and trochlea with tibial tubercle osteotomy to unload the patellofemoral joint.  DETAILS OF PROCEDURE: The patient was identified in the preoperative holding area and the operative leg was marked.  The patient was brought to the operating room.  She was placed supine on the OR table.  General anesthesia was administered.  A bump was placed under the hip  and a tourniquet was placed on the thigh. The operative extremity was prescrubbed with Hibiclens and alcohol, prepped with ChloraPrep, and draped in the usual sterile fashion. The patient was given preoperative IV antibiotics within 30 minutes of the start of the case and a surgical time-out confirming patient identity, procedure, and laterality was performed.   A longitudinal incision was marked from just proximal patella to ~8cm distal to the tibial tubercle.  The leg was elevated and exsanguinated with an Esmarch bandage and tourniquet inflated to 250 mmHg.  A 10 blade was used to make the long central knee incision from the proximal pole of the patella to 8cm distal to the tibial tubercle.  Medial and lateral flaps were developed.    Next a tibial tubercle osteotomy was performed. The anterior compartment musculature was released subperiosteally and protected.  Guidepins were placed at ~45 degree angle and were used to make the osteotomy.  The distal aspect of the shingle was left intact.  Osteotomes were used proximally to finish the lateral and medial cuts on the shingle.    A medial parapatellar arthrotomy was performed from just proximal to the patella through the quadriceps tendon along the patella down to the insertion of the patellar tendon on the tubercle.  The focal chondral defect on the medial aspect of the trochlea measured approximately 0.8 (medial-lateral) x 0.5cm (proximal-distal), but was full-thickness.  The cutting guides were too large and would have caused removal of a large portion of healthy cartilage.  Therefore, decision was made to create a foil template manually.  The defect was prepared by using combination of a rongeur and curette to remove unhealthy cartilage with vertical and stable surrounding walls.  Care was taken  to ensure that the subchondral bone was not violated.  Full template was cut to size.    Next, the patella was then everted.  The patella was inspected and  noted to have a grade 4 lesion on the medial facet extending to the median ridge. The defect measured 1.8cm (medial-lateral) x 1.0cm (proximal-distal). An oval 13mm x 20mm cutting guide allowed for appropriate removal of damaged cartilage without removal of significant healthy cartilage.  This was then impacted into place on the patella and an outline was created.  Damaged cartilage within this outline was then removed using a combination of a rongeur and curette until all damaged cartilage had been removed.  Care was taken to ensure that the subchondral bone was not violated.  The tourniquet was lowered at 68 minutes and appropriate hemostasis was achieved.  There was no active bleeding from the defect beds.  The wound was thoroughly irrigated. The MACI membrane was placed cell side up over the Esmarch template and not touched with hands or forceps. The membrane was then cut with the same cutting guide that had been used previously for the patellar implant.  Using the full template and scissors, the trochlear MACI implant was created.    Tisseel glue was applied over the subchondral bone of the trochlea. The membrane with chondrocytes was placed over the bone defect, cell side down toward bone.   This was held for 3 minutes to allow the Tisseel glue to set.  Next, Tisseel glue was applied to the edges of the lesion/membrane. The membrane was held in this position for 3 minutes to allow the Tisseel glue to set.  This was repeated for the patellar membrane.  The patella was then gently reduced.  The proximal aspect of the tibial tubercle was then elevated and shifted medially ~10 mm, which also anteriorized the shingle 10 mm.  Three, fully-threaded cortical screws, 4.5 mm in diameter were then placed in a lag fashion.  The countersink was used to limit the prominence of the screw heads.   There was no unusual bleeding. There was some oozing from the tibial tubercle osteotomy site laterally and bone wax was  applied in a thin layer to limit cancellous bleeding.  The medial overhang of the tibial tubercle osteotomy was smoothed with a rongeur.  A medium Hemovac drain was placed along the tibial tubercle osteotomy site with care taken to ensure that the drain remained distal to the knee joint. The parapatellar arthrotomy was closed with figure-of-eight stitches of 0 Vicryl. The midline knee incision was then closed with interrupted, inverted, 2-0 Vicryl sutures in the subdermal layer and then 4-0 Monocryl in a running, subcuticular fashion.  Dermabond and honeycomb dressing was applied. Leg was wrapped in cotton and bias wrap.  Polar Care and hinged knee brace locked at 0 degrees was applied.  The patient was brought to PACU in stable condition.  Instrument, sponge, and needle counts were correct prior to wound closure and at the conclusion of the case.   Of note, assistance from a PA was essential to performing the surgery.  PA was present for the entire surgery.  PA assisted with patient positioning, retraction, instrumentation, and wound closure. The surgery would have been more difficult and had longer operative time without PA assistance.          DISPOSITION: PACU - hemodynamically stable.  POSTOPERATIVE PLAN: The patient will be discharged home tomorrow after overnight stay for pain control, drain monitoring as well as monitoring for  compartment syndrome.  Antibiotics will be given overnight, but discontinued within 24 hours of surgery.  The patient will be non-weight bearing for 4 weeks, and then may 50% weight bear from weeks 4 to 6, and may fully weight bear at week 6.  Brace will be locked for ambulation for the first 8 weeks using 2 crutches.    Physical therapy to begin post-op day 3-4 for knee range of motion, patellar mobilization and scar mobilization.  Utilize patellofemoral MACI + tibial tubercle osteotomy rehab protocol.  Return to clinic 10-14 days as scheduled.

## 2022-06-07 NOTE — H&P (Signed)
Paper H&P to be scanned into permanent record. H&P reviewed. No significant changes noted.  

## 2022-06-08 ENCOUNTER — Encounter: Payer: Self-pay | Admitting: Orthopedic Surgery

## 2022-06-08 DIAGNOSIS — M2242 Chondromalacia patellae, left knee: Secondary | ICD-10-CM | POA: Diagnosis not present

## 2022-06-08 MED ORDER — DOCUSATE SODIUM 100 MG PO CAPS
ORAL_CAPSULE | ORAL | Status: AC
  Filled 2022-06-08: qty 1

## 2022-06-08 MED ORDER — ACETAMINOPHEN 500 MG PO TABS
ORAL_TABLET | ORAL | Status: AC
  Filled 2022-06-08: qty 2

## 2022-06-08 MED ORDER — SERTRALINE HCL 50 MG PO TABS
ORAL_TABLET | ORAL | Status: AC
  Filled 2022-06-08: qty 1

## 2022-06-08 MED ORDER — ONDANSETRON HCL 4 MG PO TABS: 4.0000 mg | ORAL_TABLET | Freq: Four times a day (QID) | ORAL | 0 refills | Status: DC | PRN

## 2022-06-08 MED ORDER — ASPIRIN 325 MG PO TBEC
DELAYED_RELEASE_TABLET | ORAL | Status: AC
  Filled 2022-06-08: qty 1

## 2022-06-08 MED ORDER — OXYCODONE HCL 5 MG PO TABS: 5.0000 mg | ORAL_TABLET | ORAL | 0 refills | Status: DC | PRN

## 2022-06-08 MED ORDER — ASPIRIN 325 MG PO TBEC: 325.0000 mg | DELAYED_RELEASE_TABLET | Freq: Every day | ORAL | 0 refills | Status: AC

## 2022-06-08 MED ORDER — OXYCODONE HCL 5 MG PO TABS
ORAL_TABLET | ORAL | Status: AC
  Filled 2022-06-08: qty 1

## 2022-06-08 MED ORDER — METHOCARBAMOL 500 MG PO TABS: 500.0000 mg | ORAL_TABLET | Freq: Four times a day (QID) | ORAL | 0 refills | Status: DC | PRN

## 2022-06-08 NOTE — Discharge Instructions (Signed)
Post-Op Instructions  1. Bracing or crutches: You will be provided with a long brace (from hip to ankle) and crutches at the surgery center.   2. Ice: You will be provided with a device (Polar Care) that allows you to ice the affected area effectively.   3. Showering: Incision must remain dry for 5 days. Afterwards, you may shower and gently pat incision dry. Steri-strips will eventually fall off on their own. NO submerging wound for 4 weeks. There is an absorbable stitch and you may see the ends of the stitch. If applicable, these will be removed at your first post-operative appointment in 2 weeks.   4. Driving: You will be given specific driving precautions at discharge. Plan on not driving for at least one week for left knee surgery, and 4 weeks for right knee surgery if you are restricted due to the brace and knee motion. Please note that you are advised NOT to drive while taking narcotic pain medications as you may be impaired and unsafe to drive.  5. Activity: Weight bearing: No weight bearing on the affected leg for 4 weeks, then 50% weight bearing for 2 weeks, then full weight bearing at 6 weeks. Bending the knee is limited and will be guided by the physical therapist. Elevate knee above heart level as much as possible for one week. Avoid standing more than 5 minutes (consecutively) for the first week. No exercise involving the knee until cleared by the surgeon or physical therapist.  Avoid long distance travel for 4 weeks.  6. Medications: - You have been provided a prescription for narcotic pain medicine. After surgery, take 1-2 narcotic tablets every 4 hours if needed for severe pain.  - A prescription for anti-nausea medication will be provided in case the narcotic medicine causes nausea - take 1 tablet every 6 hours only if nauseated.  - Take enteric coated aspirin 325 mg once daily for 4 weeks to prevent blood clots.  -Take tylenol 1000 every 8 hours for pain.  May stop tylenol 3 days  after surgery or when you are having minimal pain. -DO NOT TAKE IBUPROFEN, ALEVE or OTHER NSAIDs as they can interfere with bone healing.  -Take Citracal Maximum Strength (calcium citrate + vitamin D), 2 tabs daily.  If you are taking prescription medication for anxiety, depression, insomnia, muscle spasm, chronic pain, or for attention deficit disorder, you are advised that you are at a higher risk of adverse effects with use of narcotics post-op, including narcotic addiction/dependence, depressed breathing, death. If you use non-prescribed substances: alcohol, marijuana, cocaine, heroin, methamphetamines, etc., you are at a higher risk of adverse effects with use of narcotics post-op, including narcotic addiction/dependence, depressed breathing, death. You are advised that taking > 50 morphine milligram equivalents (MME) of narcotic pain medication per day results in twice the risk of overdose or death. For your prescription provided: oxycodone 5 mg - taking more than 6 tablets per day would result in > 50 morphine milligram equivalents (MME) of narcotic pain medication. Be advised that we will prescribe narcotics short-term, for acute post-operative pain, only 3 weeks for major operations such as knee repair/reconstruction surgeries.   7. Bandages: The physical therapist should change the bandages at the first post-op appointment. If needed, the dressing supplies have been provided to you.  8. Physical Therapy: 2 times per week for the first 4 weeks, then 1-2 times per week from weeks 4-8 post-op. Therapy typically starts on post operative Day 3 or 4. You have been   provided an order for physical therapy today and should schedule your appointments in advance to avoid delay. The therapist will provide home exercises.  9. Work or School: For most, but not all procedures, we advise staying out of work or school for at least 1 to 2 weeks in order to recover from the stress of surgery and to allow time for  healing and swelling control. If you need a work or school note this can be provided.   10. Post-Op Appointments: Your first post-op appointment will be with Dr. Patel in approximately 2 weeks time. Please double check if this will be at the [place] facility (Tuesdays and Thursdays) or [place] facility (Wednesdays).    If you find that they have not been scheduled please call the Orthopaedic Appointment front desk at [phone].  

## 2022-06-08 NOTE — Progress Notes (Signed)
Physical Therapy Treatment Patient Details Name: Sydney James MRN: [id number] DOB: 11-23-1995 Today's Date: 06/08/2022   History of Present Illness Sydney James is a 26yoF who comes to [facility] or scheduled Left knee repair. Pt is NWB and in a locked hinged knee brace. CPM already delivered to home. Scheduled to begin OPPT next week.    PT Comments    Pt finished breakfast prior to entry, reports successful mobility with NSG to BR using RW, no additional episodes of PONV. Pt demonstrates modI bed mobility, transfers, and AMB. Is provided min guard assist for stairs training. Pt up in recliner at end of session. Still waiting on [facility] and will see OT for evaluation this morning. All education on precautions, restrictions, brace, polar care, and CPM completed.   Recommendations for follow up therapy are one component of a multi-disciplinary discharge planning process, led by the attending physician.  Recommendations may be updated based on patient status, additional functional criteria and insurance authorization.  Follow Up Recommendations       Assistance Recommended at Discharge Intermittent Supervision/Assistance  Patient can return home with the following Assist for transportation;Help with stairs or ramp for entrance   Equipment Recommendations  BSC/3in1    Recommendations for Other Services       Precautions / Restrictions Precautions Precautions: Fall;Knee Precaution Booklet Issued: No Precaution Comments: no flexion, needs brace locked Restrictions LLE Weight Bearing: Non weight bearing     Mobility  Bed Mobility Overal bed mobility: Needs Assistance Bed Mobility: Supine to Sit     Supine to sit: Modified independent (Device/Increase time), HOB elevated     General bed mobility comments: minA of LLE to provide support due to locked knee brace and high pain    Transfers Overall transfer level: Modified independent Equipment used: Crutches                General transfer comment: from EOB x2, from recliner    Ambulation/Gait Ambulation/Gait assistance: Min guard, Supervision Gait Distance (Feet): 220 Feet Assistive device: Crutches Gait Pattern/deviations: Step-through pattern       General Gait Details: no LOB; already familiar with AC mobility PTA. Pt does well in general.   Stairs Stairs: Yes Stairs assistance: Min guard Stair Management: With crutches, No rails Number of Stairs: 8 (4 up, 4 down) General stair comments: torso angled 45 degrees left as to accomodate Left toe clearance in locked knee brace; AC placement same as direct anterior approach.   Wheelchair Mobility    Modified Rankin (Stroke Patients Only)       Balance Overall balance assessment: Modified Independent                                          Cognition Arousal/Alertness: Awake/alert Behavior During Therapy: WFL for tasks assessed/performed Overall Cognitive Status: Within Functional Limits for tasks assessed                                          Exercises      General Comments        Pertinent Vitals/Pain Pain Assessment Pain Assessment: 0-10 Pain Score: 3  Pain Location: left knee; achy sore Pain Descriptors / Indicators: Aching Pain Intervention(s): Limited activity within patient's tolerance, Monitored during session    Home Living  Prior Function            PT Goals (current goals can now be found in the care plan section) Acute Rehab PT Goals Patient Stated Goal: return to home with [facility] use PT Goal Formulation: With patient Time For Goal Achievement: 06/21/22 Potential to Achieve Goals: Good Progress towards PT goals: Progressing toward goals    Frequency    BID      PT Plan Current plan remains appropriate    Co-evaluation              AM-PAC PT "6 Clicks" Mobility   Outcome Measure  Help needed turning from your back to  your side while in a flat bed without using bedrails?: A Lot Help needed moving from lying on your back to sitting on the side of a flat bed without using bedrails?: A Lot Help needed moving to and from a bed to a chair (including a wheelchair)?: A Little Help needed standing up from a chair using your arms (e.g., wheelchair or bedside chair)?: A Little Help needed to walk in hospital room?: A Little Help needed climbing 3-5 steps with a railing? : A Lot 6 Click Score: 15    End of Session Equipment Utilized During Treatment: Left knee immobilizer;Gait belt Activity Tolerance: No increased pain Patient left: with call bell/phone within reach;with nursing/sitter in room;in chair Nurse Communication: Mobility status;Precautions PT Visit Diagnosis: Difficulty in walking, not elsewhere classified (R26.2);Unsteadiness on feet (R26.81);Other abnormalities of gait and mobility (R26.89)     Time: 2130-8657 PT Time Calculation (min) (ACUTE ONLY): 26 min  Charges:  $Gait Training: 23-37 mins                    9:05 AM, 06/08/22 Rosamaria Lints, PT, DPT Physical Therapist - [facility]  [phone] (ASCOM)    Sydney James 06/08/2022, 9:03 AM

## 2022-06-08 NOTE — Progress Notes (Signed)
DISCHARGE NOTE:  Pt given discharge instructions and scripts. Pt verbalized understanding. [facility] sent with pt. Pt wheeled to car by staff, husband providing transportation.

## 2022-06-08 NOTE — Plan of Care (Signed)
  Problem: Activity: Goal: Risk for activity intolerance will decrease Outcome: Progressing   Problem: Nutrition: Goal: Adequate nutrition will be maintained Outcome: Progressing   Problem: Elimination: Goal: Will not experience complications related to urinary retention Outcome: Progressing   Problem: Pain Managment: Goal: General experience of comfort will improve Outcome: Progressing   Problem: Safety: Goal: Ability to remain free from injury will improve Outcome: Progressing   

## 2022-06-08 NOTE — Progress Notes (Signed)
The patient was discharged via wheelchair with immobilizer in place by nursing staff. The patient was discharged with personal belongings, polar care, crutches and bedside commode. Discharge instructions reviewed, all questions answered.
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
## 2022-06-08 NOTE — TOC Progression Note (Signed)
Transition of Care [facility]) - Progression Note    Patient Details  Name: Sydney James MRN: [id number] Date of Birth: Aug 13, 1995  Transition of Care [facility]) CM/SW Contact  Marlowe Sax, RN Phone Number: 06/08/2022, 9:47 AM  Clinical Narrative:   The patient will have a 3 in 1 delivered by [employer]  DME, She will do outpatient PT already discussed with the physician         Expected Discharge Plan and Services         Expected Discharge Date: 06/08/22                                     Social Determinants of Health (SDOH) Interventions SDOH Screenings   Food Insecurity: No Food Insecurity (06/07/2022)  Housing: Low Risk  (06/07/2022)  Transportation Needs: No Transportation Needs (06/07/2022)  Utilities: Not At Risk (06/07/2022)  Depression (PHQ2-9): High Risk (02/09/2022)  Financial Resource Strain: Low Risk  (12/19/2017)  Physical Activity: Inactive (12/19/2017)  Social Connections: Moderately Integrated (12/19/2017)  Stress: No Stress Concern Present (12/19/2017)  Tobacco Use: Low Risk  (06/07/2022)    Readmission Risk Interventions     No data to display

## 2022-06-08 NOTE — Progress Notes (Signed)
Patient is not able to walk the distance required to go the bathroom, or he/she is unable to safely negotiate stairs required to access the bathroom.  A 3in1 BSC will alleviate this problem  

## 2022-06-08 NOTE — Evaluation (Signed)
Occupational Therapy Evaluation Patient Details Name: Sydney James MRN: [id number] DOB: [birth year] Today's Date: 06/08/2022   History of Present Illness Sydney James is a 26yoF who comes to [facility] or scheduled Left knee repair. Pt is NWB and in a locked hinged knee brace. CPM already delivered to home. Scheduled to begin OPPT next week.   Clinical Impression   Sydney James was seen for OT evaluation this date. Prior to hospital admission, pt was IND. Pt lives with spouse and 3 children. Pt currently MOD I donning shorts and shirt, good balance standing using crutches for pulling shorts up over rear. Educated on polar care, DME recs, and adapted dressing strategies. Al education complete, will sign off. Upon hospital discharge, recommend no OT follow up.   Recommendations for follow up therapy are one component of a multi-disciplinary discharge planning process, led by the attending physician.  Recommendations may be updated based on patient status, additional functional criteria and insurance authorization.   Assistance Recommended at Discharge Set up Supervision/Assistance  Patient can return home with the following A little help with walking and/or transfers;A little help with bathing/dressing/bathroom;Help with stairs or ramp for entrance    Functional Status Assessment  Patient has had a recent decline in their functional status and demonstrates the ability to make significant improvements in function in a reasonable and predictable amount of time.  Equipment Recommendations  BSC/3in1    Recommendations for Other Services       Precautions / Restrictions Precautions Precautions: Fall;Knee Precaution Booklet Issued: No Precaution Comments: brace locked in extension Restrictions Weight Bearing Restrictions: Yes LLE Weight Bearing: Non weight bearing      Mobility Bed Mobility Overal bed mobility: Independent                  Transfers Overall transfer level:  Modified independent Equipment used: Crutches                      Balance Overall balance assessment: Modified Independent                                         ADL either performed or assessed with clinical judgement   ADL Overall ADL's : Independent                                              Pertinent Vitals/Pain Pain Assessment Pain Assessment: 0-10 Pain Score: 3  Pain Location: L knee Pain Descriptors / Indicators: Aching Pain Intervention(s): Limited activity within patient's tolerance     Hand Dominance     Extremity/Trunk Assessment Upper Extremity Assessment Upper Extremity Assessment: Overall WFL for tasks assessed   Lower Extremity Assessment Lower Extremity Assessment: Overall WFL for tasks assessed       Communication Communication Communication: No difficulties   Cognition Arousal/Alertness: Awake/alert Behavior During Therapy: WFL for tasks assessed/performed Overall Cognitive Status: Within Functional Limits for tasks assessed                                        Home Living Family/patient expects to be discharged to:: Private residence Living Arrangements: Spouse/significant other;Children (3 children 7yo, 4yo, 38mo) Available Help at  Discharge: Family Type of Home: House Home Access: Stairs to enter [employer] of Steps: 6 Entrance Stairs-Rails: None Home Layout: One level     Bathroom Shower/Tub: Tub/shower unit;Curtain         Home Equipment: Crutches;Shower seat   Additional Comments: polar care;      Prior Functioning/Environment Prior Level of Function : Independent/Modified Independent;Driving;Working/employed                        OT Problem List: Decreased activity tolerance         OT Goals(Current goals can be found in the care plan section) Acute Rehab OT Goals Patient Stated Goal: to go home OT Goal Formulation: With  patient Time For Goal Achievement: 06/22/22 Potential to Achieve Goals: Good   AM-PAC OT "6 Clicks" Daily Activity     Outcome Measure Help from another person eating meals?: None Help from another person taking care of personal grooming?: None Help from another person toileting, which includes using toliet, bedpan, or urinal?: None Help from another person bathing (including washing, rinsing, drying)?: None Help from another person to put on and taking off regular upper body clothing?: None Help from another person to put on and taking off regular lower body clothing?: None 6 Click Score: 24   End of Session    Activity Tolerance: Patient tolerated treatment well Patient left: in chair  OT Visit Diagnosis: Other abnormalities of gait and mobility (R26.89)                Time: 5284-1324 OT Time Calculation (min): 14 min Charges:  OT General Charges $OT Visit: 1 Visit OT Evaluation $OT Eval Low Complexity: 1 Low  Kathie Dike, M.S. OTR/L  06/08/22, 10:17 AM  ascom [phone]
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
## 2022-06-09 ENCOUNTER — Telehealth: Payer: Self-pay

## 2022-06-09 LAB — HIV ANTIBODY (ROUTINE TESTING W REFLEX): HIV Screen 4th Generation wRfx: NONREACTIVE

## 2022-06-09 NOTE — Transitions of Care (Post Inpatient/ED Visit) (Signed)
   06/09/2022  Name: Sydney James MRN: [id number] DOB: 1995-08-29  Today's TOC FU Call Status: Today's TOC FU Call Status:: Successful TOC FU Call Competed TOC FU Call Complete Date: 06/09/22  Transition Care Management Follow-up Telephone Call Date of Discharge: 06/08/22 Discharge Facility: [facility]) Type of Discharge: Inpatient Admission Primary Inpatient Discharge Diagnosis:: left knee surgery How have you been since you were released from the hospital?: Better Any questions or concerns?: No  Items Reviewed: Did you receive and understand the discharge instructions provided?: Yes Medications obtained,verified, and reconciled?: Yes (Medications Reviewed) Any new allergies since your discharge?: No Dietary orders reviewed?: Yes Do you have support at home?: Yes People in Home: parent(s)  Medications Reviewed Today: Medications Reviewed Today     Reviewed by Karena Addison, LPN (Licensed Practical Nurse) on 06/09/22 at 1022  Med List Status: <None>   Medication Order Taking? Sig Documenting Provider Last Dose Status Informant  aspirin EC 325 MG tablet [id number] Yes Take 1 tablet (325 mg total) by mouth daily. Dedra Skeens, PA-C Taking Active   methocarbamol (ROBAXIN) 500 MG tablet [id number] Yes Take 1 tablet (500 mg total) by mouth every 6 (six) hours as needed for muscle spasms. Dedra Skeens, PA-C Taking Active   ondansetron [facility]) 4 MG tablet [id number] Yes Take 1 tablet (4 mg total) by mouth every 6 (six) hours as needed for nausea. Dedra Skeens, PA-C Taking Active   oxyCODONE (OXY IR/ROXICODONE) 5 MG immediate release tablet [id number] Yes Take 1-2 tablets (5-10 mg total) by mouth every 4 (four) hours as needed for moderate pain (pain score 4-6). Dedra Skeens, PA-C Taking Active   sertraline (ZOLOFT) 50 MG tablet [id number] Yes Take 1 tablet (50 mg total) by mouth daily. Dale Newark, MD Taking Active   tirzepatide [facility]) 2.5 MG/0.5ML Pen  [id number] Yes Inject 2.5 mg into the skin once a week. Dale [place], MD Taking Active Self            Home Care and Equipment/Supplies: Were Home Health Services Ordered?: NA Any new equipment or medical supplies ordered?: NA  Functional Questionnaire: Do you need assistance with bathing/showering or dressing?: Yes Do you need assistance with meal preparation?: No Do you need assistance with eating?: No Do you have difficulty maintaining continence: No Do you need assistance with getting out of bed/getting out of a chair/moving?: No Do you have difficulty managing or taking your medications?: No  Follow up appointments reviewed: PCP Follow-up appointment confirmed?: NA Specialist Hospital Follow-up appointment confirmed?: Yes Date of Specialist follow-up appointment?: 06/29/22 Follow-Up Specialty Provider:: Dr Allena Katz ortho surg Do you need transportation to your follow-up appointment?: No Do you understand care options if your condition(s) worsen?: Yes-patient verbalized understanding    SIGNATURE Karena Addison, LPN [facility] Nurse Health Advisor Direct Dial [phone]

## 2022-07-26 ENCOUNTER — Encounter: Payer: Self-pay | Admitting: Certified Nurse Midwife

## 2022-08-01 ENCOUNTER — Ambulatory Visit: Admitting: Internal Medicine

## 2022-08-01 ENCOUNTER — Encounter: Payer: Self-pay | Admitting: Internal Medicine

## 2022-08-01 VITALS — BP 108/70 | HR 90 | Temp 97.9°F | Resp 16 | Ht 67.0 in | Wt 219.6 lb

## 2022-08-01 DIAGNOSIS — M25562 Pain in left knee: Secondary | ICD-10-CM

## 2022-08-01 DIAGNOSIS — Z0000 Encounter for general adult medical examination without abnormal findings: Secondary | ICD-10-CM | POA: Diagnosis not present

## 2022-08-01 DIAGNOSIS — N921 Excessive and frequent menstruation with irregular cycle: Secondary | ICD-10-CM | POA: Diagnosis not present

## 2022-08-01 DIAGNOSIS — Z13220 Encounter for screening for lipoid disorders: Secondary | ICD-10-CM | POA: Diagnosis not present

## 2022-08-01 DIAGNOSIS — I10 Essential (primary) hypertension: Secondary | ICD-10-CM | POA: Diagnosis not present

## 2022-08-01 DIAGNOSIS — F419 Anxiety disorder, unspecified: Secondary | ICD-10-CM

## 2022-08-01 DIAGNOSIS — M25561 Pain in right knee: Secondary | ICD-10-CM

## 2022-08-01 DIAGNOSIS — N920 Excessive and frequent menstruation with regular cycle: Secondary | ICD-10-CM | POA: Insufficient documentation

## 2022-08-01 LAB — COMPREHENSIVE METABOLIC PANEL
ALT: 18 U/L (ref 0–35)
AST: 18 U/L (ref 0–37)
Albumin: 4.6 g/dL (ref 3.5–5.2)
Alkaline Phosphatase: 68 U/L (ref 39–117)
BUN: 13 mg/dL (ref 6–23)
CO2: 25 mEq/L (ref 19–32)
Calcium: 9.6 mg/dL (ref 8.4–10.5)
Chloride: 100 mEq/L (ref 96–112)
Creatinine, Ser: 0.82 mg/dL (ref 0.40–1.20)
GFR: 97.12 mL/min (ref >60.00)
Glucose, Bld: 101 mg/dL — ABNORMAL HIGH (ref 70–99)
Potassium: 3.7 mEq/L (ref 3.5–5.1)
Sodium: 135 mEq/L (ref 135–145)
Total Bilirubin: 0.7 mg/dL (ref 0.2–1.2)
Total Protein: 7.7 g/dL (ref 6.0–8.3)

## 2022-08-01 LAB — CBC WITH DIFFERENTIAL/PLATELET
Basophils Absolute: 0 10*3/uL (ref 0.0–0.1)
Basophils Relative: 0.5 % (ref 0.0–3.0)
Eosinophils Absolute: 0.2 10*3/uL (ref 0.0–0.7)
Eosinophils Relative: 3 % (ref 0.0–5.0)
HCT: 41.4 % (ref 36.0–46.0)
Hemoglobin: 13.4 g/dL (ref 12.0–15.0)
Lymphocytes Relative: 33.5 % (ref 12.0–46.0)
Lymphs Abs: 2.7 10*3/uL (ref 0.7–4.0)
MCHC: 32.3 g/dL (ref 30.0–36.0)
MCV: 81.8 fl (ref 78.0–100.0)
Monocytes Absolute: 0.5 10*3/uL (ref 0.1–1.0)
Monocytes Relative: 6.8 % (ref 3.0–12.0)
Neutro Abs: 4.5 10*3/uL (ref 1.4–7.7)
Neutrophils Relative %: 56.2 % (ref 43.0–77.0)
Platelets: 310 10*3/uL (ref 150.0–400.0)
RBC: 5.06 Mil/uL (ref 3.87–5.11)
RDW: 14.3 % (ref 11.5–15.5)
WBC: 7.9 10*3/uL (ref 4.0–10.5)

## 2022-08-01 LAB — LIPID PANEL
Cholesterol: 247 mg/dL — ABNORMAL HIGH (ref 0–200)
HDL: 36 mg/dL — ABNORMAL LOW (ref >39.00)
NonHDL: 211.47
Total CHOL/HDL Ratio: 7
Triglycerides: 255 mg/dL — ABNORMAL HIGH (ref 0.0–149.0)
VLDL: 51 mg/dL — ABNORMAL HIGH (ref 0.0–40.0)

## 2022-08-01 LAB — LDL CHOLESTEROL, DIRECT: Direct LDL: 169 mg/dL

## 2022-08-01 LAB — TSH: TSH: 0.97 u[IU]/mL (ref 0.35–5.50)
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
## 2022-08-01 NOTE — Assessment & Plan Note (Signed)
Physical today 08/01/22. PAP 04/2020 - negative.

## 2022-08-07 ENCOUNTER — Encounter: Payer: Self-pay | Admitting: Internal Medicine

## 2022-08-07 NOTE — Assessment & Plan Note (Signed)
Check cbc.  Keep f/u with gyn.

## 2022-08-07 NOTE — Assessment & Plan Note (Signed)
Blood pressure doing well off medication.  Follow pressures.

## 2022-08-07 NOTE — Assessment & Plan Note (Signed)
She feels zoloft is helping.  Continue current dose.  Will notify me if feels needs dose adjustment. Follow

## 2022-08-07 NOTE — Assessment & Plan Note (Signed)
S/p recent knee surgery (06/07/22).  Has been going to PT.  Doing well from her surgery.  Hopefully can get the brace off soon.

## 2022-08-15 ENCOUNTER — Encounter: Payer: Self-pay | Admitting: Internal Medicine

## 2022-08-15 ENCOUNTER — Other Ambulatory Visit (INDEPENDENT_AMBULATORY_CARE_PROVIDER_SITE_OTHER)

## 2022-08-15 ENCOUNTER — Telehealth: Admitting: Internal Medicine

## 2022-08-15 ENCOUNTER — Other Ambulatory Visit: Payer: Self-pay

## 2022-08-15 VITALS — BP 133/79 | Ht 67.0 in | Wt 214.0 lb

## 2022-08-15 DIAGNOSIS — F419 Anxiety disorder, unspecified: Secondary | ICD-10-CM | POA: Diagnosis not present

## 2022-08-15 DIAGNOSIS — R300 Dysuria: Secondary | ICD-10-CM

## 2022-08-15 DIAGNOSIS — I10 Essential (primary) hypertension: Secondary | ICD-10-CM | POA: Diagnosis not present

## 2022-08-15 DIAGNOSIS — N3000 Acute cystitis without hematuria: Secondary | ICD-10-CM | POA: Diagnosis not present

## 2022-08-15 LAB — URINALYSIS, ROUTINE W REFLEX MICROSCOPIC
Bilirubin Urine: NEGATIVE
Ketones, ur: NEGATIVE
Nitrite: NEGATIVE
Specific Gravity, Urine: 1.02 (ref 1.000–1.030)
Total Protein, Urine: 100 — AB
Urine Glucose: NEGATIVE
Urobilinogen, UA: 0.2 (ref 0.0–1.0)
pH: 6 (ref 5.0–8.0)

## 2022-08-15 MED ORDER — CEFDINIR 300 MG PO CAPS: 300.0000 mg | ORAL_CAPSULE | Freq: Two times a day (BID) | ORAL | 0 refills | Status: DC

## 2022-08-15 NOTE — Telephone Encounter (Signed)
Patient scheduled.
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
## 2022-08-17 ENCOUNTER — Other Ambulatory Visit: Payer: Self-pay

## 2022-08-17 DIAGNOSIS — R319 Hematuria, unspecified: Secondary | ICD-10-CM

## 2022-08-20 ENCOUNTER — Encounter: Payer: Self-pay | Admitting: Internal Medicine

## 2022-08-20 DIAGNOSIS — N390 Urinary tract infection, site not specified: Secondary | ICD-10-CM | POA: Insufficient documentation

## 2022-08-20 NOTE — Assessment & Plan Note (Signed)
Continue zoloft.  Follow.   

## 2022-08-20 NOTE — Assessment & Plan Note (Signed)
Blood pressure has been doing well off medication.  Follow pressures.

## 2022-08-20 NOTE — Assessment & Plan Note (Signed)
Symptoms as outlined.  Urinalysis - moderate leukocytes and TNTC wbc's.  Treat with omnicef.  Culture pending.  Stay hydrated.  Follow.

## 2022-08-22 ENCOUNTER — Ambulatory Visit (INDEPENDENT_AMBULATORY_CARE_PROVIDER_SITE_OTHER): Admitting: Certified Nurse Midwife

## 2022-08-22 ENCOUNTER — Encounter: Payer: Self-pay | Admitting: Certified Nurse Midwife

## 2022-08-22 VITALS — BP 134/90 | HR 86 | Wt 217.8 lb

## 2022-08-22 DIAGNOSIS — N946 Dysmenorrhea, unspecified: Secondary | ICD-10-CM | POA: Insufficient documentation

## 2022-08-22 DIAGNOSIS — N920 Excessive and frequent menstruation with regular cycle: Secondary | ICD-10-CM
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
## 2022-09-19 ENCOUNTER — Encounter: Payer: Self-pay | Admitting: Certified Nurse Midwife

## 2022-09-19 ENCOUNTER — Encounter: Payer: Self-pay | Admitting: Internal Medicine

## 2022-09-19 ENCOUNTER — Other Ambulatory Visit: Payer: Self-pay | Admitting: Certified Nurse Midwife

## 2022-09-19 MED ORDER — SLYND 4 MG PO TABS: 1.0000 | ORAL_TABLET | Freq: Every day | ORAL | 3 refills | Status: DC

## 2022-09-19 NOTE — Telephone Encounter (Signed)
Confirm doing ok.  No acute issues.  Given elevated blood pressure, see if agreeable for f/u appt to discuss treatment.

## 2022-09-19 NOTE — Telephone Encounter (Signed)
Lvm for pt to give office a call back. Pt is schedule to see [facility] tomorrow for not being able to keep food down.

## 2022-09-19 NOTE — Telephone Encounter (Signed)
Please call and clarify symptoms.  Sydney James's note did not mention (that I see) - anything about keeping food down. Please clarify symptoms.

## 2022-09-19 NOTE — Telephone Encounter (Signed)
Mychart message sent to pt to upload readings and asked questions in regards to blood pressure

## 2022-09-19 NOTE — Telephone Encounter (Signed)
Pt sent in readings of blood pressure

## 2022-09-20 ENCOUNTER — Ambulatory Visit: Admitting: Nurse Practitioner

## 2022-09-20 NOTE — Telephone Encounter (Signed)
FYI for you- Called and spoke with patient. She is on your schedule Friday to discuss her BP. She decided to cancel the one for today. For the last 2 weeks, she has been having issues keeping foods down. There is no pattern- no certain time of day. Is not triggered by specific foods. She is not vomiting all the time but she is nauseous every time she eats. When she is able to keep food down within an hour she has to have a bowel movement which is abnormal for her. She is able to keep fluids down. Not pregnant. She has contacted her OB. She denies any other symptoms. No abd pain, fever chills, etc. No new medications. GYN prescribed Slynd yesterday but she cannot start until she starts her cycle. Only medication she is taking daily is zoloft. She did agree to be evaluated more acutely if any new or worsening changes.

## 2022-09-23 ENCOUNTER — Ambulatory Visit: Admitting: Internal Medicine

## 2022-09-23 NOTE — Progress Notes (Deleted)
Subjective:    Patient ID: Sydney James, female    DOB: [birth year], 27 y.o.   MRN: [id number]  Patient here for No chief complaint on file.   HPI Work in appt - work in to discuss her blood pressure and problems with keeping food down. S/p recent knee surgery (06/07/22). Has been going to PT. Doing well from her surgery. On zoloft.    Past Medical History:  Diagnosis Date   GERD (gastroesophageal reflux disease)    Lumbar herniated disc    Migraine    Motion sickness    passenger -  car   Preeclampsia    PVC's (premature ventricular contractions)    during pregnancy   Raynaud's disease    Scoliosis    Back brace for 2 years   Past Surgical History:  Procedure Laterality Date   CESAREAN SECTION N/A 12/22/2017   Procedure: CESAREAN SECTION;  Surgeon: Linzie Collin, MD;  Location: [facility];  Service: Obstetrics;  Laterality: N/A;  Female Born @ 47     DILATION AND CURETTAGE OF UTERUS     DILATION AND EVACUATION N/A 03/15/2016   Procedure: DILATATION AND EVACUATION;  Surgeon: Herold Harms, MD;  Location: [facility];  Service: Gynecology;  Laterality: N/A;   DILATION AND EVACUATION N/A 07/18/2019   Procedure: DILATATION AND EVACUATION;  Surgeon: Hildred Laser, MD;  Location: [facility];  Service: Gynecology;  Laterality: N/A;   KNEE ARTHROSCOPY Left 12/13/2021   Procedure: Left knee arthroscopy and chondroplasty with cartilage biopsy;  Surgeon: Signa Kell, MD;  Location: [facility];  Service: Orthopedics;  Laterality: Left;   WISDOM TOOTH EXTRACTION     Family History  Problem Relation Age of Onset   Hypertension Father    Hypertension Mother    Early death Mother    Cancer Maternal Grandmother    Cancer Maternal Grandfather    Diabetes Maternal Grandfather    Cancer Paternal Grandmother    Diabetes Paternal Grandfather    Social History   Socioeconomic History   Marital status: Married    Spouse name: Lakeisa Towns   Number of children: Not  on file   Years of education: Not on file   Highest education level: Not on file  Occupational History   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  Tobacco Use   Smoking status: Never   Smokeless tobacco: Never  Vaping Use   Vaping status: Never Used  Substance and Sexual Activity   Alcohol use: No   Drug use: No   Sexual activity: Not Currently  Other Topics Concern   Not on file  Social History Narrative   Not on file   Social Determinants of Health   Financial Resource Strain: Low Risk  (12/19/2017)   Overall Financial Resource Strain (CARDIA)    Difficulty of Paying Living Expenses: Not hard at all  Food Insecurity: No Food Insecurity (06/07/2022)   Hunger Vital Sign    Worried About Running Out of Food in the Last Year: Never true    Ran Out of Food in the Last Year: Never true  Transportation Needs: No Transportation Needs (06/07/2022)   PRAPARE - Administrator, Civil Service (Medical): No    Lack of Transportation (Non-Medical): No  Physical Activity: Inactive (12/19/2017)   Exercise Vital Sign    Days of Exercise per Week: 0 days    Minutes of Exercise per Session: 0 min  Stress: No Stress Concern Present (12/19/2017)   [employer] of Occupational Health -  Occupational Stress Questionnaire    Feeling of Stress : Not at all  Social Connections: Moderately Integrated (12/19/2017)   Social Connection and Isolation Panel [NHANES]    Frequency of Communication with Friends and Family: More than three times a week    Frequency of Social Gatherings with Friends and Family: More than three times a week    Attends Religious Services: Never    Database administrator or Organizations: Yes    Attends Engineer, structural: More than 4 times per year    Marital Status: Married     Review of Systems     Objective:     There were no vitals taken for this visit. Wt Readings from Last 3 Encounters:  08/22/22 217 lb 12.8 oz (98.8 kg)  08/15/22 214 lb (97.1 kg)   08/01/22 219 lb 9.6 oz (99.6 kg)    Physical Exam   Outpatient Encounter Medications as of 09/23/2022  Medication Sig   Drospirenone (SLYND) 4 MG TABS Take 1 tablet (4 mg total) by mouth daily at 6 (six) AM.   cefdinir (OMNICEF) 300 MG capsule Take 1 capsule (300 mg total) by mouth 2 (two) times daily.   sertraline (ZOLOFT) 50 MG tablet Take 1 tablet (50 mg total) by mouth daily.   tirzepatide (ZEPBOUND) 2.5 MG/0.5ML Pen Inject 2.5 mg into the skin once a week. (Patient not taking: Reported on 08/15/2022)   No facility-administered encounter medications on file as of 09/23/2022.     Lab Results  Component Value Date   WBC 7.9 08/01/2022   HGB 13.4 08/01/2022   HCT 41.4 08/01/2022   PLT 310.0 08/01/2022   GLUCOSE 101 (H) 08/01/2022   CHOL 247 (H) 08/01/2022   TRIG 255.0 (H) 08/01/2022   HDL 36.00 (L) 08/01/2022   LDLDIRECT 169.0 08/01/2022   LDLCALC 128 (H) 11/22/2019   ALT 18 08/01/2022   AST 18 08/01/2022   NA 135 08/01/2022   K 3.7 08/01/2022   CL 100 08/01/2022   CREATININE 0.82 08/01/2022   BUN 13 08/01/2022   CO2 25 08/01/2022   TSH 0.97 08/01/2022   INR 1.1 07/13/2021   GLUF 92 11/23/2017   HGBA1C 5.5 05/14/2021    DG Tibia/Fibula Left  Result Date: 06/07/2022 CLINICAL DATA:  [id number] Surgery, elective [id number] EXAM: LEFT TIBIA AND FIBULA - 2 VIEW COMPARISON:  Preoperative exam FINDINGS: Five fluoroscopic spot views of the left proximal tibia and fibula obtained in the operating room. Proximal tibial osteotomy with 3 screws traversing the proximal tibia. Fluoroscopy time 25.6 seconds. Dose 1.65 mGy. IMPRESSION: Intraoperative fluoroscopy for left lower extremity surgery. Electronically Signed   By: Narda Rutherford M.D.   On: 06/07/2022 11:43   DG C-Arm 1-60 Min-No Report  Result Date: 06/07/2022 Fluoroscopy was utilized by the requesting physician.  No radiographic interpretation.   [place] OR NERVE BLOCK-IMAGE ONLY [facility])  Result Date: 06/07/2022 There is no  interpretation for this exam.  This order is for images obtained during a surgical procedure.  Please See "Surgeries" Tab for more information regarding the procedure.       Assessment & Plan:  There are no diagnoses linked to this encounter.   Dale [place], MD

## 2022-09-27 ENCOUNTER — Encounter: Payer: Self-pay | Admitting: Internal Medicine

## 2022-09-27 ENCOUNTER — Ambulatory Visit (INDEPENDENT_AMBULATORY_CARE_PROVIDER_SITE_OTHER): Admitting: Internal Medicine

## 2022-09-27 VITALS — BP 138/72 | HR 78 | Temp 98.2°F | Ht 67.0 in | Wt 207.4 lb

## 2022-09-27 DIAGNOSIS — R630 Anorexia: Secondary | ICD-10-CM | POA: Diagnosis not present

## 2022-09-27 DIAGNOSIS — I10 Essential (primary) hypertension: Secondary | ICD-10-CM | POA: Diagnosis not present

## 2022-09-27 DIAGNOSIS — N921 Excessive and frequent menstruation with irregular cycle: Secondary | ICD-10-CM | POA: Diagnosis not present

## 2022-09-27 LAB — CBC WITH DIFFERENTIAL/PLATELET
Basophils Absolute: 0 10*3/uL (ref 0.0–0.1)
Basophils Relative: 0.7 % (ref 0.0–3.0)
Eosinophils Absolute: 0.2 10*3/uL (ref 0.0–0.7)
Eosinophils Relative: 2.6 % (ref 0.0–5.0)
HCT: 42.8 % (ref 36.0–46.0)
Hemoglobin: 13.6 g/dL (ref 12.0–15.0)
Lymphocytes Relative: 36 % (ref 12.0–46.0)
Lymphs Abs: 2.3 10*3/uL (ref 0.7–4.0)
MCHC: 31.8 g/dL (ref 30.0–36.0)
MCV: 80.7 fl (ref 78.0–100.0)
Monocytes Absolute: 0.6 10*3/uL (ref 0.1–1.0)
Monocytes Relative: 8.9 % (ref 3.0–12.0)
Neutro Abs: 3.3 10*3/uL (ref 1.4–7.7)
Neutrophils Relative %: 51.8 % (ref 43.0–77.0)
Platelets: 301 10*3/uL (ref 150.0–400.0)
RBC: 5.3 Mil/uL — ABNORMAL HIGH (ref 3.87–5.11)
RDW: 15.2 % (ref 11.5–15.5)
WBC: 6.4 10*3/uL (ref 4.0–10.5)

## 2022-09-27 LAB — BASIC METABOLIC PANEL
BUN: 11 mg/dL (ref 6–23)
CO2: 27 mEq/L (ref 19–32)
Calcium: 9.5 mg/dL (ref 8.4–10.5)
Chloride: 103 mEq/L (ref 96–112)
Creatinine, Ser: 0.83 mg/dL (ref 0.40–1.20)
GFR: 95.61 mL/min (ref >60.00)
Glucose, Bld: 92 mg/dL (ref 70–99)
Potassium: 3.3 mEq/L — ABNORMAL LOW (ref 3.5–5.1)
Sodium: 139 mEq/L (ref 135–145)

## 2022-09-27 LAB — HEPATIC FUNCTION PANEL
ALT: 14 U/L (ref 0–35)
AST: 13 U/L (ref 0–37)
Albumin: 4.4 g/dL (ref 3.5–5.2)
Alkaline Phosphatase: 65 U/L (ref 39–117)
Bilirubin, Direct: 0.1 mg/dL (ref 0.0–0.3)
Total Bilirubin: 0.7 mg/dL (ref 0.2–1.2)
Total Protein: 7.3 g/dL (ref 6.0–8.3)

## 2022-09-27 LAB — HEMOGLOBIN A1C: Hgb A1c MFr Bld: 5.8 % (ref 4.6–6.5)

## 2022-09-27 NOTE — Progress Notes (Signed)
Subjective:    Patient ID: Sydney James, female    DOB: 10-10-1995, 27 y.o.   MRN: [id number]  Patient here for  Chief Complaint  Patient presents with   Medication Management    HPI Work in appt.  Work in to discuss blood pressure.  Also having issues with not being able to keep food down. Reports that symptoms started one month ago. Describes "not feeling well" and reports nausea.  Reports intermittent diarrhea.  Decreased appetite.  Some intermittent vomiting.  Hard to keep food down.  When she does eat, will then have the loose stool (soon after eating).  She denies being pregnant.  Has had period.  No increased abdominal pain.  No fever. Seeing GYN.  Prescribed Slynd.  Blood pressure elevated.  Does report some ear soreness (left ear).  No increased sinus congestion.     Past Medical History:  Diagnosis Date   GERD (gastroesophageal reflux disease)    Lumbar herniated disc    Migraine    Motion sickness    passenger -  car   Preeclampsia    PVC's (premature ventricular contractions)    during pregnancy   Raynaud's disease    Scoliosis    Back brace for 2 years   Past Surgical History:  Procedure Laterality Date   CESAREAN SECTION N/A 12/22/2017   Procedure: CESAREAN SECTION;  Surgeon: Linzie Collin, MD;  Location: [facility];  Service: Obstetrics;  Laterality: N/A;  Female Born @ 34     DILATION AND CURETTAGE OF UTERUS     DILATION AND EVACUATION N/A 03/15/2016   Procedure: DILATATION AND EVACUATION;  Surgeon: Herold Harms, MD;  Location: [facility];  Service: Gynecology;  Laterality: N/A;   DILATION AND EVACUATION N/A 07/18/2019   Procedure: DILATATION AND EVACUATION;  Surgeon: Hildred Laser, MD;  Location: [facility];  Service: Gynecology;  Laterality: N/A;   KNEE ARTHROSCOPY Left 12/13/2021   Procedure: Left knee arthroscopy and chondroplasty with cartilage biopsy;  Surgeon: Signa Kell, MD;  Location: [facility];  Service: Orthopedics;   Laterality: Left;   WISDOM TOOTH EXTRACTION     Family History  Problem Relation Age of Onset   Hypertension Father    Hypertension Mother    Early death Mother    Cancer Maternal Grandmother    Cancer Maternal Grandfather    Diabetes Maternal Grandfather    Cancer Paternal Grandmother    Diabetes Paternal Grandfather    Social History   Socioeconomic History   Marital status: Married    Spouse name: Loredana Thompsen   Number of children: Not on file   Years of education: Not on file   Highest education level: Not on file  Occupational History   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  Tobacco Use   Smoking status: Never   Smokeless tobacco: Never  Vaping Use   Vaping status: Never Used  Substance and Sexual Activity   Alcohol use: No   Drug use: No   Sexual activity: Not Currently  Other Topics Concern   Not on file  Social History Narrative   Not on file   Social Determinants of Health   Financial Resource Strain: Low Risk  (12/19/2017)   Overall Financial Resource Strain (CARDIA)    Difficulty of Paying Living Expenses: Not hard at all  Food Insecurity: No Food Insecurity (06/07/2022)   Hunger Vital Sign    Worried About Running Out of Food in the Last Year: Never true    Ran Out of  Food in the Last Year: Never true  Transportation Needs: No Transportation Needs (06/07/2022)   PRAPARE - Administrator, Civil Service (Medical): No    Lack of Transportation (Non-Medical): No  Physical Activity: Inactive (12/19/2017)   Exercise Vital Sign    Days of Exercise per Week: 0 days    Minutes of Exercise per Session: 0 min  Stress: No Stress Concern Present (12/19/2017)   [employer] of Occupational Health - Occupational Stress Questionnaire    Feeling of Stress : Not at all  Social Connections: Moderately Integrated (12/19/2017)   Social Connection and Isolation Panel [NHANES]    Frequency of Communication with Friends and Family: More than three times a week     Frequency of Social Gatherings with Friends and Family: More than three times a week    Attends Religious Services: Never    Database administrator or Organizations: Yes    Attends Engineer, structural: More than 4 times per year    Marital Status: Married     Review of Systems  Constitutional:  Negative for fever.       Decreased appetite.  Weight down from last check.   HENT:  Negative for congestion and sinus pressure.        Left ear pain as outlined.   Respiratory:  Negative for cough, chest tightness and shortness of breath.   Cardiovascular:  Negative for chest pain and palpitations.  Gastrointestinal:  Positive for nausea and vomiting.       No increased abdominal pain. Loose stool as outlined.   Genitourinary:  Negative for difficulty urinating and dysuria.  Musculoskeletal:  Negative for joint swelling and myalgias.  Skin:  Negative for color change and rash.  Neurological:  Negative for dizziness and headaches.  Psychiatric/Behavioral:  Negative for agitation and dysphoric mood.        Objective:     BP 138/72   Pulse 78   Temp 98.2 F (36.8 C) (Oral)   Ht 5\' 7"  (1.702 m)   Wt 207 lb 6.4 oz (94.1 kg)   SpO2 100%   BMI 32.48 kg/m  Wt Readings from Last 3 Encounters:  09/27/22 207 lb 6.4 oz (94.1 kg)  08/22/22 217 lb 12.8 oz (98.8 kg)  08/15/22 214 lb (97.1 kg)    Physical Exam Vitals reviewed.  Constitutional:      General: She is not in acute distress.    Appearance: Normal appearance.  HENT:     Head: Normocephalic and atraumatic.     Right Ear: Tympanic membrane, ear canal and external ear normal.     Left Ear: Tympanic membrane, ear canal and external ear normal.  Eyes:     General: No scleral icterus.       Right eye: No discharge.        Left eye: No discharge.     Conjunctiva/sclera: Conjunctivae normal.  Neck:     Thyroid: No thyromegaly.  Cardiovascular:     Rate and Rhythm: Normal rate and regular rhythm.  Pulmonary:      Effort: No respiratory distress.     Breath sounds: Normal breath sounds. No wheezing.  Abdominal:     General: Bowel sounds are normal.     Palpations: Abdomen is soft.     Tenderness: There is no abdominal tenderness.  Musculoskeletal:        General: No swelling or tenderness.     Cervical back: Neck supple. No tenderness.  Lymphadenopathy:  Cervical: No cervical adenopathy.  Skin:    Findings: No erythema or rash.  Neurological:     Mental Status: She is alert.  Psychiatric:        Mood and Affect: Mood normal.        Behavior: Behavior normal.      Outpatient Encounter Medications as of 09/27/2022  Medication Sig   Drospirenone (SLYND) 4 MG TABS Take 1 tablet (4 mg total) by mouth daily at 6 (six) AM.   sertraline (ZOLOFT) 50 MG tablet Take 1 tablet (50 mg total) by mouth daily.   [DISCONTINUED] cefdinir (OMNICEF) 300 MG capsule Take 1 capsule (300 mg total) by mouth 2 (two) times daily.   [DISCONTINUED] tirzepatide (ZEPBOUND) 2.5 MG/0.5ML Pen Inject 2.5 mg into the skin once a week. (Patient not taking: Reported on 08/15/2022)   No facility-administered encounter medications on file as of 09/27/2022.     Lab Results  Component Value Date   WBC 6.4 09/27/2022   HGB 13.6 09/27/2022   HCT 42.8 09/27/2022   PLT 301.0 09/27/2022   GLUCOSE 92 09/27/2022   CHOL 247 (H) 08/01/2022   TRIG 255.0 (H) 08/01/2022   HDL 36.00 (L) 08/01/2022   LDLDIRECT 169.0 08/01/2022   LDLCALC 128 (H) 11/22/2019   ALT 14 09/27/2022   AST 13 09/27/2022   NA 139 09/27/2022   K 3.3 (L) 09/27/2022   CL 103 09/27/2022   CREATININE 0.83 09/27/2022   BUN 11 09/27/2022   CO2 27 09/27/2022   TSH 0.97 08/01/2022   INR 1.1 07/13/2021   GLUF 92 11/23/2017   HGBA1C 5.8 09/27/2022    DG Tibia/Fibula Left  Result Date: 06/07/2022 CLINICAL DATA:  [id number] Surgery, elective [id number] EXAM: LEFT TIBIA AND FIBULA - 2 VIEW COMPARISON:  Preoperative exam FINDINGS: Five fluoroscopic spot views of the left  proximal tibia and fibula obtained in the operating room. Proximal tibial osteotomy with 3 screws traversing the proximal tibia. Fluoroscopy time 25.6 seconds. Dose 1.65 mGy. IMPRESSION: Intraoperative fluoroscopy for left lower extremity surgery. Electronically Signed   By: Narda Rutherford M.D.   On: 06/07/2022 11:43   DG C-Arm 1-60 Min-No Report  Result Date: 06/07/2022 Fluoroscopy was utilized by the requesting physician.  No radiographic interpretation.   [place] OR NERVE BLOCK-IMAGE ONLY [facility])  Result Date: 06/07/2022 There is no interpretation for this exam.  This order is for images obtained during a surgical procedure.  Please See "Surgeries" Tab for more information regarding the procedure.       Assessment & Plan:  Essential hypertension Assessment & Plan: Previously on medication.  Blood pressure had been doing well.  Recent elevation.  Discussed treatment.  Will hold on restarting medication.  Evaluate current symptoms and see if contributing to elevation.  Continue to follow pressures.  If persistent elevation, will start low dose medication.  Check metabolic panel.    Decreased appetite Assessment & Plan: Symptoms as outlined. Decreased appetite, nausea and intermittent vomiting. Loose stool - after eating.  No specific food.  Occurs with eating (any food).  No urine change.  Will check routine labs, including cbc, metabolic panel and liver function tests.  Also, check serum pregnancy test.  Consider further scanning.  Trial of pepcid before evening meal.   Orders: -     CBC with Differential/Platelet -     Basic metabolic panel -     Hepatic function panel -     Hemoglobin A1c -     Beta hCG quant (  ref lab)  Menorrhagia with irregular cycle Assessment & Plan: Saw gyn.  Recommended progesterone only birth control.        Dale Camp Crook, MD

## 2022-09-28 LAB — BETA HCG QUANT (REF LAB): hCG Quant: <1 m[IU]/mL

## 2022-09-30 ENCOUNTER — Encounter: Payer: Self-pay | Admitting: Internal Medicine

## 2022-09-30 ENCOUNTER — Encounter: Payer: Self-pay | Admitting: *Deleted

## 2022-09-30 ENCOUNTER — Other Ambulatory Visit: Payer: Self-pay | Admitting: *Deleted

## 2022-09-30 ENCOUNTER — Other Ambulatory Visit: Payer: Self-pay | Admitting: Internal Medicine

## 2022-09-30 DIAGNOSIS — R112 Nausea with vomiting, unspecified: Secondary | ICD-10-CM

## 2022-09-30 DIAGNOSIS — E876 Hypokalemia: Secondary | ICD-10-CM

## 2022-09-30 DIAGNOSIS — R197 Diarrhea, unspecified: Secondary | ICD-10-CM

## 2022-09-30 NOTE — Assessment & Plan Note (Signed)
Previously on medication.  Blood pressure had been doing well.  Recent elevation.  Discussed treatment.  Will hold on restarting medication.  Evaluate current symptoms and see if contributing to elevation.  Continue to follow pressures.  If persistent elevation, will start low dose medication.  Check metabolic panel.

## 2022-09-30 NOTE — Assessment & Plan Note (Signed)
Saw gyn.  Recommended progesterone only birth control.

## 2022-09-30 NOTE — Progress Notes (Signed)
Order placed for abdominal ultrasound.   

## 2022-09-30 NOTE — Assessment & Plan Note (Addendum)
Symptoms as outlined. Decreased appetite, nausea and intermittent vomiting. Loose stool - after eating.  No specific food.  Occurs with eating (any food).  No urine change.  Will check routine labs, including cbc, metabolic panel and liver function tests.  Also, check serum pregnancy test.  Consider further scanning.  Trial of pepcid before evening meal.

## 2022-10-05 ENCOUNTER — Ambulatory Visit

## 2022-10-18 ENCOUNTER — Encounter: Payer: Self-pay | Admitting: Certified Nurse Midwife

## 2022-10-19 ENCOUNTER — Other Ambulatory Visit

## 2022-10-25 NOTE — Progress Notes (Deleted)
GYNECOLOGY PROGRESS NOTE  Subjective:    Patient ID: Sydney James, female    DOB: May 11, 1995, 27 y.o.   MRN: [id number]  HPI  Patient is a 27 y.o. [id number] female who presents for bilateral tubal ligation consultation.  {Common ambulatory SmartLinks:19316}  Review of Systems {ros; complete:30496}   Objective:   There were no vitals taken for this visit. There is no height or weight on file to calculate BMI. General appearance: {general exam:16600} Abdomen: {abdominal exam:16834} Pelvic: {pelvic exam:16852::"cervix normal in appearance","external genitalia normal","no adnexal masses or tenderness","no cervical motion tenderness","rectovaginal septum normal","uterus normal size, shape, and consistency","vagina normal without discharge"} Extremities: {extremity exam:5109} Neurologic: {neuro exam:17854}   Assessment:   No diagnosis found.   Plan:   There are no diagnoses linked to this encounter.    Hildred Laser, MD [facility]

## 2022-10-28 ENCOUNTER — Ambulatory Visit: Admitting: Obstetrics and Gynecology

## 2022-11-03 ENCOUNTER — Ambulatory Visit: Admitting: Obstetrics

## 2022-11-03 ENCOUNTER — Encounter: Payer: Self-pay | Admitting: Obstetrics and Gynecology

## 2022-11-03 ENCOUNTER — Ambulatory Visit (INDEPENDENT_AMBULATORY_CARE_PROVIDER_SITE_OTHER): Admitting: Obstetrics and Gynecology

## 2022-11-03 VITALS — BP 151/103 | HR 65 | Ht 67.0 in | Wt 214.2 lb

## 2022-11-03 DIAGNOSIS — Z3009 Encounter for other general counseling and advice on contraception: Secondary | ICD-10-CM | POA: Diagnosis not present

## 2022-11-03 NOTE — Progress Notes (Signed)
HPI:      [name] is a 26 y.o. [phone] who LMP was Patient's last menstrual period was 11/02/2022.  Subjective:   She presents today to discuss permanent sterilization by tubal ligation.  She has completed childbearing.  Of significant note she has been previously diagnosed with adenomyosis and has heavy bleeding and cramping with her menses.  She has tried progesterone [facility]) and has had previous issues with very bad acne on this medication.    Hx: The following portions of the patient's history were reviewed and updated as appropriate:             She  has a past medical history of GERD (gastroesophageal reflux disease), Lumbar herniated disc, Migraine, Motion sickness, Preeclampsia, PVC's (premature ventricular contractions), Raynaud's disease, and Scoliosis. She does not have any pertinent problems on file. She  has a past surgical history that includes Dilation and evacuation (N/A, 03/15/2016); Dilation and curettage of uterus; Wisdom tooth extraction; Cesarean section (N/A, 12/22/2017); Dilation and evacuation (N/A, 07/18/2019); and Knee arthroscopy (Left, 12/13/2021). Her family history includes Cancer in her maternal grandfather, maternal grandmother, and paternal grandmother; Diabetes in her maternal grandfather and paternal grandfather; Early death in her mother; Hypertension in her father and mother. She  reports that she has never smoked. She has never used smokeless tobacco. She reports that she does not drink alcohol and does not use drugs. She has a current medication list which includes the following prescription(s): sertraline. She is allergic to aloe, lubricants, monistat [miconazole], sulfa antibiotics, and tape.       Review of Systems:  Review of Systems  Constitutional: Denied constitutional symptoms, night sweats, recent illness, fatigue, fever, insomnia and weight loss.  Eyes: Denied eye symptoms, eye pain, photophobia, vision change and visual disturbance.   Ears/Nose/Throat/Neck: Denied ear, nose, throat or neck symptoms, hearing loss, nasal discharge, sinus congestion and sore throat.  Cardiovascular: Denied cardiovascular symptoms, arrhythmia, chest pain/pressure, edema, exercise intolerance, orthopnea and palpitations.  Respiratory: Denied pulmonary symptoms, asthma, pleuritic pain, productive sputum, cough, dyspnea and wheezing.  Gastrointestinal: Denied, gastro-esophageal reflux, melena, nausea and vomiting.  Genitourinary: Denied genitourinary symptoms including symptomatic vaginal discharge, pelvic relaxation issues, and urinary complaints.  Musculoskeletal: Denied musculoskeletal symptoms, stiffness, swelling, muscle weakness and myalgia.  Dermatologic: Denied dermatology symptoms, rash and scar.  Neurologic: Denied neurology symptoms, dizziness, headache, neck pain and syncope.  Psychiatric: Denied psychiatric symptoms, anxiety and depression.  Endocrine: Denied endocrine symptoms including hot flashes and night sweats.   Meds:   Current Outpatient Medications on File Prior to Visit  Medication Sig Dispense Refill   sertraline (ZOLOFT) 50 MG tablet Take 1 tablet (50 mg total) by mouth daily. 90 tablet 2   No current facility-administered medications on file prior to visit.      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 Encounter for consultation for female sterilization     Discussed multiple forms of birth control but concentrated on Mirena IUD versus permanent sterilization.  Risk benefits of each discussed in detail.   Plan:            1.  She will decide in the near future IUD versus permanent sterilization.  All of her questions were answered.  Follow-up will be with her menses for IUD or for preop if she desires tubal. Orders No orders of the defined types were placed in this encounter.   No orders of the defined types were placed in this encounter.     F/U  No follow-ups on file. I spent 23 minutes involved in the care of this patient preparing to see the patient by obtaining and reviewing her medical history (including labs, imaging tests and prior procedures), documenting clinical information in the electronic health record (EHR), counseling and coordinating care plans, writing and sending prescriptions, ordering tests or procedures and in direct communicating with the patient and medical staff discussing pertinent items from her history and physical exam.  Elonda Husky, M.D. 11/03/2022 2:17 PM

## 2022-11-03 NOTE — Progress Notes (Signed)
Patient presents today to discuss a BTL. She states she is done with childbearing and is uninterested in birth control at this time.

## 2022-11-08 ENCOUNTER — Ambulatory Visit: Admitting: Obstetrics and Gynecology

## 2022-11-09 ENCOUNTER — Ambulatory Visit: Admitting: Internal Medicine
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
## 2022-11-25 ENCOUNTER — Other Ambulatory Visit (HOSPITAL_COMMUNITY)
Admission: RE | Admit: 2022-11-25 | Discharge: 2022-11-25 | Disposition: A | Discharge status: Home / Self Care | Source: Ambulatory Visit | Attending: Obstetrics and Gynecology | Admitting: Obstetrics and Gynecology

## 2022-11-25 ENCOUNTER — Ambulatory Visit (INDEPENDENT_AMBULATORY_CARE_PROVIDER_SITE_OTHER): Admitting: Obstetrics and Gynecology

## 2022-11-25 ENCOUNTER — Encounter: Payer: Self-pay | Admitting: Obstetrics and Gynecology

## 2022-11-25 VITALS — BP 152/104 | HR 65 | Ht 67.0 in | Wt 217.5 lb

## 2022-11-25 DIAGNOSIS — N898 Other specified noninflammatory disorders of vagina: Secondary | ICD-10-CM

## 2022-11-25 DIAGNOSIS — Z01818 Encounter for other preprocedural examination: Secondary | ICD-10-CM

## 2022-11-25 DIAGNOSIS — N9489 Other specified conditions associated with female genital organs and menstrual cycle: Secondary | ICD-10-CM | POA: Diagnosis present

## 2022-11-25 DIAGNOSIS — Z3009 Encounter for other general counseling and advice on contraception: Secondary | ICD-10-CM
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
## 2022-11-25 NOTE — Progress Notes (Signed)
Patient presents today for a pre-op exam prior to BTL. Concerns of yeast infection, she reports itching and burning, NuSwab collected. She states no additional concerns today.
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
## 2022-11-28 LAB — CERVICOVAGINAL ANCILLARY ONLY
Bacterial Vaginitis (gardnerella): NEGATIVE
Candida Glabrata: NEGATIVE
Candida Vaginitis: POSITIVE — AB
Comment: NEGATIVE
Comment: NEGATIVE
Comment: NEGATIVE

## 2022-11-29 ENCOUNTER — Telehealth: Payer: Self-pay

## 2022-11-29 DIAGNOSIS — B3731 Acute candidiasis of vulva and vagina: Secondary | ICD-10-CM

## 2022-11-29 MED ORDER — FLUCONAZOLE 150 MG PO TABS: 150.0000 mg | ORAL_TABLET | Freq: Once | ORAL | 1 refills | Status: AC

## 2022-11-29 NOTE — Telephone Encounter (Signed)
Patient advised she has seen her 11/25/22 swab results +Yeast in my chart. She advised she is allergic to Monistat and is requesting Diflucan. Advised will send in rx.

## 2022-12-16 ENCOUNTER — Encounter
Admission: RE | Admit: 2022-12-16 | Discharge: 2022-12-16 | Disposition: A | Discharge status: Home / Self Care | Source: Ambulatory Visit | Attending: Obstetrics and Gynecology | Admitting: Obstetrics and Gynecology

## 2022-12-16 ENCOUNTER — Other Ambulatory Visit: Payer: Self-pay

## 2022-12-16 HISTORY — DX: Other internal derangements of left knee: M23.8X2

## 2022-12-16 NOTE — Patient Instructions (Addendum)
Your procedure is scheduled on: Friday, December 20 Report to the Registration Desk on the 1st floor of the [facility] To find out your arrival time, please call [phone] between 1PM - 3PM on: Thursday, December 19 If your arrival time is 6:00 am, do not arrive before that time as the [facility] entrance doors do not open until 6:00 am.  REMEMBER: Instructions that are not followed completely may result in serious medical risk, up to and including death; or upon the discretion of your surgeon and anesthesiologist your surgery may need to be rescheduled.  Do not eat or drink after midnight the night before surgery.  No gum chewing or hard candies.  One week prior to surgery: starting December 13 Stop Anti-inflammatories (NSAIDS) such as Advil, Aleve, Ibuprofen, Motrin, Naproxen, Naprosyn and Aspirin based products such as Excedrin, Goody's Powder, BC Powder. Stop ANY OVER THE COUNTER supplements until after surgery. Stop multiple vitamins.  You may however, continue to take Tylenol if needed for pain up until the day of surgery.  Continue taking all of your other prescription medications up until the day of surgery.  ON THE DAY OF SURGERY ONLY TAKE THESE MEDICATIONS WITH SIPS OF WATER:  Sertraline (Zoloft)  No Alcohol for 24 hours before or after surgery.  No Smoking including e-cigarettes for 24 hours before surgery.  No chewable tobacco products for at least 6 hours before surgery.  No nicotine patches on the day of surgery.  Do not use any "recreational" drugs for at least a week (preferably 2 weeks) before your surgery.  Please be advised that the combination of cocaine and anesthesia may have negative outcomes, up to and including death. If you test positive for cocaine, your surgery will be cancelled.  On the morning of surgery brush your teeth with toothpaste and water, you may rinse your mouth with mouthwash if you wish. Do not swallow any toothpaste or  mouthwash.  Use CHG Soap as directed on instruction sheet.  Do not wear jewelry, make-up, hairpins, clips or nail polish.  For welded (permanent) jewelry: bracelets, anklets, waist bands, etc.  Please have this removed prior to surgery.  If it is not removed, there is a chance that hospital personnel will need to cut it off on the day of surgery.  Do not wear lotions, powders, or perfumes.   Do not shave body hair from the neck down 48 hours before surgery.  Contact lenses, hearing aids and dentures may not be worn into surgery.  Do not bring valuables to the hospital. [facility] is not responsible for any missing/lost belongings or valuables.   Notify your doctor if there is any change in your medical condition (cold, fever, infection).  Wear comfortable clothing (specific to your surgery type) to the hospital.  After surgery, you can help prevent lung complications by doing breathing exercises.  Take deep breaths and cough every 1-2 hours.   If you are being discharged the day of surgery, you will not be allowed to drive home. You will need a responsible individual to drive you home and stay with you for 24 hours after surgery.   If you are taking public transportation, you will need to have a responsible individual with you.  Please call the Pre-admissions Testing Dept. at [phone] if you have any questions about these instructions.  Surgery Visitation Policy:  Patients having surgery or a procedure may have two visitors.  Children under the age of 6 must have an adult  with them who is not the patient.      Preparing for Surgery with CHLORHEXIDINE GLUCONATE (CHG) Soap  Chlorhexidine Gluconate (CHG) Soap  o An antiseptic cleaner that kills germs and bonds with the skin to continue killing germs even after washing  o Used for showering the night before surgery and morning of surgery  Before surgery, you can play an important role by reducing the number of germs  on your skin.  CHG (Chlorhexidine gluconate) soap is an antiseptic cleanser which kills germs and bonds with the skin to continue killing germs even after washing.  Please do not use if you have an allergy to CHG or antibacterial soaps. If your skin becomes reddened/irritated stop using the CHG.  1. Shower the NIGHT BEFORE SURGERY and the MORNING OF SURGERY with CHG soap.  2. If you choose to wash your hair, wash your hair first as usual with your normal shampoo.  3. After shampooing, rinse your hair and body thoroughly to remove the shampoo.  4. Use CHG as you would any other liquid soap. You can apply CHG directly to the skin and wash gently with a scrungie or a clean washcloth.  5. Apply the CHG soap to your body only from the neck down. Do not use on open wounds or open sores. Avoid contact with your eyes, ears, mouth, and genitals (private parts). Wash face and genitals (private parts) with your normal soap.  6. Wash thoroughly, paying special attention to the area where your surgery will be performed.  7. Thoroughly rinse your body with warm water.  8. Do not shower/wash with your normal soap after using and rinsing off the CHG soap.  9. Pat yourself dry with a clean towel.  10. Wear clean pajamas to bed the night before surgery.  12. Place clean sheets on your bed the night of your first shower and do not sleep with pets.  13. Shower again with the CHG soap on the day of surgery prior to arriving at the hospital.  14. Do not apply any deodorants/lotions/powders.  15. Please wear clean clothes to the hospital.

## 2022-12-19 ENCOUNTER — Encounter
Admission: RE | Admit: 2022-12-19 | Discharge: 2022-12-19 | Disposition: A | Discharge status: Home / Self Care | Source: Ambulatory Visit | Attending: Obstetrics and Gynecology | Admitting: Obstetrics and Gynecology

## 2022-12-19 DIAGNOSIS — Z01812 Encounter for preprocedural laboratory examination: Secondary | ICD-10-CM | POA: Insufficient documentation

## 2022-12-19 DIAGNOSIS — E876 Hypokalemia: Secondary | ICD-10-CM | POA: Insufficient documentation

## 2022-12-19 DIAGNOSIS — Z01818 Encounter for other preprocedural examination: Secondary | ICD-10-CM

## 2022-12-19 LAB — BASIC METABOLIC PANEL
Anion gap: 11 (ref 5–15)
BUN: 10 mg/dL (ref 6–20)
CO2: 24 mmol/L (ref 22–32)
Calcium: 8.8 mg/dL — ABNORMAL LOW (ref 8.9–10.3)
Chloride: 103 mmol/L (ref 98–111)
Creatinine, Ser: 0.62 mg/dL (ref 0.44–1.00)
GFR, Estimated: >60 mL/min (ref >60)
Glucose, Bld: 104 mg/dL — ABNORMAL HIGH (ref 70–99)
Potassium: 3.6 mmol/L (ref 3.5–5.1)
Sodium: 138 mmol/L (ref 135–145)

## 2022-12-19 LAB — TYPE AND SCREEN
ABO/RH(D): O POS
Antibody Screen: NEGATIVE

## 2022-12-22 MED ORDER — POVIDONE-IODINE 10 % EX SWAB
2.0000 | Freq: Once | CUTANEOUS | Status: AC
  Administered 2022-12-23: 2 via TOPICAL

## 2022-12-22 MED ORDER — ORAL CARE MOUTH RINSE: 15.0000 mL | Freq: Once | OROMUCOSAL | Status: DC

## 2022-12-22 MED ORDER — CHLORHEXIDINE GLUCONATE 0.12 % MT SOLN: 15.0000 mL | Freq: Once | OROMUCOSAL | Status: DC

## 2022-12-22 MED ORDER — LACTATED RINGERS IV SOLN
INTRAVENOUS | Status: DC
Start: 2022-12-22 — End: 2022-12-23

## 2022-12-23 ENCOUNTER — Other Ambulatory Visit: Payer: Self-pay

## 2022-12-23 ENCOUNTER — Ambulatory Visit
Admission: RE | Admit: 2022-12-23 | Discharge: 2022-12-23 | Disposition: A | Discharge status: Home / Self Care | Source: Ambulatory Visit | Attending: Obstetrics and Gynecology | Admitting: Obstetrics and Gynecology

## 2022-12-23 ENCOUNTER — Ambulatory Visit: Admitting: Anesthesiology

## 2022-12-23 ENCOUNTER — Encounter
Admission: RE | Disposition: A | Discharge status: Home / Self Care | Payer: Self-pay | Source: Ambulatory Visit | Attending: Obstetrics and Gynecology

## 2022-12-23 ENCOUNTER — Ambulatory Visit: Admitting: Urgent Care

## 2022-12-23 ENCOUNTER — Encounter: Payer: Self-pay | Admitting: Obstetrics and Gynecology

## 2022-12-23 DIAGNOSIS — Z302 Encounter for sterilization: Secondary | ICD-10-CM | POA: Diagnosis present

## 2022-12-23 DIAGNOSIS — E876 Hypokalemia: Secondary | ICD-10-CM

## 2022-12-23 DIAGNOSIS — Z01812 Encounter for preprocedural laboratory examination: Secondary | ICD-10-CM

## 2022-12-23 HISTORY — PX: LAPAROSCOPIC TUBAL LIGATION: SHX1937

## 2022-12-23 LAB — POCT PREGNANCY, URINE: Preg Test, Ur: NEGATIVE

## 2022-12-23 SURGERY — LIGATION, FALLOPIAN TUBE, LAPAROSCOPIC
Anesthesia: General

## 2022-12-23 MED ORDER — OXYCODONE HCL 5 MG PO TABS
ORAL_TABLET | ORAL | Status: AC
  Filled 2022-12-23: qty 1

## 2022-12-23 MED ORDER — KETOROLAC TROMETHAMINE 30 MG/ML IJ SOLN
INTRAMUSCULAR | Status: AC
  Filled 2022-12-23: qty 1

## 2022-12-23 MED ORDER — FENTANYL CITRATE (PF) 100 MCG/2ML IJ SOLN
INTRAMUSCULAR | Status: AC
  Filled 2022-12-23: qty 2

## 2022-12-23 MED ORDER — HYDROCODONE-ACETAMINOPHEN 5-325 MG PO TABS: 1.0000 | ORAL_TABLET | Freq: Four times a day (QID) | ORAL | 0 refills | Status: DC | PRN

## 2022-12-23 MED ORDER — KETOROLAC TROMETHAMINE 30 MG/ML IJ SOLN
INTRAMUSCULAR | Status: DC | PRN
  Administered 2022-12-23: 30 mg via INTRAVENOUS

## 2022-12-23 MED ORDER — DEXAMETHASONE SODIUM PHOSPHATE 10 MG/ML IJ SOLN
INTRAMUSCULAR | Status: DC | PRN
  Administered 2022-12-23: 10 mg via INTRAVENOUS

## 2022-12-23 MED ORDER — ACETAMINOPHEN 10 MG/ML IV SOLN: 1000.0000 mg | Freq: Once | INTRAVENOUS | Status: DC | PRN

## 2022-12-23 MED ORDER — BUPIVACAINE HCL (PF) 0.5 % IJ SOLN
INTRAMUSCULAR | Status: AC
  Filled 2022-12-23: qty 30

## 2022-12-23 MED ORDER — DEXAMETHASONE SODIUM PHOSPHATE 10 MG/ML IJ SOLN
INTRAMUSCULAR | Status: AC
  Filled 2022-12-23: qty 1

## 2022-12-23 MED ORDER — CHLORHEXIDINE GLUCONATE 0.12 % MT SOLN
OROMUCOSAL | Status: AC
  Filled 2022-12-23: qty 15

## 2022-12-23 MED ORDER — OXYCODONE HCL 5 MG PO TABS
5.0000 mg | ORAL_TABLET | Freq: Once | ORAL | Status: AC | PRN
  Administered 2022-12-23: 5 mg via ORAL

## 2022-12-23 MED ORDER — MIDAZOLAM HCL 2 MG/2ML IJ SOLN
INTRAMUSCULAR | Status: DC | PRN
  Administered 2022-12-23: 2 mg via INTRAVENOUS

## 2022-12-23 MED ORDER — MIDAZOLAM HCL 2 MG/2ML IJ SOLN
INTRAMUSCULAR | Status: AC
  Filled 2022-12-23: qty 2

## 2022-12-23 MED ORDER — LIDOCAINE HCL (CARDIAC) PF 100 MG/5ML IV SOSY
PREFILLED_SYRINGE | INTRAVENOUS | Status: DC | PRN
  Administered 2022-12-23: 100 mg via INTRAVENOUS

## 2022-12-23 MED ORDER — SUGAMMADEX SODIUM 200 MG/2ML IV SOLN
INTRAVENOUS | Status: DC | PRN
  Administered 2022-12-23: 200 mg via INTRAVENOUS

## 2022-12-23 MED ORDER — ONDANSETRON HCL 4 MG/2ML IJ SOLN: 4.0000 mg | Freq: Once | INTRAMUSCULAR | Status: DC | PRN

## 2022-12-23 MED ORDER — PROPOFOL 1000 MG/100ML IV EMUL
INTRAVENOUS | Status: AC
  Filled 2022-12-23: qty 100

## 2022-12-23 MED ORDER — FENTANYL CITRATE (PF) 100 MCG/2ML IJ SOLN
25.0000 ug | INTRAMUSCULAR | Status: DC | PRN
  Administered 2022-12-23 (×4): 25 ug via INTRAVENOUS

## 2022-12-23 MED ORDER — ONDANSETRON HCL 4 MG/2ML IJ SOLN
INTRAMUSCULAR | Status: AC
  Filled 2022-12-23: qty 2

## 2022-12-23 MED ORDER — LACTATED RINGERS IV SOLN: INTRAVENOUS | Status: DC | PRN

## 2022-12-23 MED ORDER — ARTIFICIAL TEARS OPHTHALMIC OINT
TOPICAL_OINTMENT | OPHTHALMIC | Status: AC
  Filled 2022-12-23: qty 3.5

## 2022-12-23 MED ORDER — BUPIVACAINE HCL (PF) 0.5 % IJ SOLN
INTRAMUSCULAR | Status: DC | PRN
  Administered 2022-12-23: 8 mL

## 2022-12-23 MED ORDER — FENTANYL CITRATE (PF) 100 MCG/2ML IJ SOLN
INTRAMUSCULAR | Status: DC | PRN
  Administered 2022-12-23: 50 ug via INTRAVENOUS

## 2022-12-23 MED ORDER — OXYCODONE HCL 5 MG/5ML PO SOLN: 5.0000 mg | Freq: Once | ORAL | Status: AC | PRN

## 2022-12-23 MED ORDER — ROCURONIUM BROMIDE 100 MG/10ML IV SOLN
INTRAVENOUS | Status: DC | PRN
  Administered 2022-12-23: 50 mg via INTRAVENOUS

## 2022-12-23 MED ORDER — 0.9 % SODIUM CHLORIDE (POUR BTL) OPTIME
TOPICAL | Status: DC | PRN
  Administered 2022-12-23: 500 mL

## 2022-12-23 MED ORDER — ONDANSETRON HCL 4 MG/2ML IJ SOLN
INTRAMUSCULAR | Status: DC | PRN
  Administered 2022-12-23: 4 mg via INTRAVENOUS

## 2022-12-23 MED ORDER — PROPOFOL 10 MG/ML IV BOLUS
INTRAVENOUS | Status: DC | PRN
  Administered 2022-12-23: 150 mg via INTRAVENOUS
  Administered 2022-12-23: 150 ug/kg/min via INTRAVENOUS

## 2022-12-23 SURGICAL SUPPLY — 29 items
BLADE SURG SZ11 CARB STEEL (BLADE) ×1 IMPLANT
CATH ROBINSON RED A/P 16FR (CATHETERS) ×1 IMPLANT
CHLORAPREP W/TINT 26 (MISCELLANEOUS) ×1 IMPLANT
CLIP FILSHIE TUBAL LIGA STRL (Clip) ×1 IMPLANT
DERMABOND ADVANCED .7 DNX12 (GAUZE/BANDAGES/DRESSINGS) ×1 IMPLANT
DURAPREP 26ML APPLICATOR (WOUND CARE) IMPLANT
GAUZE 4X4 16PLY ~~LOC~~+RFID DBL (SPONGE) ×1 IMPLANT
GLOVE PI ORTHO PRO STRL 7.5 (GLOVE) ×2 IMPLANT
GOWN STRL REUS W/ TWL LRG LVL3 (GOWN DISPOSABLE) ×1 IMPLANT
GOWN STRL REUS W/ TWL XL LVL3 (GOWN DISPOSABLE) ×2 IMPLANT
IRRIGATION STRYKERFLOW (MISCELLANEOUS) IMPLANT
IRRIGATOR STRYKERFLOW (MISCELLANEOUS)
IV LACTATED RINGERS 1000ML (IV SOLUTION) IMPLANT
KIT PINK PAD W/HEAD ARE REST (MISCELLANEOUS) ×1
KIT PINK PAD W/HEAD ARM REST (MISCELLANEOUS) ×1 IMPLANT
KIT TURNOVER CYSTO (KITS) ×1 IMPLANT
MANIFOLD NEPTUNE II (INSTRUMENTS) ×1 IMPLANT
NS IRRIG 500ML POUR BTL (IV SOLUTION) ×1 IMPLANT
PACK GYN LAPAROSCOPIC (MISCELLANEOUS) ×1 IMPLANT
PAD PREP OB/GYN DISP 24X41 (PERSONAL CARE ITEMS) ×1 IMPLANT
SET TUBE SMOKE EVAC HIGH FLOW (TUBING) ×1 IMPLANT
SOL PREP PVP 2OZ (MISCELLANEOUS) ×1
SOLUTION PREP PVP 2OZ (MISCELLANEOUS) ×1 IMPLANT
SUT VIC AB 4-0 FS2 27 (SUTURE) IMPLANT
SUT VICRYL 0 UR6 27IN ABS (SUTURE) ×1 IMPLANT
TRAP FLUID SMOKE EVACUATOR (MISCELLANEOUS) ×1 IMPLANT
TROCAR Z-THREAD FIOS 12X100MM (TROCAR) ×1 IMPLANT
TROCAR Z-THREAD FIOS 5X100MM (TROCAR) IMPLANT
WATER STERILE IRR 500ML POUR (IV SOLUTION) ×1 IMPLANT

## 2022-12-23 NOTE — Anesthesia Postprocedure Evaluation (Signed)
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  Procedure(s) Performed: LAPAROSCOPIC TUBAL LIGATION WITH FILSHIE CLIPS  Patient location during evaluation: PACU Anesthesia Type: General Level of consciousness: awake and alert Pain management: pain level controlled Vital Signs Assessment: post-procedure vital signs reviewed and stable Respiratory status: spontaneous breathing, nonlabored ventilation, respiratory function stable and patient connected to nasal cannula oxygen Cardiovascular status: blood pressure returned to baseline and stable Postop Assessment: no apparent nausea or vomiting Anesthetic complications: no   No notable events documented.   Last Vitals:  Vitals:   12/23/22 1200 12/23/22 1218  BP: (!) 158/89   Pulse: 68 68  Resp: 15 16  Temp:  36.4 C  SpO2: 97% 100%    Last Pain:  Vitals:   12/23/22 1218  TempSrc: Temporal  PainSc: 2                  Corinda Gubler

## 2022-12-23 NOTE — Transfer of Care (Signed)
Immediate Anesthesia Transfer of Care Note  Patient: Sydney James  Procedure(s) Performed: LAPAROSCOPIC TUBAL LIGATION WITH FILSHIE CLIPS  Patient Location: PACU  Anesthesia Type:General  Level of Consciousness: awake, alert , and oriented  Airway & Oxygen Therapy: Patient Spontanous Breathing  Post-op Assessment: Report given to RN and Post -op Vital signs reviewed and stable  Post vital signs: Reviewed and stable  Last Vitals:  Vitals Value Taken Time  BP 137/84 12/23/22 1055  Temp 36.6 C 12/23/22 1055  Pulse 71 12/23/22 1058  Resp 18 12/23/22 1058  SpO2 97 % 12/23/22 1058    Last Pain:  Vitals:   12/23/22 1055  TempSrc:   PainSc: Asleep         Complications: No notable events documented.

## 2022-12-23 NOTE — Op Note (Signed)
     OPERATIVE NOTE 12/23/2022 10:47 AM  PRE-OPERATIVE DIAGNOSIS:  1) Desires permanent sterilization  POST-OPERATIVE DIAGNOSIS:  * No Diagnosis Codes entered *  OPERATION:  LAPAROSCOPIC TUBAL LIGATION WITH FILSHIE CLIPS: 84132 (CPT)  SURGEON(S): Surgeons and Role:    Linzie Collin, MD - Primary   ANESTHESIA: Choice  ESTIMATED BLOOD LOSS: 5mL  OPERATIVE FINDINGS: Normal pelvis  SPECIMEN: * No specimens in log *  COMPLICATIONS: None  DISPOSITION: Stable to recovery room  DESCRIPTION OF PROCEDURE:      The patient was prepped and draped in the dorsolithotomy position and placed under general anesthesia. The bladder was emptied. The cervix was grasped with a multi-toothed tenaculum and a uterine manipulator was placed within the cervical os respecting the position and curvature of the uterus. After changing gloves we proceeded abdominally. A small infraumbilical incision was made and a 12 mm trocar port was placed within the abdominopelvic cavity. The opening pressure was less than 7 mmHg.  Approximately 3 and 1/2 L of carbon dioxide gas was instilled within the abdominal pelvic cavity. The laparoscope was placed and the pelvis and abdomen were carefully inspected.  Under direct visualization right and left lower quadrant ports were placed. The fallopian tubes were visualized and followed out to their fine fimbriated end.  Each tube was elevated on a Filshie clip and clamped in a perpendicular manner.  The midpoint of the tube in the area of decreased vascularity.  Each tube was carefully inspected to make sure that the clip was perpendicular across the fallopian tube and occluded the entire tube.  Hemostasis of all areas of the pelvis was noted. Hemostasis was noted.  The laparoscope was removed and the gas was allowed to escape.  The trocar sleeve was removed and the incision was closed by subcuticular closure. A long-acting anesthetic was injected.  Dermabond was applied. The  uterine manipulator was removed. Hemostasis of the cervix was noted. The patient went to the recovery room in stable condition.  Elonda Husky, M.D. 12/23/2022 10:47 AM
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
## 2022-12-23 NOTE — Interval H&P Note (Signed)
History and Physical Interval Note:  12/23/2022 9:28 AM  Sydney James  has presented today for surgery, with the diagnosis of Desires permanent sterilization.  The various methods of treatment have been discussed with the patient and family. After consideration of risks, benefits and other options for treatment, the patient has consented to  Procedure(s): LAPAROSCOPIC TUBAL LIGATION WITH FILSHIE CLIPS (N/A) as a surgical intervention.  The patient's history has been reviewed, patient examined, no change in status, stable for surgery.  I have reviewed the patient's chart and labs.  Questions were answered to the patient's satisfaction.     Brennan Bailey

## 2022-12-26 ENCOUNTER — Encounter: Payer: Self-pay | Admitting: Obstetrics and Gynecology

## 2023-01-03 ENCOUNTER — Encounter: Payer: Self-pay | Admitting: Obstetrics and Gynecology

## 2023-01-03 ENCOUNTER — Ambulatory Visit: Admitting: Obstetrics and Gynecology

## 2023-01-03 VITALS — BP 140/96 | HR 69 | Ht 65.0 in | Wt 202.4 lb

## 2023-01-03 DIAGNOSIS — Z4889 Encounter for other specified surgical aftercare: Secondary | ICD-10-CM

## 2023-01-03 DIAGNOSIS — Z9889 Other specified postprocedural states: Secondary | ICD-10-CM

## 2023-01-03 NOTE — Progress Notes (Signed)
 Patient presents for 1.5 week postop follow-up following BTL. She reports no pain or urinary discomfort.

## 2023-01-03 NOTE — Progress Notes (Signed)
 HPI:      [name] is a 27 y.o. [phone] who LMP was Patient's last menstrual period was 12/31/2022 (exact date).  Subjective:   She presents today approximately week and a half postop from permanent sterilization/tubal ligation with Filshie clips.  She reports she is not having any issues.  She is having minimal pain.  She has voiding having bowel movements and eating without difficulty.    Hx: The following portions of the patient's history were reviewed and updated as appropriate:             She  has a past medical history of Chondral defect of left patella, GERD (gastroesophageal reflux disease), Lumbar herniated disc, Migraine, Motion sickness, Preeclampsia, PVC's (premature ventricular contractions), Raynaud's disease, and Scoliosis. She does not have any pertinent problems on file. She  has a past surgical history that includes Dilation and evacuation (N/A, 03/15/2016); Wisdom tooth extraction; Cesarean section (N/A, 12/22/2017); Dilation and evacuation (N/A, 07/18/2019); Knee arthroscopy (Left, 12/13/2021); Knee surgery (Left, 06/07/2022); and Laparoscopic tubal ligation (N/A, 12/23/2022). Her family history includes Cancer in her maternal grandfather, maternal grandmother, and paternal grandmother; Diabetes in her maternal grandfather and paternal grandfather; Early death in her mother; Hypertension in her father and mother. She  reports that she has never smoked. She has never used smokeless tobacco. She reports that she does not drink alcohol and does not use drugs. She has a current medication list which includes the following prescription(s): multivitamin-prenatal and sertraline . She is allergic to chlorhexidine , aloe, lubricants, monistat [miconazole], sulfa antibiotics, and tape.       Review of Systems:  Review of Systems  Constitutional: Denied constitutional symptoms, night sweats, recent illness, fatigue, fever, insomnia and weight loss.  Eyes: Denied eye symptoms,  eye pain, photophobia, vision change and visual disturbance.  Ears/Nose/Throat/Neck: Denied ear, nose, throat or neck symptoms, hearing loss, nasal discharge, sinus congestion and sore throat.  Cardiovascular: Denied cardiovascular symptoms, arrhythmia, chest pain/pressure, edema, exercise intolerance, orthopnea and palpitations.  Respiratory: Denied pulmonary symptoms, asthma, pleuritic pain, productive sputum, cough, dyspnea and wheezing.  Gastrointestinal: Denied, gastro-esophageal reflux, melena, nausea and vomiting.  Genitourinary: Denied genitourinary symptoms including symptomatic vaginal discharge, pelvic relaxation issues, and urinary complaints.  Musculoskeletal: Denied musculoskeletal symptoms, stiffness, swelling, muscle weakness and myalgia.  Dermatologic: Denied dermatology symptoms, rash and scar.  Neurologic: Denied neurology symptoms, dizziness, headache, neck pain and syncope.  Psychiatric: Denied psychiatric symptoms, anxiety and depression.  Endocrine: Denied endocrine symptoms including hot flashes and night sweats.   Meds:   Current Outpatient Medications on File Prior to Visit  Medication Sig Dispense Refill   Prenatal Vit-Fe Fumarate-FA (MULTIVITAMIN-PRENATAL) 27-0.8 MG TABS tablet Take 1 tablet by mouth daily.     sertraline  (ZOLOFT ) 50 MG tablet Take 1 tablet (50 mg total) by mouth daily. 90 tablet 2   No current facility-administered medications on file prior to visit.      Objective:     Vitals:   01/03/23 0939  BP: (!) 140/96  Pulse: 69   Filed Weights   01/03/23 0939  Weight: 202 lb 6.4 oz (91.8 kg)               Abdomen: Soft.  Non-tender.  No masses.  No HSM.  Incision/s: Intact.  Healing well.  No erythema.  No drainage.             Assessment:    [id number] Patient Active Problem List   Diagnosis Date Noted   Decreased appetite 09/27/2022  Dysmenorrhea 08/22/2022   UTI (urinary tract infection) 08/20/2022   Healthcare maintenance  08/01/2022   Menorrhagia 08/01/2022   Chondral defect of patella 06/07/2022   Tonsillitis 04/17/2022   Anxiety 02/13/2022   Chondral defect of left patella 12/14/2021   Menstrual changes 11/15/2021   Knee pain, bilateral 09/27/2021   Weight gain 09/08/2021   Dizziness 09/08/2021   Fetal demise due to miscarriage 07/13/2021   DDD (degenerative disc disease), lumbar 11/30/2019   History of migraine headaches 11/30/2019   Essential hypertension 09/05/2019   Sleep disturbance 09/05/2019   Obesity 02/27/2019   Vitamin D  deficiency 11/02/2016     1. Postoperative state     Patient doing well postop-no issues   Plan:            1.  May resume normal activities with exception of heavy lifting.  May resume intercourse and 1 week. Orders No orders of the defined types were placed in this encounter.   No orders of the defined types were placed in this encounter.     F/U  Return for Annual Physical.  Alm DOROTHA Sar, M.D. 01/03/2023 10:01 AM

## 2023-01-04 IMAGING — US US OB < 14 WEEKS - US OB TV
1 series · 14 of 28 positions shown · non-contrast
Comparison: None.

CLINICAL DATA: First trimester pregnancy, unknown gestational age

EXAM:
OBSTETRIC <14 WK US AND TRANSVAGINAL OB US
TECHNIQUE: Both transabdominal and transvaginal ultrasound examinations were
performed for complete evaluation of the gestation as well as the
maternal uterus, adnexal regions, and pelvic cul-de-sac.
Transvaginal technique was performed to assess early pregnancy.

[Series 1: us ob less than 14 weeks with ob transvaginal · 14 of 77 slices shown]
[im 3/77]
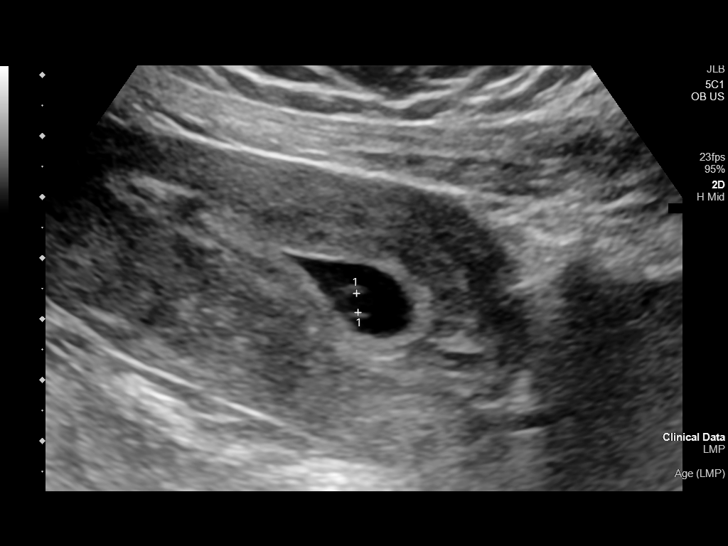
[im 9/77]
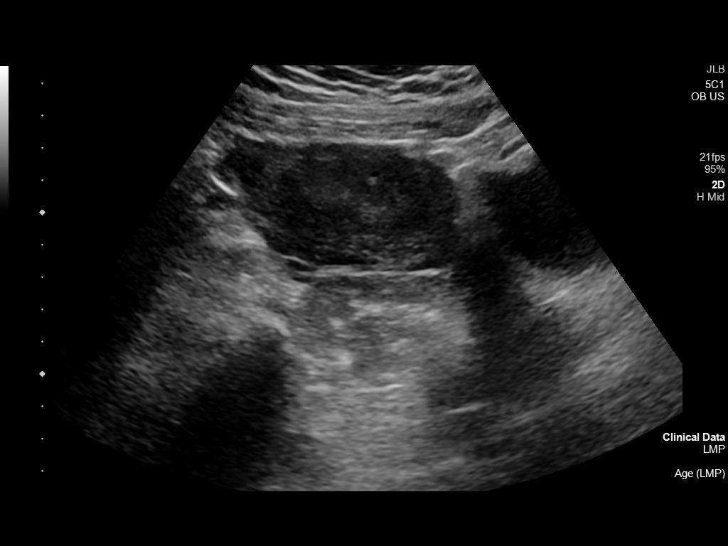
[im 15/77]
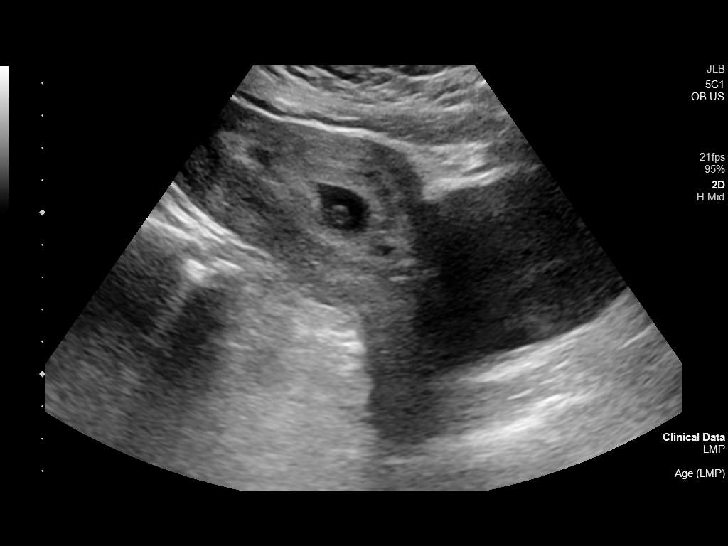
[im 20/77]
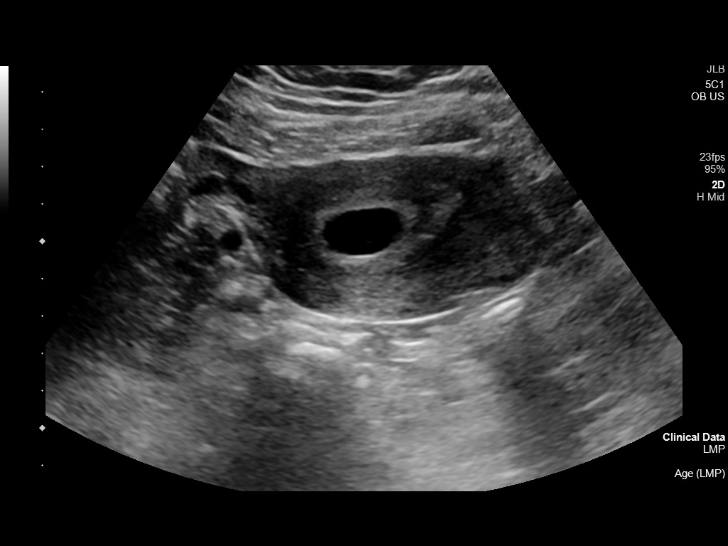
[im 26/77]
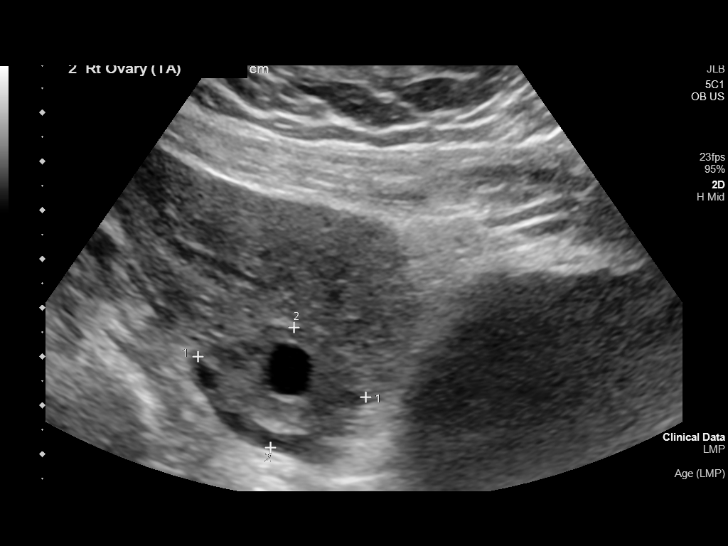
[im 31/77]
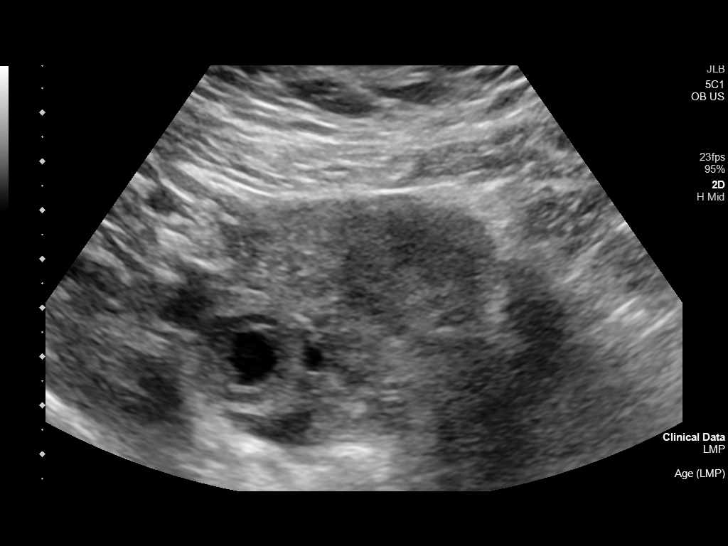
[im 37/77]
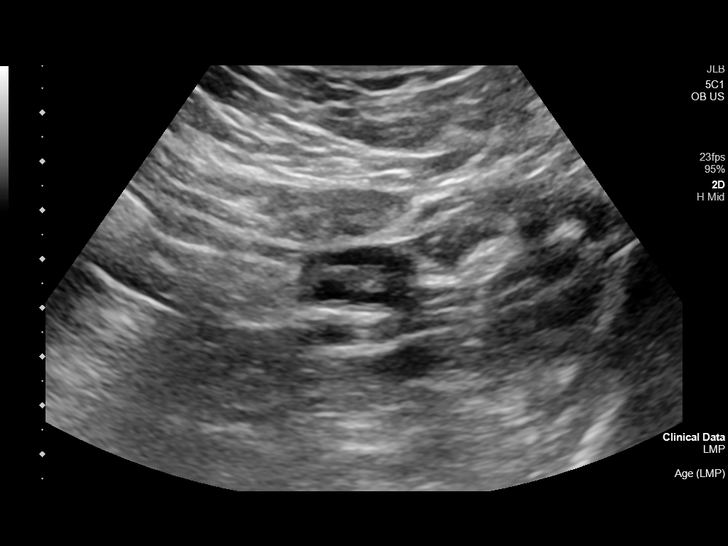
[im 43/77]
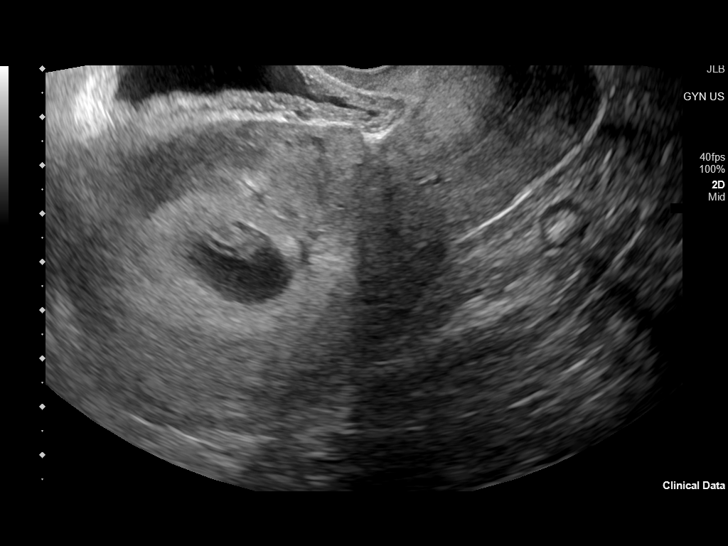
[im 48/77]
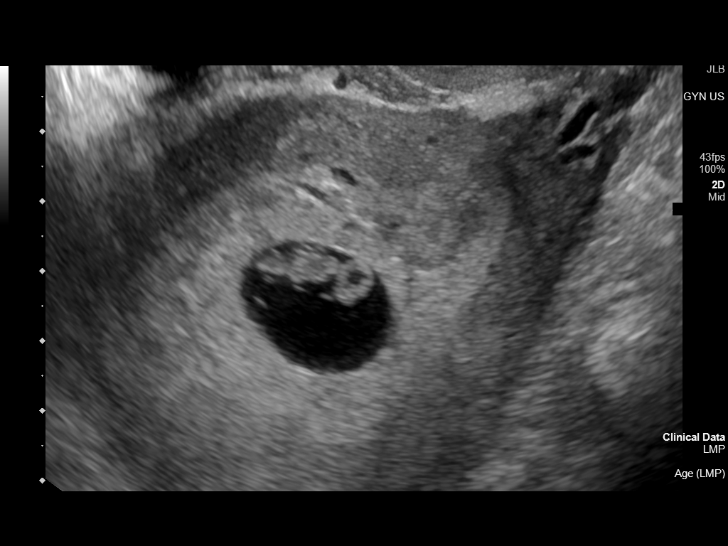
[im 54/77]
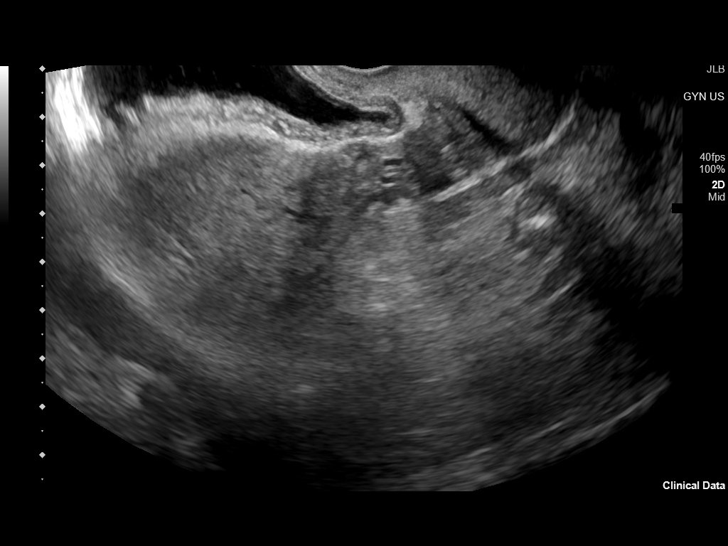
[im 60/77]
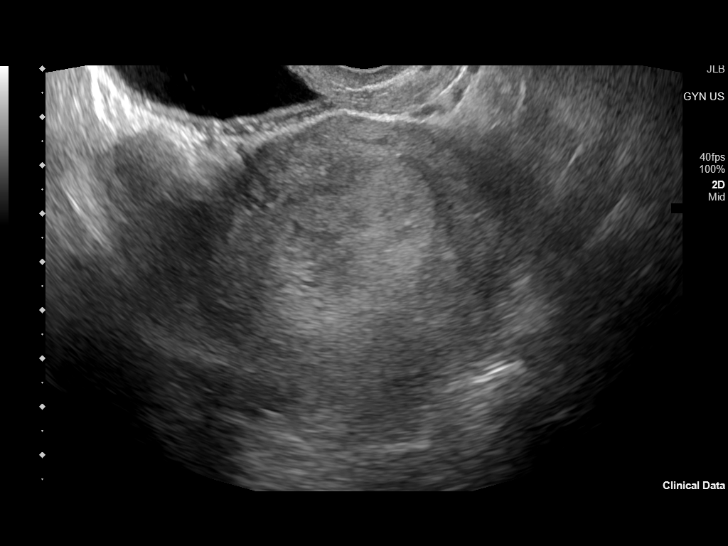
[im 65/77]
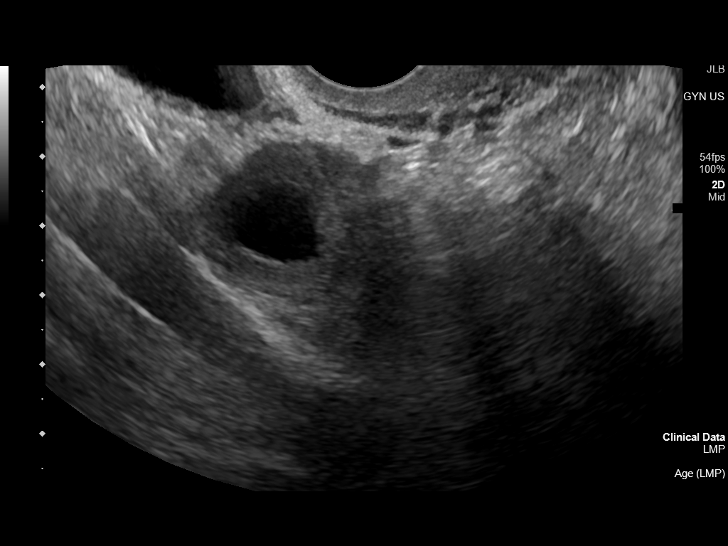
[im 71/77]
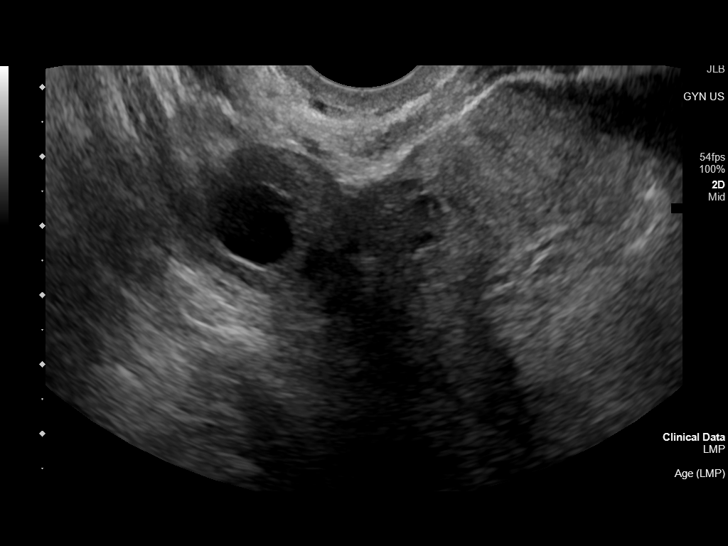
[im 77/77]
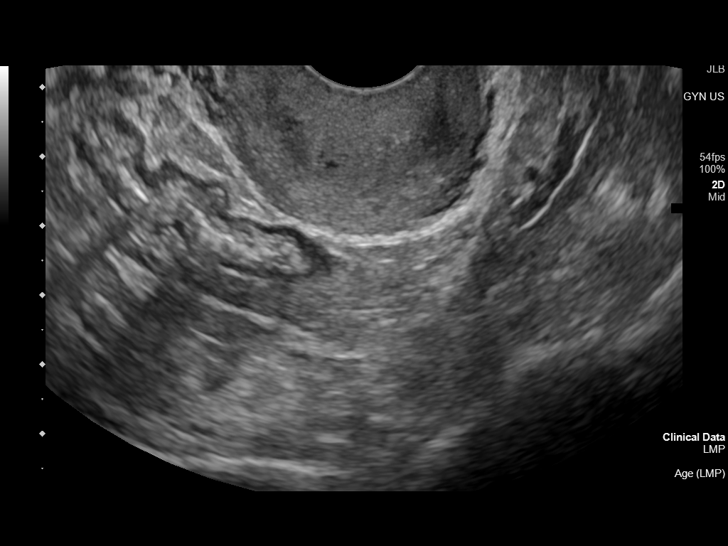

[14 of 28 positions shown; findings below may reference images not displayed]

FINDINGS: Intrauterine gestational sac: Single

Yolk sac:  Visualized.

Embryo:  Visualized.

Cardiac Activity: Visualized.

Heart Rate: 143 bpm

CRL: 12.0 mm   7 w   3 d                  US EDC: 10/20/2020

Subchorionic hemorrhage:  None visualized.

Maternal uterus/adnexae: Right ovary measures 3.6 x 2.6 x 2.3 cm,
with likely corpus luteum cyst. Left ovary measures 3.9 x 1.5 x
cm. No adnexal mass or free fluid.
IMPRESSION: 1. Single live intrauterine pregnancy as above, estimated age 7
weeks and 3 days.

## 2023-02-15 ENCOUNTER — Other Ambulatory Visit: Payer: Self-pay | Admitting: Certified Nurse Midwife

## 2023-02-15 ENCOUNTER — Other Ambulatory Visit (HOSPITAL_COMMUNITY)
Admission: RE | Admit: 2023-02-15 | Discharge: 2023-02-15 | Disposition: A | Discharge status: Home / Self Care | Source: Ambulatory Visit | Attending: Obstetrics and Gynecology | Admitting: Obstetrics and Gynecology

## 2023-02-15 ENCOUNTER — Ambulatory Visit (INDEPENDENT_AMBULATORY_CARE_PROVIDER_SITE_OTHER)

## 2023-02-15 ENCOUNTER — Telehealth: Payer: Self-pay

## 2023-02-15 VITALS — BP 152/103 | HR 71 | Ht 65.0 in | Wt 200.0 lb

## 2023-02-15 DIAGNOSIS — B379 Candidiasis, unspecified: Secondary | ICD-10-CM | POA: Diagnosis present

## 2023-02-15 MED ORDER — FLUCONAZOLE 150 MG PO TABS: 150.0000 mg | ORAL_TABLET | Freq: Every day | ORAL | 0 refills | Status: DC

## 2023-02-15 MED ORDER — FLUCONAZOLE 150 MG PO TABS: 150.0000 mg | ORAL_TABLET | Freq: Every day | ORAL | 5 refills | Status: DC

## 2023-02-15 NOTE — Telephone Encounter (Signed)
Called pt, no answer, LVMTRC.

## 2023-02-15 NOTE — Telephone Encounter (Signed)
Pt came in office for a self swab visit. States she gets yeast infections all the time. Wants to know what she needs to do to get a Diflucan Rx with refills since she gets yeast all the time. She is allergic to monistat.

## 2023-02-15 NOTE — Patient Instructions (Signed)

## 2023-02-15 NOTE — Progress Notes (Signed)
    NURSE VISIT NOTE  Subjective:    Patient ID: Sydney James, female    DOB: Jul 16, 1995, 28 y.o.   MRN: [id number]  HPI  Patient is a 28 y.o. [phone] female who presents for a self swab for a yeast infection. Having vaginal discharge, vaginal itching and irritation. Denies abnormal vaginal bleeding or significant pelvic pain or fever. Denies UTI symptoms.  Objective:    BP (!) 152/103   Pulse 71   Ht 5\' 5"  (1.651 m)   Wt 200 lb (90.7 kg)   LMP 01/31/2023 (Exact Date)   BMI 33.28 kg/m     Assessment:   1. Yeast infection      Plan:   Aptima sent to lab. Treatment: Diflucan sent. Allergic to Monistat. ROV prn if symptoms persist or worsen.   Donnetta Hail, CMA

## 2023-02-16 LAB — CERVICOVAGINAL ANCILLARY ONLY
Bacterial Vaginitis (gardnerella): POSITIVE — AB
Candida Glabrata: NEGATIVE
Candida Vaginitis: POSITIVE — AB
Comment: NEGATIVE
Comment: NEGATIVE
Comment: NEGATIVE

## 2023-02-16 NOTE — Telephone Encounter (Signed)
Pt aware.

## 2023-02-17 ENCOUNTER — Other Ambulatory Visit: Payer: Self-pay

## 2023-02-17 MED ORDER — METRONIDAZOLE 500 MG PO TABS: 500.0000 mg | ORAL_TABLET | Freq: Two times a day (BID) | ORAL | 0 refills | Status: DC

## 2023-03-24 ENCOUNTER — Other Ambulatory Visit (HOSPITAL_COMMUNITY)
Admission: RE | Admit: 2023-03-24 | Discharge: 2023-03-24 | Disposition: A | Discharge status: Home / Self Care | Source: Ambulatory Visit | Attending: Certified Nurse Midwife | Admitting: Certified Nurse Midwife

## 2023-03-24 ENCOUNTER — Ambulatory Visit (INDEPENDENT_AMBULATORY_CARE_PROVIDER_SITE_OTHER): Payer: Self-pay | Admitting: Certified Nurse Midwife

## 2023-03-24 ENCOUNTER — Encounter: Payer: Self-pay | Admitting: Certified Nurse Midwife

## 2023-03-24 VITALS — BP 142/98 | HR 71 | Ht 65.5 in | Wt 200.0 lb

## 2023-03-24 DIAGNOSIS — Z124 Encounter for screening for malignant neoplasm of cervix: Secondary | ICD-10-CM | POA: Insufficient documentation

## 2023-03-24 DIAGNOSIS — Z01419 Encounter for gynecological examination (general) (routine) without abnormal findings: Secondary | ICD-10-CM | POA: Insufficient documentation

## 2023-03-24 DIAGNOSIS — N920 Excessive and frequent menstruation with regular cycle: Secondary | ICD-10-CM
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
## 2023-03-24 NOTE — Addendum Note (Signed)
 Addended by: Sheliah Hatch on: 03/24/2023 10:03 AM   Modules accepted: Orders

## 2023-03-24 NOTE — Patient Instructions (Signed)
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
## 2023-03-25 LAB — COMPREHENSIVE METABOLIC PANEL
ALT: 13 IU/L (ref 0–32)
AST: 16 IU/L (ref 0–40)
Albumin: 4.6 g/dL (ref 4.0–5.0)
Alkaline Phosphatase: 75 IU/L (ref 44–121)
BUN/Creatinine Ratio: 14 (ref 9–23)
BUN: 11 mg/dL (ref 6–20)
Bilirubin Total: 0.5 mg/dL (ref 0.0–1.2)
CO2: 21 mmol/L (ref 20–29)
Calcium: 9.1 mg/dL (ref 8.7–10.2)
Chloride: 103 mmol/L (ref 96–106)
Creatinine, Ser: 0.76 mg/dL (ref 0.57–1.00)
Globulin, Total: 2.2 g/dL (ref 1.5–4.5)
Glucose: 83 mg/dL (ref 70–99)
Potassium: 4.1 mmol/L (ref 3.5–5.2)
Sodium: 141 mmol/L (ref 134–144)
Total Protein: 6.8 g/dL (ref 6.0–8.5)
eGFR: 109 mL/min/{1.73_m2} (ref >59)

## 2023-03-25 LAB — CBC
Hematocrit: 44.1 % (ref 34.0–46.6)
Hemoglobin: 14.5 g/dL (ref 11.1–15.9)
MCH: 28.3 pg (ref 26.6–33.0)
MCHC: 32.9 g/dL (ref 31.5–35.7)
MCV: 86 fL (ref 79–97)
Platelets: 293 10*3/uL (ref 150–450)
RBC: 5.13 x10E6/uL (ref 3.77–5.28)
RDW: 12.6 % (ref 11.7–15.4)
WBC: 8.2 10*3/uL (ref 3.4–10.8)

## 2023-03-25 LAB — VITAMIN D 25 HYDROXY (VIT D DEFICIENCY, FRACTURES): Vit D, 25-Hydroxy: 18.7 ng/mL — ABNORMAL LOW (ref 30.0–100.0)

## 2023-03-25 LAB — TSH+FREE T4
Free T4: 1.22 ng/dL (ref 0.82–1.77)
TSH: 1.13 u[IU]/mL (ref 0.450–4.500)

## 2023-03-27 ENCOUNTER — Other Ambulatory Visit: Payer: Self-pay | Admitting: Certified Nurse Midwife

## 2023-03-27 ENCOUNTER — Encounter: Payer: Self-pay | Admitting: Certified Nurse Midwife

## 2023-03-27 MED ORDER — CHOLECALCIFEROL 25 MCG (1000 UT) PO CAPS: 1000.0000 [IU] | ORAL_CAPSULE | Freq: Every day | ORAL | 3 refills | Status: AC

## 2023-03-28 LAB — CYTOLOGY - PAP: Diagnosis: NEGATIVE

## 2023-05-18 ENCOUNTER — Telehealth: Admitting: Nurse Practitioner

## 2023-05-18 ENCOUNTER — Ambulatory Visit: Payer: Self-pay

## 2023-05-18 ENCOUNTER — Other Ambulatory Visit: Payer: Self-pay | Admitting: Orthopedic Surgery

## 2023-05-18 ENCOUNTER — Telehealth: Payer: Self-pay | Admitting: Nurse Practitioner

## 2023-05-18 ENCOUNTER — Encounter: Payer: Self-pay | Admitting: Nurse Practitioner

## 2023-05-18 VITALS — Temp 101.0°F

## 2023-05-18 DIAGNOSIS — A084 Viral intestinal infection, unspecified: Secondary | ICD-10-CM | POA: Insufficient documentation

## 2023-05-18 MED ORDER — ONDANSETRON HCL 4 MG PO TABS: 4.0000 mg | ORAL_TABLET | Freq: Three times a day (TID) | ORAL | 0 refills | Status: DC | PRN

## 2023-05-18 NOTE — Telephone Encounter (Signed)
 Chief Complaint: Vomiting Symptoms: Abdominal pain, vomiting, diarrhea, nausea Frequency: started this morning Pertinent Negatives: Patient denies fever Disposition: [] ED /[] Urgent Care (no appt availability in office) / [x] Appointment(In office/virtual)/ []  [facility]/ [] Home Care/ [] Refused Recommended Disposition /[] [facility]/ []  Follow-up with PCP Additional Notes: patient's husband calling for patient with concerns of patient having had vomiting, diarrhea, abdominal pain and nausea since around 2:00 AM. Husband states he thinks it might be a stomach bug as their son had similar symptoms two days ago. Husband is calling from out of town so he isn't with the patient. Patient is requesting medication to help with her symptoms. Patient requesting a video visit as she is not feeling well enough to come into the office. Per protocol patient is recommended to be seen today. Video visit set up for today at 10:40 AM with another provider from PCP office. Patient husband verbalized understanding of the plan and the setup of the appointment. All questions were answered.    Copied from CRM [id number]. Topic: Clinical - Red Word Triage >> May 18, 2023  8:51 AM Orien Bird wrote: [name] that prompted transfer to Nurse Triage: Patient is very bad pain and she's been throwing up and having diarrhea and she is been trying to keep fluids down but it keeps coming up. Patient stated she feels as if she has a fever as well. Reason for Disposition  [1] Constant abdominal pain AND [2] present > 2 hours  [1] MILD-MODERATE pain AND [2] constant AND [3] present > 2 hours  Answer Assessment - Initial Assessment Questions 1. LOCATION: "Where does it hurt?"      Generalized abdominal pain 2. RADIATION: "Does the pain shoot anywhere else?" (e.g., chest, back)     no 3. ONSET: "When did the pain begin?" (e.g., minutes, hours or days ago)      Started this morning 4. SUDDEN: "Gradual or sudden  onset?"     Sudden onset 5. PATTERN "Does the pain come and go, or is it constant?"    - If it comes and goes: "How long does it last?" "Do you have pain now?"     (Note: Comes and goes means the pain is intermittent. It goes away completely between bouts.)    - If constant: "Is it getting better, staying the same, or getting worse?"      (Note: Constant means the pain never goes away completely; most serious pain is constant and gets worse.)      constant 6. SEVERITY: "How bad is the pain?"  (e.g., Scale 1-10; mild, moderate, or severe)    - MILD (1-3): Doesn't interfere with normal activities, abdomen soft and not tender to touch.     - MODERATE (4-7): Interferes with normal activities or awakens from sleep, abdomen tender to touch.     - SEVERE (8-10): Excruciating pain, doubled over, unable to do any normal activities.       Moderate-Severe 7. RECURRENT SYMPTOM: "Have you ever had this type of stomach pain before?" If Yes, ask: "When was the last time?" and "What happened that time?"      no 8. CAUSE: "What do you think is causing the stomach pain?"     Possible stomach bug 9. RELIEVING/AGGRAVATING FACTORS: "What makes it better or worse?" (e.g., antacids, bending or twisting motion, bowel movement)      10. OTHER SYMPTOMS: "Do you have any other symptoms?" (e.g., back pain, diarrhea, fever, urination pain, vomiting)  Diarrhea, vomiting 11. PREGNANCY: "Is there any chance you are pregnant?" "When was your last menstrual period?"       no  Answer Assessment - Initial Assessment Questions 1. VOMITING SEVERITY: "How many times have you vomited in the past 24 hours?"     - MILD:  1 - 2 times/day    - MODERATE: 3 - 5 times/day, decreased oral intake without significant weight loss or symptoms of dehydration    - SEVERE: 6 or more times/day, vomits everything or nearly everything, with significant weight loss, symptoms of dehydration      Moderate-husband calling-not entirely sure of  how many times she vomited 2. ONSET: "When did the vomiting begin?"      Started this morning 3. FLUIDS: "What fluids or food have you vomited up today?" "Have you been able to keep any fluids down?"     no 4. ABDOMEN PAIN: "Are your having any abdomen pain?" If Yes : "How bad is it and what does it feel like?" (e.g., crampy, dull, intermittent, constant)      Yes crampy 5. DIARRHEA: "Is there any diarrhea?" If Yes, ask: "How many times today?"      yes 6. CONTACTS: "Is there anyone else in the family with the same symptoms?"      Patient's son had same symptoms two days ago 7. CAUSE: "What do you think is causing your vomiting?"     Possible stomach bug 8. HYDRATION STATUS: "Any signs of dehydration?" (e.g., dry mouth [not only dry lips], too weak to stand) "When did you last urinate?"     Unsure per husband 9. OTHER SYMPTOMS: "Do you have any other symptoms?" (e.g., fever, headache, vertigo, vomiting blood or coffee grounds, recent head injury)     nausea 10. PREGNANCY: "Is there any chance you are pregnant?" "When was your last menstrual period?"       no  Protocols used: Vomiting-A-AH, Abdominal Pain - [facility]

## 2023-05-18 NOTE — Progress Notes (Signed)
 Virtual Visit via Video Note  I connected with Sydney James on 05/18/23 at 2:46 PM by a video enabled telemedicine application and verified that I am speaking with the correct person using two identifiers.  Patient Location: Home Provider Location: Office/Clinic  I discussed the limitations, risks, security, and privacy concerns of performing an evaluation and management service by video and the availability of in person appointments. I also discussed with the patient that there may be a patient responsible charge related to this service. The patient expressed understanding and agreed to proceed.  Subjective: PCP: Dellar Fenton, MD  Chief Complaint  Patient presents with   Acute Visit    C/O vomiting and diarrhea    HPI Sydney James is a 28 year old female who presents with nausea, vomiting, and diarrhea.  Symptoms began last night at 11 PM with severe vomiting until 3 AM, followed by persistent diarrhea into the morning. Vomiting has decreased, but nausea and diarrhea continue. Fever started around 8 AM today. She is unable to retain food or fluids, including water, saltine crackers, and ginger ale, with vomitus primarily water and yellow bile. Diarrhea is watery, varying between clear and brown, occurring every 30 minutes. No abdominal pain or cramping. Her two-year-old son had similar symptoms two nights ago.  LMP: 04/28/23  ROS: Per HPI  Current Outpatient Medications:    Cholecalciferol  25 MCG (1000 UT) capsule, Take 1 capsule (1,000 Units total) by mouth daily., Disp: 90 capsule, Rfl: 3   ondansetron  (ZOFRAN ) 4 MG tablet, Take 1 tablet (4 mg total) by mouth every 8 (eight) hours as needed for nausea or vomiting., Disp: 20 tablet, Rfl: 0  Observations/Objective: Today's Vitals   05/18/23 1207  Temp: (!) 101 F (38.3 C)   Physical Exam Constitutional:      Appearance: Normal appearance. She is ill-appearing.  Eyes:     Conjunctiva/sclera: Conjunctivae normal.   Pulmonary:     Effort: No respiratory distress.  Neurological:     Mental Status: She is alert.  Psychiatric:        Mood and Affect: Mood normal.        Behavior: Behavior normal.        Thought Content: Thought content normal.     Assessment and Plan: Viral gastroenteritis Assessment & Plan: Acute viral gastroenteritis with diarrhea, nausea, vomiting and fever. Denise abdominal pain.  - Will start of zofran  for nausea and vomiting, every 8 hours. - Advise hydration with electrolyte solutions like Pedialyte or Gatorade. - Instruct to take small sips of fluids and gradually increase as tolerated. - Advise against using anti-diarrheal medications like Imodium. - ED precaution provided if symptoms increases and oral intake remains inadequate.    Other orders -     Ondansetron  HCl; Take 1 tablet (4 mg total) by mouth every 8 (eight) hours as needed for nausea or vomiting.  Dispense: 20 tablet; Refill: 0    Follow Up Instructions: No follow-ups on file.   I discussed the assessment and treatment plan with the patient. The patient was provided an opportunity to ask questions, and all were answered. The patient agreed with the plan and demonstrated an understanding of the instructions.   The patient was advised to call back or seek an in-person evaluation if the symptoms worsen or if the condition fails to improve as anticipated.  The above assessment and management plan was discussed with the patient. The patient verbalized understanding of and has agreed to the management plan.  Jaxiel Kines, NP

## 2023-05-18 NOTE — Assessment & Plan Note (Signed)
 Acute viral gastroenteritis with diarrhea, nausea, vomiting and fever. Denise abdominal pain.  - Will start of zofran  for nausea and vomiting, every 8 hours. - Advise hydration with electrolyte solutions like Pedialyte or Gatorade. - Instruct to take small sips of fluids and gradually increase as tolerated. - Advise against using anti-diarrheal medications like Imodium. - ED precaution provided if symptoms increases and oral intake remains inadequate.

## 2023-05-18 NOTE — Telephone Encounter (Signed)
 Noted.

## 2023-05-22 NOTE — Telephone Encounter (Signed)
 Noted

## 2023-05-23 ENCOUNTER — Encounter: Payer: Self-pay | Admitting: Orthopedic Surgery

## 2023-05-24 NOTE — Anesthesia Preprocedure Evaluation (Signed)
 Anesthesia Evaluation  Patient identified by MRN, date of birth, ID band Patient awake    Reviewed: Allergy & Precautions, H&P , NPO status , Patient's Chart, lab work & pertinent test results  Airway Mallampati: II  TM Distance: <3 FB Neck ROM: Full    Dental no notable dental hx.    Pulmonary neg pulmonary ROS   Pulmonary exam normal breath sounds clear to auscultation       Cardiovascular hypertension, Normal cardiovascular exam Rhythm:Regular Rate:Normal     Neuro/Psych  Headaches  Anxiety     negative neurological ROS  negative psych ROS   GI/Hepatic negative GI ROS, Neg liver ROS,GERD  ,,  Endo/Other  negative endocrine ROS    Renal/GU negative Renal ROS  negative genitourinary   Musculoskeletal negative musculoskeletal ROS (+)    Abdominal   Peds negative pediatric ROS (+)  Hematology negative hematology ROS (+)   Anesthesia Other Findings Scoliosis  Migraine Raynaud's disease Lumbar herniated disc  PVC's (premature ventricular contractions) GERD (gastroesophageal reflux disease)  Motion sickness (did not get scop patch for BTL) Chondral defect of left patella  States she "always" has "busted upper lip" after surgery.   Has decorative piece on right upper incisor.  States she "cannot get it off."    Reproductive/Obstetrics negative OB ROS                             Anesthesia Physical Anesthesia Plan  ASA: 2  Anesthesia Plan: General   Post-op Pain Management:    Induction: Intravenous  PONV Risk Score and Plan:   Airway Management Planned: LMA  Additional Equipment:   Intra-op Plan:   Post-operative Plan: Extubation in OR  Informed Consent: I have reviewed the patients History and Physical, chart, labs and discussed the procedure including the risks, benefits and alternatives for the proposed anesthesia with the patient or authorized representative who has  indicated his/her understanding and acceptance.     Dental Advisory Given  Plan Discussed with: Anesthesiologist, CRNA and Surgeon  Anesthesia Plan Comments: (Patient consented for risks of anesthesia including but not limited to:  - adverse reactions to medications - damage to eyes, teeth, lips or other oral mucosa - nerve damage due to positioning  - sore throat or hoarseness - Damage to heart, brain, nerves, lungs, other parts of body or loss of life  Patient voiced understanding and assent.)        Anesthesia Quick Evaluation

## 2023-06-02 ENCOUNTER — Ambulatory Visit: Admitting: Anesthesiology

## 2023-06-02 ENCOUNTER — Ambulatory Visit: Payer: Self-pay

## 2023-06-02 ENCOUNTER — Other Ambulatory Visit: Payer: Self-pay

## 2023-06-02 ENCOUNTER — Ambulatory Visit
Admission: RE | Admit: 2023-06-02 | Discharge: 2023-06-02 | Disposition: A | Discharge status: Home / Self Care | Attending: Orthopedic Surgery | Admitting: Orthopedic Surgery

## 2023-06-02 ENCOUNTER — Encounter: Payer: Self-pay | Admitting: Orthopedic Surgery

## 2023-06-02 ENCOUNTER — Encounter
Admission: RE | Disposition: A | Discharge status: Home / Self Care | Payer: Self-pay | Source: Home / Self Care | Attending: Orthopedic Surgery

## 2023-06-02 DIAGNOSIS — I10 Essential (primary) hypertension: Secondary | ICD-10-CM | POA: Insufficient documentation

## 2023-06-02 DIAGNOSIS — X58XXXA Exposure to other specified factors, initial encounter: Secondary | ICD-10-CM | POA: Insufficient documentation

## 2023-06-02 DIAGNOSIS — T8484XA Pain due to internal orthopedic prosthetic devices, implants and grafts, initial encounter: Secondary | ICD-10-CM | POA: Diagnosis not present

## 2023-06-02 DIAGNOSIS — K219 Gastro-esophageal reflux disease without esophagitis: Secondary | ICD-10-CM | POA: Diagnosis not present

## 2023-06-02 DIAGNOSIS — M238X2 Other internal derangements of left knee: Secondary | ICD-10-CM | POA: Diagnosis present

## 2023-06-02 HISTORY — PX: HARDWARE REMOVAL: SHX979

## 2023-06-02 LAB — POCT PREGNANCY, URINE: Preg Test, Ur: NEGATIVE

## 2023-06-02 SURGERY — REMOVAL, HARDWARE
Anesthesia: General | Site: Knee | Laterality: Left

## 2023-06-02 MED ORDER — BUPIVACAINE LIPOSOME 1.3 % IJ SUSP
INTRAMUSCULAR | Status: DC | PRN
  Administered 2023-06-02: 10 mL

## 2023-06-02 MED ORDER — DEXMEDETOMIDINE HCL IN NACL 200 MCG/50ML IV SOLN
INTRAVENOUS | Status: DC | PRN
  Administered 2023-06-02: 10 ug via INTRAVENOUS

## 2023-06-02 MED ORDER — ONDANSETRON HCL 4 MG/2ML IJ SOLN
4.0000 mg | Freq: Once | INTRAMUSCULAR | Status: AC
Start: 2023-06-02 — End: 2023-06-02
  Administered 2023-06-02: 4 mg via INTRAVENOUS

## 2023-06-02 MED ORDER — LIDOCAINE HCL (PF) 2 % IJ SOLN
INTRAMUSCULAR | Status: AC
  Filled 2023-06-02: qty 5

## 2023-06-02 MED ORDER — FENTANYL CITRATE (PF) 100 MCG/2ML IJ SOLN
INTRAMUSCULAR | Status: DC | PRN
  Administered 2023-06-02 (×2): 50 ug via INTRAVENOUS

## 2023-06-02 MED ORDER — LACTATED RINGERS IV SOLN: INTRAVENOUS | Status: DC

## 2023-06-02 MED ORDER — ACETAMINOPHEN 10 MG/ML IV SOLN
INTRAVENOUS | Status: AC
  Filled 2023-06-02: qty 100

## 2023-06-02 MED ORDER — OXYCODONE HCL 5 MG PO TABS
10.0000 mg | ORAL_TABLET | Freq: Once | ORAL | Status: AC
  Administered 2023-06-02: 10 mg via ORAL

## 2023-06-02 MED ORDER — CEFAZOLIN SODIUM-DEXTROSE 2-4 GM/100ML-% IV SOLN
2.0000 g | INTRAVENOUS | Status: AC
  Administered 2023-06-02: 2 g via INTRAVENOUS

## 2023-06-02 MED ORDER — KETOROLAC TROMETHAMINE 15 MG/ML IJ SOLN
INTRAMUSCULAR | Status: DC | PRN
  Administered 2023-06-02: 15 mg via INTRAVENOUS

## 2023-06-02 MED ORDER — SODIUM CHLORIDE 0.9 % IV SOLN
INTRAVENOUS | Status: DC | PRN
Start: 2023-06-02 — End: 2023-06-02

## 2023-06-02 MED ORDER — MIDAZOLAM HCL 2 MG/2ML IJ SOLN
INTRAMUSCULAR | Status: AC
Start: 2023-06-02 — End: ?
  Filled 2023-06-02: qty 2

## 2023-06-02 MED ORDER — ASPIRIN 325 MG PO TBEC: 325.0000 mg | DELAYED_RELEASE_TABLET | Freq: Every day | ORAL | 0 refills | Status: AC

## 2023-06-02 MED ORDER — PROPOFOL 10 MG/ML IV BOLUS
INTRAVENOUS | Status: AC
  Filled 2023-06-02: qty 20

## 2023-06-02 MED ORDER — FENTANYL CITRATE (PF) 100 MCG/2ML IJ SOLN
INTRAMUSCULAR | Status: AC
  Filled 2023-06-02: qty 2

## 2023-06-02 MED ORDER — DEXAMETHASONE SODIUM PHOSPHATE 10 MG/ML IJ SOLN
INTRAMUSCULAR | Status: DC | PRN
  Administered 2023-06-02: 8 mg via INTRAVENOUS

## 2023-06-02 MED ORDER — HYDROCODONE-ACETAMINOPHEN 5-325 MG PO TABS: 1.0000 | ORAL_TABLET | ORAL | 0 refills | Status: DC | PRN

## 2023-06-02 MED ORDER — ACETAMINOPHEN 10 MG/ML IV SOLN
INTRAVENOUS | Status: DC | PRN
  Administered 2023-06-02: 1000 mg via INTRAVENOUS

## 2023-06-02 MED ORDER — ONDANSETRON HCL 4 MG/2ML IJ SOLN
INTRAMUSCULAR | Status: AC
  Filled 2023-06-02: qty 2

## 2023-06-02 MED ORDER — PROPOFOL 10 MG/ML IV BOLUS
INTRAVENOUS | Status: DC | PRN
  Administered 2023-06-02: 200 mg via INTRAVENOUS
  Administered 2023-06-02: 50 ug/kg/min via INTRAVENOUS

## 2023-06-02 MED ORDER — KETOROLAC TROMETHAMINE 30 MG/ML IJ SOLN
INTRAMUSCULAR | Status: AC
  Filled 2023-06-02: qty 1

## 2023-06-02 MED ORDER — 0.9 % SODIUM CHLORIDE (POUR BTL) OPTIME
TOPICAL | Status: DC | PRN
  Administered 2023-06-02: 500 mL

## 2023-06-02 MED ORDER — LIDOCAINE HCL (CARDIAC) PF 100 MG/5ML IV SOSY
PREFILLED_SYRINGE | INTRAVENOUS | Status: DC | PRN
  Administered 2023-06-02: 100 mg via INTRATRACHEAL

## 2023-06-02 MED ORDER — MIDAZOLAM HCL 5 MG/5ML IJ SOLN
INTRAMUSCULAR | Status: DC | PRN
  Administered 2023-06-02: 2 mg via INTRAVENOUS

## 2023-06-02 MED ORDER — OXYCODONE HCL 5 MG PO TABS
ORAL_TABLET | ORAL | Status: AC
  Filled 2023-06-02: qty 2

## 2023-06-02 MED ORDER — BUPIVACAINE HCL (PF) 0.5 % IJ SOLN
INTRAMUSCULAR | Status: DC | PRN
  Administered 2023-06-02: 12 mL

## 2023-06-02 MED ORDER — ACETAMINOPHEN 500 MG PO TABS: 1000.0000 mg | ORAL_TABLET | Freq: Three times a day (TID) | ORAL | 2 refills | Status: DC

## 2023-06-02 MED ORDER — CEFAZOLIN SODIUM-DEXTROSE 2-3 GM-%(50ML) IV SOLR
INTRAVENOUS | Status: AC
  Filled 2023-06-02: qty 50

## 2023-06-02 MED ORDER — DEXAMETHASONE SODIUM PHOSPHATE 4 MG/ML IJ SOLN
INTRAMUSCULAR | Status: AC
  Filled 2023-06-02: qty 2

## 2023-06-02 MED ORDER — IBUPROFEN 800 MG PO TABS: 800.0000 mg | ORAL_TABLET | Freq: Three times a day (TID) | ORAL | 0 refills | Status: AC

## 2023-06-02 SURGICAL SUPPLY — 30 items
BLADE SURG 15 STRL LF DISP TIS (BLADE) ×2 IMPLANT
BNDG COHESIVE 4X5 TAN STRL LF (GAUZE/BANDAGES/DRESSINGS) ×1 IMPLANT
BNDG ELASTIC 4X5.8 VLCR NS LF (GAUZE/BANDAGES/DRESSINGS) ×1 IMPLANT
BNDG ELASTIC 6X5.8 VLCR NS LF (GAUZE/BANDAGES/DRESSINGS) ×1 IMPLANT
BNDG ESMARCH 4X12 STRL LF (GAUZE/BANDAGES/DRESSINGS) ×1 IMPLANT
CHLORAPREP W/TINT 26 (MISCELLANEOUS) ×1 IMPLANT
COVER LIGHT HANDLE UNIVERSAL (MISCELLANEOUS) IMPLANT
DERMABOND ADVANCED .7 DNX12 (GAUZE/BANDAGES/DRESSINGS) IMPLANT
DRAPE FLUOR MINI C-ARM 54X84 (DRAPES) ×1 IMPLANT
DRSG OPSITE POSTOP 3X4 (GAUZE/BANDAGES/DRESSINGS) IMPLANT
ELECTRODE REM PT RTRN 9FT ADLT (ELECTROSURGICAL) ×1 IMPLANT
GAUZE SPONGE 4X4 12PLY STRL (GAUZE/BANDAGES/DRESSINGS) ×1 IMPLANT
GAUZE XEROFORM 1X8 LF (GAUZE/BANDAGES/DRESSINGS) ×1 IMPLANT
GLOVE BIOGEL PI IND STRL 8 (GLOVE) ×1 IMPLANT
GLOVE SURG SYN 7.5 E (GLOVE) ×2 IMPLANT
GLOVE SURG SYN 7.5 PF PI (GLOVE) ×2 IMPLANT
GOWN STRL REIN 2XL XLG LVL4 (GOWN DISPOSABLE) ×1 IMPLANT
GOWN STRL REUS W/ TWL LRG LVL3 (GOWN DISPOSABLE) ×1 IMPLANT
KIT TURNOVER KIT A (KITS) ×1 IMPLANT
NS IRRIG 500ML POUR BTL (IV SOLUTION) ×1 IMPLANT
PACK EXTREMITY (MISCELLANEOUS) ×1 IMPLANT
PAD ABD DERMACEA PRESS 5X9 (GAUZE/BANDAGES/DRESSINGS) ×1 IMPLANT
STAPLER SKIN PROX 35W (STAPLE) ×1 IMPLANT
STOCKINETTE IMPERVIOUS LG (DRAPES) ×1 IMPLANT
SUT ETHILON 4 0 PS 2 18 (SUTURE) ×1 IMPLANT
SUT VIC AB 2-0 CT2 27 (SUTURE) ×1 IMPLANT
SUT VIC AB 3-0 SH 27X BRD (SUTURE) ×1 IMPLANT
SUTURE EHLN 3-0 FS-10 30 BLK (SUTURE) ×1 IMPLANT
SUTURE MNCRL 4-0 27XMF (SUTURE) IMPLANT
SYR 10ML LL (SYRINGE) IMPLANT

## 2023-06-02 NOTE — Discharge Instructions (Signed)
 Postoperative instructions - Hardware removal   Post-Op Instructions   1. Bracing or crutches: Crutches will be provided at the time of discharge from the surgery center if needed and you do not already have them.   2. Ice: You may be provided with a device [facility]) that allows you to ice the affected area effectively. Otherwise you can ice manually.    3. Driving:  Plan on not driving for at least 2 days. May drive when comfortable and off of narcotic medications.   4. Activity: Ankle pumps several times an hour while awake to prevent blood clots. Weight bearing: as tolerated. Use crutches for as needed (usually ~1-2 days or less) until pain allows you to ambulate without a limp. Bending and straightening the knee is unlimited. Elevate knee above heart level as much as possible for one week.  Avoid running and jumping type activities for at least 6 weeks.  May walk as tolerated.   5. Medications:  - You have been given a prescription of a narcotic medication (Norco/hydrocodone ). You should not need more than a few tablets of this, if you need any at all.  - You may take up to 3000mg /day of tylenol  (acetaminophen ). You can take 1000mg  3x/day. Please check your narcotic. If you have acetaminophen  in your narcotic (each tablet will be 325mg ), be careful not to exceed a total of 3000mg /day of acetaminophen .  - A prescription for anti-nausea medication will be provided in case the narcotic medicine causes nausea - take 1 tablet every 6 hours only if nauseated.  - Take ibuprofen  800 mg every 8 hours WITH food to help with pain and swelling - Take aspirin  325mg /day x 2 weeks to help prevent blood clots.   6. Bandages: May remove the surgical dressing in 3 days.  May shower after this.   7. Physical Therapy: Not needed   8. Post-Op Appointments: Your first post-op appointment will be with Dr. Lydia Sams in approximately 2 weeks time.    If you find that they have not been scheduled please call the  Orthopaedic Appointment front desk at [phone].

## 2023-06-02 NOTE — Anesthesia Postprocedure Evaluation (Signed)
 Anesthesia Post Note  Patient: Sydney James  Procedure(s) Performed: REMOVAL, HARDWARE (Left: Knee)  Patient location during evaluation: PACU Anesthesia Type: General Level of consciousness: awake and alert Pain management: pain level controlled Vital Signs Assessment: post-procedure vital signs reviewed and stable Respiratory status: spontaneous breathing, nonlabored ventilation, respiratory function stable and patient connected to nasal cannula oxygen Cardiovascular status: blood pressure returned to baseline and stable Postop Assessment: no apparent nausea or vomiting Anesthetic complications: no Comments: Note: upper lip is intact, and so is jewelry on right upper incisor. Patient reports happy that lip is intact and jewelry intact.    No notable events documented.   Last Vitals:  Vitals:   06/02/23 0856 06/02/23 0900  BP:  127/84  Pulse:  65  Resp:  13  Temp:    SpO2: 100% 96%    Last Pain:  Vitals:   06/02/23 0858  TempSrc:   PainSc: 4                  Taheera Thomann C Keirah Konitzer

## 2023-06-02 NOTE — H&P (Signed)
 Paper H&P to be scanned into permanent record. H&P reviewed. No significant changes noted.

## 2023-06-02 NOTE — Anesthesia Procedure Notes (Signed)
 Procedure Name: LMA Insertion Date/Time: 06/02/2023 7:41 AM  Performed by: Francenia Ingle, CRNAPre-anesthesia Checklist: Patient identified, Emergency Drugs available, Suction available and Patient being monitored Patient Re-evaluated:Patient Re-evaluated prior to induction Oxygen Delivery Method: Circle system utilized Preoxygenation: Pre-oxygenation with 100% oxygen Induction Type: IV induction Ventilation: Mask ventilation without difficulty LMA: LMA inserted LMA Size: 4.0 Number of attempts: 1 Placement Confirmation: positive ETCO2 and breath sounds checked- equal and bilateral Tube secured with: Tape Dental Injury: Teeth and Oropharynx as per pre-operative assessment

## 2023-06-02 NOTE — Transfer of Care (Signed)
 Immediate Anesthesia Transfer of Care Note  Patient: Sydney James  Procedure(s) Performed: REMOVAL, HARDWARE (Left: Knee)  Patient Location: PACU  Anesthesia Type: General  Level of Consciousness: awake, alert  and patient cooperative  Airway and Oxygen Therapy: Patient Spontanous Breathing and Patient connected to supplemental oxygen  Post-op Assessment: Post-op Vital signs reviewed, Patient's Cardiovascular Status Stable, Respiratory Function Stable, Patent Airway and No signs of Nausea or vomiting  Post-op Vital Signs: Reviewed and stable  Complications: No notable events documented.

## 2023-06-02 NOTE — Op Note (Signed)
 Operative Note    SURGERY DATE: 06/02/2023   PRE-OP DIAGNOSIS:  1. Left tibia painful retained hardware   POST-OP DIAGNOSIS:  1. Left tibia painful retained hardware   PROCEDURES:  1. Left tibia removal of deep hardware   SURGEON: Cleotilde Dago, MD   ANESTHESIA: Gen   ESTIMATED BLOOD LOSS: 5cc   TOTAL IV FLUIDS: see anesthesia record   INDICATION(S): Sydney James is a 28 y.o. female who initially underwent L patella and trochlea MACI  with tibial tubercle osteotomy by me in June 2024.  She recovered well from this surgery and had returned to all activities she wished to perform, but is now having increased pain directly over the tibial tubercle osteotomy fixation screws.    OPERATIVE REPORT:   I identified Sydney James in the pre-operative holding area. Informed consent was obtained and the surgical site was marked. I reviewed the risks and benefits of the proposed surgical intervention and the patient wished to proceed. The patient was transferred to the operative suite and spinal anesthesia was administered. The patient was transferred to the operating room table and placed in a supine position. All down side pressure points were appropriately padded. Appropriate IV antibiotics were administered within 30 minutes before incision. The extremity was then prepped and draped in standard fashion. A time out was performed confirming the correct extremity, correct patient, and correct procedure.    Fluoroscopy was used to localize the incision and verify screw position. Utilizing the prior incision, an ~5cm longitudinal incision overlying the tibial tubercle osteotomy screws was made. Dissection was carried down over the screws themselves as they were palpable.  Bovie electrocautery was used to remove any soft tissue from the screw heads.  All three Synthes 4.5 mm cortical screws were removed from the tibia using the appropriate screwdriver.  Complete removal of hardware was confirmed  with fluoroscopy.  Curette was used to debride the screw tracts.  Hemostasis was achieved with Bovie electrocautery.  Local anesthetic was injected about the incision.     The wound was copiously irrigated.  Subdermal layer was closed with buried 2-0 Vicryl and skin was closed with 4-0 Monocryl and Dermabond.  Sterile soft dressing was applied.  The patient was awakened and transferred to a stretcher bed and to the post anesthesia care unit in stable condition.    POSTOPERATIVE PLAN: Weightbearing as tolerated on operative extremity. ASA 325mg /day x 2 weeks for DVT ppx. Patient to return to clinic in ~2 weeks for post-operative appointment.

## 2023-07-13 ENCOUNTER — Other Ambulatory Visit (HOSPITAL_COMMUNITY)
Admission: RE | Admit: 2023-07-13 | Discharge: 2023-07-13 | Disposition: A | Discharge status: Home / Self Care | Source: Ambulatory Visit | Attending: Certified Nurse Midwife | Admitting: Certified Nurse Midwife

## 2023-07-13 ENCOUNTER — Ambulatory Visit

## 2023-07-13 VITALS — BP 156/104 | HR 67 | Ht 65.5 in | Wt 204.1 lb

## 2023-07-13 DIAGNOSIS — Z113 Encounter for screening for infections with a predominantly sexual mode of transmission: Secondary | ICD-10-CM | POA: Diagnosis present

## 2023-07-13 DIAGNOSIS — R350 Frequency of micturition: Secondary | ICD-10-CM

## 2023-07-13 DIAGNOSIS — N898 Other specified noninflammatory disorders of vagina: Secondary | ICD-10-CM

## 2023-07-13 LAB — POCT URINALYSIS DIPSTICK
Bilirubin, UA: NEGATIVE
Blood, UA: NEGATIVE
Glucose, UA: NEGATIVE
Ketones, UA: NEGATIVE
Leukocytes, UA: NEGATIVE
Nitrite, UA: NEGATIVE
Protein, UA: NEGATIVE
Spec Grav, UA: 1.015 (ref 1.010–1.025)
Urobilinogen, UA: 0.2 U/dL
pH, UA: 6 (ref 5.0–8.0)

## 2023-07-13 NOTE — Progress Notes (Signed)
    NURSE VISIT NOTE  Subjective:    Patient ID: DEIJA BUHRMAN, female    DOB: 11-Jul-1995, 28 y.o.   MRN: [id number]       HPI  Patient is a 28 y.o. [phone] female who presents for dysuria, urinary frequency, and cloudy malordorous urine, vaginal irritation for 4 days.  Patient denies hematuria, abdominal pain, pelvic pain, genital rash, and vaginal discharge.  Patient does not have a history of recurrent UTI.  Patient does not have a history of pyelonephritis.    Objective:    BP (!) 156/104   Pulse 67   Ht 5' 5.5 (1.664 m)   Wt 204 lb 1.6 oz (92.6 kg)   LMP 06/22/2023 (Exact Date)   BMI 33.45 kg/m    Lab Review  Results for orders placed or performed in visit on 07/13/23  POCT urinalysis dipstick  Result Value Ref Range   Color, UA yellow    Clarity, UA clear    Glucose, UA Negative Negative   Bilirubin, UA negative    Ketones, UA negative    Spec Grav, UA 1.015 1.010 - 1.025   Blood, UA negative    pH, UA 6.0 5.0 - 8.0   Protein, UA Negative Negative   Urobilinogen, UA 0.2 0.2 or 1.0 E.U./dL   Nitrite, UA negative    Leukocytes, UA Negative Negative   Appearance     Odor      Assessment:   1. Urinary frequency   2. Vaginal irritation   3. Screening examination for STI      Plan:   Urine Culture Sent. Maintain adequate hydration.  May use AZO OTC prn.  Follow up if symptoms worsen or fail to improve as anticipated, and as needed.    Rollo JINNY Maxin, CMA

## 2023-07-15 LAB — URINE CULTURE

## 2023-07-17 ENCOUNTER — Other Ambulatory Visit: Payer: Self-pay

## 2023-07-17 DIAGNOSIS — N3000 Acute cystitis without hematuria: Secondary | ICD-10-CM

## 2023-07-17 LAB — CERVICOVAGINAL ANCILLARY ONLY
Bacterial Vaginitis (gardnerella): POSITIVE — AB
Candida Glabrata: NEGATIVE
Candida Vaginitis: NEGATIVE
Chlamydia: NEGATIVE
Comment: NEGATIVE
Comment: NEGATIVE
Comment: NEGATIVE
Comment: NEGATIVE
Comment: NEGATIVE
Comment: NORMAL
Neisseria Gonorrhea: NEGATIVE
Trichomonas: NEGATIVE

## 2023-07-17 MED ORDER — NITROFURANTOIN MONOHYD MACRO 100 MG PO CAPS: 100.0000 mg | ORAL_CAPSULE | Freq: Two times a day (BID) | ORAL | 0 refills | Status: DC

## 2023-08-31 ENCOUNTER — Ambulatory Visit (INDEPENDENT_AMBULATORY_CARE_PROVIDER_SITE_OTHER): Admitting: Licensed Practical Nurse

## 2023-08-31 ENCOUNTER — Encounter: Payer: Self-pay | Admitting: Licensed Practical Nurse

## 2023-08-31 ENCOUNTER — Other Ambulatory Visit: Payer: Self-pay | Admitting: Licensed Practical Nurse

## 2023-08-31 ENCOUNTER — Other Ambulatory Visit (HOSPITAL_COMMUNITY)
Admission: RE | Admit: 2023-08-31 | Discharge: 2023-08-31 | Disposition: A | Discharge status: Home / Self Care | Source: Ambulatory Visit | Attending: Licensed Practical Nurse | Admitting: Licensed Practical Nurse

## 2023-08-31 VITALS — BP 145/110 | HR 66 | Wt 204.5 lb

## 2023-08-31 DIAGNOSIS — L209 Atopic dermatitis, unspecified: Secondary | ICD-10-CM | POA: Diagnosis not present

## 2023-08-31 DIAGNOSIS — Z113 Encounter for screening for infections with a predominantly sexual mode of transmission: Secondary | ICD-10-CM

## 2023-08-31 MED ORDER — NYSTATIN-TRIAMCINOLONE 100000-0.1 UNIT/GM-% EX OINT: 1.0000 | TOPICAL_OINTMENT | Freq: Two times a day (BID) | CUTANEOUS | 0 refills | Status: AC

## 2023-08-31 NOTE — Progress Notes (Unsigned)
 Glendia Shad, MD   No chief complaint on file.   HPI:      Sydney James is a 28 y.o. [phone] whose LMP was No LMP recorded., presents today for hives on her labia.   Sydney James has had an intense itching and pain on her labia since yesterday around 2 or 3 pm. She believes there are hives. She has not had any changes to her soaps/detergents. She did try on a new pair of bike shorts, but was wearing underwear. She did do some gardening yesterday while wearing jean shorts, she does not think she sat in anything that could have caused this. She is sexually active, no changes to their sexually routine, she has sensitive skin so is particular about lubes/toys. Denies hx of HSV for her or her partner.   She has used an OTC hydrocortisone cream which is helping some.     Patient Active Problem List   Diagnosis Date Noted   Viral gastroenteritis 05/18/2023   Decreased appetite 09/27/2022   Dysmenorrhea 08/22/2022   UTI (urinary tract infection) 08/20/2022   Healthcare maintenance 08/01/2022   Menorrhagia 08/01/2022   Chondral defect of patella 06/07/2022   Tonsillitis 04/17/2022   Anxiety 02/13/2022   Chondral defect of left patella 12/14/2021   Menstrual changes 11/15/2021   Knee pain, bilateral 09/27/2021   Weight gain 09/08/2021   Dizziness 09/08/2021   Fetal demise due to miscarriage 07/13/2021   DDD (degenerative disc disease), lumbar 11/30/2019   History of migraine headaches 11/30/2019   Essential hypertension 09/05/2019   Sleep disturbance 09/05/2019   Obesity 02/27/2019   Vitamin D  deficiency 11/02/2016    Past Surgical History:  Procedure Laterality Date   CESAREAN SECTION N/A 12/22/2017   Procedure: CESAREAN SECTION;  Surgeon: Janit Alm Agent, MD;  Location: [facility];  Service: Obstetrics;  Laterality: N/A;  Female Born @ [phone]     DILATION AND EVACUATION N/A 03/15/2016   Procedure: DILATATION AND EVACUATION;  Surgeon: Gladis DELENA Dollar, MD;  Location:  [facility];  Service: Gynecology;  Laterality: N/A;   DILATION AND EVACUATION N/A 07/18/2019   Procedure: DILATATION AND EVACUATION;  Surgeon: Connell Davies, MD;  Location: [facility];  Service: Gynecology;  Laterality: N/A;   HARDWARE REMOVAL Left 06/02/2023   Procedure: REMOVAL, HARDWARE;  Surgeon: Tobie Priest, MD;  Location: [facility];  Service: Orthopedics;  Laterality: Left;  Left tibia removal of hardware   KNEE ARTHROSCOPY Left 12/13/2021   Procedure: Left knee arthroscopy and chondroplasty with cartilage biopsy;  Surgeon: Tobie Priest, MD;  Location: [facility];  Service: Orthopedics;  Laterality: Left;   KNEE SURGERY Left 06/07/2022   patella and trochlea matriz autologous chondrocyte implantation, tibial tubercle osteotomy   LAPAROSCOPIC TUBAL LIGATION N/A 12/23/2022   Procedure: LAPAROSCOPIC TUBAL LIGATION WITH FILSHIE CLIPS;  Surgeon: Janit Alm Agent, MD;  Location: [facility];  Service: Gynecology;  Laterality: N/A;   WISDOM TOOTH EXTRACTION      Family History  Problem Relation Age of Onset   Hypertension Father    Hypertension Mother    Early death Mother    Cancer Maternal Grandmother    Cancer Maternal Grandfather    Diabetes Maternal Grandfather    Cancer Paternal Grandmother    Diabetes Paternal Grandfather     Social History   Socioeconomic History   Marital status: Married    Spouse name: Sydney James   Number of children: 3   Years of education: Not on file  Highest education level: Not on file  Occupational History   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  Tobacco Use   Smoking status: Never   Smokeless tobacco: Never  Vaping Use   Vaping status: Never Used  Substance and Sexual Activity   Alcohol use: No   Drug use: No   Sexual activity: Yes    Birth control/protection: Surgical    Comment: BTL 12/23/2022  Other Topics Concern   Not on file  Social History Narrative   Not on file   Social Drivers of Health   Financial Resource Strain:  Low Risk  (07/12/2023)   Received from [facility]   Overall Financial Resource Strain (CARDIA)    Difficulty of Paying Living Expenses: Not hard at all  Food Insecurity: No Food Insecurity (07/12/2023)   Received from [facility]   Hunger Vital Sign    Within the past 12 months, you worried that your food would run out before you got the money to buy more.: Never true    Within the past 12 months, the food you bought just didn't last and you didn't have money to get more.: Never true  Transportation Needs: No Transportation Needs (07/12/2023)   Received from [facility] - Transportation    In the past 12 months, has lack of transportation kept you from medical appointments or from getting medications?: No    Lack of Transportation (Non-Medical): No  Physical Activity: Inactive (12/19/2017)   Exercise Vital Sign    Days of Exercise per Week: 0 days    Minutes of Exercise per Session: 0 min  Stress: No Stress Concern Present (12/19/2017)   [employer] of Occupational Health - Occupational Stress Questionnaire    Feeling of Stress : Not at all  Social Connections: Moderately Integrated (12/19/2017)   Social Connection and Isolation Panel    Frequency of Communication with Friends and Family: More than three times a week    Frequency of Social Gatherings with Friends and Family: More than three times a week    Attends Religious Services: Never    Database administrator or Organizations: Yes    Attends Engineer, structural: More than 4 times per year    Marital Status: Married  Catering manager Violence: Not At Risk (06/07/2022)   Humiliation, Afraid, Rape, and Kick questionnaire    Fear of Current or Ex-Partner: No    Emotionally Abused: No    Physically Abused: No    Sexually Abused: No    Outpatient Medications Prior to Visit  Medication Sig Dispense Refill   Cholecalciferol  25 MCG (1000 UT) capsule Take 1  capsule (1,000 Units total) by mouth daily. 90 capsule 3   acetaminophen  (TYLENOL ) 500 MG tablet Take 2 tablets (1,000 mg total) by mouth every 8 (eight) hours. 90 tablet 2   HYDROcodone -acetaminophen  (NORCO/VICODIN) 5-325 MG tablet Take 1-2 tablets by mouth every 4 (four) hours as needed for moderate pain (pain score 4-6). 5 tablet 0   nitrofurantoin , macrocrystal-monohydrate, (MACROBID ) 100 MG capsule Take 1 capsule (100 mg total) by mouth 2 (two) times daily. 14 capsule 0   ondansetron  (ZOFRAN ) 4 MG tablet Take 1 tablet (4 mg total) by mouth every 8 (eight) hours as needed for nausea or vomiting. 20 tablet 0   No facility-administered medications prior to visit.      ROS:  Review of Systems see HPI   OBJECTIVE:   Vitals:  BP (!) 145/110 (BP Location: Left  Arm, Patient Position: Sitting, Cuff Size: Normal)   Pulse 66   Wt 204 lb 8 oz (92.8 kg)   BMI 33.51 kg/m   Physical Exam Constitutional:      Appearance: Normal appearance.  Cardiovascular:     Rate and Rhythm: Normal rate.  Pulmonary:     Effort: Pulmonary effort is normal.  Genitourinary:    Comments: Lower portion of vulva red slightly swollen with multiple open  lesions red/pink in color, non tender when touched with a cotton swab  SSE: cervix pink no lesions, small amount of physiologic discharge present.  Neurological:     Mental Status: She is alert.  Psychiatric:        Mood and Affect: Mood normal.        Thought Content: Thought content normal.     Results: No results found for this or any previous visit (from the past 24 hours).   Assessment/Plan: Atopic dermatitis, unspecified type - Plan: nystatin -triamcinolone  ointment (MYCOLOG)  Screening examination for STD (sexually transmitted disease) - Plan: Herpes simplex virus culture, Cervicovaginal ancillary only    Meds ordered this encounter  Medications   nystatin -triamcinolone  ointment (MYCOLOG)    Sig: Apply 1 Application topically 2 (two)  times daily. Use for 7 days    Dispense:  30 g    Refill:  0   The lesions do not appear to be herpes, not ulcerated or painful but will swab, offered serum HSV-pt declined   Most likely atopic dermatitis or could have been bitten by an insect while gardening   Will try Mycoolog, if symptoms do not improve in 2 weeks please return to the clinic  JINNIE HERO [facility], CNM 08/31/2023 9:39 PM

## 2023-09-04 LAB — HERPES SIMPLEX VIRUS CULTURE

## 2023-09-05 ENCOUNTER — Other Ambulatory Visit: Payer: Self-pay | Admitting: Licensed Practical Nurse

## 2023-09-05 DIAGNOSIS — B009 Herpesviral infection, unspecified: Secondary | ICD-10-CM | POA: Insufficient documentation

## 2023-09-05 LAB — CERVICOVAGINAL ANCILLARY ONLY
Bacterial Vaginitis (gardnerella): POSITIVE — AB
Candida Glabrata: NEGATIVE
Candida Vaginitis: NEGATIVE
Chlamydia: NEGATIVE
Comment: NEGATIVE
Comment: NEGATIVE
Comment: NEGATIVE
Comment: NEGATIVE
Comment: NEGATIVE
Comment: NORMAL
Neisseria Gonorrhea: NEGATIVE
Trichomonas: NEGATIVE

## 2023-09-05 MED ORDER — VALACYCLOVIR HCL 1 G PO TABS
1000.0000 mg | ORAL_TABLET | Freq: Every day | ORAL | 2 refills | Status: AC
Start: 2023-09-05 — End: ?

## 2023-09-06 ENCOUNTER — Other Ambulatory Visit: Payer: Self-pay | Admitting: Licensed Practical Nurse

## 2023-09-06 ENCOUNTER — Ambulatory Visit: Payer: Self-pay | Admitting: Licensed Practical Nurse

## 2023-09-06 DIAGNOSIS — N760 Acute vaginitis: Secondary | ICD-10-CM

## 2023-09-06 MED ORDER — METRONIDAZOLE 500 MG PO TABS: 500.0000 mg | ORAL_TABLET | Freq: Two times a day (BID) | ORAL | 0 refills | Status: DC

## 2023-09-08 ENCOUNTER — Ambulatory Visit: Payer: Self-pay | Admitting: Licensed Practical Nurse

## 2023-09-08 ENCOUNTER — Other Ambulatory Visit: Payer: Self-pay | Admitting: Licensed Practical Nurse

## 2023-09-08 DIAGNOSIS — B9689 Other specified bacterial agents as the cause of diseases classified elsewhere: Secondary | ICD-10-CM

## 2023-09-08 NOTE — Telephone Encounter (Signed)
 Rx switched to [facility] (pharmacy sent request, pt confirmed)

## 2023-11-09 ENCOUNTER — Other Ambulatory Visit (HOSPITAL_COMMUNITY)
Admission: RE | Admit: 2023-11-09 | Discharge: 2023-11-09 | Disposition: A | Discharge status: Home / Self Care | Source: Ambulatory Visit | Attending: Certified Nurse Midwife | Admitting: Certified Nurse Midwife

## 2023-11-09 ENCOUNTER — Ambulatory Visit (INDEPENDENT_AMBULATORY_CARE_PROVIDER_SITE_OTHER)

## 2023-11-09 VITALS — BP 156/95 | HR 79 | Wt 210.9 lb

## 2023-11-09 DIAGNOSIS — N949 Unspecified condition associated with female genital organs and menstrual cycle: Secondary | ICD-10-CM

## 2023-11-09 NOTE — Progress Notes (Signed)
    NURSE VISIT NOTE  Subjective:    Patient ID: EDIE VALLANDINGHAM, female    DOB: 1995-12-25, 28 y.o.   MRN: [id number]  HPI  Patient is a 28 y.o. [id number] female who presents for white vaginal discharge for 2-3 day(s). Admits abnormal significant pelvic pain or fever. denies dysuria. Patient has a history of known exposure to STD.   Objective:    BP (!) 156/95   Pulse 79   Wt 210 lb 14.4 oz (95.7 kg)   BMI 34.56 kg/m    No results found for any visits on 11/09/23.  Assessment:   1. Vaginal discomfort     herpes genitalis  Plan:   GC and chlamydia DNA  probe sent to lab. ROV prn if symptoms persist or worsen.   Mathis LITTIE Getting, CMA

## 2023-11-13 ENCOUNTER — Other Ambulatory Visit: Payer: Self-pay | Admitting: Certified Nurse Midwife

## 2023-11-13 ENCOUNTER — Encounter: Payer: Self-pay | Admitting: Certified Nurse Midwife

## 2023-11-13 DIAGNOSIS — B9689 Other specified bacterial agents as the cause of diseases classified elsewhere: Secondary | ICD-10-CM

## 2023-11-13 LAB — CERVICOVAGINAL ANCILLARY ONLY
Bacterial Vaginitis (gardnerella): POSITIVE — AB
Candida Glabrata: NEGATIVE
Candida Vaginitis: POSITIVE — AB
Chlamydia: NEGATIVE
Comment: NEGATIVE
Comment: NEGATIVE
Comment: NEGATIVE
Comment: NEGATIVE
Comment: NEGATIVE
Comment: NORMAL
Neisseria Gonorrhea: NEGATIVE
Trichomonas: NEGATIVE

## 2023-11-13 MED ORDER — FLUCONAZOLE 150 MG PO TABS: 150.0000 mg | ORAL_TABLET | Freq: Once | ORAL | 0 refills | Status: AC

## 2023-11-13 MED ORDER — METRONIDAZOLE 500 MG PO TABS
500.0000 mg | ORAL_TABLET | Freq: Two times a day (BID) | ORAL | 0 refills | Status: AC
Start: 2023-11-13 — End: 2023-11-20

## 2023-11-23 ENCOUNTER — Ambulatory Visit: Payer: Self-pay

## 2023-11-23 ENCOUNTER — Encounter: Payer: Self-pay | Admitting: Internal Medicine

## 2023-11-23 NOTE — Telephone Encounter (Signed)
 FYI Only or Action Required?: FYI only for provider: appointment scheduled on 11/26.  Patient was last seen in primary care on 05/18/2023 by Vincente Saber, NP.  Called Nurse Triage reporting Hypertension.  Symptoms began A few months ago.  Interventions attempted: Nothing.  Symptoms are: gradually worsening.  Triage Disposition: See PCP Within 2 Weeks  Patient/caregiver understands and will follow disposition?: Yes     Copied from CRM [id number]. Topic: Clinical - Red Word Triage >> Nov 23, 2023  3:10 PM Rea ORN wrote: Red Word that prompted transfer to Nurse Triage: High blood pressure, blurry vision at times and headaches. BP 158/98 today but pt stated diastolic gets well over 100 with exertion.   Pt is 1.5 hours away and requesting virtual visit.        Reason for Disposition  [1] Systolic BP >= 130 OR Diastolic >= 80 AND [2] not taking BP medications  Answer Assessment - Initial Assessment Questions 1. BLOOD PRESSURE: What is your blood pressure? Did you take at least two measurements 5 minutes apart?     158/98 2. ONSET: When did you take your blood pressure?     Today, has been elevated for the last month or two  3. HOW: How did you take your blood pressure? (e.g., automatic home BP monitor, visiting nurse)     Automatic BP cuff  4. HISTORY: Do you have a history of high blood pressure?     Yes 5. MEDICINES: Are you taking any medicines for blood pressure? Have you missed any doses recently?     Not on medication  6. OTHER SYMPTOMS: Do you have any symptoms? (e.g., blurred vision, chest pain, difficulty breathing, headache, weakness)     Intermittent headaches and blurred vision  Protocols used: Blood Pressure - High-A-AH

## 2023-11-23 NOTE — Telephone Encounter (Signed)
 If she is having blurred vision, headache and increased blood pressure needs to be seen now. I don't want her waiting until 11/29/23.

## 2023-11-24 ENCOUNTER — Ambulatory Visit: Payer: Self-pay | Admitting: *Deleted

## 2023-11-24 ENCOUNTER — Emergency Department: Admission: EM | Admit: 2023-11-24 | Discharge: 2023-11-24 | Disposition: A | Discharge status: Home / Self Care

## 2023-11-24 ENCOUNTER — Emergency Department

## 2023-11-24 ENCOUNTER — Other Ambulatory Visit: Payer: Self-pay

## 2023-11-24 DIAGNOSIS — R079 Chest pain, unspecified: Secondary | ICD-10-CM | POA: Diagnosis present

## 2023-11-24 DIAGNOSIS — I10 Essential (primary) hypertension: Secondary | ICD-10-CM | POA: Insufficient documentation

## 2023-11-24 LAB — BASIC METABOLIC PANEL WITH GFR
Anion gap: 11 (ref 5–15)
BUN: 10 mg/dL (ref 6–20)
CO2: 22 mmol/L (ref 22–32)
Calcium: 9 mg/dL (ref 8.9–10.3)
Chloride: 103 mmol/L (ref 98–111)
Creatinine, Ser: 0.7 mg/dL (ref 0.44–1.00)
GFR, Estimated: >60 mL/min (ref >60)
Glucose, Bld: 91 mg/dL (ref 70–99)
Potassium: 3.8 mmol/L (ref 3.5–5.1)
Sodium: 135 mmol/L (ref 135–145)

## 2023-11-24 LAB — CBC
HCT: 43.3 % (ref 36.0–46.0)
Hemoglobin: 14.4 g/dL (ref 12.0–15.0)
MCH: 28.2 pg (ref 26.0–34.0)
MCHC: 33.3 g/dL (ref 30.0–36.0)
MCV: 84.9 fL (ref 80.0–100.0)
Platelets: 288 K/uL (ref 150–400)
RBC: 5.1 MIL/uL (ref 3.87–5.11)
RDW: 12.6 % (ref 11.5–15.5)
WBC: 8.1 K/uL (ref 4.0–10.5)
nRBC: 0 % (ref 0.0–0.2)

## 2023-11-24 LAB — POC URINE PREG, ED
Preg Test, Ur: NEGATIVE
Preg Test, Ur: NEGATIVE

## 2023-11-24 LAB — TROPONIN T, HIGH SENSITIVITY: Troponin T High Sensitivity: <15 ng/L (ref 0–19)

## 2023-11-24 MED ORDER — AMLODIPINE BESYLATE 5 MG PO TABS: 5.0000 mg | ORAL_TABLET | Freq: Every day | ORAL | 0 refills | Status: AC

## 2023-11-24 NOTE — Telephone Encounter (Signed)
 Spoke to pt. Pt is on the way to [facility].

## 2023-11-24 NOTE — ED Triage Notes (Signed)
 Pt comes in via pov with complaints of hypertension. Pt has been tracking her blood pressure at home, and has noticed that her pressures have been trending up. PT called her primary care to report her pressures, and was told to come to the ER. Pt complains of chest tightness and a headache at this time. Pt complains of pain 5/10. Pt is alert and oriented x4, with no signs of acute distress at this time.

## 2023-11-24 NOTE — Telephone Encounter (Signed)
 Please call and confirm she was evaluated and doing ok.

## 2023-11-24 NOTE — Telephone Encounter (Signed)
 See triage below-pt going to ED now

## 2023-11-24 NOTE — Telephone Encounter (Signed)
 Per conversation with patient yesterday:  Norine Skelton, CMA to Sydney Shad, MD      11/23/23  4:09 PM Spoke with pt. Pt is going to go to urgent care.called  [facility] on [address] in [place]. Pt will also keep follow up appt on 11/26

## 2023-11-24 NOTE — Telephone Encounter (Addendum)
 Scheduled to see Dr Onesimo on 11/24 & has called in with additional sx's. See other note. Pt going to ED now.

## 2023-11-24 NOTE — Discharge Instructions (Signed)
 1) take your Norvasc  as prescribed and follow-up with your primary care physician as already planned for next week. 2) do not drive or operate any machinery for at least 3 hours for the first week of taking your Norvasc  as this may make you lightheaded. 3) return with any acutely worsening symptoms or any other emergency. -- RETURN PRECAUTIONS & AFTERCARE: (ENGLISH) RETURN PRECAUTIONS: Return immediately to the emergency department or see/call your doctor if you feel worse, weak or have changes in speech or vision, are short of breath, have fever, vomiting, pain, bleeding or dark stool, trouble urinating or any new issues. Return here or see/call your doctor if not improving as expected for your suspected condition. FOLLOW-UP CARE: Call your doctor and/or any doctors we referred you to for more advice and to make an appointment. Do this today, tomorrow or after the weekend. Some doctors only take PPO insurance so if you have HMO insurance you may want to contact your HMO or your regular doctor for referral to a specialist within your plan. Either way tell the doctor's office that it was a referral from the emergency department so you get the soonest possible appointment.  YOUR TEST RESULTS: Take result reports of any blood or urine tests, imaging tests and EKG's to your doctor and any referral doctor. Have any abnormal tests repeated. Your doctor or a referral doctor can let you know when this should be done. Also make sure your doctor contacts this hospital to get any test results that are not currently available such as cultures or special tests for infection and final imaging reports, which are often not available at the time you leave the ER but which may list additional important findings that are not documented on the preliminary report. BLOOD PRESSURE: If your blood pressure was greater than 120/80 have your blood pressure rechecked within 1 to 2 weeks. MEDICATION SIDE EFFECTS: Do not drive, walk,  bike, take the bus, etc. if you have received or are being prescribed any sedating medications such as those for pain or anxiety or certain antihistamines like Benadryl . If you have been give one of these here get a taxi home or have a friend drive you home. Ask your pharmacist to counsel you on potential side effects of any new medication

## 2023-11-24 NOTE — Telephone Encounter (Signed)
 With symptoms progression and current symptoms - agree with evaluation now.

## 2023-11-24 NOTE — Telephone Encounter (Signed)
 FYI Only or Action Required?: FYI only for provider: ED advised.  Patient was last seen in primary care on 05/18/2023 by Vincente Saber, NP.  Called Nurse Triage reporting Hypertension.  Symptoms began yesterday.  Interventions attempted: Rest, hydration, or home remedies.  Symptoms are: rapidly worsening.  Triage Disposition: Go to ED Now (Notify PCP)  Patient/caregiver understands and will follow disposition?: Yes             Copied from CRM #[id number]. Topic: Clinical - Red Word Triage >> Nov 24, 2023  8:53 AM Sydney James wrote: [name] that prompted transfer to Nurse Triage: pt sent my chart message and was told to go to UC and pt wasn't given anything pt wants to know if she should go to ER.    My chart message;    11/23/23 12:58 PM Hi!  I've been tracking my blood pressure for a while now and it's staying consistently high and I cannot get it to come down with diet and exercise and it's always between 150s over 90s at a resting rate and then will skyrocket to 160s over 100s when I get stressed or do any sort of exercise.  I know this has been an ongoing problem for a while now do I need to make an appointment to get any sort of blood pressure medication?  Thanks!! Reason for Disposition  [1] Systolic BP >= 160 OR Diastolic >= 100 AND [2] cardiac (e.g., breathing difficulty, chest pain) or neurologic symptoms (e.g., new-onset blurred or double vision, unsteady gait)  Answer Assessment - Initial Assessment Questions Recommended ED now with someone to drive her, for evaluation due to sx chest tightness, SOB elevated BP with any mobility. Reports with more exertion like going to faint, fall out. See stars at times. Recommended if worsening sx call 911.       1. BLOOD PRESSURE: What is your blood pressure? Did you take at least two measurements 5 minutes apart?     Patient did not have monitor with her at time of call 2. ONSET: When did you take your blood  pressure?     Last night, UC- BP 160's/112. Recheck manual 150's/90. 3. HOW: How did you take your blood pressure? (e.g., automatic home BP monitor, visiting nurse)     Checked at UC  4. HISTORY: Do you have a history of high blood pressure?     Na  5. MEDICINES: Are you taking any medicines for blood pressure? Have you missed any doses recently?     no 6. OTHER SYMPTOMS: Do you have any symptoms? (e.g., blurred vision, chest pain, difficulty breathing, headache, weakness)     Constant headache x 1 month, blurred vision comes and goes, chest tightness with mobility and SOB with mobility. Elevated BPs.  7. PREGNANCY: Is there any chance you are pregnant? When was your last menstrual period?     na  Protocols used: Blood Pressure - High-A-AH

## 2023-11-24 NOTE — ED Provider Notes (Signed)
 [facility] Provider Note    Event Date/Time   First MD Initiated Contact with Patient 11/24/23 1451     (approximate)   History   Chest Pain   HPI  Sydney James is a 28 y.o. female with a past medical history of gestational hypertension that has intermittently carried over to when she has not been pregnant who presents with 1 week of worsening chest tightness headache and elevated blood pressures.  Patient reports that symptoms occur when her blood pressure has been high and her blood pressure at home has been trending greater than systolic 180s recently.  She states that she is not currently on any blood pressure medication.  She has been on multiple blood pressure medications in the past but these have been discontinued as they have bottomed her out after several months.  Denies any shortness of breath, risk factors for PE including recent surgery, prolonged immobilization, hormonal treatments.  Has not had any syncopal episodes.  She called her primary care physician and was told to come to our emergency department for further evaluation given her chest tightness      Physical Exam   Triage Vital Signs: ED Triage Vitals  Encounter Vitals Group     BP 11/24/23 1351 (!) 173/112     Girls Systolic BP Percentile --      Girls Diastolic BP Percentile --      Boys Systolic BP Percentile --      Boys Diastolic BP Percentile --      Pulse Rate 11/24/23 1351 78     Resp 11/24/23 1351 17     Temp 11/24/23 1351 97.7 F (36.5 C)     Temp Source 11/24/23 1351 Oral     SpO2 11/24/23 1351 97 %     Weight 11/24/23 1356 210 lb (95.3 kg)     Height 11/24/23 1356 5' 5 (1.651 m)     Head Circumference --      Peak Flow --      Pain Score 11/24/23 1355 5     Pain Loc --      Pain Education --      Exclude from Growth Chart --     Most recent vital signs: Vitals:   11/24/23 1351  BP: (!) 173/112  Pulse: 78  Resp: 17  Temp: 97.7 F (36.5 C)  SpO2:  97%    Nursing Triage Note reviewed. Vital signs reviewed and patients oxygen saturation is normoxic  General: Patient is well nourished, well developed, awake and alert, resting comfortably in no acute distress Head: Normocephalic and atraumatic Eyes: Normal inspection, extraocular muscles intact, no conjunctival pallor Ear, nose, throat: Normal external exam Neck: Normal range of motion Respiratory: Patient is in no respiratory distress, lungs CTAB Cardiovascular: Patient is not tachycardic, RRR without murmur appreciated GI: Abd SNT with no guarding or rebound  Back: Normal inspection of the back with good strength and range of motion throughout all ext Extremities: pulses intact with good cap refills, no LE pitting edema or calf tenderness Neuro: The patient is alert and oriented to person, place, and time, appropriately conversive, with 5/5 bilat UE/LE strength, no gross motor or sensory defects noted. Coordination appears to be adequate. Skin: Warm, dry, and intact Psych: normal mood and affect, no SI or HI  ED Results / Procedures / Treatments   Labs (all labs ordered are listed, but only abnormal results are displayed) Labs Reviewed  BASIC METABOLIC PANEL WITH GFR  CBC  POC URINE PREG, ED  POC URINE PREG, ED  TROPONIN T, HIGH SENSITIVITY  TROPONIN T, HIGH SENSITIVITY     EKG EKG and rhythm strip are interpreted by myself:   EKG: [Normal sinus rhythm] at heart rate of 77, normal QRS duration, QTc 445, normal ST segments and T waves no ectopy EKG not consistent with Acute STEMI Rhythm strip: NSR in lead II   RADIOLOGY Xray chest: No acute abnormality on my independent review interpretation radiologist agrees    PROCEDURES:  Critical Care performed: No  Procedures   MEDICATIONS ORDERED IN ED: Medications - No data to display   IMPRESSION / MDM / ASSESSMENT AND PLAN / ED COURSE                                Differential diagnosis includes, but is not  limited to, essential hypertension, atypical ACS, anemia, end-stage organ dysfunction including acute renal insufficiency, pleural edema, pregnancy   ED course: Patient arrives and she is very well-appearing.  EKG demonstrated no evidence of acute ischemia or heart strain.  Patient's blood pressure is elevated here today.  She has no evidence of endorgan dysfunction including no acute renal insufficiency or any pleural edema seen on chest x-ray.  I did consider PE however patient is not tachycardic and PERCs out.  Despite multiple days of symptoms, her high-sensitivity troponin was not elevated at all.  Urine pregnancy test was not elevated.  Patient states that she has tolerated amlodipine  in the past for several months and will initiate this.  I have written her for 30 days of this medication   She already has an appointment with her primary care physician scheduled for next week.  Given that she has this reported history of not tolerating blood pressure medications for a long and this chest tightness I have placed an outpatient cardiology consult.  All questions answered and patient voiced understanding and requested discharge   Clinical Course as of 11/24/23 2016  Fri Nov 24, 2023  1452 Troponin T High Sensitivity: <15 No elevated troponin [HD]  1452 CBC No anemia no leukocytosis [HD]  1452 Basic metabolic panel No elevated creatinine [HD]  1453 DG Chest 2 View Chest x-ray unremarkable [HD]  1527 POC pregnancy negative.  [HD]    Clinical Course User Index [HD] Nicholaus Rolland BRAVO, MD   -- Risk: 5 This patient has a high risk of morbidity due to further diagnostic testing or treatment. Rationale: This patient's evaluation and management involve a high risk of morbidity due to the potential severity of presenting symptoms, need for diagnostic testing, and/or initiation of treatment that may require close monitoring. The differential includes conditions with potential for significant  deterioration or requiring escalation of care. Treatment decisions in the ED, including medication administration, procedural interventions, or disposition planning, reflect this level of risk. COPA: 5 The patient has the following acute or chronic illness/injury that poses a possible threat to life or bodily function: [X] : The patient has a potentially serious acute condition or an acute exacerbation of a chronic illness requiring urgent evaluation and management in the Emergency Department. The clinical presentation necessitates immediate consideration of life-threatening or function-threatening diagnoses, even if they are ultimately ruled out.   FINAL CLINICAL IMPRESSION(S) / ED DIAGNOSES   Final diagnoses:  Hypertension, unspecified type  Chest pain, unspecified type     Rx / DC Orders   ED Discharge Orders  Ordered    amLODipine  (NORVASC ) 5 MG tablet  Daily        11/24/23 1505    Ambulatory referral to Cardiology       Comments: If you have not heard from the Cardiology office within the next 72 hours please call [phone].   11/24/23 1507             Note:  This document was prepared using Dragon voice recognition software and may include unintentional dictation errors.   Nicholaus Rolland BRAVO, MD 11/24/23 2016

## 2023-11-24 NOTE — Telephone Encounter (Signed)
 Update: pt had arrive to the ED per epic

## 2023-11-29 ENCOUNTER — Encounter: Payer: Self-pay | Admitting: Internal Medicine

## 2023-11-29 ENCOUNTER — Ambulatory Visit: Admitting: Internal Medicine

## 2023-11-29 VITALS — BP 156/96 | HR 93 | Temp 98.9°F | Ht 65.0 in | Wt 215.8 lb

## 2023-11-29 DIAGNOSIS — F331 Major depressive disorder, recurrent, moderate: Secondary | ICD-10-CM | POA: Diagnosis not present

## 2023-11-29 DIAGNOSIS — F419 Anxiety disorder, unspecified: Secondary | ICD-10-CM

## 2023-11-29 DIAGNOSIS — I10 Essential (primary) hypertension: Secondary | ICD-10-CM

## 2023-11-29 DIAGNOSIS — F339 Major depressive disorder, recurrent, unspecified: Secondary | ICD-10-CM | POA: Insufficient documentation

## 2023-11-29 MED ORDER — LOSARTAN POTASSIUM 25 MG PO TABS: 25.0000 mg | ORAL_TABLET | Freq: Every day | ORAL | 0 refills | Status: AC

## 2023-11-29 MED ORDER — SERTRALINE HCL 50 MG PO TABS: 50.0000 mg | ORAL_TABLET | Freq: Every day | ORAL | 3 refills | Status: AC

## 2023-11-29 NOTE — Patient Instructions (Signed)
  VISIT SUMMARY: Today, we discussed your ongoing issues with high blood pressure and associated symptoms, as well as your anxiety and depression. We reviewed your current medications and made some adjustments to better manage your conditions. We also talked about lifestyle factors and the importance of regular monitoring and follow-up.  YOUR PLAN: -HYPERTENSION: Hypertension means high blood pressure, which can lead to serious health problems if not managed properly. Your blood pressure has been fluctuating, and you have experienced symptoms like headaches, vision changes, and chest tightness. We have added losartan  to your current medication, amlodipine  5 mg, to help control your blood pressure. You should monitor your blood pressure at home regularly. We have also ordered blood work to check for any secondary causes of your hypertension. Please follow up in one month to assess your blood pressure control and kidney function.  -ANXIETY AND DEPRESSION: Anxiety and depression are mental health conditions that can affect your overall well-being and may contribute to your high blood pressure. Your anxiety and depression scores are elevated, and you have experienced increased symptoms recently. We have prescribed Zoloft  to help manage your anxiety and depression. It is important to take this medication as directed and to continue with your regular exercise and balanced diet.  INSTRUCTIONS: Please monitor your blood pressure at home regularly and record the readings. Follow up with Doctor Glendia in one month to assess your blood pressure control and kidney function. Additionally, take Zoloft  as prescribed for your anxiety and depression. If you experience any new or worsening symptoms, please contact our office immediately.                      Contains text generated by [employer]bridge.                                 Contains text generated by [employer]bridge.

## 2023-11-29 NOTE — Assessment & Plan Note (Signed)
-   Patient with history of generalized anxiety disorder and was previously on Zoloft  50 mg daily -Patient's GAD-7 score is 21 today -Patient states that her anxiety symptoms fluctuate and are higher now with her ongoing medical issues as well as the holiday coming up -We discussed possibly restarting her Zoloft  and patient is in agreement with this -Will start the patient on Zoloft  50 mg daily -Patient to follow-up in 1 month for reevaluation -No further workup at this time

## 2023-11-29 NOTE — Assessment & Plan Note (Signed)
-   This problem is chronic and worsening -Patient was previously on amlodipine  but stopped taking this last year as her blood pressures were starting to go low. -She was maintaining blood pressures in the 120s off of amlodipine  but over the last few weeks has noted increasing blood pressures up to the 170s -She was seen in urgent care and the emergency room and had blood work done as well as an EKG and was started on amlodipine  5 mg daily -Today has improved with systolics in the 150s -Patient denies any chest pain or shortness of breath currently.  Still complains of some mild headaches but this is much improved than before -Cardiac and respiratory exams were unremarkable - Will start the patient on losartan  25 mg daily and continue with amlodipine  5 mg daily for now -Patient to monitor blood pressures at home -If she does note her blood pressures are going low she will stop one of the medications -She will follow-up with Dr. Glendia or myself in 1 month for BMP as well as repeat blood pressure check -Given that patient is 28 years old and BMI is only mildly elevated at 35 I am concerned for possible secondary causes of hypertension.  She did have TSH done earlier this year which was within normal limits.  Her BMP and CBC were within normal limits -Will check a renin aldosterone level to rule out a secondary cause of hypertension -If patient has persistent hypertension may consider further evaluation for secondary causes like pheochromocytoma or renal artery stenosis.  Would also consider urine microalbumin to creatinine ratio -No further workup at this time

## 2023-11-29 NOTE — Progress Notes (Signed)
 Acute Office Visit  Subjective:     Patient ID: Sydney James, female    DOB: 07-10-1995, 28 y.o.   MRN: [id number]  Chief Complaint  Patient presents with   Acute Visit    Elevated BP 173/112 last week 156/96 on 11/29/23    Discussed the use of AI scribe software for clinical note transcription with the patient, who gave verbal consent to proceed.  History of Present Illness Sydney James is a 28 year old female with hypertension who presents with uncontrolled blood pressure and associated symptoms.  Hypertension and associated symptoms - Hypertension since first pregnancy in 2016, with fluctuating blood pressure over the years - Recent blood pressure readings elevated, reaching the 170s, with subsequent decrease to the 150s - Previously treated with amlodipine , which effectively lowered blood pressure, but medication discontinued once blood pressure stabilized, resulting in recurrent hypertensive episodes - Current symptoms during hypertensive episodes include severe headaches, vision changes, and chest tightness - Headache has improved; vision changes and chest pain have resolved, but persistent malaise remains - Recent urgent care and emergency room visits for significantly elevated blood pressure; emergency room blood pressure in the 170s - Home blood pressure monitoring performed sporadically, primarily when symptomatic - History of preeclampsia during pregnancies, with concern for ongoing contribution to current hypertension - Current antihypertensive regimen includes amlodipine  5 mg; previous trials of labetalol  and losartan , but not hydrochlorothiazide - Concern about being on multiple antihypertensive medications  Neuropsychiatric symptoms - Elevated anxiety and depression scores, fluctuating with lifestyle and current stressors - Recent increase in symptoms attributed to recent knee surgery and holiday-related stress - Previous effective treatment with Zoloft   for anxiety, but preference to avoid medication unless necessary  Lifestyle factors - Engages in regular exercise and maintains a balanced diet - Minimal intake of caffeine  and greasy foods    Review of Systems  Constitutional: Negative.   HENT: Negative.    Respiratory: Negative.    Cardiovascular: Negative.  Negative for chest pain, palpitations and leg swelling.  Gastrointestinal: Negative.   Musculoskeletal: Negative.   Neurological:  Positive for headaches.  Psychiatric/Behavioral: Negative.    This problem is      Objective:    BP (!) 156/96   Pulse 93   Temp 98.9 F (37.2 C)   Ht 5' 5 (1.651 m)   Wt 215 lb 12.8 oz (97.9 kg)   LMP 11/14/2023 (Exact Date)   SpO2 99%   BMI 35.91 kg/m    Physical Exam Constitutional:      Appearance: Normal appearance.  HENT:     Head: Normocephalic and atraumatic.  Cardiovascular:     Rate and Rhythm: Normal rate and regular rhythm.     Heart sounds: Normal heart sounds.  Pulmonary:     Effort: Pulmonary effort is normal.     Breath sounds: Normal breath sounds. No wheezing, rhonchi or rales.  Abdominal:     General: Bowel sounds are normal. There is no distension.     Palpations: Abdomen is soft.     Tenderness: There is no abdominal tenderness. There is no guarding or rebound.  Musculoskeletal:        General: No swelling or tenderness.     Right lower leg: No edema.     Left lower leg: No edema.  Neurological:     Mental Status: She is alert.  Psychiatric:        Mood and Affect: Mood normal.  Behavior: Behavior normal.     No results found for any visits on 11/29/23.      Assessment & Plan:   Problem List Items Addressed This Visit       Cardiovascular and Mediastinum   Essential hypertension   - This problem is chronic and worsening -Patient was previously on amlodipine  but stopped taking this last year as her blood pressures were starting to go low. -She was maintaining blood pressures in the  120s off of amlodipine  but over the last few weeks has noted increasing blood pressures up to the 170s -She was seen in urgent care and the emergency room and had blood work done as well as an EKG and was started on amlodipine  5 mg daily -Today has improved with systolics in the 150s -Patient denies any chest pain or shortness of breath currently.  Still complains of some mild headaches but this is much improved than before -Cardiac and respiratory exams were unremarkable - Will start the patient on losartan  25 mg daily and continue with amlodipine  5 mg daily for now -Patient to monitor blood pressures at home -If she does note her blood pressures are going low she will stop one of the medications -She will follow-up with Dr. Glendia or myself in 1 month for BMP as well as repeat blood pressure check -Given that patient is 28 years old and BMI is only mildly elevated at 35 I am concerned for possible secondary causes of hypertension.  She did have TSH done earlier this year which was within normal limits.  Her BMP and CBC were within normal limits -Will check a renin aldosterone level to rule out a secondary cause of hypertension -If patient has persistent hypertension may consider further evaluation for secondary causes like pheochromocytoma or renal artery stenosis.  Would also consider urine microalbumin to creatinine ratio -No further workup at this time      Relevant Medications   losartan  (COZAAR ) 25 MG tablet   Other Relevant Orders   Aldosterone:renin ratio     Other   Anxiety - Primary   - Patient with history of generalized anxiety disorder and was previously on Zoloft  50 mg daily -Patient's GAD-7 score is 21 today -Patient states that her anxiety symptoms fluctuate and are higher now with her ongoing medical issues as well as the holiday coming up -We discussed possibly restarting her Zoloft  and patient is in agreement with this -Will start the patient on Zoloft  50 mg  daily -Patient to follow-up in 1 month for reevaluation -No further workup at this time      Relevant Medications   sertraline  (ZOLOFT ) 50 MG tablet   Depression, major, recurrent   - Patient has a history of depression in the past and has been on Zoloft  for anxiety as well as depression -Her PHQ-9 score today is elevated at 15 -Will start the patient on Zoloft  50 mg daily and have her follow-up in 1 month for reevaluation -No further workup at this time      Relevant Medications   sertraline  (ZOLOFT ) 50 MG tablet    Meds ordered this encounter  Medications   losartan  (COZAAR ) 25 MG tablet    Sig: Take 1 tablet (25 mg total) by mouth daily.    Dispense:  90 tablet    Refill:  0   sertraline  (ZOLOFT ) 50 MG tablet    Sig: Take 1 tablet (50 mg total) by mouth daily.    Dispense:  30 tablet  Refill:  3    No follow-ups on file.  Fidel Caggiano, MD

## 2023-11-29 NOTE — Assessment & Plan Note (Signed)
-   Patient has a history of depression in the past and has been on Zoloft  for anxiety as well as depression -Her PHQ-9 score today is elevated at 15 -Will start the patient on Zoloft  50 mg daily and have her follow-up in 1 month for reevaluation -No further workup at this time

## 2023-12-07 ENCOUNTER — Ambulatory Visit: Payer: Self-pay | Admitting: Internal Medicine

## 2023-12-07 LAB — ALDOSTERONE + RENIN ACTIVITY W/ RATIO
Aldos/Renin Ratio: 4.5 (ref 0.0–30.0)
Aldosterone: 13.7 ng/dL (ref 0.0–30.0)
Renin Activity, Plasma: 3.018 ng/mL/h (ref 0.167–5.380)

## 2023-12-07 NOTE — Telephone Encounter (Signed)
 Copied from CRM #[id number]. Topic: Clinical - Lab/Test Results >> Dec 07, 2023  1:08 PM Ashley R wrote: Reason for CRM: Read back lab results note verbatim. No further questions.

## 2023-12-21 NOTE — Progress Notes (Deleted)
°  Cardiology Office Note   Date:  12/21/2023  ID:  Sydney James, DOB 13-Nov-1995, MRN [id number] PCP: Glendia Shad, MD  [facility] Providers Cardiologist:  None { Click to update primary MD,subspecialty MD or APP then REFRESH:1}    History of Present Illness Sydney James is a 28 y.o. female PMH HTN who presents for further evaluation management of chest discomfort.  Patient seen in the ED for this issue 11/24/2023.  Was reportedly having high blood pressures and chest discomfort.  Troponin, BMP, CBC all unremarkable.  Last LDL 169 07/2022.  Relevant CVD History -Normal renin-aldosterone assay 11/2023 - Monitor 10/2017 mean heart rate 82 bpm, occasional PVCs, rare PACs, no arrhythmias - TTE 09/2017 normal biventricular function, no valvular disease   ROS: Pt denies any chest discomfort, jaw pain, arm pain, palpitations, syncope, presyncope, orthopnea, PND, or LE edema.  Studies Reviewed I have independently reviewed the patient's ECG, previous cardiac testing, recent medical records, recent blood work.  Physical Exam VS:  There were no vitals taken for this visit.       Wt Readings from Last 3 Encounters:  11/29/23 215 lb 12.8 oz (97.9 kg)  11/24/23 210 lb (95.3 kg)  11/09/23 210 lb 14.4 oz (95.7 kg)    GEN: No acute distress. NECK: No JVD; No carotid bruits. CARDIAC: ***RRR, no murmurs, rubs, gallops. RESPIRATORY:  Clear to auscultation. EXTREMITIES:  Warm and well-perfused. No edema.  ASSESSMENT AND PLAN Chest discomfort Hypertension HLD        {Are you ordering a CV Procedure (e.g. stress test, cath, DCCV, TEE, etc)?   Press F2        :[id number]}  Dispo: ***  Signed, Caron Poser, MD

## 2023-12-25 ENCOUNTER — Ambulatory Visit

## 2023-12-28 NOTE — Progress Notes (Deleted)
" °  Cardiology Office Note   Date:  12/28/2023  ID:  BRITTIANY WIEHE, DOB 1995/09/30, MRN [id number] PCP: Glendia Shad, MD  [facility] Providers Cardiologist:  None { Click to update primary MD,subspecialty MD or APP then REFRESH:1}    History of Present Illness Sydney James is a 28 y.o. female PMH HTN who presents for further evaluation management of chest discomfort.  Patient seen in the ED for this issue 11/24/2023.  Was reportedly having high blood pressures and chest discomfort.  Troponin, BMP, CBC all unremarkable.  Last LDL 169 07/2022.  Relevant CVD History -Normal renin-aldosterone assay 11/2023 - Monitor 10/2017 mean heart rate 82 bpm, occasional PVCs, rare PACs, no arrhythmias - TTE 09/2017 normal biventricular function, no valvular disease   ROS: Pt denies any chest discomfort, jaw pain, arm pain, palpitations, syncope, presyncope, orthopnea, PND, or LE edema.  Studies Reviewed I have independently reviewed the patient's ECG, previous cardiac testing, recent medical records, recent blood work.  Physical Exam VS:  There were no vitals taken for this visit.       Wt Readings from Last 3 Encounters:  11/29/23 215 lb 12.8 oz (97.9 kg)  11/24/23 210 lb (95.3 kg)  11/09/23 210 lb 14.4 oz (95.7 kg)    GEN: No acute distress. NECK: No JVD; No carotid bruits. CARDIAC: ***RRR, no murmurs, rubs, gallops. RESPIRATORY:  Clear to auscultation. EXTREMITIES:  Warm and well-perfused. No edema.  ASSESSMENT AND PLAN Chest discomfort Hypertension HLD        {Are you ordering a CV Procedure (e.g. stress test, cath, DCCV, TEE, etc)?   Press F2        :[id number]}  Dispo: ***  Signed, Caron Poser, MD  "

## 2024-01-08 ENCOUNTER — Ambulatory Visit

## 2024-01-08 ENCOUNTER — Ambulatory Visit: Admitting: Internal Medicine

## 2024-01-08 DIAGNOSIS — I10 Essential (primary) hypertension: Secondary | ICD-10-CM

## 2024-01-15 ENCOUNTER — Ambulatory Visit: Admitting: Internal Medicine

## 2024-01-21 NOTE — Progress Notes (Unsigned)
 " Cardiology Office Note   Date:  01/22/2024  ID:  Sydney James, DOB Dec 03, 1995, MRN [id number] PCP: Glendia Shad, MD  [facility] Providers Cardiologist:  Caron Poser, MD     History of Present Illness Sydney James is a 29 y.o. female PMH HTN who presents for further evaluation management of chest discomfort.  Patient seen in the ED for this issue 11/24/2023.  Was reportedly having high blood pressures and chest discomfort.  Troponin, BMP, CBC all unremarkable.  Last LDL 169 07/2022. TSH normal 03/2023.  Patient reports that she continues to have intermittent chest discomfort.  She reports she is able to do medium grade exercise without symptoms, but she will notice the chest discomfort as she tries to do things like go up a flight of steps.  The chest discomfort sometimes happens at rest as well.  She also reports somewhat labile blood pressures.  She has a strong family history of premature ASCVD on her father side.  Last cholesterol was quite high.  Relevant CVD History - Normal renin-aldosterone assay 11/2023 - Monitor 10/2017 mean heart rate 82 bpm, occasional PVCs, rare PACs, no arrhythmias - TTE 09/2017 normal biventricular function, no valvular disease   ROS: Pt denies any jaw pain, arm pain, palpitations, syncope, presyncope, orthopnea, PND, or LE edema.  Studies Reviewed I have independently reviewed the patient's ECG, previous cardiac testing, recent medical records, recent blood work.  Physical Exam VS:  BP 118/72 (BP Location: Right Arm, Patient Position: Sitting, Cuff Size: Normal)   Pulse 74   Ht 5' 5.5 (1.664 m)   Wt 210 lb 12.8 oz (95.6 kg)   SpO2 97%   BMI 34.55 kg/m        Wt Readings from Last 3 Encounters:  01/22/24 210 lb 12.8 oz (95.6 kg)  11/29/23 215 lb 12.8 oz (97.9 kg)  11/24/23 210 lb (95.3 kg)    GEN: No acute distress. NECK: No JVD; No carotid bruits. CARDIAC: RRR, no murmurs, rubs, gallops. RESPIRATORY:  Clear to  auscultation. EXTREMITIES:  Warm and well-perfused. No edema.  ASSESSMENT AND PLAN Chest discomfort Patient presents with chest discomfort that occurs with exertion.  She has a strong family history of premature ASCVD in her father side of the family.  Her cholesterol is also quite elevated, do wonder about familial hypercholesterolemia.  Further stratification is indicated.  Plan: - Coronary CT angiogram to rule out obstructive CAD and to evaluate for plaque burden given family history and probable underlying FH - Echocardiogram to evaluate for structural etiologies - Further plans pending results  Paroxysmal tachycardia Palpitations Intermittent palpitations and paroxysmal tachycardia.  The chest discomfort also sometimes occurs at rest, which could be compatible with arrhythmia.  Possible sleep apnea as well.  Plan: - Monitor to evaluate for arrhythmia - Echocardiogram as above  Hypertension Labile hypertension.  Appears well-controlled in office today.  She notes this has been an issue since she had preeclampsia.  She also reports history of snoring, so sleep apnea could be contributory.  She had normal renin-aldosterone assay 11/2023.  Plan: - Continue amlodipine  5 mg daily, continue losartan  25 mg daily - This issue is of lower urgency than her chest pain currently, so we will further stratify this at a following appointment.  We will discuss obtaining sleep apnea evaluation and renal artery ultrasound as part of secondary hypertension workup at that time.  HLD Family history of premature CAD Last LDL was 169 07/2022.  Approaching FH levels.  This may also explain her strong family history of premature ASCVD events on her father side.  Plan: - Recheck lipids today; if LDL continues to increase, then we will initiate lipid-lowering treatment - If her numbers are compatible with FH, we will have her see lipid clinic consider genetic testing since she does have 3 children        Dispo: RTC 3 months or sooner as needed  Signed, Caron Poser, MD  "

## 2024-01-22 ENCOUNTER — Encounter: Payer: Self-pay | Admitting: Internal Medicine

## 2024-01-22 ENCOUNTER — Ambulatory Visit

## 2024-01-22 VITALS — BP 118/72 | HR 74 | Ht 65.5 in | Wt 210.8 lb

## 2024-01-22 DIAGNOSIS — R002 Palpitations: Secondary | ICD-10-CM

## 2024-01-22 DIAGNOSIS — Z8249 Family history of ischemic heart disease and other diseases of the circulatory system: Secondary | ICD-10-CM | POA: Diagnosis not present

## 2024-01-22 DIAGNOSIS — Z79899 Other long term (current) drug therapy: Secondary | ICD-10-CM

## 2024-01-22 DIAGNOSIS — R0789 Other chest pain: Secondary | ICD-10-CM | POA: Insufficient documentation

## 2024-01-22 DIAGNOSIS — I479 Paroxysmal tachycardia, unspecified: Secondary | ICD-10-CM | POA: Diagnosis not present

## 2024-01-22 DIAGNOSIS — I10 Essential (primary) hypertension: Secondary | ICD-10-CM | POA: Diagnosis not present

## 2024-01-22 DIAGNOSIS — E782 Mixed hyperlipidemia: Secondary | ICD-10-CM

## 2024-01-22 DIAGNOSIS — R079 Chest pain, unspecified: Secondary | ICD-10-CM | POA: Diagnosis not present

## 2024-01-22 DIAGNOSIS — R072 Precordial pain: Secondary | ICD-10-CM

## 2024-01-22 MED ORDER — METOPROLOL TARTRATE 100 MG PO TABS: ORAL_TABLET | ORAL | 0 refills | Status: AC

## 2024-01-22 NOTE — Patient Instructions (Addendum)
 Medication Instructions:  Your physician recommends that you continue on your current medications as directed. Please refer to the Current Medication list given to you today.  *If you need a refill on your cardiac medications before your next appointment, please call your pharmacy*  Lab Work: Your provider would like for you to have following labs drawn today LIPID PANEL, BMP.   If you have labs (blood work) drawn today and your tests are completely normal, you will receive your results only by: MyChart Message (if you have MyChart) OR A paper copy in the mail If you have any lab test that is abnormal or we need to change your treatment, we will call you to review the results.  Testing/Procedures:  ECHOCARDIOGRAM:  Your physician has requested that you have an echocardiogram. Echocardiography is a painless test that uses sound waves to create images of your heart. It provides your doctor with information about the size and shape of your heart and how well your hearts chambers and valves are working.   You may receive an ultrasound enhancing agent through an IV if needed to better visualize your heart during the echo. This procedure takes approximately one hour.  There are no restrictions for this procedure.  This will take place at [address] [facility] [address], [place] [address]  Please note: We ask at that you not bring children with you during ultrasound (echo/ vascular) testing. Due to room size and safety concerns, children are not allowed in the ultrasound rooms during exams. Our front office staff cannot provide observation of children in our lobby area while testing is being conducted. An adult accompanying a patient to their appointment will only be allowed in the ultrasound room at the discretion of the ultrasound technician under special circumstances. We apologize for any inconvenience.   CORONARY CT SCAN:   Your cardiac CT will be scheduled at one of the  below locations:   [facility] [address] [place], [place] [address] [phone]  If scheduled at [facility] or [facility], please arrive 15 mins early for check-in and test prep.  There is spacious parking and easy access to the radiology department from the [facility] entrance. Please enter here and check-in with the desk attendant.   Please follow these instructions carefully (unless otherwise directed):  An IV will be required for this test and Nitroglycerin will be given.   On the Night Before the Test: Be sure to Drink plenty of water. Do not consume any caffeinated/decaffeinated beverages or chocolate 12 hours prior to your test. Do not take any antihistamines 12 hours prior to your test.  On the Day of the Test: Drink plenty of water until 1 hour prior to the test. Do not eat any food 1 hour prior to test. You may take your regular medications prior to the test.  Take metoprolol  (Lopressor ) two hours prior to test. FEMALES- please wear underwire-free bra if available, avoid dresses & tight clothing  After the Test: Drink plenty of water. After receiving IV contrast, you may experience a mild flushed feeling. This is normal. On occasion, you may experience a mild rash up to 24 hours after the test. This is not dangerous. If this occurs, you can take Benadryl  25 mg, Zyrtec, Claritin, or Allegra and increase your fluid intake. (Patients taking Tikosyn should avoid Benadryl , and may take Zyrtec, Claritin, or Allegra) If you experience trouble breathing, this can be  serious. If it is severe call 911 IMMEDIATELY. If it is mild, please call our office.  We will call to schedule your test 2-4 weeks out understanding that some insurance companies will need an authorization prior to the service being performed.   For more information and frequently asked questions, please visit our website :  [email or web address]  For non-scheduling related questions, please contact the cardiac imaging nurse navigator should you have any questions/concerns: Cardiac Imaging Nurse Navigators Direct Office Dial: [phone]   For scheduling needs, including cancellations and rescheduling, please call Brittany, [phone].      ZIO XT- Long Term Monitor Instructions  Your physician has requested you wear a ZIO patch monitor for 14 days.  This is a single patch monitor. [employer] supplies one patch monitor per enrollment. Additional stickers are not available. Please do not apply patch if you will be having a Nuclear Stress Test, Echocardiogram, Cardiac CT, MRI, or Chest Xray during the period you would be wearing the monitor. The patch cannot be worn during these tests. You cannot remove and re-apply the ZIO XT patch monitor.  Your ZIO patch monitor will be mailed 3 day USPS to your address on file. It may take 3-5 days to receive your monitor after you have been enrolled. Once you have received your monitor, please review the enclosed instructions. Your monitor has already been registered assigning a specific monitor serial number to you.  Billing and Patient Assistance Program Information  We have supplied [employer] with any of your insurance information on file for billing purposes.  [employer] offers a sliding scale Patient Assistance Program for patients that do not have insurance, or whose insurance does not completely cover the cost of the ZIO monitor.  You must apply for the Patient Assistance Program to qualify for this discounted rate.  To apply, please call [employer] at [phone], select option 4, select option 2, ask to apply for Patient Assistance Program. Meredeth will ask your household income, and how many people are in your household. They will quote your out-of-pocket cost based on that information. [employer] will also be able to set up a 48-month, interest-free payment plan if  needed.  Applying the monitor   Hold abrader disc by orange tab. Rub abrader in 40 strokes over the upper left chest as indicated in your monitor instructions.  Clean area with 4 enclosed alcohol pads. Let dry.  Apply patch as indicated in monitor instructions. Patch will be placed under collarbone on left side of chest with arrow pointing upward.  Rub patch adhesive wings for 2 minutes. Remove white label marked 1. Remove the white label marked 2. Rub patch adhesive wings for 2 additional minutes.  While looking in a mirror, press and release button in center of patch. A small green light will flash 3-4 times. This will be your only indicator that the monitor has been turned on.   After Applying Monitor: Do not shower for the first 24 hours. You may shower after the first 24 hours. (Keep your back toward water; monitor cannot be submerged in water) Press the button if you feel a symptom. You will hear a small click. Record Date, Time and Symptom in the Patient Logbook.   After Completing 14 Days: When you are ready to remove the patch, follow instructions on the last 2 pages of Patient Logbook.  Stick patch monitor into the tabs at the bottom of the return box.  Place Patient Logbook in the blue and white box. Use locking tab  on box and tape box closed securely. The blue and white box has prepaid postage on it. Please place it in the mailbox as soon as possible. Your physician should have your test results approximately 7-14 days after the monitor has been mailed back to [facility].   Troubleshooting: Call [facility] at [phone] if you have questions regarding your ZIO XT patch monitor.  Call them immediately if you see an orange light blinking on your monitor.  If your monitor falls off in less than 4 days, contact our Monitor department at [phone].  If your monitor becomes loose or falls off after 4 days call [employer] at [phone] for suggestions on  securing your monitor.   Follow-Up: At [facility], you and your health needs are our priority.  As part of our continuing mission to provide you with exceptional heart care, our providers are all part of one team.  This team includes your primary Cardiologist (physician) and Advanced Practice Providers or APPs (Physician Assistants and Nurse Practitioners) who all work together to provide you with the care you need, when you need it.  Your next appointment:  3 month(s)  Provider:  Caron Poser, MD   We recommend signing up for the patient portal called MyChart.  Sign up information is provided on this After Visit Summary.  MyChart is used to connect with patients for Virtual Visits (Telemedicine).  Patients are able to view lab/test results, encounter notes, upcoming appointments, etc.  Non-urgent messages can be sent to your provider as well.   To learn more about what you can do with MyChart, go to [email or web address].

## 2024-01-23 ENCOUNTER — Ambulatory Visit: Payer: Self-pay

## 2024-01-23 LAB — BASIC METABOLIC PANEL WITH GFR
BUN/Creatinine Ratio: 17 (ref 9–23)
BUN: 14 mg/dL (ref 6–20)
CO2: 23 mmol/L (ref 20–29)
Calcium: 9.8 mg/dL (ref 8.7–10.2)
Chloride: 104 mmol/L (ref 96–106)
Creatinine, Ser: 0.84 mg/dL (ref 0.57–1.00)
Glucose: 101 mg/dL — ABNORMAL HIGH (ref 70–99)
Potassium: 4.7 mmol/L (ref 3.5–5.2)
Sodium: 142 mmol/L (ref 134–144)
eGFR: 96 mL/min/1.73

## 2024-01-23 LAB — LIPID PANEL
Chol/HDL Ratio: 4.9 ratio — ABNORMAL HIGH (ref 0.0–4.4)
Cholesterol, Total: 215 mg/dL — ABNORMAL HIGH (ref 100–199)
HDL: 44 mg/dL
LDL Chol Calc (NIH): 145 mg/dL — ABNORMAL HIGH (ref 0–99)
Triglycerides: 143 mg/dL (ref 0–149)
VLDL Cholesterol Cal: 26 mg/dL (ref 5–40)

## 2024-02-02 ENCOUNTER — Ambulatory Visit

## 2024-02-14 ENCOUNTER — Ambulatory Visit: Admitting: Internal Medicine

## 2024-04-02 ENCOUNTER — Encounter: Admitting: Internal Medicine

## 2024-04-22 ENCOUNTER — Ambulatory Visit
